# Patient Record
Sex: Male | Born: 1935 | Race: Black or African American | Hispanic: No | Smoking: Former smoker
Health system: Southern US, Community
[De-identification: ages and names within clinical notes are randomized; demographics above are authoritative.]

## PROBLEM LIST (undated history)

## (undated) DIAGNOSIS — M1990 Unspecified osteoarthritis, unspecified site: Secondary | ICD-10-CM

## (undated) DIAGNOSIS — I639 Cerebral infarction, unspecified: Secondary | ICD-10-CM

## (undated) DIAGNOSIS — I10 Essential (primary) hypertension: Secondary | ICD-10-CM

## (undated) DIAGNOSIS — I6529 Occlusion and stenosis of unspecified carotid artery: Secondary | ICD-10-CM

## (undated) DIAGNOSIS — E785 Hyperlipidemia, unspecified: Secondary | ICD-10-CM

## (undated) DIAGNOSIS — F0390 Unspecified dementia without behavioral disturbance: Secondary | ICD-10-CM

## (undated) DIAGNOSIS — I739 Peripheral vascular disease, unspecified: Secondary | ICD-10-CM

## (undated) DIAGNOSIS — N400 Enlarged prostate without lower urinary tract symptoms: Secondary | ICD-10-CM

## (undated) DIAGNOSIS — K429 Umbilical hernia without obstruction or gangrene: Secondary | ICD-10-CM

## (undated) DIAGNOSIS — R1310 Dysphagia, unspecified: Secondary | ICD-10-CM

## (undated) HISTORY — DX: Benign prostatic hyperplasia without lower urinary tract symptoms: N40.0

## (undated) HISTORY — DX: Dysphagia, unspecified: R13.10

## (undated) HISTORY — DX: Hyperlipidemia, unspecified: E78.5

## (undated) HISTORY — DX: Peripheral vascular disease, unspecified: I73.9

## (undated) HISTORY — DX: Occlusion and stenosis of unspecified carotid artery: I65.29

## (undated) HISTORY — DX: Umbilical hernia without obstruction or gangrene: K42.9

## (undated) HISTORY — DX: Unspecified osteoarthritis, unspecified site: M19.90

---

## 2001-06-07 ENCOUNTER — Encounter: Payer: Self-pay | Admitting: Family Medicine

## 2001-06-07 ENCOUNTER — Encounter: Admission: RE | Admit: 2001-06-07 | Discharge: 2001-06-07 | Payer: Self-pay | Admitting: Family Medicine

## 2006-12-29 HISTORY — PX: PEG TUBE PLACEMENT: SUR1034

## 2007-06-05 ENCOUNTER — Inpatient Hospital Stay (HOSPITAL_COMMUNITY): Admission: EM | Admit: 2007-06-05 | Discharge: 2007-06-08 | Payer: Self-pay | Admitting: Emergency Medicine

## 2007-06-07 ENCOUNTER — Encounter (INDEPENDENT_AMBULATORY_CARE_PROVIDER_SITE_OTHER): Payer: Self-pay | Admitting: Neurology

## 2007-06-07 ENCOUNTER — Ambulatory Visit: Payer: Self-pay | Admitting: Physical Medicine & Rehabilitation

## 2007-06-08 ENCOUNTER — Inpatient Hospital Stay (HOSPITAL_COMMUNITY)
Admission: RE | Admit: 2007-06-08 | Discharge: 2007-06-11 | Payer: Self-pay | Admitting: Physical Medicine & Rehabilitation

## 2007-06-15 ENCOUNTER — Encounter (HOSPITAL_COMMUNITY)
Admission: RE | Admit: 2007-06-15 | Discharge: 2007-07-15 | Payer: Self-pay | Admitting: Physical Medicine & Rehabilitation

## 2007-07-20 ENCOUNTER — Encounter (HOSPITAL_COMMUNITY)
Admission: RE | Admit: 2007-07-20 | Discharge: 2007-08-19 | Payer: Self-pay | Admitting: Physical Medicine & Rehabilitation

## 2007-07-21 ENCOUNTER — Ambulatory Visit: Payer: Self-pay | Admitting: Physical Medicine & Rehabilitation

## 2007-07-21 ENCOUNTER — Encounter
Admission: RE | Admit: 2007-07-21 | Discharge: 2007-09-28 | Payer: Self-pay | Admitting: Physical Medicine & Rehabilitation

## 2007-09-15 ENCOUNTER — Emergency Department (HOSPITAL_COMMUNITY): Admission: EM | Admit: 2007-09-15 | Discharge: 2007-09-15 | Payer: Self-pay | Admitting: Emergency Medicine

## 2007-09-23 ENCOUNTER — Ambulatory Visit: Payer: Self-pay | Admitting: *Deleted

## 2007-10-06 ENCOUNTER — Encounter (INDEPENDENT_AMBULATORY_CARE_PROVIDER_SITE_OTHER): Payer: Self-pay | Admitting: *Deleted

## 2007-10-06 ENCOUNTER — Ambulatory Visit: Payer: Self-pay | Admitting: *Deleted

## 2007-10-06 ENCOUNTER — Inpatient Hospital Stay (HOSPITAL_COMMUNITY): Admission: RE | Admit: 2007-10-06 | Discharge: 2007-10-19 | Payer: Self-pay | Admitting: Vascular Surgery

## 2007-10-06 HISTORY — PX: CAROTID ENDARTERECTOMY: SUR193

## 2007-11-16 ENCOUNTER — Ambulatory Visit (HOSPITAL_COMMUNITY): Admission: RE | Admit: 2007-11-16 | Discharge: 2007-11-16 | Payer: Self-pay | Admitting: *Deleted

## 2007-12-02 ENCOUNTER — Ambulatory Visit: Payer: Self-pay | Admitting: *Deleted

## 2008-06-15 ENCOUNTER — Ambulatory Visit: Payer: Self-pay | Admitting: *Deleted

## 2008-12-14 ENCOUNTER — Ambulatory Visit: Payer: Self-pay | Admitting: *Deleted

## 2009-07-20 ENCOUNTER — Ambulatory Visit: Payer: Self-pay | Admitting: Vascular Surgery

## 2009-11-08 ENCOUNTER — Encounter: Payer: Self-pay | Admitting: Cardiology

## 2009-12-13 ENCOUNTER — Ambulatory Visit: Payer: Self-pay | Admitting: Surgery

## 2011-01-22 ENCOUNTER — Ambulatory Visit: Admit: 2011-01-22 | Payer: Self-pay | Admitting: Vascular Surgery

## 2011-01-22 ENCOUNTER — Ambulatory Visit
Admission: RE | Admit: 2011-01-22 | Discharge: 2011-01-22 | Payer: Self-pay | Source: Home / Self Care | Attending: Vascular Surgery | Admitting: Vascular Surgery

## 2011-01-23 NOTE — Assessment & Plan Note (Signed)
OFFICE VISIT  Robert Barnett, Robert Barnett DOB:  1932/02/11                                       01/22/2011 [id number]  I saw this patient in the office today for continued follow-up of his carotid disease and peripheral vascular disease.  This is a patient who Dr. Amedeo Plenty performed a right carotid endarterectomy on in October of 2008.  I last saw him in December of 2010.  Since I saw him last, he has had no history of stroke, TIAs, expressive or receptive aphasia, or amaurosis fugax.  He denies any history of claudication, rest pain, or nonhealing ulcers.  PAST MEDICAL HISTORY:  Significant for hypertension and hypercholesterolemia.  He had a stroke prior to his right carotid endarterectomy.  SOCIAL HISTORY:  He is widowed.  He has 6 children.  He has a remote history of tobacco use.  REVIEW OF SYSTEMS:  CARDIOVASCULAR:  He has had no chest pain, chest pressure, palpitations or arrhythmias.  He had no significant dyspnea on exertion or orthopnea.  He had no history of DVT or phlebitis. PULMONARY:  He had no productive cough bronchitis, asthma or wheezing. MUSCULOSKELETAL:  He does have a history of arthritis.  PHYSICAL EXAMINATION:  This is a pleasant 77 year old gentleman who appears his stated age.  Blood pressure is 104/68, heart rate is 54, saturation 96%.  Lungs:  Clear bilaterally to auscultation without rales, rhonchi or wheezing.  Cardiovascular exam:  I do not detect any carotid bruits.  He has a regular rate and rhythm.  He has palpable femoral pulses and warm well-perfused feet.  He has no significant lower extremity swelling.  Abdomen:  Soft and nontender with normal pitched bowel sounds.  Neurologic exam:  He has no focal weakness or paresthesias.  I did independently interpret his carotid duplex scan which shows that his right carotid endarterectomy site is widely patent without evidence of restenosis.  He has no significant stenosis on the  left.  Both vertebral arteries are patent with only directed flow.  I have also independently interpreted his arterial Doppler study which shows an ABI of 69% on the right which is stable compared to his last study and a normal ABI of 100% on the left.  He has biphasic Doppler signals in the left foot with monophasic Doppler signals in the right foot.  I have ordered a follow-up carotid duplex scan in 1 year and I will plan on seeing him back in 2 years unless there is any change in his follow- up study in a year.  We will also continue to follow his infrainguinal arterial occlusive disease on the right, however, currently he is asymptomatic.  He knows to call sooner if he has problems.    Judeth Cornfield. Scot Dock, M.D. Electronically Signed  CSD/MEDQ  D:  01/22/2011  T:  01/23/2011  Job:  [id number]

## 2011-01-31 NOTE — Procedures (Unsigned)
CAROTID DUPLEX EXAM  INDICATION:  Follow up right carotid endarterectomy.  HISTORY: Diabetes:  No. Cardiac:  No. Hypertension:  Yes. Smoking:  Previous. Previous Surgery:  Right carotid endarterectomy on 10/06/2007 by Dr. Amedeo Plenty. CV History:  History of right hemispheric stroke. Amaurosis Fugax No, Paresthesias No, Hemiparesis No                                      RIGHT             LEFT Brachial systolic pressure:         113               124 Brachial Doppler waveforms:         Normal            Normal Vertebral direction of flow:        Antegrade         Antegrade DUPLEX VELOCITIES (cm/sec) CCA peak systolic                   77                [id number] ECA peak systolic                   55                49 ICA peak systolic                   64                87 ICA end diastolic                   9                 18 PLAQUE MORPHOLOGY:                                    Heterogeneous PLAQUE AMOUNT:                      None              Mild PLAQUE LOCATION:                                      ICA  IMPRESSION: 1. Patent right carotid endarterectomy site with no right internal     carotid artery stenosis. 2. No hemodynamically significant stenosis of the left internal     carotid artery with plaque formations as described above. 3. No significant change noted when compared to the previous exam on     12/13/2009.  ___________________________________________ Judeth Cornfield. Scot Dock, M.D.  CH/MEDQ  D:  01/23/2011  T:  01/23/2011  Job:  [id number]

## 2011-05-13 NOTE — Op Note (Signed)
Robert Barnett, Robert Barnett NO.:  [id number]   MEDICAL RECORD NO.:  [id number]          PATIENT TYPE:  INP   LOCATION:  [id number]                         FACILITY:  [facility]   PHYSICIAN:  Dorothea Glassman, M.D.    DATE OF BIRTH:  1932/08/25   DATE OF PROCEDURE:  10/06/2007  DATE OF DISCHARGE:                               OPERATIVE REPORT   SURGEON:  P Cameron Sprang, MD   ASSISTANT:  Judeth Cornfield. Scot Dock, M.D.   ANESTHETIC:  General endotracheal.   ANESTHESIOLOGIST:  Lorrene Reid, M.D.   PREOPERATIVE DIAGNOSIS:  1. Moderate to severe right internal carotid artery stenosis.  2. Status post nondisabling right hemispheric cerebrovascular      accident.   POSTOPERATIVE DIAGNOSIS:  (same)   CLINICAL NOTE:  Robert Barnett is a 77 year old male who suffered a non  disabling cerebrovascular accident in June of this year.  Initially  presented with aphasia and left hemiparesis.  Recovered extremely well  from this with a functional recovery including speech and ambulation.  He does have an MRI positive for stroke in his right brain.  Workup  revealed a moderate to severe right internal carotid artery stenosis.  He has undergone an arteriogram which verified extensive plaque in his  right carotid bifurcation with a moderate to severe stenosis.  He was  seen in consultation and was recommended he undergo right carotid  endarterectomy for reduction of stroke risk.  He consented for surgery.  Brought to the operating at this time for planned procedure.  Potential  risks of the operative procedure have been reviewed with the patient and  his family with a major morbidity mortality 1-2% to include but not  limited MI, CVA, cranial nerve injury and death.   OPERATIVE PROCEDURE:  The patient brought to the operating room stable  hemodynamic condition.  Placed under general endotracheal anesthesia.  Right neck prepped and draped in sterile fashion.  Foley catheter and  arterial line in  place.   Curvilinear skin incision made along the anterior border of the right  sternomastoid muscle.  Dissection carried down through subcutaneous  tissue with electrocautery.  Deep dissection carried down through the  platysma.  Further dissection carried along the anterior border of the  sternomastoid to expose the common carotid artery.  Common carotid  artery freed and encircled with vessel loop at the omohyoid muscle.  The  vagus nerve reflected posteriorly and preserved.  The common carotid  artery mobilized up to the origin of the superior thyroid and external  carotid which were freed and encircled with vessel loops.  The internal  carotid artery was severely diseased with plaque.  A large plaque was  noted at the origin of right internal carotid artery.  This was freed  and mobilized up to the posterior belly of the digastric muscle.  The  hypoglossal nerve was retracted superiorly and preserved.  The distal  internal carotid artery encircled with vessel loop. The plaque extended  quite high in the right ICA.   The patient administered 7000 units heparin intravenously.  Adequate  circulation time permitted.  The carotid vessels controlled with clamps.  Longitudinal arteriotomy made in the distal common carotid artery.  The  arteriotomy extended across the carotid bulb up to the internal carotid  artery.  There was recent hemorrhage in the plaque.  Extensive plaque at  the origin of right internal carotid artery with a moderate-to-severe  stenosis.  Shunt was then inserted.   An endarterectomy elevator used to remove the plaque.  The  endarterectomy carried down to the common carotid artery and plaque was  cut transversely with Potts scissors.  Plaque then raised up in the bulb  and the superior thyroid and external carotid were endarterectomized  using an eversion technique.  The distal internal carotid artery plaque  feathered out well.  The fragments plaque removed with  plaque forceps.  The site irrigated with heparin saline solution and Dextran.  A patch  angioplasty endarterectomy site carried out with a running 6-0 Prolene  suture using a Finesse Dacron patch.  At completion of the patch  angioplasty shunt was removed.  All vessels well flushed.  Clamps  removed directing initial antegrade flow up the external carotid artery.  Following this the internal carotid was released.   Excellent pulse and Doppler signal in the distal internal carotid  artery.   Adequate hemostasis obtained.  Sponge and instrument counts correct.  The patient administered 50 mg protamine intravenously.   Sternomastoid fascia closed with running 2-0 Vicryl suture.  Platysma  closed with running 3-0 Vicryl suture.  Skin closed with 4-0 Monocryl.  Dermabond applied.   Anesthesia reversed in the operating room.  The patient awakened  readily.  No apparent complications.  Transferred to recovery room in  stable condition.      Dorothea Glassman, M.D.  Electronically Signed     PGH/MEDQ  D:  10/06/2007  T:  10/07/2007  Job:  [id number]   cc:   Pramod P. Leonie Man, MD  Delphina Cahill, M.D.

## 2011-05-13 NOTE — Procedures (Signed)
CAROTID DUPLEX EXAM   INDICATION:  Followup carotid artery disease.   HISTORY:  Diabetes:  No.  Cardiac:  No.  Hypertension:  Yes.  Smoking:  Previous.  Previous Surgery:  Yes.  Right carotid endarterectomy with Dacron patch  on 10/06/2007 by Dr. Amedeo Plenty.  CV History:  Yes, previous CVA.  Amaurosis Fugax No, Paresthesias No, Hemiparesis No                                       RIGHT             LEFT  Brachial systolic pressure:  Brachial Doppler waveforms:  Vertebral direction of flow:        Antegrade         Antegrade  DUPLEX VELOCITIES (cm/sec)  CCA peak systolic                   95                [id number]  ECA peak systolic                   76                56  ICA peak systolic                   89                [id number]  ICA end diastolic                   24                30  PLAQUE MORPHOLOGY:                                    Heterogeneous  PLAQUE AMOUNT:                                        Mild  PLAQUE LOCATION:                                      ICA   IMPRESSION:  1. No right internal carotid artery stenosis status post carotid      endarterectomy.  2. 0%-39% stenosis of the left internal carotid artery.  3. Antegrade flow in bilateral vertebrals.   ___________________________________________  Judeth Cornfield. Scot Dock, M.D.   CB/MEDQ  D:  12/13/2009  T:  12/13/2009  Job:  [id number]

## 2011-05-13 NOTE — Op Note (Signed)
NAMERIGDON, RECCHIA NO.:  [id number]   MEDICAL RECORD NO.:  [id number]          PATIENT TYPE:  INP   LOCATION:  2039                         FACILITY:  [facility]   PHYSICIAN:  Jeryl Columbia, M.D.    DATE OF BIRTH:  28-Nov-1932   DATE OF PROCEDURE:  10/15/2007  DATE OF DISCHARGE:                               OPERATIVE REPORT   PROCEDURE:  EGD with PEG placement.   INDICATIONS:  CVA with decreased swallowing.  Consent was signed after  risks, benefits, methods, options thoroughly discussed yesterday with  multiple family members and the patient.   MEDICINES USED:  Fentanyl 75 mcg, Versed 7.5 mg.   PROCEDURE:  The video endoscope was inserted by direct vision following  the feeding tube into his esophagus.  The feeding tube was removed.  He  did have a small hiatal hernia.  The rest of the esophagus was normal.  The scope was inserted into the stomach.  Some mild gastritis was seen.  The scope was advanced through a normal antrum and normal pylorus into a  normal duodenal bulb and around the C-loop to a normal second portion of  the duodenum.  The scope was withdrawn back to the stomach and  retroflexed.  The cardia, fundus, angularis, lesser and greater curve  were all normal on retroflex visualization.  Straight visualization of  the stomach did not reveal any additional findings.  We went ahead and  slowly withdrew back to 20 cm, again confirming a normal esophagus.  The  scope was then advanced to the distal greater curve.  The light reflex  was seen in the midepigastric area.  One-to-one finger palpation was  confirmed endoscopically.  While the abdomen was sterilely dressed and  draped, the snare was advanced through the scope.  Imogene Burn, our  P.A., injected 1% lidocaine to raise the customary wheal, made a small  less than a centimeter incision.  The introducer needle was advanced on  the first try into the stomach and grabbed with the snare.  The blue  Ponsky pull cord was advanced through the introducer, grabbed with the  snare, and the scope, snare, and cord were withdrawn through the  patient's mouth.  A 24-French [employer] Ponsky-stype pull peg  was attached to the cord and withdrawn through the abdominal incision in  the customary fashion to approximately the 4-cm mark.  While the bumper  was being placed, the scope was reinserted.  There was no trauma to the  esophagus.  The proper tension of the bumper and placement of the bumper  was confirmed endoscopically and by turning the PEG.  Air was suctioned,  the scope removed, and the dressing was applied.   The patient tolerated the procedure well.  There was no obvious  immediate complication.   ENDOSCOPIC DIAGNOSES:  1. Small hiatal hernia.  2. Minimal gastritis.  3. Otherwise normal esophagogastroduodenoscopy therapy.  24-French      [employer] pull peg placed without complications.   PLAN:  Customary post PEG orders.  Once he is at the goal rate and  continuous, would switch him to bolus which will probably be best for  him at home.  Will go ahead and add pump inhibitors.  Happy to see back  in the office p.r.n. when swallowing returns to decide the appropriate  timing of when to pull the PEG out.  Otherwise, happy to see back  periodically to check on the PEG or see sooner p.r.n. signs of problems.           ______________________________  Jeryl Columbia, M.D.     MEM/MEDQ  D:  10/15/2007  T:  10/17/2007  Job:  [id number]   cc:   Dorothea Glassman, M.D.  Delphina Cahill, M.D.  Pramod P. Leonie Man, MD

## 2011-05-13 NOTE — Assessment & Plan Note (Signed)
OFFICE VISIT   Robert Barnett, Robert Barnett  DOB:  08-27-1932                                       06/15/2008  [id number]   The patient returned to the office today for followup of his carotid  disease.  He had a right carotid endarterectomy performed in October of  2008 for nondisabling right hemispheric CVA.  This was complicated by  prolonged dysphagia with aspiration requiring PEG tube placement.  Fortunately, he developed complete recovery and his swallowing returned  to normal.  His PEG tube was removed and he has been doing well since  that time.   His carotid Dopplers reveal widely patent right carotid endarterectomy  site and minimal left carotid disease.  Mild left carotid plaque noted.  Antegrade vertebral flow present bilaterally.   The patient appears generally well.  He is alert and oriented.  BP is  125/73, pulse is 54 per minute.  No carotid bruits.  Cranial nerves  intact.  Strength equal bilaterally.  2+ reflexes.     His daughter has expressed some concern about his lower extremity  symptoms.  He does describe some calf claudication bilaterally.  Has no  night pain or rest pain.   His lower extremity Dopplers do reveal some disease in the right side  with ankle brachial index noted to be 0.71 on the right and 1 on the  left.  He has good biphasic tibial wave forms on the left and monophasic  on the right.   Highly likely the patient does have some iliac disease on the right,  symptoms are mild to moderate at present.  We will plan no further  intervention.  Plan to follow up with him in 6 months with repeat  carotid Doppler and lower extremity Dopplers.   Dorothea Glassman, M.D.  Electronically Signed   PGH/MEDQ  D:  06/15/2008  T:  06/16/2008  Job:  [id number]   cc:   Delphina Cahill, M.D.

## 2011-05-13 NOTE — Consult Note (Signed)
NEW PATIENT CONSULTATION   SIERRA, WENZLICK  DOB:  24-Aug-1932                                       09/23/2007  [id number]   REASON FOR CONSULTATION:  Moderate-to-severe right internal carotid  artery stenosis, status post right hemispheric cerebrovascular accident.   HISTORY:  Patient is a 77 year old African-American gentleman who was  admitted to [facility] on June 05, 2007 following the acute  onset of left hemiparesis and slurred speech.  Workup under code stroke  revealed a large right hemispheric infarct, and he was given two puff  doses of IV t-PA.  Angiography revealed an ulcerative plaque in the  proximal right internal carotid artery with fresh thrombus and a 60-70%  stenosis.  Patient received antiplatelet therapy with aspirin and  Plavix.  Made an excellent recovery and received inpatient rehab for  approximately a week.  He is now ambulatory.  He has minimal deficits.  He is independent in daily activities.   PAST MEDICAL HISTORY:  1. Hypertension.  2. Hyperlipidemia.  3. BPH.   MEDICATIONS:  1. Plavix 75 mg daily.  2. Aspirin 81 mg daily.  3. Zocor 20 mg daily.  4. Flomax 0.4 mg daily.  5. Chlorothiazide 25 mg daily.  6. Lotrel 10/20 mg daily.   ALLERGIES:  None known.   SOCIAL HISTORY:  The patient is retired.  He has six children.  He is a  widower.  He lives independently.  He does not consume alcohol or use  tobacco products.   REVIEW OF SYSTEMS:  Refer to patient encounter form.  No weight change.  He denies anorexia.  No fevers or chills.  No chest pain.  He denies  shortness of breath, cough, or sputum production.  No change in bowel  habits.  No dysuria or frequency.   FAMILY HISTORY:  Mother is living at age 41.  Father died at age 21,  little details of his death.  He has six siblings, one brother and five  sisters who are generally well.  No family history of diabetes, stroke,  or MI.   PHYSICAL  EXAMINATION:  A generally well-appearing African-American  gentleman.  Alert and oriented.  Vital signs:  BP 121/74 left arm,  130/73 right arm, pulse 66 per minute.  HEENT:  Mouth and throat clear.  Normocephalic.  Extraocular movements are intact.  Neck:  Supple.  No  thyromegaly or adenopathy.  Chest:  Clear with equal air entry  bilaterally.  No rales or rhonchi.  Cardiovascular:  Normal heart  sounds.  No murmurs.  No carotid bruits.  Abdomen is soft and nontender.  No masses or organomegaly.  Lower extremities:  DP 1+ bilaterally.  No  ankle edema.  Neurologic:  Patient is alert and oriented.  Speech is  clear.  Cranial nerves intact.  Strength equal bilaterally.  Gait is  slow with mild left foot dragging.   IMPRESSION:  1. Moderate-to-severe right internal carotid artery stenosis, status      post large right hemispheric cerebrovascular accident.  Good      functional recovery.  2. Hypertension.  3. Hyperlipidemia.  4. Benign prostatic hypertrophy.   RECOMMENDATIONS:  Right carotid endarterectomy for reduction of stroke  risk.  I have discussed my recommendations with the patient and the  details of surgery.  Risks and benefits of the  operative procedure  reviewed.  He is at high risk for recurrent stroke.  Potential risks of  the operative procedure, MI, CVA, cranial nerve injury, and death with a  rate of 1-2%.  Patient is hesitant to consent for surgery at this time.  He does wish to think about this and will discuss this further with his  family.  His daughter was with him today.  I have also spoken to his  daughter,  Freda Munro, in [place].  I will await their final decision.  I have given  them my card and a contact number for July Keck, scheduling nurse.   Dorothea Glassman, M.D.  Electronically Signed   PGH/MEDQ  D:  09/23/2007  T:  09/24/2007  Job:  [id number]   cc:   Biagio Borg, MD  Delphina Cahill, M.D.  Pramod P. Leonie Man, MD

## 2011-05-13 NOTE — Assessment & Plan Note (Signed)
OFFICE VISIT   Robert Barnett, Robert Barnett  DOB:  [birth year]                                       12/14/2008  [id number]   The patient underwent a right carotid endarterectomy in October 2008.  This was followed by prolonged dysphagia, requiring placement of a PEG  tube.  He developed complete recovery of his swallowing and now has no  complaints.  The PEG tube has been removed some time ago.   Presents at this time for routine carotid follow-up.  No complaints of  visual disturbance, sensory motor deficit.  No speech problems.   His right carotid endarterectomy site is widely patent by Doppler.  Mild  stenosis on the left side.   Lower extremity Dopplers were also carried out at this time, indicating  ABIs of 0.63 on the right and 0.81 on the left.  He has no leg pain with  ambulation.   PHYSICAL EXAMINATION:  The patient appears well.  Alert and oriented.  No acute distress.  Blood pressure is 140/70, pulse is 68 per minute.  No carotid bruits.  Cranial nerves intact.  Strength equal bilaterally.  Lower Extremities:  Reveal some psoriasis changes of the skin.  No  palpable pedal pulses.  Feet are well-perfused without ischemic changes.   Overall, the patient is doing well.  Will plan follow-up again in 1 year  with lower extremity Dopplers and a carotid Doppler.   Dorothea Glassman, M.D.  Electronically Signed   PGH/MEDQ  D:  12/14/2008  T:  12/15/2008  Job:  [id number]   cc:   Delphina Cahill, M.D.

## 2011-05-13 NOTE — Discharge Summary (Signed)
NAMEALVIE, Robert Barnett NO.:  [id number]   MEDICAL RECORD NO.:  [id number]          PATIENT TYPE:  INP   LOCATION:  2039                         FACILITY:  [facility]   PHYSICIAN:  Dorothea Glassman, M.D.    DATE OF BIRTH:  02-Oct-1932   DATE OF ADMISSION:  10/06/2007  DATE OF DISCHARGE:  10/19/2007                               DISCHARGE SUMMARY   ADMISSION DIAGNOSES:  1. Moderate to severe right internal carotid artery stenosis.  2. Status post nondisabling right hemispheric cerebrovascular      accident.   DISCHARGE DIAGNOSES/SECONDARY DIAGNOSES:  1. Moderate to severe right internal carotid artery stenosis.  2. Status post nondisabling right hemispheric cerebrovascular      accident.  3. Hypercholesterolemia with no history of diabetes.  4. Dysphagia, secondary to nerve rotation during right carotid      endarterectomy.  5. Hypertension.  6. Benign prostatic hypertrophy.  7. Umbilical hernia.  8. Right middle cerebral artery cerebrovascular accident, status post      tissue plasminogen activator.  9. Dyslipidemia.   CONSULTING PHYSICIAN:  Consults were with:  1. Speech therapy.  2. ENT:  Dr. Crissie Reese.  3. GI:  Dr. Clarene Essex.  4. Nutrition.   PROCEDURE:  1. On October 06, 2007, right carotid endarterectomy with Dacron patch      angioplasty by Dr. Gordy Clement.  2. On October 15, 2007, PEG placed by GI, Dr. Watt Climes.  3. On October 9, 13 and 16, 2008, FEES studies done.   HISTORY AND PHYSICAL:  This is [employer].  This is a 77 year old male  who suffered a nondisabling cerebrovascular accident in June of this  year.  Initially he presented with aphasia and left hemiparesis.  Recovered extremely well from this with a functional recovery including  speech and ambulation.  He does not have an MRI positive for stroke in  his right brain.  Workup revealed a moderate to severe right internal  carotid artery stenosis.  He has undergone an arteriogram which  verified  extensive plaque in his right carotid, bifurcation with a moderate to  severe stenosis.  He was seen in consultation and it was recommended he  undergo a right carotid endarterectomy for reduction of stroke.  He  consented for surgery.  Brought to operating room at this time for  planned procedure and he agreed to procedure.   HOSPITAL COURSE:  He was electively admitted on October 06, 2007 for a  right carotid endarterectomy.  Postoperative noticed he was having  dysarthria and dysphagia, felt that this was related to nerve retraction  during surgery as during the operative bonding showed plaque was very  high.  Speech therapy was consulted.  Three FEES studies failed even  though he was showing slight improvement with clearing secretions.  Short-term PEG placement because of dysphagia was placed by GI.  Prior  to PEG tube, ENT consultation and MRI with no evidence of acute stroke.  ENT and GI recommended a PEG because of the persistent dysphagia.  In  the meantime, until PEG placement was done on  October 15, 2007, tube  feeding was indicated via panda tube.  At time of dictation,  transitioning him to bolus tube feedings to goal rate.  Note patient  initially started on Osmolite, but because of hyperglycemia, started on  a NovoLog insulin sliding scale and changed to Jevity in hopes he will  have improved glucose control.  Ordered hemoglobin A1C, results still  pending.  Consult to follow and make further recommendations at  discharge if needed.  Currently the patient remains neurologically  intact except with previously mentioned dysphagia.  Vitals stable,  afebrile.  Most recent labs:  His white blood count is 8.5, his  hemoglobin 11.5, hematocrit 33.7, and his platelets are 240,000.  On his  sodium is 142, his chloride is 104, his BUN is at 12, his potassium is  at 3.4, bicarb at 31, creatinine at 1.12 and his glucose is at 123,  calcium 9.2.  Input is 840 and output is a  positive 300, and weight  109.6 kg.  CBG 156.  If no significant change in status and tolerating  bolus, plan to discharge on October 19, 2007 to home with home health  nursing care in place.  Currently the patient remains in stable  condition.   DISCHARGE MEDICATIONS:  1. Simvastatin 20 mg daily.  2. Hydrochlorothiazide 25 mg daily.  3. Aspirin 81 mg daily.  4. Flomax 0.4 mg.  5. Plavix 75 mg.  6. Amlodipine 10/20.  7. Protonix 20 mL daily by tube.  8. Jevity 1.2 kcal 340 mL five times daily.   DISCHARGE INSTRUCTIONS:  On discharge, the patient was instructed to  clean PEG site with soap and water daily.  Increase activity slowly.  Walk with assistance and walk up steps.  He may sponge bathe.  No  lifting or driving for two weeks.  He is to have a follow-up appointment  in one month with Dr. Gordy Clement.  He is to call Dr. Watt Climes at  telephone number [phone] if abdominal pain, redness or drainage from  PEG site.  Home health nursing with advanced home care is in place for  patient.  He is to continue medications per his home reconciliation  sheet and crush medications and give through the PEG tube.  He is to  have a follow-up FEES study on November 16, 2007 at 11:30 a.m. and he is  to please arrive in short-stay 15 minutes early.  He is to follow-up  with Dr. Gordy Clement in one month on November 11, 2007.  Currently  the patient remains in stable condition.      Darlin Coco, PA      P. Drucie Opitz, M.D.  Electronically Signed    ALW/MEDQ  D:  10/18/2007  T:  10/19/2007  Job:  [id number]

## 2011-05-13 NOTE — Discharge Summary (Signed)
NAMEDEVANTE, Robert Barnett NO.:  [id number]   MEDICAL RECORD NO.:  [id number]          PATIENT TYPE:  IPS   LOCATION:  4036                         FACILITY:  [facility]   PHYSICIAN:  Jarvis Morgan, M.D.   DATE OF BIRTH:  [birth year]   DATE OF ADMISSION:  06/08/2007  DATE OF DISCHARGE:  06/11/2007                               DISCHARGE SUMMARY   DISCHARGE DIAGNOSES:  1. Right middle cerebral artery infarction.  2. Right internal carotid artery stenosis.  3. Hypertension.  4. Hyperlipidemia.  5. Benign prostatic hypertrophy.  6. Subcutaneous Lovenox for deep vein thrombosis prophylaxis.   HISTORY:  A 77 year old left handed black male who was admitted June 7  with left-sided weakness and slurred speech.  Cranial CT scan was  negative.  TPA was initiated.  MRI showed a right middle cerebral artery  multi-infarction.  Cerebral arteriogram showed ulcerated plaque, right  internal carotid artery, associated stenosis of 65% to 70% that was  nonobstructive.  Dr. Estanislado Pandy of Interventional Radiology felt at  present no intervention was needed.  Echocardiogram was pending.  Neurology consulted, placed on aspirin and Plavix therapy.  Plan was for  outpatient evaluation for possible right carotid surgery.  Bouts of  bradycardia and Bystolic was stopped June 08, 2007.   PAST MEDICAL HISTORY:  See discharge diagnoses.   HABITS:  No alcohol or tobacco.   ALLERGIES:  None.   SOCIAL HISTORY:  Retired, widowed, lives alone, multiple family members  to assist as needed.   MEDICATIONS PRIOR TO ADMISSION:  1. Bystolic 5 mg daily.  2. Lotrel 10/20 daily.  3. Etodolac 40 mg daily.  4. Flomax 0.4 mg daily.  5. Aspirin 81 mg daily.  6. Hydrochlorothiazide 25 mg daily.   REHABILITATION HOSPITAL COURSE:  The patient was admitted to inpatient  rehab services with therapies initiated on a 3-hour-daily basis  consisting of physical therapy, occupational therapy, speech therapy and  rehabilitation nursing.  The following issues were addressed during the  patient's rehabilitation stay:  Pertaining Mr. Moctezuma's right cerebral  artery infarction, he remained on aspirin and Plavix therapy.  He was  now independent in his room.  He was advised no dressing and no driving.  The patient did receive tPA during his evaluation for cerebrovascular  accident.  Findings of right internal carotid artery stenosis of 65% to  70%; he would follow up with outpatient, Dr. Leonie Man, for evaluation and  workup for possible carotid surgery.  Blood pressures were monitored on  Lotrel and hydrochlorothiazide; his Bystolic had been discontinued due  to some bradycardia.  He had no chest pain or shortness of breath during  his therapies.  He was placed on Zocor for hyperlipidemia.  He remained  on his Flomax for benign prostatic hypertrophy.  Overall, for his  functional status, he was independent with handheld assistance for his  mobility, independent for activities of daily living, needing some  monitoring for balance.  Overall, his strength and endurance had greatly  improved.  He was encouraged with his overall progress and discharged to  home.   DISCHARGE MEDICATIONS  AT TIME OF DICTATION:  1. Plavix 75 mg daily.  2. Zocor 20 mg daily.  3. Aspirin 81 mg daily.  4. Flomax 0.4 mg at bedtime.  5. Hydrochlorothiazide 25 mg daily.  6. Lotrel 10/20 daily.   DIET:  Regular.   FOLLOWUP:  He would follow up with Dr. Jarvis Morgan at the outpatient  rehab service office as advised, Dr. Meade Maw, medical management,  Dr. Leonie Man for evaluation and completion for possible carotid surgery.      Lauraine Rinne, P.A.    ______________________________  Jarvis Morgan, M.D.    DA/MEDQ  D:  06/11/2007  T:  06/11/2007  Job:  [id number]   cc:   Delphina Cahill, M.D.  Antony Contras, M.D.

## 2011-05-13 NOTE — Assessment & Plan Note (Signed)
OFFICE VISIT   BACHIR, MABLE  DOB:  04-23-1932                                       12/02/2007  [id number]   The patient underwent a right carotid endarterectomy on 10/06/2007 at  [facility].  This was a technically difficult procedure due to  the high nature of the lesion and was complicated by dysphagia and voice  changes.  He had aspiration on swallowing test, requiring placement of a  G tube.   He underwent a swallowing evaluation prior to his visit today.  He is  now eating normally.  His voice has returned to normal.   BP 145/67, pulse 56, respirations 18.  Right neck incision healing  unremarkably.  Cranial nerves intact.  Strength equal bilaterally.   G tube was removed today.  No complications associated with this.   The patient is doing very well following his surgery.  I will plan to  follow up with him again in 6 months with a carotid Doppler evaluation.   Dorothea Glassman, M.D.  Electronically Signed   PGH/MEDQ  D:  12/02/2007  T:  12/03/2007  Job:  [id number]

## 2011-05-13 NOTE — H&P (Signed)
Robert Barnett, Robert Barnett NO.:  [id number]   MEDICAL RECORD NO.:  [id number]          PATIENT TYPE:  INP   LOCATION:  5123                         FACILITY:  [facility]   PHYSICIAN:  Jarvis Morgan, M.D.   DATE OF BIRTH:  03/29/1932   DATE OF ADMISSION:  06/05/2007  DATE OF DISCHARGE:                              HISTORY & PHYSICAL   PRIMARY CARE PHYSICIAN:  Delphina Cahill, M.D.   NEUROLOGIST:  Dr. Leonie Man.   HISTORY OF THE PRESENT ILLNESS:  The patient is a 77 year old left-  handed African American male with a history of hypertension.  He was  admitted 06/05/2007 with acute onset of left-sided weakness and slurred  speech.  Cranial CT was initially negative and t-PA was initiated.  MRI  studies showed multiple infarcts in the right middle cerebral artery  distribution.  Cerebral arteriogram showed ulcerative plaques in the  right internal carotid artery associated with stenosis of 65-70% which  was nonobstructive.  Dr. Simona Huh from Interventional Radiology said no  present intervention was necessary.  Echocardiogram is pending.  Dr.  Leonie Man saw the patient from Neurology and started the patient on Plavix  and aspirin.  The plan is for an outpatient evaluation for possible  right carotid surgery.  He has had bouts of bradycardia and his home  Bistolic medication was discontinued.  The patient was evaluated by the  Rehabilitation physicians and felt to be an appropriate candidate for  inpatient rehabilitation.   REVIEW OF SYSTEMS:  Positive for reflux.   PAST MEDICAL HISTORY:  1. Hypertension.  2. Benign prostatic hypertrophy.  3. Umbilical hernia.   FAMILY HISTORY:  Positive for coronary artery disease.   SOCIAL HISTORY:  The patient is widowed and retired.  He lives alone but  has multiple family members who can assist him at home as necessary.  He  lives in a 1-level home.  He does not use alcohol or tobacco.   FUNCTIONAL HISTORY:  Prior to admission,  independent.   ALLERGIES:  NO KNOWN DRUG ALLERGIES.   MEDICATIONS PRIOR TO ADMISSION:  Bistolic 5 mg, Lotrel [id number] 1 q.day,  etodolac 40 mg q.day, Flomax 0.4 mg q.day, aspirin 81 mg q.day,  hydrochlorothiazide 25 mg q.day.   LABORATORY:  Recent hemoglobin was 14.1 with hematocrit of 42, platelet  count of 146,000 and a white count of 8.9.  Recent hemoglobin A1C was  6.5.  Total cholesterol was 167.  Sodium was 140, potassium 3.9,  chloride 111, CO2 25, BUN 12 and creatinine 1.01.   PHYSICAL EXAM:  GENERAL:  Well-appearing large adult male lying in bed  in no acute discomfort.  VITAL SIGNS:  Blood pressure 139/67 with a pulse of 50, respiratory rate  18 and temperature 98.2.  HEENT:  Normocephalic, not traumatic.  CARDIOVASCULAR:  Regular rate and rhythm.  S1-S2 without murmurs.  ABDOMEN:  Soft, obese, nontender with positive bowel sounds.  LUNGS:  Clear to auscultation bilaterally.  NEUROLOGIC:  Alert and oriented times 3.  Cranial nerves II through XII  intact.  Right upper and right lower extremity strength were 5/5  throughout.  Bulk and tone were normal.  Sensation was intact to light  touch throughout the right upper and right lower extremity.  Left upper  extremity strength was approximately 4-/5 and left lower extremity  strength was 4-/5 with intact sensation and normal bulk and tone.   IMPRESSION:  1. Status post right middle cerebral artery infarct with left-sided      weakness, arm and leg.  2. Right internal carotid artery stenosis with further direct      visualization for carotid endarterectomy as an outpatient.  3. Dyslipidemia.  4. Hypertension.  5. Benign prostatic hypertrophy.   Presently, the patient has deficits in ADL's, transfers, ambulation and  higher level cognition related to his above-noted right middle cerebral  artery multiple infarcts.   PLAN:  Admit to the rehabilitation unit for daily therapies to include  physical therapy for range of motion,  strength, bed mobility, transfers,  pre-gait training, gait training and equipment eval.  Occupational  therapy for range of motion, strengthening, ADL's, cognitive/perceptual  training, splinting and equipment eval.  Rehab nursing for skin care,  wound care and bowel and bladder training as necessary.  Speech therapy  for higher-level communication.  Case management to assess home  environment and assist with discharge planning and arrange for  appropriate follow-up care.  Social worker to assess family and social  support and assist in discharge planning.  Continue regular diet.  Check  CBG's, a.c. and nightly and discontinue if within normal limits.  Continue aspirin 81 mg p.o. q.day along with Plavix 75 mg p.o. q.day.  Sub-cu Lovenox 40 mg q.day for DVT prophylaxis.  Monitor hypertension on  Norvasc 10 mg p.o. q.day, Lotensin 20 mg p.o. q.day, and  hydrochlorothiazide 25 mg p.o. q.day.  Continue Flomax 0.4 mg p.o.  nightly for urinary retention and hesitancy.  Continue Zocor 20 mg p.o.  q.day for dyslipidemia.  DC IV heparin if not already done.  DC IV  fluids if not already done.  Routine turning to prevent skin breakdown.  Dulcolax suppository 1 per rectum q.day p.r.n.  Check admission labs  including CBC and CMET Wednesday, 06/09/2007.   PROGNOSIS:  Good.   ESTIMATED LENGTH OF STAY:  Seven to 10 days.   GOALS:  Modified independent, ADL's, transfers and ambulation.           ______________________________  Jarvis Morgan, M.D.     DC/MEDQ  D:  06/08/2007  T:  06/08/2007  Job:  [id number]

## 2011-05-13 NOTE — Procedures (Signed)
CAROTID DUPLEX EXAM   INDICATION:  Follow-up evaluation on known carotid artery disease.   HISTORY:  Diabetes:  No.  Cardiac:  No.  Hypertension:  Yes.  Smoking:  No.  Previous Surgery:  Right carotid endarterectomy with Dacron patch  angioplasty on October 06, 2007, by Dr. Amedeo Plenty.  CV History:  Patient reports no cerebrovascular symptoms at this time.  No right ICA stenosis status post endarterectomy.  A 20-39% left ICA  stenosis.  Amaurosis Fugax No, Paresthesias No, Hemiparesis No.                                       RIGHT             LEFT  Brachial systolic pressure:         155               151  Brachial Doppler waveforms:         Triphasic         Triphasic  Vertebral direction of flow:        Antegrade         Antegrade  DUPLEX VELOCITIES (cm/sec)  CCA peak systolic                   55                93  ECA peak systolic                   47                49  ICA peak systolic                   28                81  ICA end diastolic                   7                 17  PLAQUE MORPHOLOGY:                  None              Mixed  PLAQUE AMOUNT:                      None              Mild  PLAQUE LOCATION:                    None              Proximal ICA   IMPRESSION:  1. No right ICA stenosis status post endarterectomy.  2. A 20-39% left ICA stenosis.   ___________________________________________  P. Drucie Opitz, M.D.   MC/MEDQ  D:  12/14/2008  T:  12/14/2008  Job:  [id number]

## 2011-05-13 NOTE — Procedures (Signed)
CAROTID DUPLEX EXAM   INDICATION:  Followup of known carotid artery disease.  The patient is  currently asymptomatic.   HISTORY:  Diabetes:  No.  Cardiac:  No.  Hypertension:  Yes.  Smoking:  No.  Previous Surgery:  Right CEA with DPA on 10/06/2007 by Dr. Amedeo Plenty.  CV History:  Right hemispheric CVA with residual left-sided numbness.  Amaurosis Fugax No, Paresthesias No, Hemiparesis No                                       RIGHT             LEFT  Brachial systolic pressure:         122               130  Brachial Doppler waveforms:         Biphasic          Biphasic  Vertebral direction of flow:        Antegrade         Antegrade  DUPLEX VELOCITIES (cm/sec)  CCA peak systolic                   75                56  ECA peak systolic                   193               54  ICA peak systolic                   70 (D)            70  ICA end diastolic                   20                17  PLAQUE MORPHOLOGY:                  N/A               Heterogenous  PLAQUE AMOUNT:                      N/A               Mild  PLAQUE LOCATION:                    ECA               ICA   IMPRESSION:  1. Right ICA without recurrent stenosis status post CEA.  2. Left 1-39% ICA stenosis.  3. Right ECA stenosis.  4. Bilateral antegrade flow in vertebral arteries.   ___________________________________________  P. Drucie Opitz, M.D.   PB/MEDQ  D:  06/15/2008  T:  06/15/2008  Job:  [id number]

## 2011-05-13 NOTE — H&P (Signed)
NAMENATHANUAL, RASK NO.:  [id number]   MEDICAL RECORD NO.:  [id number]          PATIENT TYPE:  EMS   LOCATION:  MAJO                         FACILITY:  [facility]   PHYSICIAN:  Flint Melter. Jacolyn Reedy, M.D.DATE OF BIRTH:  [birth year]   DATE OF ADMISSION:  06/05/2007  DATE OF DISCHARGE:                              HISTORY & PHYSICAL   Code stroke was called at 3:36 p.m.  The patient's symptom onset was at  2:30 p.m., and time of the neuro initial exam was 4 p.m.   CHIEF COMPLAINT:  Left sided weakness and slurred speech, onset about  2:30 p.m.   HISTORY OF PRESENT ILLNESS:  Robert Barnett is a 77 year old, left-handed,  black male with a history of hypertension, who lives alone and is  independent.  This morning, he told his daughter that he felt funny, but  nonfocal, no specific abnormalities.  He went to his grandson's  graduation, and at one point complained of tingling of both hands, but  once again, nothing specific or unilateral.  Later this afternoon, he  was leaving the house to go a barbeque with his daughters when he  suddenly developed a left hemiparesis and slurred speech, and 911 was  called.   REVIEW OF SYSTEMS:  Positive for the tingling of the hands.  There is no  chest pain, no shortness of breath.  He had no GI or GU complaints.  He  had complained that his legs were tired.  No endocrine, reproductive,  hematologic, allergic, immunologic or psychiatric symptoms were  reported.   PAST MEDICAL HISTORY:  Significant for:  1. Hypertension.  2. Obesity.  3. Cirrhoses.  4. Umbilical hernia.  5. Benign prosthetic hypertrophy.   No previous history of stroke.  No history of diabetes or  hyperlipidemia.  He gets his care at [facility]   MEDICATIONS:  1. Bystolic 5 mg 1 a day.  2. Lotrel 10/20 one a day.  3. Etodolac 40 mg, I am not sure how many times a day.  4. Tylenol as needed.  5. Flomax 0.4 mg once a day.  6.  Hydrochlorothiazide 25 mg.  7. Aspirin once a day, I do not know if it was 81 or 325.   ALLERGIES:  No known drug allergies.   SOCIAL HISTORY:  He is a retired Programme researcher, broadcasting/film/video.  Lives by  himself.  Has several children.  He does not smoke or use alcohol, and  no illicit drug use.   FAMILY HISTORY:  Positive for hypertension.  Unknown for stroke.   OBJECTIVE:  VITAL SIGNS:  Systolic blood pressure has been running 115-  120, I do not have any diastolics reported.  Heart rate is in the 70s,  respirations in the 20s.  HEENT:  Head is normocephalic, atraumatic.  NECK:  Supple without bruits.  HEART:  Regular rate and rhythm.  LUNGS:  Clear to auscultation.  ABDOMEN:  Showed an umbilical hernia.  SKIN:  Cool and dry.  NEUROLOGIC:  He was awake and alert, but confused with some language  deficits.  Head  and eye deviation to the right with a left homonymous  hemianopsia and gaze deficit, left facial droop.  Left upper extremity  weakness, about 2/5.  All other extremities 5/5.  Deep tendon reflexes  1+ with downgoing toes.  Finger-to-nose normal on the right.  He could  not do it on the left.  Heel-to-shin normal bilaterally.  Sensation was  decreased on the left.  On the NIH stroke scale, he scored a total of  13.  Level consciousness:  He was alert; scored a zero.  Answered  questions 1 out of 2 correctly, did not get the month correct out of 1.  Obeyed 2 out of 2 commands; gets a zero.  His horizontal gaze:  He had a  forced deviation to the right with a left gaze palsy; he got a 2.  Visual fields:  He had a left homonymous hemianopsia; got a 2 for that.  Facial palsy and minor weakness on the left; got a 1 for that.  Left  arm:  He got a 3, no effort against gravity on the first evaluation.  Right arm:  No drift; got a zero.  Both legs:  No drift; got zero.  Ataxia was absent; got a zero.  Sensory decreased on the left; got a 1.  Best language showed a mild to moderate aphasia;  gets a 1.  Dysarthria:  Mild to moderate dysarthria; gets a 1.  Extinction:  Partial neglect on  the left.  Total score of 13.   CT of the brain was negative for any acute abnormalities.  No  hemorrhage.  NIH stroke scale, therefore, was 13.  Modified ranked scale  was zero; he is completely independent.  CBG is 154.  CT of the head was  negative.  INR was 1.0.  Hemoglobin 14.9, hematocrit 44.7, white cell  count 8.3, platelets 171.   ASSESSMENT:  Left hemiparesis with left homonymous hemianopsia and  neglect, gaze deviation suspicious for a right middle cerebral artery  infarct.  CT was negative.  Due to the time frame after discussion with  family, we will give 2/3 IV TPA and attempt an angiographic retrieval of  clot, if a clot can be found.  Risks and benefits were explained to the  patient's daughters.  He will then have an inpatient workup and physical  therapy and rehab.      Catherine A. Jacolyn Reedy, M.D.  Electronically Signed     CAW/MEDQ  D:  06/05/2007  T:  06/05/2007  Job:  [id number]

## 2011-05-13 NOTE — Assessment & Plan Note (Signed)
DATE OF FOLLOW UP:  July 23, 2007.   Robert Barnett returns to clinic today accompanied by his daughter.  Overall  the patient is doing well.  He is a 77 year old left handed African-  American male who was admitted June 05, 2007 with acute onset of left-  sided weakness and slurred speech.  Cranial CT was negative and tPA was  initiated. MRI study showed right middle super artery infarction.  Cerebral arteriogram showed ulcerated plaque of the right internal  carotid artery associated with 65-70% stenosis that was non obstructed.  Dr. Estanislado Pandy from interventional radiology thought that no intervention  was needed.  The patient was admitted to the rehabilitation unit June 08, 2007 and remained there through discharge.   The patient is about to finish all therapy in [place].  He is  independent with ambulation and has started driving.  He is also working  in his garden and ambulates without any assisted device.  He is  independent with bathing and dressing and continent of bowel and  bladder.  He has been back to see Dr. Nevada Crane his primary care physician  with no changes made.  His family initially had cared for him at their  home but they have now allowed him to return to his own home where he  lives independently.  They are providing meals that he warms up and eats  on a regular basis.  He has apparently changed his diet according to his  daughter.   REVIEW OF SYSTEMS:  Noncontributory.   MEDICATIONS:  1. Plavix 75 mg daily.  2. Zocor 20 mg daily.  3. Aspirin 81 mg daily.  4. Flomax 0.4 mg q.h.s.  5. Hydrochlorothiazide 25 mg daily.  6. Lotrel 10/20 one tablet daily.   PHYSICAL EXAMINATION:  Well-appearing large adult male in no acute  discomfort.  Blood pressure 108/73 with a pulse 51, respiratory rate 18  and O2 saturation 95% on room air.  He has 5 minus/5 strength throughout  bilateral upper and lower extremities.  Bulk and tone were normal and  reflexes were 2+ and  symmetrical.  Sensation was intact to light touch  with bilateral upper and lower extremities.  The patient ambulates  without any assisted device.   IMPRESSION:  1. Status post right middle cerebral artery infarction.  2. Right internal carotid artery stenosis, nonobstructive.  3. Hypertension.  4. Dyslipidemia.  5. History of benign prostatic hypertrophy.   In the office today no adjustment to medicines was made.  Will plan on  seeing the patient in followup on an as needed basis.  He will be  finishing outpatient therapy over the next couple of visits.  He is  independent with bathing and dressing, ambulation, driving, and actually  does some work in his home garden.  Will plan on seeing him in followup  on an as needed basis with followup with his primary care physician as  previously arranged.          ______________________________  Robert Barnett, M.D.    DC/MedQ  D:  07/23/2007 11:12:50  T:  07/23/2007 19:18:11  Job #:  [id number]

## 2011-05-13 NOTE — Assessment & Plan Note (Signed)
OFFICE VISIT   WINDEL, TENOLD  DOB:  1932/12/06                                       12/13/2009  [id number]   I saw the patient in the office today for continued followup of his  carotid disease and peripheral vascular disease.  This is a patient that  Dr. Amedeo Plenty had been following.  Dr. Amedeo Plenty has relocated to [place] so  I was picking up this patient.  This patient underwent a right carotid  endarterectomy in October of 2008 after he had sustained a right  hemispheric stroke associated with left-sided weakness.  Postoperatively  he had developed some swallowing problems and required a feeding tube  for 6 weeks.  Ultimately his swallowing has improved and he has had no  further problems with this.  He is left with only some mild residual  paresthesias and weakness in the left hand.  He has had no new  neurologic symptoms.  Specifically he has had no new onset weakness or  paresthesias, no expressive or receptive aphasia and no amaurosis fugax.   He also has some known peripheral vascular disease, however, really he  currently has no claudication, rest pain or history of nonhealing  ulcers.  His activity is fairly limited, however.   In addition, he has a history of hypertension and hypercholesterolemia,  both of which are stable on his current medications.   PAST MEDICAL HISTORY:  Otherwise unremarkable.  He denies any history of  diabetes, history of previous myocardial infarction, history of  congestive heart failure or history of COPD.   FAMILY HISTORY:  His mother died with heart failure.  He is unaware of  any history of premature cardiovascular disease.   SOCIAL HISTORY:  He is widowed.  He has 5 daughters and 1 son.  He quit  tobacco 20 years ago.  He does not drink alcohol on a regular basis.   MEDICATIONS:  1. Are Zocor 20 mg p.o. daily.  2. Plavix 75 mg p.o. daily.  3. Aspirin 81 mg p.o. daily.  4. Flomax 0.4 mg p.o. daily.  5.  Hydrochlorothiazide 25 mg p.o. daily.  6. Lotrel 10/20 mg p.o. daily.  7. Cod liver oil.   ALLERGIES:  No known drug allergies.   REVIEW OF SYSTEMS:  GENERAL:  He has had no recent weight loss, weight  gain or problems with his appetite.  CARDIOVASCULAR:  He has had no chest pain, chest pressure, palpitations  or arrhythmias.  He denies significant dyspnea on exertion.  He has had  no claudication, rest pain or nonhealing ulcers.  He has had the  previous right hemispheric stroke as described above.  He has had no  history of DVT or phlebitis.  GU:  He does have urinary frequency.  MUSCULOSKELETAL:  He does have some mild low back pain which he  attributes to arthritis.  Pulmonary, GI, neurologic, psychiatric, ENT, hematologic review of  systems is unremarkable.  SKIN:  He does state that he has had psoriasis on his back for years.   PHYSICAL EXAMINATION:  General:  This is a pleasant 77 year old  gentleman who appears his stated age.  Vital signs:  Blood pressure is  123/77, heart rate is 66, temperature 98.  HEENT:  Pupils are equal,  round and reactive to light and accommodation.  Extraocular motions are  intact.  Conjunctivae are normal.  Neck:  Neck is supple.  There is no  jugular venous distention.  Lungs:  Are clear bilaterally to  auscultation without rales, rhonchi or wheezing.  Cardiovascular:  I do  not detect any carotid bruits.  He has a regular rate and rhythm without  murmur or gallop appreciated.  He has no significant peripheral edema.  He has palpable radial and left femoral pulses bilaterally.  There is no  right femoral pulse.  I cannot palpate popliteal or pedal pulses.  Abdomen:  Soft and nontender.  I do not appreciate any masses.  I cannot  palpate an aneurysm.  He has normal pitched bowel sounds.  Musculoskeletal:  There are no major deformities or cyanosis.  Neurologic:  He has minimal weakness to the grip on the left but  otherwise good strength in the  upper extremities and lower extremities  bilaterally with no paresthesias.  Skin:  He does have psoriasis on his  back which is chronic.  Lymphatic:  He has no significant cervical,  axillary or inguinal lymphadenopathy.   Arterial Doppler study in our office today showed monophasic Doppler  signals in the right foot with an ABI of 68%.  He has biphasic signals  in the left foot with an ABI of 95%.   The patient also had a carotid duplex scan which I independently  interpreted and this shows no evidence of recurrent stenosis on the  right and no significant left carotid stenosis.  Both vertebral arteries  are patent with normally directed flow.   With respect to his carotid disease currently he has no evidence of  recurrent stenosis on the right with no significant left carotid  stenosis.  I think a yearly followup duplex is indicated.  We will see  him back at that time.  He does know to continue taking his aspirin and  let us know if he develops any new neurologic symptoms.   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  He has good flow in the left.  As he is asymptomatic  I would not recommend any further workup for his iliac artery occlusive  disease on the right unless he developed symptoms.  Will check followup  ABIs in 1 year.  Again, he knows to call sooner if his symptoms change.   Judeth Cornfield. Scot Dock, M.D.  Electronically Signed   CSD/MEDQ  D:  12/13/2009  T:  12/14/2009  Job:  [id number]
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
## 2011-10-08 ENCOUNTER — Other Ambulatory Visit (HOSPITAL_COMMUNITY): Payer: Self-pay | Admitting: Nephrology

## 2011-10-08 DIAGNOSIS — N289 Disorder of kidney and ureter, unspecified: Secondary | ICD-10-CM

## 2011-10-08 LAB — BASIC METABOLIC PANEL
CO2: 31
Calcium: 8.8
Creatinine, Ser: 1.02
Creatinine, Ser: 1.09
GFR calc non Af Amer: >60
GFR calc non Af Amer: >60
Glucose, Bld: 116 — ABNORMAL HIGH
Glucose, Bld: 141 — ABNORMAL HIGH
Potassium: 3.1 — ABNORMAL LOW
Potassium: 3.3 — ABNORMAL LOW
Potassium: 3.4 — ABNORMAL LOW

## 2011-10-08 LAB — HEMOGLOBIN A1C
Hgb A1c MFr Bld: 6.1
Mean Plasma Glucose: 140

## 2011-10-08 LAB — CBC
HCT: 33.7 — ABNORMAL LOW
MCV: 90.3
MCV: 92.5
Platelets: 240
RBC: 3.73 — ABNORMAL LOW
RBC: 3.99 — ABNORMAL LOW
RDW: 13.4
RDW: 13.6
WBC: 8.5
WBC: 9.3

## 2011-10-08 LAB — TYPE AND SCREEN: ABO/RH(D): A POS

## 2011-10-09 LAB — CBC
Hemoglobin: 14.1
MCHC: 34
MCV: 93
Platelets: 138 — ABNORMAL LOW
Platelets: 174
RDW: 13.8
WBC: 11.2 — ABNORMAL HIGH
WBC: 11.3 — ABNORMAL HIGH

## 2011-10-09 LAB — URINALYSIS, ROUTINE W REFLEX MICROSCOPIC
Glucose, UA: NEGATIVE
Hgb urine dipstick: NEGATIVE
Ketones, ur: NEGATIVE
Nitrite: NEGATIVE
Protein, ur: NEGATIVE
Specific Gravity, Urine: 1.023

## 2011-10-09 LAB — TYPE AND SCREEN: ABO/RH(D): A POS

## 2011-10-09 LAB — BASIC METABOLIC PANEL
Calcium: 8.5
Chloride: 105
GFR calc Af Amer: >60

## 2011-10-09 LAB — PROTIME-INR: INR: 1

## 2011-10-09 LAB — URINE MICROSCOPIC-ADD ON

## 2011-10-09 LAB — COMPREHENSIVE METABOLIC PANEL
AST: 21
Alkaline Phosphatase: 81
BUN: 16
Creatinine, Ser: 1.36
Glucose, Bld: 108 — ABNORMAL HIGH

## 2011-10-09 LAB — ABO/RH: ABO/RH(D): A POS

## 2011-10-16 LAB — CBC
HCT: 42.2
HCT: 44.7
Hemoglobin: 14.2
MCHC: 33.5
MCV: 92
MCV: 92.7
Platelets: 146 — ABNORMAL LOW
Platelets: 171
RBC: 4.68
RBC: 4.98
RDW: 13.6
RDW: 13.8
RDW: 14
RDW: 14.2 — ABNORMAL HIGH
WBC: 8.9
WBC: 9.9

## 2011-10-16 LAB — COMPREHENSIVE METABOLIC PANEL
AST: 33
Albumin: 2.9 — ABNORMAL LOW
Alkaline Phosphatase: 70
Alkaline Phosphatase: 75
BUN: 11
BUN: 12
BUN: 16
CO2: 29
Calcium: 8.7
Chloride: 107
Chloride: 111
Creatinine, Ser: 1.15
GFR calc non Af Amer: >60
GFR calc non Af Amer: >60
Glucose, Bld: 113 — ABNORMAL HIGH
Glucose, Bld: 142 — ABNORMAL HIGH
Potassium: 3.7
Potassium: 3.9
Sodium: 136
Total Bilirubin: 0.6
Total Bilirubin: 1.2
Total Protein: 6.2
Total Protein: 6.4

## 2011-10-16 LAB — DIFFERENTIAL
Basophils Absolute: 0
Basophils Absolute: 0.1
Basophils Relative: 0
Eosinophils Absolute: 0.2
Eosinophils Relative: 2
Lymphocytes Relative: 17
Monocytes Absolute: 0.9 — ABNORMAL HIGH
Monocytes Relative: 9
Neutro Abs: 6
Neutrophils Relative %: 77

## 2011-10-16 LAB — LIPID PANEL
HDL: 23 — ABNORMAL LOW
Total CHOL/HDL Ratio: 7.3
VLDL: 27

## 2011-10-16 LAB — TROPONIN I: Troponin I: 0.09 — ABNORMAL HIGH

## 2011-10-16 LAB — HEMOGLOBIN A1C: Hgb A1c MFr Bld: 6.5 — ABNORMAL HIGH

## 2011-10-16 LAB — PROTIME-INR
INR: 1
Prothrombin Time: 12.9
Prothrombin Time: 13.6
Prothrombin Time: 13.8

## 2011-10-16 LAB — HEPARIN LEVEL (UNFRACTIONATED)
Heparin Unfractionated: <0.1 — ABNORMAL LOW
Heparin Unfractionated: <0.1 — ABNORMAL LOW

## 2011-10-16 LAB — CARDIAC PANEL(CRET KIN+CKTOT+MB+TROPI): Relative Index: 2.2

## 2011-10-20 ENCOUNTER — Other Ambulatory Visit (HOSPITAL_COMMUNITY): Payer: Self-pay

## 2012-08-18 ENCOUNTER — Emergency Department (HOSPITAL_COMMUNITY)
Admission: EM | Admit: 2012-08-18 | Discharge: 2012-08-18 | Disposition: A | Discharge status: Home / Self Care | Payer: Medicare Other | Attending: Emergency Medicine | Admitting: Emergency Medicine

## 2012-08-18 ENCOUNTER — Encounter (HOSPITAL_COMMUNITY): Payer: Self-pay | Admitting: Neurology

## 2012-08-18 ENCOUNTER — Emergency Department (HOSPITAL_COMMUNITY): Payer: Medicare Other

## 2012-08-18 DIAGNOSIS — N289 Disorder of kidney and ureter, unspecified: Secondary | ICD-10-CM | POA: Insufficient documentation

## 2012-08-18 DIAGNOSIS — Y92410 Unspecified street and highway as the place of occurrence of the external cause: Secondary | ICD-10-CM | POA: Insufficient documentation

## 2012-08-18 DIAGNOSIS — S20219A Contusion of unspecified front wall of thorax, initial encounter: Secondary | ICD-10-CM | POA: Insufficient documentation

## 2012-08-18 DIAGNOSIS — Z8673 Personal history of transient ischemic attack (TIA), and cerebral infarction without residual deficits: Secondary | ICD-10-CM | POA: Insufficient documentation

## 2012-08-18 DIAGNOSIS — I10 Essential (primary) hypertension: Secondary | ICD-10-CM | POA: Insufficient documentation

## 2012-08-18 HISTORY — DX: Essential (primary) hypertension: I10

## 2012-08-18 HISTORY — DX: Cerebral infarction, unspecified: I63.9

## 2012-08-18 LAB — POCT I-STAT, CHEM 8
Creatinine, Ser: 1.6 mg/dL — ABNORMAL HIGH (ref 0.50–1.35)
HCT: 44 % (ref 39.0–52.0)
Hemoglobin: 15 g/dL (ref 13.0–17.0)
Sodium: 142 mEq/L (ref 135–145)
TCO2: 26 mmol/L (ref 0–100)

## 2012-08-18 LAB — CBC WITH DIFFERENTIAL/PLATELET
Basophils Relative: 0 % (ref 0–1)
Eosinophils Absolute: 0.1 10*3/uL (ref 0.0–0.7)
MCH: 29.9 pg (ref 26.0–34.0)
MCHC: 33.1 g/dL (ref 30.0–36.0)
Neutro Abs: 7.5 10*3/uL (ref 1.7–7.7)
Neutrophils Relative %: 79 % — ABNORMAL HIGH (ref 43–77)
Platelets: 177 10*3/uL (ref 150–400)
RBC: 4.75 MIL/uL (ref 4.22–5.81)

## 2012-08-18 LAB — URINALYSIS, ROUTINE W REFLEX MICROSCOPIC
Nitrite: NEGATIVE
Protein, ur: 30 mg/dL — AB
Urobilinogen, UA: 1 mg/dL (ref 0.0–1.0)

## 2012-08-18 LAB — URINE MICROSCOPIC-ADD ON

## 2012-08-18 MED ORDER — HYDROCODONE-ACETAMINOPHEN 5-325 MG PO TABS: 1.0000 | ORAL_TABLET | ORAL | Status: AC | PRN

## 2012-08-18 MED ORDER — HYDROCODONE-ACETAMINOPHEN 5-325 MG PO TABS
1.0000 | ORAL_TABLET | Freq: Once | ORAL | Status: AC
  Administered 2012-08-18: 1 via ORAL
  Filled 2012-08-18: qty 1

## 2012-08-18 MED ORDER — SODIUM CHLORIDE 0.9 % IV SOLN: INTRAVENOUS | Status: DC
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
## 2012-08-18 NOTE — ED Notes (Signed)
Patient transported to X-ray 
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
## 2012-08-18 NOTE — ED Notes (Signed)
Pt changed into clean clothes with minimal assistance. Family at bedside to take patient home. Waiting for EDP to come speak with family

## 2012-12-13 ENCOUNTER — Encounter: Payer: Self-pay | Admitting: Vascular Surgery

## 2012-12-31 ENCOUNTER — Other Ambulatory Visit: Payer: Self-pay | Admitting: *Deleted

## 2012-12-31 DIAGNOSIS — Z48812 Encounter for surgical aftercare following surgery on the circulatory system: Secondary | ICD-10-CM

## 2012-12-31 DIAGNOSIS — I6529 Occlusion and stenosis of unspecified carotid artery: Secondary | ICD-10-CM

## 2012-12-31 DIAGNOSIS — I739 Peripheral vascular disease, unspecified: Secondary | ICD-10-CM

## 2013-01-04 ENCOUNTER — Encounter: Payer: Self-pay | Admitting: Neurosurgery

## 2013-01-05 ENCOUNTER — Other Ambulatory Visit: Payer: BC Managed Care – PPO

## 2013-01-05 ENCOUNTER — Ambulatory Visit: Payer: BC Managed Care – PPO | Admitting: Neurosurgery

## 2013-10-10 ENCOUNTER — Other Ambulatory Visit (HOSPITAL_COMMUNITY): Payer: Self-pay | Admitting: Internal Medicine

## 2013-10-10 DIAGNOSIS — N400 Enlarged prostate without lower urinary tract symptoms: Secondary | ICD-10-CM

## 2013-10-10 DIAGNOSIS — N289 Disorder of kidney and ureter, unspecified: Secondary | ICD-10-CM

## 2013-10-12 ENCOUNTER — Ambulatory Visit (HOSPITAL_COMMUNITY)
Admission: RE | Admit: 2013-10-12 | Discharge: 2013-10-12 | Disposition: A | Discharge status: Home / Self Care | Payer: Medicare PPO | Source: Ambulatory Visit | Attending: Internal Medicine | Admitting: Internal Medicine

## 2013-10-12 DIAGNOSIS — Q619 Cystic kidney disease, unspecified: Secondary | ICD-10-CM | POA: Insufficient documentation

## 2013-10-12 DIAGNOSIS — N289 Disorder of kidney and ureter, unspecified: Secondary | ICD-10-CM

## 2013-10-12 DIAGNOSIS — N400 Enlarged prostate without lower urinary tract symptoms: Secondary | ICD-10-CM | POA: Insufficient documentation

## 2014-11-09 ENCOUNTER — Ambulatory Visit (HOSPITAL_COMMUNITY)
Admission: RE | Admit: 2014-11-09 | Discharge: 2014-11-09 | Disposition: A | Discharge status: Home / Self Care | Payer: Medicare PPO | Source: Ambulatory Visit | Attending: Internal Medicine | Admitting: Internal Medicine

## 2014-11-09 ENCOUNTER — Other Ambulatory Visit (HOSPITAL_COMMUNITY): Payer: Self-pay | Admitting: Internal Medicine

## 2014-11-09 DIAGNOSIS — R1084 Generalized abdominal pain: Secondary | ICD-10-CM

## 2014-11-09 DIAGNOSIS — R140 Abdominal distension (gaseous): Secondary | ICD-10-CM

## 2014-11-17 ENCOUNTER — Inpatient Hospital Stay (HOSPITAL_COMMUNITY)
Admission: EM | Admit: 2014-11-17 | Discharge: 2014-11-22 | DRG: 699 | Disposition: A | Discharge status: Home / Self Care | Payer: Medicare PPO | Attending: Internal Medicine | Admitting: Internal Medicine

## 2014-11-17 ENCOUNTER — Inpatient Hospital Stay (HOSPITAL_COMMUNITY): Payer: Medicare PPO

## 2014-11-17 ENCOUNTER — Encounter (HOSPITAL_COMMUNITY): Payer: Self-pay | Admitting: Family Medicine

## 2014-11-17 ENCOUNTER — Emergency Department (HOSPITAL_COMMUNITY): Payer: Medicare PPO

## 2014-11-17 DIAGNOSIS — Z87891 Personal history of nicotine dependence: Secondary | ICD-10-CM

## 2014-11-17 DIAGNOSIS — R109 Unspecified abdominal pain: Secondary | ICD-10-CM | POA: Diagnosis present

## 2014-11-17 DIAGNOSIS — R1310 Dysphagia, unspecified: Secondary | ICD-10-CM

## 2014-11-17 DIAGNOSIS — E785 Hyperlipidemia, unspecified: Secondary | ICD-10-CM | POA: Diagnosis present

## 2014-11-17 DIAGNOSIS — F0390 Unspecified dementia without behavioral disturbance: Secondary | ICD-10-CM | POA: Diagnosis present

## 2014-11-17 DIAGNOSIS — N400 Enlarged prostate without lower urinary tract symptoms: Secondary | ICD-10-CM | POA: Diagnosis present

## 2014-11-17 DIAGNOSIS — N321 Vesicointestinal fistula: Principal | ICD-10-CM | POA: Diagnosis present

## 2014-11-17 DIAGNOSIS — N390 Urinary tract infection, site not specified: Secondary | ICD-10-CM | POA: Diagnosis present

## 2014-11-17 DIAGNOSIS — Z7902 Long term (current) use of antithrombotics/antiplatelets: Secondary | ICD-10-CM

## 2014-11-17 DIAGNOSIS — I959 Hypotension, unspecified: Secondary | ICD-10-CM | POA: Diagnosis present

## 2014-11-17 DIAGNOSIS — N179 Acute kidney failure, unspecified: Secondary | ICD-10-CM | POA: Diagnosis present

## 2014-11-17 DIAGNOSIS — Z7982 Long term (current) use of aspirin: Secondary | ICD-10-CM

## 2014-11-17 DIAGNOSIS — I9589 Other hypotension: Secondary | ICD-10-CM

## 2014-11-17 DIAGNOSIS — K5720 Diverticulitis of large intestine with perforation and abscess without bleeding: Secondary | ICD-10-CM | POA: Diagnosis present

## 2014-11-17 DIAGNOSIS — R197 Diarrhea, unspecified: Secondary | ICD-10-CM | POA: Diagnosis present

## 2014-11-17 DIAGNOSIS — I639 Cerebral infarction, unspecified: Secondary | ICD-10-CM

## 2014-11-17 DIAGNOSIS — Z8673 Personal history of transient ischemic attack (TIA), and cerebral infarction without residual deficits: Secondary | ICD-10-CM | POA: Diagnosis not present

## 2014-11-17 DIAGNOSIS — M1990 Unspecified osteoarthritis, unspecified site: Secondary | ICD-10-CM | POA: Diagnosis present

## 2014-11-17 DIAGNOSIS — E861 Hypovolemia: Secondary | ICD-10-CM | POA: Diagnosis present

## 2014-11-17 DIAGNOSIS — I739 Peripheral vascular disease, unspecified: Secondary | ICD-10-CM | POA: Diagnosis present

## 2014-11-17 DIAGNOSIS — I10 Essential (primary) hypertension: Secondary | ICD-10-CM | POA: Diagnosis present

## 2014-11-17 DIAGNOSIS — Z79899 Other long term (current) drug therapy: Secondary | ICD-10-CM | POA: Diagnosis not present

## 2014-11-17 DIAGNOSIS — T8351XA Infection and inflammatory reaction due to indwelling urinary catheter, initial encounter: Secondary | ICD-10-CM

## 2014-11-17 DIAGNOSIS — A09 Infectious gastroenteritis and colitis, unspecified: Secondary | ICD-10-CM | POA: Diagnosis present

## 2014-11-17 DIAGNOSIS — N19 Unspecified kidney failure: Secondary | ICD-10-CM

## 2014-11-17 DIAGNOSIS — K651 Peritoneal abscess: Secondary | ICD-10-CM

## 2014-11-17 DIAGNOSIS — IMO0002 Reserved for concepts with insufficient information to code with codable children: Secondary | ICD-10-CM | POA: Diagnosis present

## 2014-11-17 DIAGNOSIS — K632 Fistula of intestine: Secondary | ICD-10-CM | POA: Diagnosis present

## 2014-11-17 LAB — URINALYSIS, ROUTINE W REFLEX MICROSCOPIC
Bilirubin Urine: NEGATIVE
Bilirubin Urine: NEGATIVE
Glucose, UA: NEGATIVE mg/dL
Glucose, UA: NEGATIVE mg/dL
Ketones, ur: NEGATIVE mg/dL
Ketones, ur: NEGATIVE mg/dL
Nitrite: NEGATIVE
Nitrite: NEGATIVE
Protein, ur: 30 mg/dL — AB
Protein, ur: 30 mg/dL — AB
SPECIFIC GRAVITY, URINE: 1.015 (ref 1.005–1.030)
Specific Gravity, Urine: 1.016 (ref 1.005–1.030)
UROBILINOGEN UA: 0.2 mg/dL (ref 0.0–1.0)
UROBILINOGEN UA: 0.2 mg/dL (ref 0.0–1.0)
pH: 5 (ref 5.0–8.0)
pH: 5.5 (ref 5.0–8.0)

## 2014-11-17 LAB — COMPREHENSIVE METABOLIC PANEL
ALBUMIN: 2.2 g/dL — AB (ref 3.5–5.2)
ALK PHOS: 64 U/L (ref 39–117)
ALT: 27 U/L (ref 0–53)
AST: 58 U/L — AB (ref 0–37)
Anion gap: 15 (ref 5–15)
BILIRUBIN TOTAL: 0.4 mg/dL (ref 0.3–1.2)
BUN: 67 mg/dL — ABNORMAL HIGH (ref 6–23)
CHLORIDE: 102 meq/L (ref 96–112)
CO2: 18 meq/L — AB (ref 19–32)
CREATININE: 3.62 mg/dL — AB (ref 0.50–1.35)
Calcium: 8.2 mg/dL — ABNORMAL LOW (ref 8.4–10.5)
GFR calc Af Amer: 17 mL/min — ABNORMAL LOW (ref >90)
GFR, EST NON AFRICAN AMERICAN: 15 mL/min — AB (ref >90)
Glucose, Bld: 116 mg/dL — ABNORMAL HIGH (ref 70–99)
POTASSIUM: 4.3 meq/L (ref 3.7–5.3)
Sodium: 135 mEq/L — ABNORMAL LOW (ref 137–147)
Total Protein: 6.8 g/dL (ref 6.0–8.3)

## 2014-11-17 LAB — PROTIME-INR
INR: 1.09 (ref 0.00–1.49)
PROTHROMBIN TIME: 14.3 s (ref 11.6–15.2)

## 2014-11-17 LAB — CBC WITH DIFFERENTIAL/PLATELET
BASOS ABS: 0 10*3/uL (ref 0.0–0.1)
BASOS PCT: 0 % (ref 0–1)
Eosinophils Absolute: 0 10*3/uL (ref 0.0–0.7)
Eosinophils Relative: 0 % (ref 0–5)
HEMATOCRIT: 38.8 % — AB (ref 39.0–52.0)
HEMOGLOBIN: 12.5 g/dL — AB (ref 13.0–17.0)
LYMPHS PCT: 10 % — AB (ref 12–46)
Lymphs Abs: 0.7 10*3/uL (ref 0.7–4.0)
MCH: 29.8 pg (ref 26.0–34.0)
MCHC: 32.2 g/dL (ref 30.0–36.0)
MCV: 92.4 fL (ref 78.0–100.0)
MONO ABS: 1 10*3/uL (ref 0.1–1.0)
MONOS PCT: 14 % — AB (ref 3–12)
NEUTROS ABS: 5.3 10*3/uL (ref 1.7–7.7)
NEUTROS PCT: 76 % (ref 43–77)
Platelets: 136 10*3/uL — ABNORMAL LOW (ref 150–400)
RBC: 4.2 MIL/uL — ABNORMAL LOW (ref 4.22–5.81)
RDW: 13.9 % (ref 11.5–15.5)
WBC: 7 10*3/uL (ref 4.0–10.5)

## 2014-11-17 LAB — APTT: aPTT: 29 seconds (ref 24–37)

## 2014-11-17 LAB — URINE MICROSCOPIC-ADD ON

## 2014-11-17 LAB — LIPASE, BLOOD: Lipase: 119 U/L — ABNORMAL HIGH (ref 11–59)

## 2014-11-17 MED ORDER — SODIUM CHLORIDE 0.9 % IV SOLN
INTRAVENOUS | Status: DC
  Administered 2014-11-17 – 2014-11-20 (×7): via INTRAVENOUS
  Administered 2014-11-22: 50 mL/h via INTRAVENOUS

## 2014-11-17 MED ORDER — ONDANSETRON HCL 4 MG/2ML IJ SOLN: 4.0000 mg | Freq: Three times a day (TID) | INTRAMUSCULAR | Status: DC | PRN

## 2014-11-17 MED ORDER — DONEPEZIL HCL 5 MG PO TABS
5.0000 mg | ORAL_TABLET | Freq: Every day | ORAL | Status: DC
  Administered 2014-11-17 – 2014-11-21 (×5): 5 mg via ORAL
  Filled 2014-11-17 (×6): qty 1

## 2014-11-17 MED ORDER — ACETAMINOPHEN 650 MG RE SUPP: 650.0000 mg | Freq: Four times a day (QID) | RECTAL | Status: DC | PRN

## 2014-11-17 MED ORDER — SIMVASTATIN 40 MG PO TABS
40.0000 mg | ORAL_TABLET | Freq: Every day | ORAL | Status: DC
  Administered 2014-11-18 – 2014-11-21 (×4): 40 mg via ORAL
  Filled 2014-11-17 (×5): qty 1

## 2014-11-17 MED ORDER — HYDROMORPHONE HCL 1 MG/ML IJ SOLN
1.0000 mg | INTRAMUSCULAR | Status: DC | PRN
  Administered 2014-11-17: 1 mg via INTRAVENOUS
  Filled 2014-11-17: qty 1

## 2014-11-17 MED ORDER — PANTOPRAZOLE SODIUM 40 MG PO TBEC
40.0000 mg | DELAYED_RELEASE_TABLET | Freq: Every day | ORAL | Status: DC
  Administered 2014-11-18 – 2014-11-22 (×5): 40 mg via ORAL
  Filled 2014-11-17 (×4): qty 1

## 2014-11-17 MED ORDER — ONDANSETRON HCL 4 MG/2ML IJ SOLN: 4.0000 mg | Freq: Four times a day (QID) | INTRAMUSCULAR | Status: DC | PRN

## 2014-11-17 MED ORDER — PIPERACILLIN-TAZOBACTAM IN DEX 2-0.25 GM/50ML IV SOLN
2.2500 g | Freq: Three times a day (TID) | INTRAVENOUS | Status: DC
  Administered 2014-11-17 – 2014-11-19 (×7): 2.25 g via INTRAVENOUS
  Filled 2014-11-17 (×12): qty 50

## 2014-11-17 MED ORDER — ASPIRIN EC 81 MG PO TBEC
81.0000 mg | DELAYED_RELEASE_TABLET | Freq: Every day | ORAL | Status: DC
  Administered 2014-11-18 – 2014-11-22 (×5): 81 mg via ORAL
  Filled 2014-11-17 (×5): qty 1

## 2014-11-17 MED ORDER — SODIUM CHLORIDE 0.9 % IV SOLN
Freq: Once | INTRAVENOUS | Status: AC
  Administered 2014-11-17: 14:00:00 via INTRAVENOUS

## 2014-11-17 MED ORDER — LATANOPROST 0.005 % OP SOLN
1.0000 [drp] | Freq: Every day | OPHTHALMIC | Status: DC
Start: 2014-11-17 — End: 2014-11-22
  Administered 2014-11-17 – 2014-11-21 (×5): 1 [drp] via OPHTHALMIC
  Filled 2014-11-17: qty 2.5

## 2014-11-17 MED ORDER — ASPIRIN 81 MG PO TABS: 81.0000 mg | ORAL_TABLET | Freq: Every day | ORAL | Status: DC

## 2014-11-17 MED ORDER — HYDROCODONE-ACETAMINOPHEN 5-325 MG PO TABS
1.0000 | ORAL_TABLET | ORAL | Status: DC | PRN
  Administered 2014-11-18 (×2): 1 via ORAL
  Filled 2014-11-17: qty 1
  Filled 2014-11-17: qty 2

## 2014-11-17 MED ORDER — LEVOTHYROXINE SODIUM 50 MCG PO TABS
50.0000 ug | ORAL_TABLET | Freq: Every day | ORAL | Status: DC
  Administered 2014-11-18 – 2014-11-22 (×5): 50 ug via ORAL
  Filled 2014-11-17 (×6): qty 1

## 2014-11-17 MED ORDER — ONDANSETRON HCL 4 MG PO TABS: 4.0000 mg | ORAL_TABLET | Freq: Four times a day (QID) | ORAL | Status: DC | PRN

## 2014-11-17 MED ORDER — SODIUM CHLORIDE 0.9 % IV BOLUS (SEPSIS)
500.0000 mL | Freq: Once | INTRAVENOUS | Status: AC
  Administered 2014-11-17: 500 mL via INTRAVENOUS

## 2014-11-17 MED ORDER — ACETAMINOPHEN 325 MG PO TABS: 650.0000 mg | ORAL_TABLET | Freq: Four times a day (QID) | ORAL | Status: DC | PRN

## 2014-11-17 MED ORDER — ALIGN 4 MG PO CAPS: 1.0000 | ORAL_CAPSULE | Freq: Every day | ORAL | Status: DC

## 2014-11-17 MED ORDER — SACCHAROMYCES BOULARDII 250 MG PO CAPS
250.0000 mg | ORAL_CAPSULE | Freq: Every day | ORAL | Status: DC
  Administered 2014-11-18 – 2014-11-22 (×5): 250 mg via ORAL
  Filled 2014-11-17 (×5): qty 1

## 2014-11-17 NOTE — ED Notes (Signed)
Returned from ct 

## 2014-11-17 NOTE — ED Notes (Signed)
Pt recently seen and treated for UTI. sts still having abdominal pain and diarrhea. sts incontinent of urine.

## 2014-11-17 NOTE — Progress Notes (Signed)
Pt received from ER into room 2w28, family at bedside, pt placed on tele, vitals taken, pt oriented to room and call bell Rickard Rhymes, RN

## 2014-11-17 NOTE — ED Notes (Signed)
Water given to pt pt encouraged to drink

## 2014-11-17 NOTE — H&P (Signed)
History and Physical       Hospital Admission Note Date: 11/17/2014  Patient name: Robert Barnett Medical record number: [id number] Date of birth: Jan 02, 1932 Age: 80 y.o. Gender: male  PCP: Delphina Cahill, MD    Chief Complaint:  Abdominal pain, diarrhea for last 4-5 days  HPI: Patient is a 80 year old male with hypertension, hyperlipidemia, prior stroke, dysphagia, BPH presented with abdominal pain and diarrhea. 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 Patient's daughters provided most of the history. Patient's daughter reported that he had been complaining of abdominal pain, lower, and suprapubic region with urinary incontinence and diarrhea. Patient went to his PCP, abdominal ultrasound showed cholelithiasis with bilateral renal cysts. Patient denied any fevers, chills or nausea or vomiting. Patient's abdominal pain did not improve and he was brought to ED for further evaluation. 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   Gen. surgery was consulted, recommended medical admission, IV fluids, antibiotics and IR consult for percutaneous drainage of the abscess.    Review of Systems:  Constitutional: Denies fever, chills, diaphoresis, poor appetite and fatigue.  HEENT: Denies photophobia, eye pain, redness, hearing loss, ear pain, congestion, sore throat, rhinorrhea, sneezing, mouth sores, trouble swallowing, neck pain, neck stiffness and tinnitus.   Respiratory: Denies SOB, DOE, cough, chest tightness,  and wheezing.   Cardiovascular: Denies chest pain, palpitations and leg swelling.  Gastrointestinal: Please see  history of present illness  Genitourinary: Please see history of present illness Musculoskeletal: Denies myalgias, back pain, joint swelling, arthralgias and gait problem.  Skin: Denies pallor, rash and wound.  Neurological: Denies dizziness, seizures, syncope, weakness, light-headedness, numbness and headaches.  Hematological: Denies adenopathy. Easy bruising, personal or family bleeding history  Psychiatric/Behavioral: Denies suicidal ideation, mood changes, confusion, nervousness, sleep disturbance and agitation  Past Medical History: Past Medical History  Diagnosis Date  . Hypertension   . Hyperlipidemia   . Peripheral vascular disease   . Carotid artery occlusion   . Arthritis   . Dysphagia   . Stroke     Right hemispheric CVA  . BPH (benign prostatic hyperplasia)   . Umbilical hernia    Past Surgical History  Procedure Laterality Date  . Carotid endarterectomy  10/06/2007    Right CEA by Dr. Amedeo Plenty  . Peg tube placement  2008    Medications: Prior to Admission medications   Medication Sig Start Date End Date Taking? Authorizing Provider  amLODipine-benazepril (LOTREL) 10-20 MG per capsule Take 1 capsule by mouth daily.   Yes Historical Provider, MD  aspirin 81 MG tablet Take 81 mg by mouth daily.   Yes Historical Provider, MD  ciprofloxacin (CIPRO) 250 MG tablet Take 250 mg by mouth 2 (two) times daily.   Yes Historical Provider, MD  clopidogrel (PLAVIX) 75 MG tablet Take 75 mg by mouth daily.   Yes Historical Provider, MD  donepezil (ARICEPT) 5 MG tablet Take 5 mg by mouth at bedtime.   Yes Historical Provider, MD  levothyroxine (SYNTHROID, LEVOTHROID) 50 MCG tablet Take 50 mcg by mouth daily before breakfast.   Yes Historical Provider, MD  LUMIGAN 0.01 % SOLN Place 1 drop into both eyes at bedtime. 10/12/14  Yes Historical Provider, MD  pantoprazole (Ogema)  40 MG tablet Take 40 mg by mouth daily.   Yes Historical Provider, MD  Probiotic Product (ALIGN) 4 MG CAPS Take  1 capsule by mouth daily.   Yes Historical Provider, MD  simvastatin (ZOCOR) 40 MG tablet Take 40 mg by mouth daily.   Yes Historical Provider, MD    Allergies:  No Known Allergies  Social History:  reports that he has quit smoking. He does not have any smokeless tobacco history on file. He reports that he does not drink alcohol or use illicit drugs.  Family History: History reviewed. No pertinent family history.  Physical Exam: Blood pressure 90/44, pulse 64, temperature 97.9 F (36.6 C), temperature source Oral, resp. rate 17, weight 105.291 kg (232 lb 2 oz), SpO2 95 %. General: Alert, awake, oriented x3, in no acute distress. HEENT: normocephalic, atraumatic, anicteric sclera, pink conjunctiva, pupils equal and reactive to light and accomodation, oropharynx clear Neck: supple, no masses or lymphadenopathy, no goiter, no bruits  Heart: Regular rate and rhythm, without murmurs, rubs or gallops. Lungs: Clear to auscultation bilaterally, no wheezing, rales or rhonchi. Abdomen: Soft, tenderness in the lower quadrants and suprapubic area, no rebound or guarding or CVAT, NBS  Extremities: No clubbing, cyanosis or edema with positive pedal pulses. Neuro: Grossly intact, no focal neurological deficits, strength 5/5 upper and lower extremities bilaterally Psych: alert and oriented x 3, normal mood and affect Skin: no rashes or lesions, warm and dry, psoriasis eczematous rash on the back   LABS on Admission:  Basic Metabolic Panel:  Recent Labs Lab 11/17/14 1111  NA 135*  K 4.3  CL 102  CO2 18*  GLUCOSE 116*  BUN 67*  CREATININE 3.62*  CALCIUM 8.2*   Liver Function Tests:  Recent Labs Lab 11/17/14 1111  AST 58*  ALT 27  ALKPHOS 64  BILITOT 0.4  PROT 6.8  ALBUMIN 2.2*    Recent Labs Lab 11/17/14 1111  LIPASE 119*   No results for input(s): AMMONIA in the last 168 hours. CBC:  Recent Labs Lab 11/17/14 1111  WBC 7.0  NEUTROABS 5.3  HGB 12.5*  HCT 38.8*  MCV  92.4  PLT 136*   Cardiac Enzymes: No results for input(s): CKTOTAL, CKMB, CKMBINDEX, TROPONINI in the last 168 hours. BNP: Invalid input(s): POCBNP CBG: No results for input(s): GLUCAP in the last 168 hours.   Radiological Exams on Admission: Ct Abdomen Pelvis Wo Contrast  11/17/2014   CLINICAL DATA:  Subsequent encounter for abdominal pain and diarrhea. Pain in the right lower quadrant and difficulty urinating.  EXAM: CT ABDOMEN AND PELVIS WITHOUT CONTRAST  TECHNIQUE: Multidetector CT imaging of the abdomen and pelvis was performed following the standard protocol without IV contrast.  COMPARISON:  05/05/2005  FINDINGS: Lower chest: Compressive atelectasis noted in the dependent lung bases bilaterally.  Hepatobiliary: 9 mm low-density lesion in the dome of the left liver is probably a cyst. Liver with otherwise unremarkable on infused appearance. Multiple calcified gallstones measure up to 7 mm in diameter. No intrahepatic or extrahepatic biliary dilation.  Pancreas: No focal mass lesion. No dilatation of the main duct. No intraparenchymal cyst. No peripancreatic edema.  Spleen: No splenomegaly. No focal mass lesion.  Adrenals/Urinary Tract: No adrenal nodule or mass. 2.5 cm water density lesion in the interpolar right kidney is likely a cyst. 2.4 cm water density lesion in the lower pole the left kidney is also likely a cyst. No evidence for stone disease in either kidney. No evidence for hydroureter or ureteral  stone disease. Bladder is mildly distended and shows circumferential bladder wall thickening. Associated with the anterior bladder dome is marked wall thickening with end immediately contiguous 4.3 x 3.8 x 3.8 cm collection of debris and gas. This collection appears tether tube both in the dome of the bladder in the mid sigmoid colon. Surrounding edema/ inflammation is evident. There is a small amount a gas visible in the lumen of the urinary bladder.  Stomach/Bowel: Stomach is nondistended.  No gastric wall thickening. No evidence of outlet obstruction. Duodenum is normally positioned as is the ligament of Treitz. No small bowel wall thickening. No small bowel dilatation. Terminal ileum and appendix are normal. Advanced diverticulosis is noted in the sigmoid colon. Please see adrenal urinary tract section above.  Vascular/Lymphatic: Atherosclerotic calcification is noted in the wall of the abdominal aorta without aneurysm. Right common iliac artery measures 2.6 cm in maximum diameter. Left common iliac artery measures up to 2.3 cm in diameter.  No gastrohepatic ligament or hepatoduodenal ligament lymphadenopathy. No retroperitoneal lymphadenopathy. No evidence for pelvic sidewall lymphadenopathy.  Reproductive: Prostate gland is enlarged.  Other: No intraperitoneal free fluid. Bilateral inguinal hernias contain only fat.  Musculoskeletal: Mild degenerative changes are seen in the hips bilaterally. No worrisome lytic or sclerotic osseous abnormality.  IMPRESSION: 4.3 cm abscess involving the bladder wall at the dome also appears tethered to the mid transverse colon which shows associated wall thickening. There is some surrounding edema/ inflammation and gas is visible in the urinary bladder. The presence of gas in the bladder is highly suggestive of colovesical fistula, likely secondary to previous diverticulitis.  Probable bilateral renal cysts.  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 Left common iliac artery measures 2.3 cm in diameter.  Insert cholelithiasis.   Electronically Signed   By: Misty Stanley M.D.   On: 11/17/2014 13:04   US Abdomen Complete  11/09/2014   CLINICAL DATA:  Abdominal pain and distension  EXAM: ULTRASOUND ABDOMEN COMPLETE  COMPARISON:  10/12/2013.  FINDINGS: Gallbladder: Cholelithiasis is identified. Mild wall thickening is noted. No pericholecystic fluid or sonographic Percell Miller sign is noted. The wall thickness may be related to partial decompression of the gallbladder.   Common bile duct: Diameter: 3.4 mm.  Liver: No focal lesion identified. Within normal limits in parenchymal echogenicity.  IVC: Not visualized due to overlying bowel gas.  Pancreas: Not visualized due to overlying bowel gas.  Spleen: Size and appearance within normal limits.  Right Kidney: Length: 9.8 cm. 1.6 cm hypoechoic lesion is noted in the upper pole consistent with a cysts. This is stable in appearance from the prior exam. Mild cortical thinning is noted as well as increased echogenicity.  Left Kidney: Length: 11.8 cm. 2 cm complex cyst is noted in the lower pole. This is stable from the prior exam as well.  Abdominal aorta: No aneurysm visualized.  Other findings: None.  IMPRESSION: Bilateral renal cystic change.  Cholelithiasis   Electronically Signed   By: Inez Catalina M.D.   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 surgery has been consulted, recommended IR consult with percutaneous drainage of the abscess, drain placement. IR consulted - Keep NPO status, IV fluids, bowel rest, IV Zosyn, pain control - will check stool studies - Urology also has been consulted (per surgery PA, Dr Georgette Dover discussed with on-call urology)  Active Problems:   UTI (urinary tract infection) -  Obtain urine cultures, continue IV Zosyn    Hypotension - Likely due to #1, hold amlodipine, benazepril, continue IV fluids    Diarrhea - Likely due to #1, obtain C. difficile PCR, GI pathogen panel    Hyperlipidemia - Continue statin    Dysphagia- currently NPO    Stroke - Continue aspirin, hold Plavix until percutaneous drain procedure is completed    BPH (benign prostatic hyperplasia) -Foley catheter to be placed    DVT prophylaxis:  SCDs   CODE STATUS:  full CODE STATUS   Family Communication: Admission, patients condition and plan of care  including tests being ordered have been discussed with the patient and  Daughters who indicates understanding and agree with the plan and Code Status   Further plan will depend as patient's clinical course evolves and further radiologic and laboratory data become available.   Time Spent on Admission: 1 hour  RAI,RIPUDEEP M.D. [facility] 11/17/2014, 3:45 PM Pager: [phone]  If 7PM-7AM, please contact night-coverage [email or web address] Password [email or web address]

## 2014-11-17 NOTE — ED Notes (Signed)
Pt states he voided when in waiting room bladder scan 15 ml noted

## 2014-11-17 NOTE — Progress Notes (Signed)
ANTIBIOTIC CONSULT NOTE - INITIAL  Pharmacy Consult for zosyn Indication: bladder abscess  No Known Allergies  Patient Measurements: Weight: 232 lb 2 oz (105.291 kg)  Vital Signs: Temp: 97.9 F (36.6 C) (11/20 1034) Temp Source: Oral (11/20 1034) BP: 99/85 mmHg (11/20 1345) Pulse Rate: 73 (11/20 1345) Intake/Output from previous day:   Intake/Output from this shift: Total I/O In: -  Out: 300 [Urine:300]  Labs:  Recent Labs  11/17/14 1111  WBC 7.0  HGB 12.5*  PLT 136*  CREATININE 3.62*   CrCl cannot be calculated (Unknown ideal weight.). No results for input(s): VANCOTROUGH, VANCOPEAK, VANCORANDOM, GENTTROUGH, GENTPEAK, GENTRANDOM, TOBRATROUGH, TOBRAPEAK, TOBRARND, AMIKACINPEAK, AMIKACINTROU, AMIKACIN in the last 72 hours.   Microbiology: No results found for this or any previous visit (from the past 720 hour(s)).  Medical History: Past Medical History  Diagnosis Date  . Hypertension   . Hyperlipidemia   . Peripheral vascular disease   . Carotid artery occlusion   . Arthritis   . Dysphagia   . Stroke     Right hemispheric CVA  . BPH (benign prostatic hyperplasia)   . Umbilical hernia    Assessment: 80 year old male, he has a history of known renal cysts which were seen on ultrasound in October 2014, they were confirmed on an abdominal ultrasound which was performed 8 days ago. The patient has also had mild gallstones and gallbladder wall thickening but states that he has no abdominal pain in the upper abdomen. No fevers noted, wbc 7. Noted acute renal failure with scr 3.6 (1.6 in 2013). Zosyn ordered for bladder abscess, will dose adjust given renal failure.   Plan:  Zosyn 2.25g IV q8 hours Follow up renal function for adjustments  Erin Hearing PharmD., BCPS Clinical Pharmacist Pager [phone] 11/17/2014 1:55 PM

## 2014-11-17 NOTE — ED Provider Notes (Signed)
CSN: [id number]     Arrival date & time 11/17/14  1027 History   First MD Initiated Contact with Patient 11/17/14 1040     Chief Complaint  Patient presents with  . Diarrhea  . Abdominal Pain     (Consider location/radiation/quality/duration/timing/severity/associated sxs/prior Treatment) HPI Comments: The patient is an 80 year old male, he has a history of known renal cysts which were seen on ultrasound in October 2014, they were confirmed on an abdominal ultrasound which was performed 8 days ago. The patient has also had mild gallstones and gallbladder wall thickening but states that he has no abdominal pain in the upper abdomen. 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 They state that he has had some urinary incontinence over the last couple of days, he is also having some diarrhea. There has been no fevers chills nausea or vomiting. His appetite has decreased. The patient has no complaints when you ask him if he is feeling poorly.  Patient is on ciprofloxacin for recently diagnosed urinary tract infection.  Patient is a 80 y.o. male presenting with diarrhea and abdominal pain. The history is provided by the patient.  Diarrhea Associated symptoms: abdominal pain   Abdominal Pain Associated symptoms: diarrhea     Past Medical History  Diagnosis Date  . Hypertension   . Hyperlipidemia   . Peripheral vascular disease   . Carotid artery occlusion   . Arthritis   . Dysphagia   . Stroke     Right hemispheric CVA  . BPH (benign prostatic hyperplasia)   . Umbilical hernia    Past Surgical History  Procedure Laterality Date  . Carotid endarterectomy  10/06/2007    Right CEA by Dr. Amedeo Plenty  . Peg tube placement  2008   History reviewed. No pertinent family history. History  Substance Use Topics  . Smoking status: Former Research scientist (life sciences)  . Smokeless tobacco: Not on file  . Alcohol Use: No    Review of Systems  Gastrointestinal: Positive for  abdominal pain and diarrhea.  All other systems reviewed and are negative.     Allergies  Review of patient's allergies indicates no known allergies.  Home Medications   Prior to Admission medications   Medication Sig Start Date End Date Taking? Authorizing Provider  amLODipine-benazepril (LOTREL) 10-20 MG per capsule Take 1 capsule by mouth daily.   Yes Historical Provider, MD  aspirin 81 MG tablet Take 81 mg by mouth daily.   Yes Historical Provider, MD  ciprofloxacin (CIPRO) 250 MG tablet Take 250 mg by mouth 2 (two) times daily.   Yes Historical Provider, MD  clopidogrel (PLAVIX) 75 MG tablet Take 75 mg by mouth daily.   Yes Historical Provider, MD  donepezil (ARICEPT) 5 MG tablet Take 5 mg by mouth at bedtime.   Yes Historical Provider, MD  levothyroxine (SYNTHROID, LEVOTHROID) 50 MCG tablet Take 50 mcg by mouth daily before breakfast.   Yes Historical Provider, MD  LUMIGAN 0.01 % SOLN Place 1 drop into both eyes at bedtime. 10/12/14  Yes Historical Provider, MD  pantoprazole (PROTONIX) 40 MG tablet Take 40 mg by mouth daily.   Yes Historical Provider, MD  Probiotic Product (ALIGN) 4 MG CAPS Take 1 capsule by mouth daily.   Yes Historical Provider, MD  simvastatin (ZOCOR) 40 MG tablet Take 40 mg by mouth daily.   Yes Historical Provider, MD   BP 90/44 mmHg  Pulse 64  Temp(Src) 97.9 F (36.6 C) (Oral)  Resp 17  Wt 232 lb 2 oz (105.291 kg)  SpO2 95% Physical Exam  Constitutional: He appears well-developed and well-nourished. No distress.  HENT:  Head: Normocephalic and atraumatic.  Mouth/Throat: Oropharynx is clear and moist. No oropharyngeal exudate.  Eyes: Conjunctivae and EOM are normal. Pupils are equal, round, and reactive to light. Right eye exhibits no discharge. Left eye exhibits no discharge. No scleral icterus.  Neck: Normal range of motion. Neck supple. No JVD present. No thyromegaly present.  Cardiovascular: Normal rate, regular rhythm, normal heart sounds and  intact distal pulses.  Exam reveals no gallop and no friction rub.   No murmur heard. Pulmonary/Chest: Effort normal and breath sounds normal. No respiratory distress. He has no wheezes. He has no rales.  Abdominal: Soft. Bowel sounds are normal. He exhibits no distension and no mass. There is tenderness ( Mild tenderness to palpation in the suprapubic area, mild fullness, no guarding).  Musculoskeletal: Normal range of motion. He exhibits no edema or tenderness.  Lymphadenopathy:    He has no cervical adenopathy.  Neurological: He is alert. Coordination normal.  Skin: Skin is warm and dry. No rash noted. No erythema.  Psychiatric: He has a normal mood and affect. His behavior is normal.  Nursing note and vitals reviewed.   ED Course  Procedures (including critical care time) Labs Review Labs Reviewed  CBC WITH DIFFERENTIAL - Abnormal; Notable for the following:    RBC 4.20 (*)    Hemoglobin 12.5 (*)    HCT 38.8 (*)    Platelets 136 (*)    Lymphocytes Relative 10 (*)    Monocytes Relative 14 (*)    All other components within normal limits  COMPREHENSIVE METABOLIC PANEL - Abnormal; Notable for the following:    Sodium 135 (*)    CO2 18 (*)    Glucose, Bld 116 (*)    BUN 67 (*)    Creatinine, Ser 3.62 (*)    Calcium 8.2 (*)    Albumin 2.2 (*)    AST 58 (*)    GFR calc non Af Amer 15 (*)    GFR calc Af Amer 17 (*)    All other components within normal limits  LIPASE, BLOOD - Abnormal; Notable for the following:    Lipase 119 (*)    All other components within normal limits  URINALYSIS, ROUTINE W REFLEX MICROSCOPIC - Abnormal; Notable for the following:    APPearance CLOUDY (*)    Hgb urine dipstick MODERATE (*)    Protein, ur 30 (*)    Leukocytes, UA LARGE (*)    All other components within normal limits  URINE MICROSCOPIC-ADD ON - Abnormal; Notable for the following:    Squamous Epithelial / LPF FEW (*)    Bacteria, UA FEW (*)    Casts GRANULAR CAST (*)    All other  components within normal limits    Imaging Review Ct Abdomen Pelvis Wo Contrast  11/17/2014   CLINICAL DATA:  Subsequent encounter for abdominal pain and diarrhea. Pain in the right lower quadrant and difficulty urinating.  EXAM: CT ABDOMEN AND PELVIS WITHOUT CONTRAST  TECHNIQUE: Multidetector CT imaging of the abdomen and pelvis was performed following the standard protocol without IV contrast.  COMPARISON:  05/05/2005  FINDINGS: Lower chest: Compressive atelectasis noted in the dependent lung bases bilaterally.  Hepatobiliary: 9 mm low-density lesion in the dome of the left liver is probably a cyst. Liver with otherwise unremarkable on infused appearance. Multiple calcified gallstones measure up to 7 mm  in diameter. No intrahepatic or extrahepatic biliary dilation.  Pancreas: No focal mass lesion. No dilatation of the main duct. No intraparenchymal cyst. No peripancreatic edema.  Spleen: No splenomegaly. No focal mass lesion.  Adrenals/Urinary Tract: No adrenal nodule or mass. 2.5 cm water density lesion in the interpolar right kidney is likely a cyst. 2.4 cm water density lesion in the lower pole the left kidney is also likely a cyst. No evidence for stone disease in either kidney. No evidence for hydroureter or ureteral stone disease. Bladder is mildly distended and shows circumferential bladder wall thickening. Associated with the anterior bladder dome is marked wall thickening with end immediately contiguous 4.3 x 3.8 x 3.8 cm collection of debris and gas. This collection appears tether tube both in the dome of the bladder in the mid sigmoid colon. Surrounding edema/ inflammation is evident. There is a small amount a gas visible in the lumen of the urinary bladder.  Stomach/Bowel: Stomach is nondistended. No gastric wall thickening. No evidence of outlet obstruction. Duodenum is normally positioned as is the ligament of Treitz. No small bowel wall thickening. No small bowel dilatation. Terminal ileum and  appendix are normal. Advanced diverticulosis is noted in the sigmoid colon. Please see adrenal urinary tract section above.  Vascular/Lymphatic: Atherosclerotic calcification is noted in the wall of the abdominal aorta without aneurysm. Right common iliac artery measures 2.6 cm in maximum diameter. Left common iliac artery measures up to 2.3 cm in diameter.  No gastrohepatic ligament or hepatoduodenal ligament lymphadenopathy. No retroperitoneal lymphadenopathy. No evidence for pelvic sidewall lymphadenopathy.  Reproductive: Prostate gland is enlarged.  Other: No intraperitoneal free fluid. Bilateral inguinal hernias contain only fat.  Musculoskeletal: Mild degenerative changes are seen in the hips bilaterally. No worrisome lytic or sclerotic osseous abnormality.  IMPRESSION: 4.3 cm abscess involving the bladder wall at the dome also appears tethered to the mid transverse colon which shows associated wall thickening. There is some surrounding edema/ inflammation and gas is visible in the urinary bladder. The presence of gas in the bladder is highly suggestive of colovesical fistula, likely secondary to previous diverticulitis.  Probable bilateral renal cysts.  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 Left common iliac artery measures 2.3 cm in diameter.  Insert cholelithiasis.   Electronically Signed   By: Misty Stanley M.D.   On: 11/17/2014 13:04      MDM   Final diagnoses:  Abdominal pain  Abdominal abscess  Renal failure  UTI (lower urinary tract infection)    Other than urinary incontinence  suprapubic fullness the patient's physical exam is rather benign. I am somewhat concerned with his now incontinence, I would consider BPH, consider urinary infection, consider other obstructing mass, he will need a bedside ultrasound bladder scan, urinalysis, repeat labs, consider CT scan of the abdomen. If he does have a urinary obstruction he will need a Foley catheter placed.  The patient has had ongoing  mild tenderness, his blood pressure is soft, no tachycardia, labs show no leukocytosis, acute renal failure urinary tract infection. Concern for intra-abdominal pathology was correct - intraabdominal abscess seen - has no leukocytosis - soft BP - needs fluids and admission medical team to admit - surgery and urology will consult.  Meds given in ED:  Medications  piperacillin-tazobactam (ZOSYN) IVPB 2.25 g (0 g Intravenous Stopped 11/17/14 1448)  sodium chloride 0.9 % bolus 500 mL (0 mLs Intravenous Stopped 11/17/14 1256)  0.9 %  sodium chloride infusion ( Intravenous New Bag/Given 11/17/14 1348)  New Prescriptions   No medications on file      Johnna Acosta, MD 11/17/14 1545

## 2014-11-17 NOTE — ED Notes (Signed)
Admitting MD at bedside.

## 2014-11-17 NOTE — ED Notes (Signed)
Pt to ct 

## 2014-11-17 NOTE — Consult Note (Signed)
Reason for Consult: Intraabdominal abscess Referring Physician:Dr. Taivon Haroon is an 80 y.o. male.  HPI: The patient is an 80 year old male who presented to the [facility] ER today with his daughters with complaint of lower abdominal pain. He has a history of known renal cysts which were seen on ultrasound in October 2014 and they were confirmed on an abdominal ultrasound which was performed 8 days ago. The patient has also had history of gallstones and gallbladder wall thickening but states that he has no abdominal pain in the upper abdomen. 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 They state that he has had some urinary incontinence over the last couple of days along with some diarrhea. There has been no fevers, chills, nausea or vomiting. His appetite has decreased. The daughter's state that his abdominal pain intensified today and this prompted them to bring him for evaluation.  On today's CT A/P results indicate 4.3cm abscess at dome of bladder. Portion of mid-transverse colon also appears tethered to dome of bladder. Gas also present in bladder suggestive of colovesical fistula. Previous abdominal surgery is only that of PEG tube placement following CVA in 05/2007  PCP: [facility] Past Medical History  Diagnosis Date  . Hypertension   . Hyperlipidemia   . Peripheral vascular disease   . Carotid artery occlusion   . Arthritis   . Dysphagia   . Stroke     Right hemispheric CVA  . BPH (benign prostatic hyperplasia)   . Umbilical hernia     Past Surgical History  Procedure Laterality Date  . Carotid endarterectomy  10/06/2007    Right CEA by Dr. Amedeo Plenty  . Peg tube placement  2008    History reviewed. No pertinent family history.  Social History:  reports that he has quit smoking. He does not have any smokeless tobacco history on file. He reports that he does not drink alcohol or use illicit drugs. 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   Allergies: No Known Allergies  Medications: I have reviewed the patient's current medications. Reported to be taking Amlodipine, Plavix, Cod Oil, HCTZ, Zocor and Flomax  Results for orders placed or performed during the hospital encounter of 11/17/14 (from the past 48 hour(s))  CBC with Differential     Status: Abnormal   Collection Time: 11/17/14 11:11 AM  Result Value Ref Range   WBC 7.0 4.0 - 10.5 K/uL   RBC 4.20 (L) 4.22 - 5.81 MIL/uL   Hemoglobin 12.5 (L) 13.0 - 17.0 g/dL   HCT 38.8 (L) 39.0 - 52.0 %   MCV 92.4 78.0 - 100.0 fL   MCH 29.8 26.0 - 34.0 pg   MCHC 32.2 30.0 - 36.0 g/dL   RDW 13.9 11.5 - 15.5 %   Platelets 136 (L) 150 - 400 K/uL   Neutrophils Relative % 76 43 - 77 %   Neutro Abs 5.3 1.7 - 7.7 K/uL   Lymphocytes Relative 10 (L) 12 - 46 %   Lymphs Abs 0.7 0.7 - 4.0 K/uL   Monocytes Relative 14 (H) 3 - 12 %   Monocytes Absolute 1.0 0.1 - 1.0 K/uL   Eosinophils Relative 0 0 - 5 %   Eosinophils Absolute 0.0 0.0 - 0.7 K/uL   Basophils Relative 0 0 - 1 %   Basophils Absolute 0.0 0.0 - 0.1 K/uL  Comprehensive metabolic panel  Status: Abnormal   Collection Time: 11/17/14 11:11 AM  Result Value Ref Range   Sodium 135 (L) 137 - 147 mEq/L   Potassium 4.3 3.7 - 5.3 mEq/L   Chloride 102 96 - 112 mEq/L   CO2 18 (L) 19 - 32 mEq/L   Glucose, Bld 116 (H) 70 - 99 mg/dL   BUN 67 (H) 6 - 23 mg/dL   Creatinine, Ser 3.62 (H) 0.50 - 1.35 mg/dL   Calcium 8.2 (L) 8.4 - 10.5 mg/dL   Total Protein 6.8 6.0 - 8.3 g/dL   Albumin 2.2 (L) 3.5 - 5.2 g/dL   AST 58 (H) 0 - 37 U/L   ALT 27 0 - 53 U/L   Alkaline Phosphatase 64 39 - 117 U/L   Total Bilirubin 0.4 0.3 - 1.2 mg/dL   GFR calc non Af Amer 15 (L) >90 mL/min   GFR calc Af Amer 17 (L) >90 mL/min    Comment: (NOTE) The eGFR has been calculated using the CKD EPI equation. This calculation has not been validated in all clinical situations. eGFR's persistently <90 mL/min  signify possible Chronic Kidney Disease.    Anion gap 15 5 - 15  Lipase, blood     Status: Abnormal   Collection Time: 11/17/14 11:11 AM  Result Value Ref Range   Lipase 119 (H) 11 - 59 U/L  Urinalysis, Routine w reflex microscopic     Status: Abnormal   Collection Time: 11/17/14 12:49 PM  Result Value Ref Range   Color, Urine YELLOW YELLOW   APPearance CLOUDY (A) CLEAR   Specific Gravity, Urine 1.016 1.005 - 1.030   pH 5.0 5.0 - 8.0   Glucose, UA NEGATIVE NEGATIVE mg/dL   Hgb urine dipstick MODERATE (A) NEGATIVE   Bilirubin Urine NEGATIVE NEGATIVE   Ketones, ur NEGATIVE NEGATIVE mg/dL   Protein, ur 30 (A) NEGATIVE mg/dL   Urobilinogen, UA 0.2 0.0 - 1.0 mg/dL   Nitrite NEGATIVE NEGATIVE   Leukocytes, UA LARGE (A) NEGATIVE  Urine microscopic-add on     Status: Abnormal   Collection Time: 11/17/14 12:49 PM  Result Value Ref Range   Squamous Epithelial / LPF FEW (A) RARE   WBC, UA TOO NUMEROUS TO COUNT <3 WBC/hpf   RBC / HPF 3-6 <3 RBC/hpf   Bacteria, UA FEW (A) RARE   Casts GRANULAR CAST (A) NEGATIVE    Ct Abdomen Pelvis Wo Contrast  11/17/2014   CLINICAL DATA:  Subsequent encounter for abdominal pain and diarrhea. Pain in the right lower quadrant and difficulty urinating.  EXAM: CT ABDOMEN AND PELVIS WITHOUT CONTRAST  TECHNIQUE: Multidetector CT imaging of the abdomen and pelvis was performed following the standard protocol without IV contrast.  COMPARISON:  05/05/2005  FINDINGS: Lower chest: Compressive atelectasis noted in the dependent lung bases bilaterally.  Hepatobiliary: 9 mm low-density lesion in the dome of the left liver is probably a cyst. Liver with otherwise unremarkable on infused appearance. Multiple calcified gallstones measure up to 7 mm in diameter. No intrahepatic or extrahepatic biliary dilation.  Pancreas: No focal mass lesion. No dilatation of the main duct. No intraparenchymal cyst. No peripancreatic edema.  Spleen: No splenomegaly. No focal mass lesion.   Adrenals/Urinary Tract: No adrenal nodule or mass. 2.5 cm water density lesion in the interpolar right kidney is likely a cyst. 2.4 cm water density lesion in the lower pole the left kidney is also likely a cyst. No evidence for stone disease in either kidney. No evidence for hydroureter or  ureteral stone disease. Bladder is mildly distended and shows circumferential bladder wall thickening. Associated with the anterior bladder dome is marked wall thickening with end immediately contiguous 4.3 x 3.8 x 3.8 cm collection of debris and gas. This collection appears tether tube both in the dome of the bladder in the mid sigmoid colon. Surrounding edema/ inflammation is evident. There is a small amount a gas visible in the lumen of the urinary bladder.  Stomach/Bowel: Stomach is nondistended. No gastric wall thickening. No evidence of outlet obstruction. Duodenum is normally positioned as is the ligament of Treitz. No small bowel wall thickening. No small bowel dilatation. Terminal ileum and appendix are normal. Advanced diverticulosis is noted in the sigmoid colon. Please see adrenal urinary tract section above.  Vascular/Lymphatic: Atherosclerotic calcification is noted in the wall of the abdominal aorta without aneurysm. Right common iliac artery measures 2.6 cm in maximum diameter. Left common iliac artery measures up to 2.3 cm in diameter.  No gastrohepatic ligament or hepatoduodenal ligament lymphadenopathy. No retroperitoneal lymphadenopathy. No evidence for pelvic sidewall lymphadenopathy.  Reproductive: Prostate gland is enlarged.  Other: No intraperitoneal free fluid. Bilateral inguinal hernias contain only fat.  Musculoskeletal: Mild degenerative changes are seen in the hips bilaterally. No worrisome lytic or sclerotic osseous abnormality.  IMPRESSION: 4.3 cm abscess involving the bladder wall at the dome also appears tethered to the mid transverse colon which shows associated wall thickening. There is some  surrounding edema/ inflammation and gas is visible in the urinary bladder. The presence of gas in the bladder is highly suggestive of colovesical fistula, likely secondary to previous diverticulitis.  Probable bilateral renal cysts.  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 Left common iliac artery measures 2.3 cm in diameter.  Insert cholelithiasis.   Electronically Signed   By: Kennith Center M.D.   On: 11/17/2014 13:04    Review of Systems  Constitutional:       +decreased appetite  HENT: Negative.   Eyes: Negative.   Respiratory: Negative.   Cardiovascular: Negative.   Gastrointestinal: Positive for abdominal pain and diarrhea. Negative for nausea, vomiting, constipation, blood in stool and melena.  Genitourinary: Negative for dysuria, urgency, frequency, hematuria and flank pain.       Reports from daughters of both urinary incontinence and difficulty passing urine over past few days  Musculoskeletal: Negative.   Skin: Negative.   Neurological: Negative for dizziness, sensory change, speech change, focal weakness and seizures.       +remote hx of CVA with associated residual memory deficits  Psychiatric/Behavioral: Negative.    Blood pressure 90/44, pulse 60, temperature 97.9 F (36.6 C), temperature source Oral, resp. rate 18, weight 105.291 kg (232 lb 2 oz), SpO2 94 %. Physical Exam  Nursing note and vitals reviewed. Constitutional: He is oriented to person, place, and time. He appears well-developed and well-nourished. No distress.  +talkative and cheerful Is moderately HOH  HENT:  Head: Normocephalic and atraumatic.  Mouth/Throat: Oropharynx is clear and moist.  +edentulous  Eyes: Conjunctivae are normal. No scleral icterus.  Neck: Normal range of motion. Neck supple.  Cardiovascular: Normal rate, regular rhythm and normal heart sounds.   Respiratory: Effort normal and breath sounds normal. No respiratory distress. He has no wheezes.  GI: Soft. Normal appearance and bowel  sounds are normal. There is tenderness in the right lower quadrant and suprapubic area. There is no rigidity, no rebound, no guarding and no CVA tenderness.  Musculoskeletal: Normal range of motion.  Neurological: He is alert  and oriented to person, place, and time.  Skin: Skin is warm and dry.  Psychiatric: He has a normal mood and affect. His behavior is normal.    Assessment: 1. 80 y/o male with intraabdominal abscess involving dome of bladder. ER has begun treatment with Zosyn 2.25 grams IV Q8. Currently afebrile with normal WBC count. No clinical indication of peritonitis or sepsis.  2. Hx of HTN 3.Hx of Hyperlipidemia 4. Hx of PVD 5. Hx of CVA 6. Hx of BPH 7.Hx of umbilical hernia 8. Hx of carotid stenosis, s/p remote right CEA 9. Hx of PEG 10. Renal insufficiency. Creatinine 3.62.  Plan: 1. Admission to medical service with general surgery consultation. 2. Will consider IR percutaneous drainage of abscess with drain placement as treatment option. NPO/bowel rest and IVF for now. Will order foley cath placement. Continue Zosyn.  3. Pain management per medicine  4. Management of chronic medical issues per IM service. 5. Strict I&Os given bladder issue, hx of BPH and current creatinine level.   Will follow daily. Thank you for consultation.    Lahoma Rocker, [facility] Pager [phone] 11/17/2014, 2:42 PM

## 2014-11-17 NOTE — Consult Note (Signed)
Chief Complaint: Chief Complaint  Patient presents with  . Diarrhea  . Abdominal Pain  bladder wall abscess  Referring Physician(s): TRH   History of Present Illness: Robert Barnett is a 80 y.o. male  Pt suffering worsening abd pain x 3-4 days Urinary incontinence since yesterday CT reveals bladder wall abscess- possible diverticulitis Request for abscess drain placement Dr Kathlene Cote has reviewed imaging and chart I have seen and examined pt Now scheduled for abscess drain placement 11/21   Past Medical History  Diagnosis Date  . Hypertension   . Hyperlipidemia   . Peripheral vascular disease   . Carotid artery occlusion   . Arthritis   . Dysphagia   . Stroke     Right hemispheric CVA  . BPH (benign prostatic hyperplasia)   . Umbilical hernia     Past Surgical History  Procedure Laterality Date  . Carotid endarterectomy  10/06/2007    Right CEA by Dr. Amedeo Plenty  . Peg tube placement  2008    Allergies: Review of patient's allergies indicates no known allergies.  Medications: Prior to Admission medications   Medication Sig Start Date End Date Taking? Authorizing Provider  amLODipine-benazepril (LOTREL) 10-20 MG per capsule Take 1 capsule by mouth daily.   Yes Historical Provider, MD  aspirin 81 MG tablet Take 81 mg by mouth daily.   Yes Historical Provider, MD  ciprofloxacin (CIPRO) 250 MG tablet Take 250 mg by mouth 2 (two) times daily.   Yes Historical Provider, MD  clopidogrel (PLAVIX) 75 MG tablet Take 75 mg by mouth daily.   Yes Historical Provider, MD  donepezil (ARICEPT) 5 MG tablet Take 5 mg by mouth at bedtime.   Yes Historical Provider, MD  levothyroxine (SYNTHROID, LEVOTHROID) 50 MCG tablet Take 50 mcg by mouth daily before breakfast.   Yes Historical Provider, MD  LUMIGAN 0.01 % SOLN Place 1 drop into both eyes at bedtime. 10/12/14  Yes Historical Provider, MD  pantoprazole (PROTONIX) 40 MG tablet Take 40 mg by mouth daily.   Yes Historical Provider,  MD  Probiotic Product (ALIGN) 4 MG CAPS Take 1 capsule by mouth daily.   Yes Historical Provider, MD  simvastatin (ZOCOR) 40 MG tablet Take 40 mg by mouth daily.   Yes Historical Provider, MD    History reviewed. No pertinent family history.  History   Social History  . Marital Status: Widowed    Spouse Name: N/A    Number of Children: N/A  . Years of Education: N/A   Social History Main Topics  . Smoking status: Former Research scientist (life sciences)  . Smokeless tobacco: None  . Alcohol Use: No  . Drug Use: No  . Sexual Activity: None   Other Topics Concern  . None   Social History Narrative     Review of Systems: A 12 point ROS discussed and pertinent positives are indicated in the HPI above.  All other systems are negative.  Review of Systems  Constitutional: Positive for activity change and fatigue.  Respiratory: Negative for cough and shortness of breath.   Cardiovascular: Negative for chest pain.  Gastrointestinal: Positive for nausea and abdominal pain.  Genitourinary: Positive for difficulty urinating.  Neurological: Positive for weakness.  Psychiatric/Behavioral: Positive for confusion.    Vital Signs: BP 90/44 mmHg  Pulse 64  Temp(Src) 97.9 F (36.6 C) (Oral)  Resp 17  Wt 105.291 kg (232 lb 2 oz)  SpO2 95%  Physical Exam  Cardiovascular: Normal rate and regular rhythm.   Pulmonary/Chest: Effort  normal and breath sounds normal. He has no wheezes.  Abdominal: Soft. Bowel sounds are normal.  Musculoskeletal: Normal range of motion.  Neurological: He is alert.  Skin: Skin is warm and dry.  Psychiatric:  Mild confusion Consented dtr at bedside    Imaging: Ct Abdomen Pelvis Wo Contrast  11/17/2014   CLINICAL DATA:  Subsequent encounter for abdominal pain and diarrhea. Pain in the right lower quadrant and difficulty urinating.  EXAM: CT ABDOMEN AND PELVIS WITHOUT CONTRAST  TECHNIQUE: Multidetector CT imaging of the abdomen and pelvis was performed following the standard  protocol without IV contrast.  COMPARISON:  05/05/2005  FINDINGS: Lower chest: Compressive atelectasis noted in the dependent lung bases bilaterally.  Hepatobiliary: 9 mm low-density lesion in the dome of the left liver is probably a cyst. Liver with otherwise unremarkable on infused appearance. Multiple calcified gallstones measure up to 7 mm in diameter. No intrahepatic or extrahepatic biliary dilation.  Pancreas: No focal mass lesion. No dilatation of the main duct. No intraparenchymal cyst. No peripancreatic edema.  Spleen: No splenomegaly. No focal mass lesion.  Adrenals/Urinary Tract: No adrenal nodule or mass. 2.5 cm water density lesion in the interpolar right kidney is likely a cyst. 2.4 cm water density lesion in the lower pole the left kidney is also likely a cyst. No evidence for stone disease in either kidney. No evidence for hydroureter or ureteral stone disease. Bladder is mildly distended and shows circumferential bladder wall thickening. Associated with the anterior bladder dome is marked wall thickening with end immediately contiguous 4.3 x 3.8 x 3.8 cm collection of debris and gas. This collection appears tether tube both in the dome of the bladder in the mid sigmoid colon. Surrounding edema/ inflammation is evident. There is a small amount a gas visible in the lumen of the urinary bladder.  Stomach/Bowel: Stomach is nondistended. No gastric wall thickening. No evidence of outlet obstruction. Duodenum is normally positioned as is the ligament of Treitz. No small bowel wall thickening. No small bowel dilatation. Terminal ileum and appendix are normal. Advanced diverticulosis is noted in the sigmoid colon. Please see adrenal urinary tract section above.  Vascular/Lymphatic: Atherosclerotic calcification is noted in the wall of the abdominal aorta without aneurysm. Right common iliac artery measures 2.6 cm in maximum diameter. Left common iliac artery measures up to 2.3 cm in diameter.  No  gastrohepatic ligament or hepatoduodenal ligament lymphadenopathy. No retroperitoneal lymphadenopathy. No evidence for pelvic sidewall lymphadenopathy.  Reproductive: Prostate gland is enlarged.  Other: No intraperitoneal free fluid. Bilateral inguinal hernias contain only fat.  Musculoskeletal: Mild degenerative changes are seen in the hips bilaterally. No worrisome lytic or sclerotic osseous abnormality.  IMPRESSION: 4.3 cm abscess involving the bladder wall at the dome also appears tethered to the mid transverse colon which shows associated wall thickening. There is some surrounding edema/ inflammation and gas is visible in the urinary bladder. The presence of gas in the bladder is highly suggestive of colovesical fistula, likely secondary to previous diverticulitis.  Probable bilateral renal cysts.  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 Left common iliac artery measures 2.3 cm in diameter.  Insert cholelithiasis.   Electronically Signed   By: Misty Stanley M.D.   On: 11/17/2014 13:04   US Abdomen Complete  11/09/2014   CLINICAL DATA:  Abdominal pain and distension  EXAM: ULTRASOUND ABDOMEN COMPLETE  COMPARISON:  10/12/2013.  FINDINGS: Gallbladder: Cholelithiasis is identified. Mild wall thickening is noted. No pericholecystic fluid or sonographic Percell Miller sign is noted. The  wall thickness may be related to partial decompression of the gallbladder.  Common bile duct: Diameter: 3.4 mm.  Liver: No focal lesion identified. Within normal limits in parenchymal echogenicity.  IVC: Not visualized due to overlying bowel gas.  Pancreas: Not visualized due to overlying bowel gas.  Spleen: Size and appearance within normal limits.  Right Kidney: Length: 9.8 cm. 1.6 cm hypoechoic lesion is noted in the upper pole consistent with a cysts. This is stable in appearance from the prior exam. Mild cortical thinning is noted as well as increased echogenicity.  Left Kidney: Length: 11.8 cm. 2 cm complex cyst is noted in the  lower pole. This is stable from the prior exam as well.  Abdominal aorta: No aneurysm visualized.  Other findings: None.  IMPRESSION: Bilateral renal cystic change.  Cholelithiasis   Electronically Signed   By: Inez Catalina M.D.   On: 11/09/2014 12:45    Labs:  CBC:  Recent Labs  11/17/14 1111  WBC 7.0  HGB 12.5*  HCT 38.8*  PLT 136*    COAGS: No results for input(s): INR, APTT in the last 8760 hours.  BMP:  Recent Labs  11/17/14 1111  NA 135*  K 4.3  CL 102  CO2 18*  GLUCOSE 116*  BUN 67*  CALCIUM 8.2*  CREATININE 3.62*  GFRNONAA 15*  GFRAA 17*    LIVER FUNCTION TESTS:  Recent Labs  11/17/14 1111  BILITOT 0.4  AST 58*  ALT 27  ALKPHOS 64  PROT 6.8  ALBUMIN 2.2*    TUMOR MARKERS: No results for input(s): AFPTM, CEA, CA199, CHROMGRNA in the last 8760 hours.  Assessment and Plan:  Pt with abd pain CT shows abscess at bladder wall Now scheduled for drain placement Dtrs aware of procedure benefits and risks and agreeable to proceed Consent signed andin CT  Thank you for this interesting consult.  I greatly enjoyed meeting PIYUSH SALAIZ and look forward to participating in their care.    I spent a total of 40 minutes face to face in clinical consultation, greater than 50% of which was counseling/coordinating care for abscess drain  Signed: Korver Graybeal A 11/17/2014, 4:00 PM

## 2014-11-17 NOTE — Consult Note (Signed)
Reason for Consult:Periviscal Abscess  Referring Physician: Tyler Pita MD  Robert Barnett is an 80 y.o. male.   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 No h/o gross hematuria. Or GU malignancy. Denies fecaluria or pnumaturia but family does describe worsened irritative voiding symptoms x several weeks.  2 - Acute Renal Failure - Cr 3.6 on admission. CT w/o hydro. Cr 2008 <1.5.  Family does admit to very poor PO intake, albumin also very low.   PMH sig for TIA, CEA. Lives alone but has family help.   Today Robert Barnett is seen in consultation for above. Labs and imaging studies independantly reveiwed.   Past Medical History  Diagnosis Date  . Hypertension   . Hyperlipidemia   . Peripheral vascular disease   . Carotid artery occlusion   . Arthritis   . Dysphagia   . Stroke     Right hemispheric CVA  . BPH (benign prostatic hyperplasia)   . Umbilical hernia     Past Surgical History  Procedure Laterality Date  . Carotid endarterectomy  10/06/2007    Right CEA by Dr. Amedeo Plenty  . Peg tube placement  2008    History reviewed. No pertinent family history.  Social History:  reports that he has quit smoking. He does not have any smokeless tobacco history on file. He reports that he does not drink alcohol or use illicit drugs.  Allergies: No Known Allergies  Medications: I have reviewed the patient's current medications.  Results for orders placed or performed during the hospital encounter of 11/17/14 (from the past 48 hour(s))  CBC with Differential     Status: Abnormal   Collection Time: 11/17/14 11:11 AM  Result Value Ref Range   WBC 7.0 4.0 - 10.5 K/uL   RBC 4.20 (L) 4.22 - 5.81 MIL/uL   Hemoglobin 12.5 (L) 13.0 - 17.0 g/dL   HCT 38.8 (L) 39.0 - 52.0 %   MCV  92.4 78.0 - 100.0 fL   MCH 29.8 26.0 - 34.0 pg   MCHC 32.2 30.0 - 36.0 g/dL   RDW 13.9 11.5 - 15.5 %   Platelets 136 (L) 150 - 400 K/uL   Neutrophils Relative % 76 43 - 77 %   Neutro Abs 5.3 1.7 - 7.7 K/uL   Lymphocytes Relative 10 (L) 12 - 46 %   Lymphs Abs 0.7 0.7 - 4.0 K/uL   Monocytes Relative 14 (H) 3 - 12 %   Monocytes Absolute 1.0 0.1 - 1.0 K/uL   Eosinophils Relative 0 0 - 5 %   Eosinophils Absolute 0.0 0.0 - 0.7 K/uL   Basophils Relative 0 0 - 1 %   Basophils Absolute 0.0 0.0 - 0.1 K/uL  Comprehensive metabolic panel     Status: Abnormal   Collection Time: 11/17/14 11:11 AM  Result Value Ref Range   Sodium 135 (L) 137 - 147 mEq/L   Potassium 4.3 3.7 - 5.3 mEq/L   Chloride 102 96 - 112 mEq/L   CO2 18 (L) 19 - 32 mEq/L   Glucose, Bld 116 (H) 70 - 99 mg/dL   BUN 67 (H) 6 - 23 mg/dL   Creatinine, Ser 3.62 (H) 0.50 - 1.35 mg/dL   Calcium 8.2 (L) 8.4 - 10.5 mg/dL   Total Protein  6.8 6.0 - 8.3 g/dL   Albumin 2.2 (L) 3.5 - 5.2 g/dL   AST 58 (H) 0 - 37 U/L   ALT 27 0 - 53 U/L   Alkaline Phosphatase 64 39 - 117 U/L   Total Bilirubin 0.4 0.3 - 1.2 mg/dL   GFR calc non Af Amer 15 (L) >90 mL/min   GFR calc Af Amer 17 (L) >90 mL/min    Comment: (NOTE) The eGFR has been calculated using the CKD EPI equation. This calculation has not been validated in all clinical situations. eGFR's persistently <90 mL/min signify possible Chronic Kidney Disease.    Anion gap 15 5 - 15  Lipase, blood     Status: Abnormal   Collection Time: 11/17/14 11:11 AM  Result Value Ref Range   Lipase 119 (H) 11 - 59 U/L  Urinalysis, Routine w reflex microscopic     Status: Abnormal   Collection Time: 11/17/14 12:49 PM  Result Value Ref Range   Color, Urine YELLOW YELLOW   APPearance CLOUDY (A) CLEAR   Specific Gravity, Urine 1.016 1.005 - 1.030   pH 5.0 5.0 - 8.0   Glucose, UA NEGATIVE NEGATIVE mg/dL   Hgb urine dipstick MODERATE (A) NEGATIVE   Bilirubin Urine NEGATIVE NEGATIVE   Ketones, ur  NEGATIVE NEGATIVE mg/dL   Protein, ur 30 (A) NEGATIVE mg/dL   Urobilinogen, UA 0.2 0.0 - 1.0 mg/dL   Nitrite NEGATIVE NEGATIVE   Leukocytes, UA LARGE (A) NEGATIVE  Urine microscopic-add on     Status: Abnormal   Collection Time: 11/17/14 12:49 PM  Result Value Ref Range   Squamous Epithelial / LPF FEW (A) RARE   WBC, UA TOO NUMEROUS TO COUNT <3 WBC/hpf   RBC / HPF 3-6 <3 RBC/hpf   Bacteria, UA FEW (A) RARE   Casts GRANULAR CAST (A) NEGATIVE  Urinalysis, Routine w reflex microscopic     Status: Abnormal   Collection Time: 11/17/14  3:59 PM  Result Value Ref Range   Color, Urine YELLOW YELLOW   APPearance TURBID (A) CLEAR   Specific Gravity, Urine 1.015 1.005 - 1.030   pH 5.5 5.0 - 8.0   Glucose, UA NEGATIVE NEGATIVE mg/dL   Hgb urine dipstick LARGE (A) NEGATIVE   Bilirubin Urine NEGATIVE NEGATIVE   Ketones, ur NEGATIVE NEGATIVE mg/dL   Protein, ur 30 (A) NEGATIVE mg/dL   Urobilinogen, UA 0.2 0.0 - 1.0 mg/dL   Nitrite NEGATIVE NEGATIVE   Leukocytes, UA LARGE (A) NEGATIVE  Urine microscopic-add on     Status: Abnormal   Collection Time: 11/17/14  3:59 PM  Result Value Ref Range   Squamous Epithelial / LPF RARE RARE   WBC, UA TOO NUMEROUS TO COUNT <3 WBC/hpf   RBC / HPF 0-2 <3 RBC/hpf   Bacteria, UA MANY (A) RARE  APTT     Status: None   Collection Time: 11/17/14  4:21 PM  Result Value Ref Range   aPTT 29 24 - 37 seconds  Protime-INR     Status: None   Collection Time: 11/17/14  4:21 PM  Result Value Ref Range   Prothrombin Time 14.3 11.6 - 15.2 seconds   INR 1.09 0.00 - 1.49    Ct Abdomen Pelvis Wo Contrast  11/17/2014   CLINICAL DATA:  Subsequent encounter for abdominal pain and diarrhea. Pain in the right lower quadrant and difficulty urinating.  EXAM: CT ABDOMEN AND PELVIS WITHOUT CONTRAST  TECHNIQUE: Multidetector CT imaging of the abdomen and pelvis was performed following  the standard protocol without IV contrast.  COMPARISON:  05/05/2005  FINDINGS: Lower chest:  Compressive atelectasis noted in the dependent lung bases bilaterally.  Hepatobiliary: 9 mm low-density lesion in the dome of the left liver is probably a cyst. Liver with otherwise unremarkable on infused appearance. Multiple calcified gallstones measure up to 7 mm in diameter. No intrahepatic or extrahepatic biliary dilation.  Pancreas: No focal mass lesion. No dilatation of the main duct. No intraparenchymal cyst. No peripancreatic edema.  Spleen: No splenomegaly. No focal mass lesion.  Adrenals/Urinary Tract: No adrenal nodule or mass. 2.5 cm water density lesion in the interpolar right kidney is likely a cyst. 2.4 cm water density lesion in the lower pole the left kidney is also likely a cyst. No evidence for stone disease in either kidney. No evidence for hydroureter or ureteral stone disease. Bladder is mildly distended and shows circumferential bladder wall thickening. Associated with the anterior bladder dome is marked wall thickening with end immediately contiguous 4.3 x 3.8 x 3.8 cm collection of debris and gas. This collection appears tether tube both in the dome of the bladder in the mid sigmoid colon. Surrounding edema/ inflammation is evident. There is a small amount a gas visible in the lumen of the urinary bladder.  Stomach/Bowel: Stomach is nondistended. No gastric wall thickening. No evidence of outlet obstruction. Duodenum is normally positioned as is the ligament of Treitz. No small bowel wall thickening. No small bowel dilatation. Terminal ileum and appendix are normal. Advanced diverticulosis is noted in the sigmoid colon. Please see adrenal urinary tract section above.  Vascular/Lymphatic: Atherosclerotic calcification is noted in the wall of the abdominal aorta without aneurysm. Right common iliac artery measures 2.6 cm in maximum diameter. Left common iliac artery measures up to 2.3 cm in diameter.  No gastrohepatic ligament or hepatoduodenal ligament lymphadenopathy. No retroperitoneal  lymphadenopathy. No evidence for pelvic sidewall lymphadenopathy.  Reproductive: Prostate gland is enlarged.  Other: No intraperitoneal free fluid. Bilateral inguinal hernias contain only fat.  Musculoskeletal: Mild degenerative changes are seen in the hips bilaterally. No worrisome lytic or sclerotic osseous abnormality.  IMPRESSION: 4.3 cm abscess involving the bladder wall at the dome also appears tethered to the mid transverse colon which shows associated wall thickening. There is some surrounding edema/ inflammation and gas is visible in the urinary bladder. The presence of gas in the bladder is highly suggestive of colovesical fistula, likely secondary to previous diverticulitis.  Probable bilateral renal cysts.  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 Left common iliac artery measures 2.3 cm in diameter.  Insert cholelithiasis.   Electronically Signed   By: Misty Stanley M.D.   On: 11/17/2014 13:04    Review of Systems  Constitutional: Negative.  Negative for fever and chills.  HENT: Negative.   Eyes: Negative.   Respiratory: Negative.   Cardiovascular: Negative.   Gastrointestinal: Positive for abdominal pain.  Genitourinary: Positive for dysuria. Negative for hematuria and flank pain.  Musculoskeletal: Negative.   Skin: Negative.   Neurological: Negative.   Endo/Heme/Allergies: Negative.   Psychiatric/Behavioral: Negative.    Blood pressure 113/66, pulse 67, temperature 98 F (36.7 C), temperature source Oral, resp. rate 18, weight 105.291 kg (232 lb 2 oz), SpO2 96 %. Physical Exam  Constitutional: He appears well-developed.  Family at bedside  HENT:  Head: Normocephalic.  Eyes: Pupils are equal, round, and reactive to light.  Neck: Normal range of motion. Neck supple.  Cardiovascular: Normal rate.   Respiratory: Effort normal.  GI: Soft.  Mild TTP periumbilical / infraumbilical  Genitourinary: Penis normal.  Foley c/d/i with turbid urine. No gross stool.   Musculoskeletal:  Normal range of motion.  Neurological: He is alert.  AOx2  Skin: Skin is warm.  Psychiatric: He has a normal mood and affect. His behavior is normal.    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 Discussed with [facility]se[facility] of general surgery and we both agree initial treatment of choice bladder drainage (foley) + abscess drain (IR guided) and ABX. I feel foley needs to be in place at least 2 weeks to allow bladder healing with cystogram prior to removal to confirm no gross bladder leaks.  Should fail conservative measures we are happy to participate in combined surgical approach if needed for cystotomy closure, though abdominal exploration would certainly have significant morbidity including high likelihood of permanent proximal colostomy.  Plan and approach explained to family as well as implicaitons of possible DC with tubes / drains with need for additional family support or even NH / SNF stay before ready for home again.  2 - Acute Renal Failure - likely pre-renal / intrinsic by history and imaging. Hopefully this will improve with hydration.   3 - Will Follow prn while in house, call anytime with questions.      Albena Comes 11/17/2014, 5:57 PM

## 2014-11-18 ENCOUNTER — Inpatient Hospital Stay (HOSPITAL_COMMUNITY): Payer: Medicare PPO

## 2014-11-18 LAB — BASIC METABOLIC PANEL
Anion gap: 18 — ABNORMAL HIGH (ref 5–15)
BUN: 56 mg/dL — ABNORMAL HIGH (ref 6–23)
CALCIUM: 8.2 mg/dL — AB (ref 8.4–10.5)
CO2: 17 mEq/L — ABNORMAL LOW (ref 19–32)
CREATININE: 2.98 mg/dL — AB (ref 0.50–1.35)
Chloride: 108 mEq/L (ref 96–112)
GFR calc Af Amer: 21 mL/min — ABNORMAL LOW (ref >90)
GFR, EST NON AFRICAN AMERICAN: 18 mL/min — AB (ref >90)
GLUCOSE: 86 mg/dL (ref 70–99)
Potassium: 4.7 mEq/L (ref 3.7–5.3)
SODIUM: 143 meq/L (ref 137–147)

## 2014-11-18 LAB — CBC
HEMATOCRIT: 39.1 % (ref 39.0–52.0)
Hemoglobin: 12.7 g/dL — ABNORMAL LOW (ref 13.0–17.0)
MCH: 30.5 pg (ref 26.0–34.0)
MCHC: 32.5 g/dL (ref 30.0–36.0)
MCV: 93.8 fL (ref 78.0–100.0)
Platelets: 135 10*3/uL — ABNORMAL LOW (ref 150–400)
RBC: 4.17 MIL/uL — ABNORMAL LOW (ref 4.22–5.81)
RDW: 13.6 % (ref 11.5–15.5)
WBC: 7.8 10*3/uL (ref 4.0–10.5)

## 2014-11-18 LAB — URINE CULTURE
Colony Count: NO GROWTH
Culture: NO GROWTH

## 2014-11-18 MED ORDER — FENTANYL CITRATE 0.05 MG/ML IJ SOLN
INTRAMUSCULAR | Status: AC | PRN
  Administered 2014-11-18: 50 ug via INTRAVENOUS
  Administered 2014-11-18: 25 ug via INTRAVENOUS

## 2014-11-18 MED ORDER — MIDAZOLAM HCL 2 MG/2ML IJ SOLN
INTRAMUSCULAR | Status: AC
  Filled 2014-11-18: qty 4

## 2014-11-18 MED ORDER — LIDOCAINE HCL 1 % IJ SOLN
INTRAMUSCULAR | Status: AC
  Filled 2014-11-18: qty 20

## 2014-11-18 MED ORDER — FENTANYL CITRATE 0.05 MG/ML IJ SOLN
INTRAMUSCULAR | Status: AC
  Filled 2014-11-18: qty 4

## 2014-11-18 MED ORDER — MIDAZOLAM HCL 2 MG/2ML IJ SOLN
INTRAMUSCULAR | Status: AC | PRN
  Administered 2014-11-18 (×2): 1 mg via INTRAVENOUS

## 2014-11-18 NOTE — Procedures (Signed)
Procedure:  CT guided percutaneous catheter drainage of pelvic abscess Findings:  Purulent and feculent fluid aspirated from abscess just superior to bladder dome. 10 Fr perc drain placed in collection. Connected to suction bulb.  Will follow.

## 2014-11-18 NOTE — Progress Notes (Signed)
Subjective: Pt received drain this AM.  Doing OK.  No n/v.  Family reports that he has been pulling at his IVs.    Objective: Vital signs in last 24 hours: Temp:  [97.7 F (36.5 C)-98.4 F (36.9 C)] 98.4 F (36.9 C) (11/21 0436) Pulse Rate:  [59-75] 59 (11/21 1130) Resp:  [11-23] 16 (11/21 1041) BP: (90-129)/(41-85) 109/56 mmHg (11/21 1130) SpO2:  [91 %-97 %] 94 % (11/21 1041) Last BM Date: 11/16/14  Intake/Output from previous day: 11/20 0701 - 11/21 0700 In: -  Out: 1425 [Urine:1425] Intake/Output this shift: Total I/O In: 0  Out: 250 [Urine:250]  General appearance: alert, cooperative and no distress Resp: breathing comfortably GI: soft, non distended. tender LLQ.  Suprapubic drain murky bloody appearing.   Extremities: extremities normal, atraumatic, no cyanosis or edema  Lab Results:   Recent Labs  11/17/14 1111 11/18/14 0537  WBC 7.0 7.8  HGB 12.5* 12.7*  HCT 38.8* 39.1  PLT 136* 135*   BMET  Recent Labs  11/17/14 1111 11/18/14 0537  NA 135* 143  K 4.3 4.7  CL 102 108  CO2 18* 17*  GLUCOSE 116* 86  BUN 67* 56*  CREATININE 3.62* 2.98*  CALCIUM 8.2* 8.2*   PT/INR  Recent Labs  11/17/14 1621  LABPROT 14.3  INR 1.09   ABG No results for input(s): PHART, HCO3 in the last 72 hours.  Invalid input(s): PCO2, PO2  Studies/Results: Ct Abdomen Pelvis Wo Contrast  11/17/2014   CLINICAL DATA:  Subsequent encounter for abdominal pain and diarrhea. Pain in the right lower quadrant and difficulty urinating.  EXAM: CT ABDOMEN AND PELVIS WITHOUT CONTRAST  TECHNIQUE: Multidetector CT imaging of the abdomen and pelvis was performed following the standard protocol without IV contrast.  COMPARISON:  05/05/2005  FINDINGS: Lower chest: Compressive atelectasis noted in the dependent lung bases bilaterally.  Hepatobiliary: 9 mm low-density lesion in the dome of the left liver is probably a cyst. Liver with otherwise unremarkable on infused appearance. Multiple  calcified gallstones measure up to 7 mm in diameter. No intrahepatic or extrahepatic biliary dilation.  Pancreas: No focal mass lesion. No dilatation of the main duct. No intraparenchymal cyst. No peripancreatic edema.  Spleen: No splenomegaly. No focal mass lesion.  Adrenals/Urinary Tract: No adrenal nodule or mass. 2.5 cm water density lesion in the interpolar right kidney is likely a cyst. 2.4 cm water density lesion in the lower pole the left kidney is also likely a cyst. No evidence for stone disease in either kidney. No evidence for hydroureter or ureteral stone disease. Bladder is mildly distended and shows circumferential bladder wall thickening. Associated with the anterior bladder dome is marked wall thickening with end immediately contiguous 4.3 x 3.8 x 3.8 cm collection of debris and gas. This collection appears tether tube both in the dome of the bladder in the mid sigmoid colon. Surrounding edema/ inflammation is evident. There is a small amount a gas visible in the lumen of the urinary bladder.  Stomach/Bowel: Stomach is nondistended. No gastric wall thickening. No evidence of outlet obstruction. Duodenum is normally positioned as is the ligament of Treitz. No small bowel wall thickening. No small bowel dilatation. Terminal ileum and appendix are normal. Advanced diverticulosis is noted in the sigmoid colon. Please see adrenal urinary tract section above.  Vascular/Lymphatic: Atherosclerotic calcification is noted in the wall of the abdominal aorta without aneurysm. Right common iliac artery measures 2.6 cm in maximum diameter. Left common iliac artery measures up to 2.3  cm in diameter.  No gastrohepatic ligament or hepatoduodenal ligament lymphadenopathy. No retroperitoneal lymphadenopathy. No evidence for pelvic sidewall lymphadenopathy.  Reproductive: Prostate gland is enlarged.  Other: No intraperitoneal free fluid. Bilateral inguinal hernias contain only fat.  Musculoskeletal: Mild degenerative  changes are seen in the hips bilaterally. No worrisome lytic or sclerotic osseous abnormality.  IMPRESSION: 4.3 cm abscess involving the bladder wall at the dome also appears tethered to the mid transverse colon which shows associated wall thickening. There is some surrounding edema/ inflammation and gas is visible in the urinary bladder. The presence of gas in the bladder is highly suggestive of colovesical fistula, likely secondary to previous diverticulitis.  Probable bilateral renal cysts.  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 Left common iliac artery measures 2.3 cm in diameter.  Insert cholelithiasis.   Electronically Signed   By: Misty Stanley M.D.   On: 11/17/2014 13:04   Ct Image Guided Drainage By Percutaneous Catheter  11/18/2014   CLINICAL DATA:  Focal abscess adjacent to sigmoid colon and likely involving bladder wall with clinical evidence of colovesical fistula. The patient presents for percutaneous drainage of the perivesical/diverticular abscess.  EXAM: CT GUIDED DRAINAGE OF PERITONEAL ABSCESS  ANESTHESIA/SEDATION: 2.0 Mg IV Versed 100 mcg IV Fentanyl  Total Moderate Sedation Time:  15 minutes  PROCEDURE: The procedure, risks, benefits, and alternatives were explained to the patient. Questions regarding the procedure were encouraged and answered. The patient understands and consents to the procedure.  The left lower anterior abdominal wall was prepped with Betadine in a sterile fashion, and a sterile drape was applied covering the operative field. A sterile gown and sterile gloves were used for the procedure. Local anesthesia was provided with 1% Lidocaine. A time-out procedure was performed.  CT was performed in a supine position. Under CT fluoroscopic guidance, an 18 gauge trocar needle was advanced to the level of the perivesical abscess. After aspirating fluid, a guidewire was advanced into the collection. The tract was dilated and a 10 French percutaneous drain placed. Drainage catheter  position was confirmed by CT.  The catheter was further aspirated and flushed with saline. A fluid sample was sent for culture analysis. The catheter was connected to a suction bulb. It was secured at the skin with a Prolene retention suture adhesive StatLock device.  COMPLICATIONS: None  FINDINGS: Abscess cavity located just anterior to the bladder dome and abutting the posterior margin of the mid sigmoid colon now contains more air compared to the prior CT and an air-fluid level measuring approximately 3 cm transversely. The bladder has been decompressed by a Foley catheter and demonstrates wall thickening. The bladder also contains some air with findings again suggestive of a colovesical fistula.  Aspirated fluid at the level of abscess was grossly purulent and feculent. After placement of a drainage catheter, there is good return of fluid with suction bulb drainage.  IMPRESSION: CT-guided percutaneous catheter drainage of pelvic diverticular/perivesical abscess. Grossly purulent and feculent appearing fluid was aspirated and sent for culture. A 10 French drain was placed and attached to suction bulb drainage. Output will be followed.   Electronically Signed   By: Aletta Edouard M.D.   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  Drain. Surgery planning per Dr. Tresa Moore in conjunction with Dr.Ramirez.  It sounds like he may need to be d/cd with drain and work on nutrition.   If true fistula, he will need resection and repair. Surgery and urology following.  Will see how he does with drain.    LOS: 1 day    [facility] 11/18/2014

## 2014-11-18 NOTE — Progress Notes (Addendum)
[facility] PROGRESS NOTE  Robert Barnett [id number] DOB: Feb 25, 1932 DOA: 11/17/2014 PCP: Delphina Cahill, MD  Assessment/Plan: Principal Problem:   Abdominal abscess Active Problems:   UTI (urinary tract infection)   Hypotension   Diarrhea   Hyperlipidemia   Dysphagia   Stroke   BPH (benign prostatic hyperplasia)      Abdominal abscess involving dome of the bladder, colovesical fistula possibly from complication of untreated previous diverticulitis - Gen. surgery has been consulted, recommended IR consult with percutaneous drainage of the abscess, drain placement. IR consulted Continue IV Zosyn, pain control CT guided percutaneous catheter drainage of pelvic abscess on 11/21 Surgery planning per Dr. Tresa Moore in conjunction with Dr.Ramirez. If true fistula, he will need resection and repair. Surgery and urology following. Will see how he does with drain. - Urology also has been consulted (per surgery PA, Dr Georgette Dover discussed with on-call urology)     UTI (urinary tract infection) - Obtain urine cultures, continue IV Zosyn   Hypotension - Likely due to #1, hold amlodipine, benazepril, continue IV fluids   Diarrhea - Likely due to #1, obtain C. difficile PCR, GI pathogen panel   Hyperlipidemia - Continue statin   Dysphagia- currently on clear liquid    Stroke - Continue aspirin, hold Plavix until percutaneous drain procedure is completed   BPH (benign prostatic hyperplasia) -Foley catheter to be placed    Code Status: full Family Communication: family updated about patient's clinical progress Disposition Plan:  As above    Brief narrative: 80 year old male with hypertension, hyperlipidemia, prior stroke, dysphagia, BPH presented with abdominal pain and diarrhea. 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 Patient's daughters provided most of the history. Patient's daughter reported that he had been complaining of abdominal  pain, lower, and suprapubic region with urinary incontinence and diarrhea. Patient went to his PCP, abdominal ultrasound showed cholelithiasis with bilateral renal cysts. Patient denied any fevers, chills or nausea or vomiting. Patient's abdominal pain did not improve and he was brought to ED for further evaluation. 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   Gen. surgery was consulted, recommended medical admission, IV fluids, antibiotics and IR consult for percutaneous drainage of the abscess  Consultants:  General surgery  Interventional radiology  Procedures:  Percutaneous drain placement space 11/21  Antibiotics:  Zosyn 11/20  HPI/Subjective: Pt received drain this AM. Doing OK. No n/v. Family reports that he has been pulling at his IVs  Objective: Filed Vitals:   11/18/14 1036 11/18/14 1041 11/18/14 1126 11/18/14 1130  BP: 129/54 127/60 116/55 109/56  Pulse: 75 69 61 59  Temp:      TempSrc:      Resp: 16 16    Weight:      SpO2: 92% 94%      Intake/Output Summary (Last 24 hours) at 11/18/14 1151 Last data filed at 11/18/14 0939  Gross per 24 hour  Intake      0 ml  Output   1675 ml  Net  -1675 ml    Exam:  General appearance: alert, cooperative and no distress Resp: breathing comfortably GI: soft, non distended. tender LLQ. Suprapubic drain murky bloody appearing.  Extremities: extremities normal, atraumatic, no cyanosis or edema  Data Reviewed: Basic Metabolic Panel:  Recent Labs Lab 11/17/14 1111 11/18/14 0537  NA 135* 143  K 4.3 4.7  CL 102 108  CO2 18* 17*  GLUCOSE 116* 86  BUN 67* 56*  CREATININE 3.62* 2.98*  CALCIUM 8.2* 8.2*     Liver Function Tests:  Recent Labs Lab 11/17/14 1111  AST 58*  ALT 27  ALKPHOS 64  BILITOT 0.4  PROT 6.8  ALBUMIN 2.2*    Recent Labs Lab 11/17/14 1111  LIPASE 119*   No results for input(s): AMMONIA in the last 168 hours.  CBC:  Recent Labs Lab 11/17/14 1111 11/18/14 0537  WBC 7.0 7.8  NEUTROABS 5.3  --   HGB 12.5* 12.7*  HCT 38.8* 39.1  MCV 92.4 93.8  PLT 136* 135*    Cardiac Enzymes: No results for input(s): CKTOTAL, CKMB, CKMBINDEX, TROPONINI in the last 168 hours. BNP (last 3 results) No results for input(s): PROBNP in the last 8760 hours.   CBG: No results for input(s): GLUCAP in the last 168 hours.  No results found for this or any previous visit (from the past 240 hour(s)).   Studies: Ct Abdomen Pelvis Wo Contrast  11/17/2014   CLINICAL DATA:  Subsequent encounter for abdominal pain and diarrhea. Pain in the right lower quadrant and difficulty urinating.  EXAM: CT ABDOMEN AND PELVIS WITHOUT CONTRAST  TECHNIQUE: Multidetector CT imaging of the abdomen and pelvis was performed following the standard protocol without IV contrast.  COMPARISON:  05/05/2005  FINDINGS: Lower chest: Compressive atelectasis noted in the dependent lung bases bilaterally.  Hepatobiliary: 9 mm low-density lesion in the dome of the left liver is probably a cyst. Liver with otherwise unremarkable on infused appearance. Multiple calcified gallstones measure up to 7 mm in diameter. No intrahepatic or extrahepatic biliary dilation.  Pancreas: No focal mass lesion. No dilatation of the main duct. No intraparenchymal cyst. No peripancreatic edema.  Spleen: No splenomegaly. No focal mass lesion.  Adrenals/Urinary Tract: No adrenal nodule or mass. 2.5 cm water density lesion in the interpolar right kidney is likely a cyst. 2.4 cm water density lesion in the lower pole the left kidney is also likely a cyst. No evidence for stone disease in either kidney. No evidence for hydroureter or ureteral  stone disease. Bladder is mildly distended and shows circumferential bladder wall thickening. Associated with the anterior bladder dome is marked wall thickening with end immediately contiguous 4.3 x 3.8 x 3.8 cm collection of debris and gas. This collection appears tether tube both in the dome of the bladder in the mid sigmoid colon. Surrounding edema/ inflammation is evident. There is a small amount a gas visible in the lumen of the urinary bladder.  Stomach/Bowel: Stomach is nondistended. No gastric wall thickening. No evidence of outlet obstruction. Duodenum is normally positioned as is the ligament of Treitz. No small bowel wall thickening. No small bowel dilatation. Terminal ileum and appendix are normal. Advanced diverticulosis is noted in the sigmoid colon. Please see adrenal urinary tract section above.  Vascular/Lymphatic: Atherosclerotic calcification is noted in the wall of the abdominal aorta without aneurysm. Right common iliac artery measures 2.6 cm in maximum diameter. Left common iliac artery measures up to 2.3 cm in diameter.  No gastrohepatic ligament or hepatoduodenal ligament lymphadenopathy. No retroperitoneal lymphadenopathy. No evidence for pelvic sidewall lymphadenopathy.  Reproductive: Prostate gland is enlarged.  Other: No intraperitoneal free fluid. Bilateral inguinal hernias contain only fat.  Musculoskeletal: Mild degenerative changes are seen in the hips bilaterally.  No worrisome lytic or sclerotic osseous abnormality.  IMPRESSION: 4.3 cm abscess involving the bladder wall at the dome also appears tethered to the mid transverse colon which shows associated wall thickening. There is some surrounding edema/ inflammation and gas is visible in the urinary bladder. The presence of gas in the bladder is highly suggestive of colovesical fistula, likely secondary to previous diverticulitis.  Probable bilateral renal cysts.  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 Left common iliac artery  measures 2.3 cm in diameter.  Insert cholelithiasis.   Electronically Signed   By: Misty Stanley M.D.   On: 11/17/2014 13:04   US Abdomen Complete  11/09/2014   CLINICAL DATA:  Abdominal pain and distension  EXAM: ULTRASOUND ABDOMEN COMPLETE  COMPARISON:  10/12/2013.  FINDINGS: Gallbladder: Cholelithiasis is identified. Mild wall thickening is noted. No pericholecystic fluid or sonographic Percell Miller sign is noted. The wall thickness may be related to partial decompression of the gallbladder.  Common bile duct: Diameter: 3.4 mm.  Liver: No focal lesion identified. Within normal limits in parenchymal echogenicity.  IVC: Not visualized due to overlying bowel gas.  Pancreas: Not visualized due to overlying bowel gas.  Spleen: Size and appearance within normal limits.  Right Kidney: Length: 9.8 cm. 1.6 cm hypoechoic lesion is noted in the upper pole consistent with a cysts. This is stable in appearance from the prior exam. Mild cortical thinning is noted as well as increased echogenicity.  Left Kidney: Length: 11.8 cm. 2 cm complex cyst is noted in the lower pole. This is stable from the prior exam as well.  Abdominal aorta: No aneurysm visualized.  Other findings: None.  IMPRESSION: Bilateral renal cystic change.  Cholelithiasis   Electronically Signed   By: Inez Catalina M.D.   On: 11/09/2014 12:45   Ct Image Guided Drainage By Percutaneous Catheter  11/18/2014   CLINICAL DATA:  Focal abscess adjacent to sigmoid colon and likely involving bladder wall with clinical evidence of colovesical fistula. The patient presents for percutaneous drainage of the perivesical/diverticular abscess.  EXAM: CT GUIDED DRAINAGE OF PERITONEAL ABSCESS  ANESTHESIA/SEDATION: 2.0 Mg IV Versed 100 mcg IV Fentanyl  Total Moderate Sedation Time:  15 minutes  PROCEDURE: The procedure, risks, benefits, and alternatives were explained to the patient. Questions regarding the procedure were encouraged and answered. The patient understands and  consents to the procedure.  The left lower anterior abdominal wall was prepped with Betadine in a sterile fashion, and a sterile drape was applied covering the operative field. A sterile gown and sterile gloves were used for the procedure. Local anesthesia was provided with 1% Lidocaine. A time-out procedure was performed.  CT was performed in a supine position. Under CT fluoroscopic guidance, an 18 gauge trocar needle was advanced to the level of the perivesical abscess. After aspirating fluid, a guidewire was advanced into the collection. The tract was dilated and a 10 French percutaneous drain placed. Drainage catheter position was confirmed by CT.  The catheter was further aspirated and flushed with saline. A fluid sample was sent for culture analysis. The catheter was connected to a suction bulb. It was secured at the skin with a Prolene retention suture adhesive StatLock device.  COMPLICATIONS: None  FINDINGS: Abscess cavity located just anterior to the bladder dome and abutting the posterior margin of the mid sigmoid colon now contains more air compared to the prior CT and an air-fluid level measuring approximately 3 cm transversely. The bladder has been decompressed by a Foley catheter and demonstrates wall  thickening. The bladder also contains some air with findings again suggestive of a colovesical fistula.  Aspirated fluid at the level of abscess was grossly purulent and feculent. After placement of a drainage catheter, there is good return of fluid with suction bulb drainage.  IMPRESSION: CT-guided percutaneous catheter drainage of pelvic diverticular/perivesical abscess. Grossly purulent and feculent appearing fluid was aspirated and sent for culture. A 10 French drain was placed and attached to suction bulb drainage. Output will be followed.   Electronically Signed   By: Aletta Edouard M.D.   On: 11/18/2014 11:29    Scheduled Meds: . aspirin EC  81 mg Oral Daily  . donepezil  5 mg Oral QHS  .  fentaNYL      . latanoprost  1 drop Both Eyes QHS  . levothyroxine  50 mcg Oral QAC breakfast  . lidocaine      . midazolam      . pantoprazole  40 mg Oral Daily  . piperacillin-tazobactam (ZOSYN)  IV  2.25 g Intravenous 3 times per day  . saccharomyces boulardii  250 mg Oral Daily  . simvastatin  40 mg Oral q1800   Continuous Infusions: . sodium chloride 100 mL/hr at 11/18/14 0132    Principal Problem:   Abdominal abscess Active Problems:   UTI (urinary tract infection)   Hypotension   Diarrhea   Hyperlipidemia   Dysphagia   Stroke   BPH (benign prostatic hyperplasia)    Time spent: 40 minutes   [facility] Pager [phone]. If 7PM-7AM, please contact night-coverage at [email or web address], password [facility] 11/18/2014, 11:51 AM  LOS: 1 day

## 2014-11-18 NOTE — Sedation Documentation (Signed)
Successful abscess drain placed.

## 2014-11-19 LAB — COMPREHENSIVE METABOLIC PANEL
ALBUMIN: 2.1 g/dL — AB (ref 3.5–5.2)
ALT: 20 U/L (ref 0–53)
ANION GAP: 17 — AB (ref 5–15)
AST: 36 U/L (ref 0–37)
Alkaline Phosphatase: 60 U/L (ref 39–117)
BUN: 40 mg/dL — AB (ref 6–23)
CALCIUM: 7.9 mg/dL — AB (ref 8.4–10.5)
CO2: 18 mEq/L — ABNORMAL LOW (ref 19–32)
CREATININE: 2.61 mg/dL — AB (ref 0.50–1.35)
Chloride: 110 mEq/L (ref 96–112)
GFR calc non Af Amer: 22 mL/min — ABNORMAL LOW (ref >90)
GFR, EST AFRICAN AMERICAN: 25 mL/min — AB (ref >90)
GLUCOSE: 95 mg/dL (ref 70–99)
Potassium: 4.4 mEq/L (ref 3.7–5.3)
Sodium: 145 mEq/L (ref 137–147)
TOTAL PROTEIN: 6.3 g/dL (ref 6.0–8.3)
Total Bilirubin: 0.3 mg/dL (ref 0.3–1.2)

## 2014-11-19 MED ORDER — PIPERACILLIN-TAZOBACTAM 3.375 G IVPB
3.3750 g | Freq: Three times a day (TID) | INTRAVENOUS | Status: DC
  Administered 2014-11-19 – 2014-11-21 (×5): 3.375 g via INTRAVENOUS
  Filled 2014-11-19 (×7): qty 50

## 2014-11-19 NOTE — Progress Notes (Signed)
[facility] PROGRESS NOTE  LILY DUTKO [id number] DOB: [birth year] DOA: 11/17/2014 PCP: Delphina Cahill, MD  Assessment/Plan: 80 y/o male with PMH of HTN, BPH, prior h/o CVA, dementia presented with diarrhea, abd pain found to Sigmoid diverticulitis with abscess, possible colovesical fistula   1. Abdominal abscess involving dome of the bladder, colovesical fistula possibly from complication of untreated previous diverticulitis -11/21: s/p perc drain by IR; continue IV Zosyn, pain control; pend cultures ; appreciate surgery, urology, IR input  2. UTI (urinary tract infection), pend cultures, continue IV Zosyn 3. Hypotension Likely due to #1, hold amlodipine, benazepril, continue IV fluids 4. Diarrhea Likely due to #1, obtain C. difficile PCR, GI pathogen panel-pend  5. H/o Stroke, on ASA/plavix - Continue aspirin, plavix was on hold for procedure; will resume if no surgical intervention planned  6. BPH (benign prostatic hyperplasia); on foley catheter 7. AKI likely prerenal; volume loss diarrhea;  -Cr improving; cont gentle IVF; monitor urine output; Cr    Code Status: full Family Communication: d/w patient, his daughter  (indicate person spoken with, relationship, and if by phone, the number) Disposition Plan: pend clinical improvement   Consultants:  General surgery  Interventional radiology  Procedures:  Percutaneous drain placement space 11/21  Antibiotics:  Zosyn 11/20 HPI/Subjective: alert  Objective: Filed Vitals:   11/19/14 0420  BP: 129/70  Pulse: 82  Temp: 98.5 F (36.9 C)  Resp: 18    Intake/Output Summary (Last 24 hours) at 11/19/14 1300 Last data filed at 11/19/14 [id number]  Gross per 24 hour  Intake   2220 ml  Output   1710 ml  Net    510 ml   Filed Weights    Exam:   General:  alert  Cardiovascular: s1,s2 rrr  Respiratory: CTA BL  Abdomen: soft, nt,nd   Musculoskeletal: no LE edema   Data Reviewed: Basic Metabolic  Panel:  Recent Labs Lab 11/17/14 1111 11/18/14 0537 11/19/14 0410  NA 135* 143 145  K 4.3 4.7 4.4  CL 102 108 110  CO2 18* 17* 18*  GLUCOSE 116* 86 95  BUN 67* 56* 40*  CREATININE 3.62* 2.98* 2.61*  CALCIUM 8.2* 8.2* 7.9*   Liver Function Tests:  Recent Labs Lab 11/17/14 1111 11/19/14 0410  AST 58* 36  ALT 27 20  ALKPHOS 64 60  BILITOT 0.4 0.3  PROT 6.8 6.3  ALBUMIN 2.2* 2.1*    Recent Labs Lab 11/17/14 1111  LIPASE 119*   No results for input(s): AMMONIA in the last 168 hours. CBC:  Recent Labs Lab 11/17/14 1111 11/18/14 0537  WBC 7.0 7.8  NEUTROABS 5.3  --   HGB 12.5* 12.7*  HCT 38.8* 39.1  MCV 92.4 93.8  PLT 136* 135*   Cardiac Enzymes: No results for input(s): CKTOTAL, CKMB, CKMBINDEX, TROPONINI in the last 168 hours. BNP (last 3 results) No results for input(s): PROBNP in the last 8760 hours. CBG: No results for input(s): GLUCAP in the last 168 hours.  Recent Results (from the past 240 hour(s))  Urine culture     Status: None   Collection Time: 11/17/14  5:00 PM  Result Value Ref Range Status   Specimen Description URINE, CATHETERIZED  Final   Special Requests NONE  Final   Culture  Setup Time   Final    11/17/2014 23:04 Performed at [place] Performed at [facility]   Final   Culture NO GROWTH Performed at [facility]  Final   Report Status 11/18/2014 FINAL  Final  Culture, routine-abscess     Status: None (Preliminary result)   Collection Time: 11/18/14 10:52 AM  Result Value Ref Range Status   Specimen Description ABSCESS  Final   Special Requests PELVIC DIVERTICULAR ABSCESS  Final   Gram Stain PENDING  Incomplete   Culture   Final    NO GROWTH 1 DAY Performed at [facility]    Report Status PENDING  Incomplete  Anaerobic culture     Status: None (Preliminary result)   Collection Time: 11/18/14 10:52 AM  Result Value Ref Range Status   Specimen Description  ABSCESS  Final   Special Requests PELVIC DIVERTICULAR ABSCESS  Final   Gram Stain PENDING  Incomplete   Culture   Final    NO ANAEROBES ISOLATED; CULTURE IN PROGRESS FOR 5 DAYS Performed at [facility]    Report Status PENDING  Incomplete     Studies: Ct Image Guided Drainage By Percutaneous Catheter  11/18/2014   CLINICAL DATA:  Focal abscess adjacent to sigmoid colon and likely involving bladder wall with clinical evidence of colovesical fistula. The patient presents for percutaneous drainage of the perivesical/diverticular abscess.  EXAM: CT GUIDED DRAINAGE OF PERITONEAL ABSCESS  ANESTHESIA/SEDATION: 2.0 Mg IV Versed 100 mcg IV Fentanyl  Total Moderate Sedation Time:  15 minutes  PROCEDURE: The procedure, risks, benefits, and alternatives were explained to the patient. Questions regarding the procedure were encouraged and answered. The patient understands and consents to the procedure.  The left lower anterior abdominal wall was prepped with Betadine in a sterile fashion, and a sterile drape was applied covering the operative field. A sterile gown and sterile gloves were used for the procedure. Local anesthesia was provided with 1% Lidocaine. A time-out procedure was performed.  CT was performed in a supine position. Under CT fluoroscopic guidance, an 18 gauge trocar needle was advanced to the level of the perivesical abscess. After aspirating fluid, a guidewire was advanced into the collection. The tract was dilated and a 10 French percutaneous drain placed. Drainage catheter position was confirmed by CT.  The catheter was further aspirated and flushed with saline. A fluid sample was sent for culture analysis. The catheter was connected to a suction bulb. It was secured at the skin with a Prolene retention suture adhesive StatLock device.  COMPLICATIONS: None  FINDINGS: Abscess cavity located just anterior to the bladder dome and abutting the posterior margin of the mid sigmoid colon now  contains more air compared to the prior CT and an air-fluid level measuring approximately 3 cm transversely. The bladder has been decompressed by a Foley catheter and demonstrates wall thickening. The bladder also contains some air with findings again suggestive of a colovesical fistula.  Aspirated fluid at the level of abscess was grossly purulent and feculent. After placement of a drainage catheter, there is good return of fluid with suction bulb drainage.  IMPRESSION: CT-guided percutaneous catheter drainage of pelvic diverticular/perivesical abscess. Grossly purulent and feculent appearing fluid was aspirated and sent for culture. A 10 French drain was placed and attached to suction bulb drainage. Output will be followed.   Electronically Signed   By: Aletta Edouard M.D.   On: 11/18/2014 11:29    Scheduled Meds: . aspirin EC  81 mg Oral Daily  . donepezil  5 mg Oral QHS  . latanoprost  1 drop Both Eyes QHS  . levothyroxine  50 mcg Oral QAC breakfast  . pantoprazole  40 mg Oral Daily  . piperacillin-tazobactam (ZOSYN)  IV  2.25 g Intravenous 3 times per day  . saccharomyces boulardii  250 mg Oral Daily  . simvastatin  40 mg Oral q1800   Continuous Infusions: . sodium chloride 100 mL/hr at 11/19/14 1147    Principal Problem:   Abdominal abscess Active Problems:   UTI (urinary tract infection)   Hypotension   Diarrhea   Hyperlipidemia   Dysphagia   Stroke   BPH (benign prostatic hyperplasia)    Time spent: >35 minutes     Kinnie Feil  [facility] Pager [phone]. If 7PM-7AM, please contact night-coverage at [email or web address], password [facility] 11/19/2014, 1:00 PM  LOS: 2 days

## 2014-11-19 NOTE — Progress Notes (Signed)
ANTIBIOTIC CONSULT NOTE - FOLLOW UP  Pharmacy Consult for Zosyn Indication: intra abd abscess  No Known Allergies  Patient Measurements:    Vital Signs: Temp: 97.7 F (36.5 C) (11/22 1400) Temp Source: Oral (11/22 1400) BP: 127/47 mmHg (11/22 1400) Pulse Rate: 71 (11/22 1400) Intake/Output from previous day: 11/21 0701 - 11/22 0700 In: 2100 [P.O.:900; I.V.:1200] Out: 1950 [Urine:1900; Drains:50] Intake/Output from this shift: Total I/O In: 480 [P.O.:480] Out: 735 [Urine:725; Drains:10]  Labs:  Recent Labs  11/17/14 1111 11/18/14 0537 11/19/14 0410  WBC 7.0 7.8  --   HGB 12.5* 12.7*  --   PLT 136* 135*  --   CREATININE 3.62* 2.98* 2.61*   CrCl cannot be calculated (Unknown ideal weight.).    Microbiology: 11/20 zosyn>>  11/20 urine > neg 11/21 pelvic drain cx >   Assessment: 80 yo M continues on Zosyn d#3 for intra-abdominal abscess.  Noted plans for non-operative mgmt at this time assuming pt can tolerate oral diet.  Renal function continues to improve, CrCl now >20 ml/min.  Will adjust Zosyn dose accordingly.  Goal of Therapy:  Renal dose adjustment of antibiotics  Plan:  Change Zosyn 3.375 gm IV q8h (4 hour infusion). Follow up clinical progression and abx length of therapy.   [employer], Pharm.D., BCPS Clinical Pharmacist Pager [phone] 11/19/2014 3:48 PM

## 2014-11-19 NOTE — Progress Notes (Signed)
Subjective: No n/v/flatus/bm. Min abd pain.   Objective: Vital signs in last 24 hours: Temp:  [98 F (36.7 C)-98.9 F (37.2 C)] 98.5 F (36.9 C) (11/22 0420) Pulse Rate:  [58-82] 82 (11/22 0420) Resp:  [11-18] 18 (11/22 0420) BP: (100-145)/(44-96) 129/70 mmHg (11/22 0420) SpO2:  [91 %-96 %] 93 % (11/22 0420) Last BM Date: 11/18/14  Intake/Output from previous day: 11/21 0701 - 11/22 0700 In: 2100 [P.O.:900; I.V.:1200] Out: 1950 [Urine:1900; Drains:50] Intake/Output this shift: Total I/O In: -  Out: 10 [Drains:10]  Alert, nad, nontoxic No resp distress Soft, full, very mild TTP around drain sites; foley +SCD no edema   Lab Results:   Recent Labs  11/17/14 1111 11/18/14 0537  WBC 7.0 7.8  HGB 12.5* 12.7*  HCT 38.8* 39.1  PLT 136* 135*   BMET  Recent Labs  11/18/14 0537 11/19/14 0410  NA 143 145  K 4.7 4.4  CL 108 110  CO2 17* 18*  GLUCOSE 86 95  BUN 56* 40*  CREATININE 2.98* 2.61*  CALCIUM 8.2* 7.9*   PT/INR  Recent Labs  11/17/14 1621  LABPROT 14.3  INR 1.09   ABG No results for input(s): PHART, HCO3 in the last 72 hours.  Invalid input(s): PCO2, PO2  Studies/Results: Ct Abdomen Pelvis Wo Contrast  11/17/2014   CLINICAL DATA:  Subsequent encounter for abdominal pain and diarrhea. Pain in the right lower quadrant and difficulty urinating.  EXAM: CT ABDOMEN AND PELVIS WITHOUT CONTRAST  TECHNIQUE: Multidetector CT imaging of the abdomen and pelvis was performed following the standard protocol without IV contrast.  COMPARISON:  05/05/2005  FINDINGS: Lower chest: Compressive atelectasis noted in the dependent lung bases bilaterally.  Hepatobiliary: 9 mm low-density lesion in the dome of the left liver is probably a cyst. Liver with otherwise unremarkable on infused appearance. Multiple calcified gallstones measure up to 7 mm in diameter. No intrahepatic or extrahepatic biliary dilation.  Pancreas: No focal mass lesion. No dilatation of the main  duct. No intraparenchymal cyst. No peripancreatic edema.  Spleen: No splenomegaly. No focal mass lesion.  Adrenals/Urinary Tract: No adrenal nodule or mass. 2.5 cm water density lesion in the interpolar right kidney is likely a cyst. 2.4 cm water density lesion in the lower pole the left kidney is also likely a cyst. No evidence for stone disease in either kidney. No evidence for hydroureter or ureteral stone disease. Bladder is mildly distended and shows circumferential bladder wall thickening. Associated with the anterior bladder dome is marked wall thickening with end immediately contiguous 4.3 x 3.8 x 3.8 cm collection of debris and gas. This collection appears tether tube both in the dome of the bladder in the mid sigmoid colon. Surrounding edema/ inflammation is evident. There is a small amount a gas visible in the lumen of the urinary bladder.  Stomach/Bowel: Stomach is nondistended. No gastric wall thickening. No evidence of outlet obstruction. Duodenum is normally positioned as is the ligament of Treitz. No small bowel wall thickening. No small bowel dilatation. Terminal ileum and appendix are normal. Advanced diverticulosis is noted in the sigmoid colon. Please see adrenal urinary tract section above.  Vascular/Lymphatic: Atherosclerotic calcification is noted in the wall of the abdominal aorta without aneurysm. Right common iliac artery measures 2.6 cm in maximum diameter. Left common iliac artery measures up to 2.3 cm in diameter.  No gastrohepatic ligament or hepatoduodenal ligament lymphadenopathy. No retroperitoneal lymphadenopathy. No evidence for pelvic sidewall lymphadenopathy.  Reproductive: Prostate gland is enlarged.  Other: No  intraperitoneal free fluid. Bilateral inguinal hernias contain only fat.  Musculoskeletal: Mild degenerative changes are seen in the hips bilaterally. No worrisome lytic or sclerotic osseous abnormality.  IMPRESSION: 4.3 cm abscess involving the bladder wall at the dome  also appears tethered to the mid transverse colon which shows associated wall thickening. There is some surrounding edema/ inflammation and gas is visible in the urinary bladder. The presence of gas in the bladder is highly suggestive of colovesical fistula, likely secondary to previous diverticulitis.  Probable bilateral renal cysts.  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 Left common iliac artery measures 2.3 cm in diameter.  Insert cholelithiasis.   Electronically Signed   By: Misty Stanley M.D.   On: 11/17/2014 13:04   Ct Image Guided Drainage By Percutaneous Catheter  11/18/2014   CLINICAL DATA:  Focal abscess adjacent to sigmoid colon and likely involving bladder wall with clinical evidence of colovesical fistula. The patient presents for percutaneous drainage of the perivesical/diverticular abscess.  EXAM: CT GUIDED DRAINAGE OF PERITONEAL ABSCESS  ANESTHESIA/SEDATION: 2.0 Mg IV Versed 100 mcg IV Fentanyl  Total Moderate Sedation Time:  15 minutes  PROCEDURE: The procedure, risks, benefits, and alternatives were explained to the patient. Questions regarding the procedure were encouraged and answered. The patient understands and consents to the procedure.  The left lower anterior abdominal wall was prepped with Betadine in a sterile fashion, and a sterile drape was applied covering the operative field. A sterile gown and sterile gloves were used for the procedure. Local anesthesia was provided with 1% Lidocaine. A time-out procedure was performed.  CT was performed in a supine position. Under CT fluoroscopic guidance, an 18 gauge trocar needle was advanced to the level of the perivesical abscess. After aspirating fluid, a guidewire was advanced into the collection. The tract was dilated and a 10 French percutaneous drain placed. Drainage catheter position was confirmed by CT.  The catheter was further aspirated and flushed with saline. A fluid sample was sent for culture analysis. The catheter was  connected to a suction bulb. It was secured at the skin with a Prolene retention suture adhesive StatLock device.  COMPLICATIONS: None  FINDINGS: Abscess cavity located just anterior to the bladder dome and abutting the posterior margin of the mid sigmoid colon now contains more air compared to the prior CT and an air-fluid level measuring approximately 3 cm transversely. The bladder has been decompressed by a Foley catheter and demonstrates wall thickening. The bladder also contains some air with findings again suggestive of a colovesical fistula.  Aspirated fluid at the level of abscess was grossly purulent and feculent. After placement of a drainage catheter, there is good return of fluid with suction bulb drainage.  IMPRESSION: CT-guided percutaneous catheter drainage of pelvic diverticular/perivesical abscess. Grossly purulent and feculent appearing fluid was aspirated and sent for culture. A 10 French drain was placed and attached to suction bulb drainage. Output will be followed.   Electronically Signed   By: Aletta Edouard M.D.   On: 11/18/2014 11:29    Anti-infectives: Anti-infectives    Start     Dose/Rate Route Frequency Ordered Stop   11/17/14 1430  piperacillin-tazobactam (ZOSYN) IVPB 2.25 g     2.25 g100 mL/hr over 30 Minutes Intravenous 3 times per day 11/17/14 1351        Assessment/Plan: Sigmoid diverticulitis with abscess, possible colovesical fistula Acute kidney injury  Cont iv abx Urine cx neg F/u drain cx OOB, ambulate Cont foley for bladder drainage Stay on  liquids today Can start chemical VTE prophylaxis unless there is contraindication i'm not aware of  Discussed case with Dr Tresa Moore. We are going to aim for non-operative mgmt during this admission with further outpt workup (CT cystogram). However, there is a chance that he may fail non-op mgmt during this admission. Discussed with pt and daughter. Will gradually adv diet over the next several days and monitor for  worsening pain, fever, increasing wbc. If tolerates that, he will go home with perc drain and foley catheter.   Leighton Ruff. Redmond Pulling, MD, FACS General, Bariatric, & Minimally Invasive Surgery [facility], [place]    LOS: 2 days    Gayland Curry 11/19/2014

## 2014-11-19 NOTE — Progress Notes (Signed)
Utilization Review Completed.Robert Barnett T11/22/2015  

## 2014-11-19 NOTE — Progress Notes (Signed)
Referring Physician(s): CCS  Subjective: Patient admits to tenderness at drain site, denies any significant abdominal pain.   Allergies: Review of patient's allergies indicates no known allergies.  Medications: Prior to Admission medications   Medication Sig Start Date End Date Taking? Authorizing Provider  amLODipine-benazepril (LOTREL) 10-20 MG per capsule Take 1 capsule by mouth daily.   Yes Historical Provider, MD  aspirin 81 MG tablet Take 81 mg by mouth daily.   Yes Historical Provider, MD  ciprofloxacin (CIPRO) 250 MG tablet Take 250 mg by mouth 2 (two) times daily.   Yes Historical Provider, MD  clopidogrel (PLAVIX) 75 MG tablet Take 75 mg by mouth daily.   Yes Historical Provider, MD  donepezil (ARICEPT) 5 MG tablet Take 5 mg by mouth at bedtime.   Yes Historical Provider, MD  levothyroxine (SYNTHROID, LEVOTHROID) 50 MCG tablet Take 50 mcg by mouth daily before breakfast.   Yes Historical Provider, MD  LUMIGAN 0.01 % SOLN Place 1 drop into both eyes at bedtime. 10/12/14  Yes Historical Provider, MD  pantoprazole (PROTONIX) 40 MG tablet Take 40 mg by mouth daily.   Yes Historical Provider, MD  Probiotic Product (ALIGN) 4 MG CAPS Take 1 capsule by mouth daily.   Yes Historical Provider, MD  simvastatin (ZOCOR) 40 MG tablet Take 40 mg by mouth daily.   Yes Historical Provider, MD    Review of Systems  Vital Signs: BP 129/70 mmHg  Pulse 82  Temp(Src) 98.5 F (36.9 C) (Oral)  Resp 18  SpO2 93%  Physical Exam General: Alert, NAD, sitting up in chair Abd: Soft, ND, (+) BS, suprapubic perc drain intact output purulent bloody, 24 hr 50 cc, Cx pending.  Imaging: Ct Abdomen Pelvis Wo Contrast  11/17/2014   CLINICAL DATA:  Subsequent encounter for abdominal pain and diarrhea. Pain in the right lower quadrant and difficulty urinating.  EXAM: CT ABDOMEN AND PELVIS WITHOUT CONTRAST  TECHNIQUE: Multidetector CT imaging of the abdomen and pelvis was performed following the  standard protocol without IV contrast.  COMPARISON:  05/05/2005  FINDINGS: Lower chest: Compressive atelectasis noted in the dependent lung bases bilaterally.  Hepatobiliary: 9 mm low-density lesion in the dome of the left liver is probably a cyst. Liver with otherwise unremarkable on infused appearance. Multiple calcified gallstones measure up to 7 mm in diameter. No intrahepatic or extrahepatic biliary dilation.  Pancreas: No focal mass lesion. No dilatation of the main duct. No intraparenchymal cyst. No peripancreatic edema.  Spleen: No splenomegaly. No focal mass lesion.  Adrenals/Urinary Tract: No adrenal nodule or mass. 2.5 cm water density lesion in the interpolar right kidney is likely a cyst. 2.4 cm water density lesion in the lower pole the left kidney is also likely a cyst. No evidence for stone disease in either kidney. No evidence for hydroureter or ureteral stone disease. Bladder is mildly distended and shows circumferential bladder wall thickening. Associated with the anterior bladder dome is marked wall thickening with end immediately contiguous 4.3 x 3.8 x 3.8 cm collection of debris and gas. This collection appears tether tube both in the dome of the bladder in the mid sigmoid colon. Surrounding edema/ inflammation is evident. There is a small amount a gas visible in the lumen of the urinary bladder.  Stomach/Bowel: Stomach is nondistended. No gastric wall thickening. No evidence of outlet obstruction. Duodenum is normally positioned as is the ligament of Treitz. No small bowel wall thickening. No small bowel dilatation. Terminal ileum and appendix are normal. Advanced diverticulosis  is noted in the sigmoid colon. Please see adrenal urinary tract section above.  Vascular/Lymphatic: Atherosclerotic calcification is noted in the wall of the abdominal aorta without aneurysm. Right common iliac artery measures 2.6 cm in maximum diameter. Left common iliac artery measures up to 2.3 cm in diameter.  No  gastrohepatic ligament or hepatoduodenal ligament lymphadenopathy. No retroperitoneal lymphadenopathy. No evidence for pelvic sidewall lymphadenopathy.  Reproductive: Prostate gland is enlarged.  Other: No intraperitoneal free fluid. Bilateral inguinal hernias contain only fat.  Musculoskeletal: Mild degenerative changes are seen in the hips bilaterally. No worrisome lytic or sclerotic osseous abnormality.  IMPRESSION: 4.3 cm abscess involving the bladder wall at the dome also appears tethered to the mid transverse colon which shows associated wall thickening. There is some surrounding edema/ inflammation and gas is visible in the urinary bladder. The presence of gas in the bladder is highly suggestive of colovesical fistula, likely secondary to previous diverticulitis.  Probable bilateral renal cysts.  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 Left common iliac artery measures 2.3 cm in diameter.  Insert cholelithiasis.   Electronically Signed   By: Misty Stanley M.D.   On: 11/17/2014 13:04   Ct Image Guided Drainage By Percutaneous Catheter  11/18/2014   CLINICAL DATA:  Focal abscess adjacent to sigmoid colon and likely involving bladder wall with clinical evidence of colovesical fistula. The patient presents for percutaneous drainage of the perivesical/diverticular abscess.  EXAM: CT GUIDED DRAINAGE OF PERITONEAL ABSCESS  ANESTHESIA/SEDATION: 2.0 Mg IV Versed 100 mcg IV Fentanyl  Total Moderate Sedation Time:  15 minutes  PROCEDURE: The procedure, risks, benefits, and alternatives were explained to the patient. Questions regarding the procedure were encouraged and answered. The patient understands and consents to the procedure.  The left lower anterior abdominal wall was prepped with Betadine in a sterile fashion, and a sterile drape was applied covering the operative field. A sterile gown and sterile gloves were used for the procedure. Local anesthesia was provided with 1% Lidocaine. A time-out procedure was  performed.  CT was performed in a supine position. Under CT fluoroscopic guidance, an 18 gauge trocar needle was advanced to the level of the perivesical abscess. After aspirating fluid, a guidewire was advanced into the collection. The tract was dilated and a 10 French percutaneous drain placed. Drainage catheter position was confirmed by CT.  The catheter was further aspirated and flushed with saline. A fluid sample was sent for culture analysis. The catheter was connected to a suction bulb. It was secured at the skin with a Prolene retention suture adhesive StatLock device.  COMPLICATIONS: None  FINDINGS: Abscess cavity located just anterior to the bladder dome and abutting the posterior margin of the mid sigmoid colon now contains more air compared to the prior CT and an air-fluid level measuring approximately 3 cm transversely. The bladder has been decompressed by a Foley catheter and demonstrates wall thickening. The bladder also contains some air with findings again suggestive of a colovesical fistula.  Aspirated fluid at the level of abscess was grossly purulent and feculent. After placement of a drainage catheter, there is good return of fluid with suction bulb drainage.  IMPRESSION: CT-guided percutaneous catheter drainage of pelvic diverticular/perivesical abscess. Grossly purulent and feculent appearing fluid was aspirated and sent for culture. A 10 French drain was placed and attached to suction bulb drainage. Output will be followed.   Electronically Signed   By: Aletta Edouard M.D.   On: 11/18/2014 11:29    Labs:  CBC:  Recent Labs  11/17/14 1111 11/18/14 0537  WBC 7.0 7.8  HGB 12.5* 12.7*  HCT 38.8* 39.1  PLT 136* 135*    COAGS:  Recent Labs  11/17/14 1621  INR 1.09  APTT 29    BMP:  Recent Labs  11/17/14 1111 11/18/14 0537 11/19/14 0410  NA 135* 143 145  K 4.3 4.7 4.4  CL 102 108 110  CO2 18* 17* 18*  GLUCOSE 116* 86 95  BUN 67* 56* 40*  CALCIUM 8.2* 8.2* 7.9*   CREATININE 3.62* 2.98* 2.61*  GFRNONAA 15* 18* 22*  GFRAA 17* 21* 25*    LIVER FUNCTION TESTS:  Recent Labs  11/17/14 1111 11/19/14 0410  BILITOT 0.4 0.3  AST 58* 36  ALT 27 20  ALKPHOS 64 60  PROT 6.8 6.3  ALBUMIN 2.2* 2.1*   Assessment and Plan: Diverticular/perivesical abscess S/p perc drain 11/21, likely involving bladder wall with clinical evidence of colovesical fistula. Continue to monitor daily output, Cx pending. Plans per CCS    I spent a total of 15 minutes face to face in clinical consultation/evaluation, greater than 50% of which was counseling/coordinating care   Signed: Hedy Jacob 11/19/2014, 9:16 AM

## 2014-11-20 LAB — CBC
HCT: 37.4 % — ABNORMAL LOW (ref 39.0–52.0)
Hemoglobin: 12.2 g/dL — ABNORMAL LOW (ref 13.0–17.0)
MCH: 29.8 pg (ref 26.0–34.0)
MCHC: 32.6 g/dL (ref 30.0–36.0)
MCV: 91.2 fL (ref 78.0–100.0)
Platelets: 140 10*3/uL — ABNORMAL LOW (ref 150–400)
RBC: 4.1 MIL/uL — AB (ref 4.22–5.81)
RDW: 13.5 % (ref 11.5–15.5)
WBC: 8.3 10*3/uL (ref 4.0–10.5)

## 2014-11-20 LAB — BASIC METABOLIC PANEL
Anion gap: 12 (ref 5–15)
BUN: 26 mg/dL — ABNORMAL HIGH (ref 6–23)
CHLORIDE: 111 meq/L (ref 96–112)
CO2: 19 mEq/L (ref 19–32)
Calcium: 7.9 mg/dL — ABNORMAL LOW (ref 8.4–10.5)
Creatinine, Ser: 2.13 mg/dL — ABNORMAL HIGH (ref 0.50–1.35)
GFR calc Af Amer: 32 mL/min — ABNORMAL LOW (ref >90)
GFR calc non Af Amer: 28 mL/min — ABNORMAL LOW (ref >90)
GLUCOSE: 90 mg/dL (ref 70–99)
POTASSIUM: 4.2 meq/L (ref 3.7–5.3)
SODIUM: 142 meq/L (ref 137–147)

## 2014-11-20 NOTE — Care Management Note (Signed)
    Page 1 of 2   11/22/2014     1:04:34 PM CARE MANAGEMENT NOTE 11/22/2014  Patient:  Robert Barnett, Robert Barnett   Account Number:  [id number]  Date Initiated:  11/20/2014  Documentation initiated by:  Marvetta Gibbons  Subjective/Objective Assessment:   Pt admitted with abdominal abcess     Action/Plan:   PTA pt lived at home alone- PT/OT evals   Anticipated DC Date:  11/22/2014   Anticipated DC Plan:  [place]  In-house referral  Clinical Social Worker      DC Forensic scientist  CM consult      [facility] Choice  HOME HEALTH   Choice offered to / List presented to:  C-4 Adult Children        [place] arranged  HH-1 RN  [place]      [place].   Status of service:  Completed, signed off Medicare Important Message given?  YES (If response is "NO", the following Medicare IM given date fields will be blank) Date Medicare IM given:  11/20/2014 Medicare IM given by:  Marvetta Gibbons Date Additional Medicare IM given:   Additional Medicare IM given by:    Discharge Disposition:  [place]  Per UR Regulation:  Reviewed for med. necessity/level of care/duration of stay  If discussed at [place] of Stay Meetings, dates discussed:    Comments:  11/22/14- 4- Marvetta Gibbons RN, BSN [phone] Pt for d/c home today- have spoken with Isaias Sakai- orders placed for HH-RN/PT/OT/aide/ST/CSW- referral has been called to Amy with [facility]- requested [facility] to f/u with family on possible leg bag for Foley per daughter's request- bedside RN to do family education on cath care and drain flushes. Address confirmed- correct phone # is [phone] for pts home  11/21/14- [address] Therapist, sports, BSN [phone] Spoke with pt and daughter Isaias Sakai- plan now is to return home with [facility] services- per daughter- family is going to arrange time off over next 2 weeks in order to have someone with pt 24/7-  per daughter they have chosen [facility] for [facility] services- and CM will ask MD for orders- no DME needed- will await orders for referral.  11/20/14- 1130- Marvetta Gibbons RN, BSN [phone] spoke with family at bedside regarding d/c options SNF vs HH vs home with [facility], family all work during day and would not be able to provide 24/7 supervision. would like to look into STSNF for rehab at discharge- CSW consulted and to speak with family. Provide family with private duty list. They have already looked into [facility] as a potential option when pt returns home - would just need to have assessment done by [facility] to continue process if they want to contiue to look into that option. At this time- d/c plan is for STSNF
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
## 2014-11-20 NOTE — Progress Notes (Signed)
Patient ID: Robert Barnett, male   DOB: April 13, 1932, 80 y.o.   MRN: [id number]  [facility] PROGRESS NOTE  Kaleb Boedeker [facility] [id number] DOB: 10/14/1932 DOA: 11/17/2014 PCP: Delphina Cahill, MD  Brief narrative: 80 y/o male with PMH of HTN, BPH, prior h/o CVA, dementia presented with diarrhea, abd pain found to Sigmoid diverticulitis with abscess, possible colovesical fistula   Assessment and Plan:   1. 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 Tresa Moore 2. UTI (urinary tract infection), pend cultures, continue IV Zosyn 3. Hypotension Likely due to #1, BP more stable this AM, resume amlodipine in AM, would avoid benazepril due to renal insufficiency  4. Diarrhea Likely due to #1, resolved and unable to obtain C. Diff, d/c contact precautions  5. H/o Stroke, on ASA and statin, plavix was on hold for procedure; will resume if no surgical intervention planned  6. BPH (benign prostatic hyperplasia); on foley catheter 7. AKI likely prerenal; volume loss diarrhea; Cr trending down with IVF, continue to hold Benazepril, repeat BMP in AM  Code Status: full Family Communication: d/w patient Disposition Plan: pend clinical improvement, ? SNF per family request   Consultants:  General surgery  Interventional radiology  Procedures:  Percutaneous drain placement space 11/21  Antibiotics:  Zosyn 11/20  Faye Ramsay, MD  [facility] Pager [phone]  If 7PM-7AM, please contact night-coverage [email or web address] Password [facility] 11/20/2014, 1:36 PM   LOS: 3 days   HPI/Subjective: No events overnight.    Objective: Filed Vitals:   11/19/14 0420 11/19/14 1400 11/19/14 2222 11/20/14 0426  BP: 129/70 127/47 138/77 138/64  Pulse: 82 71 71 62  Temp: 98.5 F (36.9 C) 97.7 F (36.5 C) 99 F (37.2 C) 98.7 F (37.1 C)  TempSrc: Oral Oral Oral Oral  Resp: 18 16 16 16   SpO2: 93% 100% 92% 93%    Intake/Output Summary (Last 24 hours) at 11/20/14 1336 Last data filed at 11/20/14 0607  Gross per 24 hour  Intake   1010 ml  Output   1370 ml  Net   -360 ml    Exam:   General:  Pt is alert, follows commands appropriately, not in acute distress  Cardiovascular: Regular rate and rhythm, no rubs, no gallops  Respiratory: Clear to auscultation bilaterally, no wheezing, no crackles, no rhonchi  Abdomen: Soft, mild tenderness in LLQ, drain with minimal cloudy bloody output, +BS, ND  Extremities: pulses DP and PT palpable bilaterally  Neuro: Grossly nonfocal  Data Reviewed: Basic Metabolic Panel:  Recent Labs Lab 11/17/14 1111 11/18/14 0537 11/19/14 0410 11/20/14 0422  NA 135* 143 145 142  K 4.3 4.7 4.4 4.2  CL 102 108 110 111  CO2 18* 17* 18* 19  GLUCOSE 116* 86 95 90  BUN 67* 56* 40* 26*  CREATININE 3.62* 2.98* 2.61* 2.13*  CALCIUM 8.2* 8.2* 7.9* 7.9*   Liver Function Tests:  Recent Labs Lab 11/17/14 1111 11/19/14 0410  AST 58* 36  ALT 27 20  ALKPHOS 64 60  BILITOT 0.4 0.3  PROT 6.8 6.3  ALBUMIN 2.2* 2.1*    Recent Labs Lab 11/17/14 1111  LIPASE 119*   CBC:  Recent Labs Lab 11/17/14 1111 11/18/14 0537 11/20/14 0422  WBC 7.0 7.8 8.3  NEUTROABS 5.3  --   --   HGB 12.5* 12.7* 12.2*  HCT 38.8* 39.1 37.4*  MCV 92.4 93.8 91.2  PLT 136* 135* 140*    Recent Results (from the past 240 hour(s))  Urine culture     Status: None   Collection Time: 11/17/14  5:00 PM  Result Value Ref Range Status   Specimen Description URINE, CATHETERIZED  Final   Special Requests NONE  Final   Culture  Setup Time   Final    11/17/2014 23:04 Performed at [place] Performed at [facility]   Final   Culture NO GROWTH Performed at [facility]   Final   Report Status 11/18/2014 FINAL  Final  Culture, routine-abscess     Status: None (Preliminary result)   Collection Time: 11/18/14 10:52 AM  Result Value Ref Range Status   Specimen Description ABSCESS  Final   Special Requests PELVIC DIVERTICULAR ABSCESS  Final   Gram Stain PENDING  Incomplete   Culture   Final    RARE GRAM NEGATIVE RODS Performed at [facility]    Report Status PENDING  Incomplete  Anaerobic culture     Status: None (Preliminary result)   Collection Time: 11/18/14 10:52 AM  Result Value Ref Range Status   Specimen Description ABSCESS  Final   Special Requests PELVIC DIVERTICULAR ABSCESS  Final   Gram Stain PENDING  Incomplete   Culture   Final    NO ANAEROBES ISOLATED; CULTURE IN PROGRESS FOR 5 DAYS Performed at [facility]    Report Status PENDING  Incomplete     Scheduled Meds: . aspirin EC  81 mg Oral Daily  . donepezil  5 mg Oral QHS  . latanoprost  1 drop Both Eyes QHS  . levothyroxine  50 mcg Oral QAC breakfast  . pantoprazole  40 mg Oral Daily  . piperacillin-tazobactam (ZOSYN)  IV  3.375 g Intravenous 3 times per day  . saccharomyces boulardii  250 mg Oral Daily  . simvastatin  40 mg Oral q1800   Continuous Infusions: . sodium chloride 100 mL/hr at 11/20/14 1302

## 2014-11-20 NOTE — Clinical Social Work Placement (Signed)
Clinical Social Work Department CLINICAL SOCIAL WORK PLACEMENT NOTE 11/20/2014  Patient:  Robert Barnett, Robert Barnett  Account Number:  [id number] Admit date:  11/17/2014  Clinical Social Worker:  Daemien Fronczak, LCSWA  Date/time:  11/20/2014 05:49 PM  Clinical Social Work is seeking post-discharge placement for this patient at the following level of care:   SKILLED NURSING   (*CSW will update this form in Epic as items are completed)   11/20/2014  Patient/family provided with [place] Department of Clinical Social Work's list of facilities offering this level of care within the geographic area requested by the patient (or if unable, by the patient's family).  11/20/2014  Patient/family informed of their freedom to choose among providers that offer the needed level of care, that participate in Medicare, Medicaid or managed care program needed by the patient, have an available bed and are willing to accept the patient.  11/20/2014  Patient/family informed of [facility]' ownership interest in [facility], as well as of the fact that they are under no obligation to receive care at this facility.  PASARR submitted to EDS on 11/20/2014 PASARR number received on 11/20/2014  FL2 transmitted to all facilities in geographic area requested by pt/family on  11/20/2014 FL2 transmitted to all facilities within larger geographic area on   Patient informed that his/her managed care company has contracts with or will negotiate with  certain facilities, including the following:     Patient/family informed of bed offers received:   Patient chooses bed at  Physician recommends and patient chooses bed at    Patient to be transferred to  on   Patient to be transferred to facility by  Patient and family notified of transfer on  Name of family member notified:    The following physician request were entered in Epic:   Additional Comments:   Mishawn Hemann R. [place], MSW,  [place] 11/20/2014 5:50 PM

## 2014-11-20 NOTE — Progress Notes (Signed)
Medicare Important Message given? YES  (If response is "NO", the following Medicare IM given date fields will be blank)  Date Medicare IM given:  Medicare IM given by:  Alura Olveda Medicare Important Message given? YES  (If response is "NO", the following Medicare IM given date fields will be blank)  Date Medicare IM given: 11/20/14 Medicare IM given by:  Dahlia Client [employer]

## 2014-11-20 NOTE — Progress Notes (Signed)
  Subjective:  1 - Perivesical Abscess / Likely Colo-Vesical - Abscess Fistula - about 1 mo vague abdominal pain and malaise progressive in nature prompting [place] eval and CT which demonstrates bladder dome - colon likely abscess collection with some small gas in bladder. No h/o gross hematuria. Or GU malignancy. Denies fecaluria or pnumaturia but family does describe worsened irritative voiding symptoms x several weeks.  Presently being managed non-operatively with foley + perc drain. UCX negative. Abscess CX pending.   2 - Acute Renal Failure - Cr 3.6 on admission. CT w/o hydro. Cr 2008 <1.5. Family does admit to very poor PO intake, albumin also very low. Improving with hydration.   Today Robert Barnett is seen in f/u above. Had perc drain over weekend. Abd pain much improved, tollerating PO intake and having resuemd bowel function.   Objective: Vital signs in last 24 hours: Temp:  [97.9 F (36.6 C)-99 F (37.2 C)] 97.9 F (36.6 C) (11/23 1400) Pulse Rate:  [62-71] 64 (11/23 1400) Resp:  [16-20] 20 (11/23 1400) BP: (109-138)/(64-81) 109/81 mmHg (11/23 1400) SpO2:  [92 %-94 %] 94 % (11/23 1400) Last BM Date: 11/18/14  Intake/Output from previous day: 11/22 0701 - 11/23 0700 In: 1500 [P.O.:480; I.V.:1000] Out: 2105 [Urine:2075; Drains:30] Intake/Output this shift: Total I/O In: 290 [P.O.:290] Out: 550 [Urine:550]  General appearance: alert, cooperative, appears stated age and AO x 2.  Head: Normocephalic, without obvious abnormality, atraumatic Back: symmetric, no curvature. ROM normal. No CVA tenderness. Resp: non-labored on room air Cardio: Nl rate GI: soft, non-tender; bowel sounds normal; no masses,  no organomegaly and perc abd drain in place wtih scant brownish fluid.  Male genitalia: normal, foley c/d/i with cloudy urine, non-feculant.  Extremities: extremities normal, atraumatic, no cyanosis or edema Pulses: 2+ and symmetric Skin: Skin color, texture, turgor normal. No  rashes or lesions Lymph nodes: Cervical, supraclavicular, and axillary nodes normal.  Lab Results:   Recent Labs  11/18/14 0537 11/20/14 0422  WBC 7.8 8.3  HGB 12.7* 12.2*  HCT 39.1 37.4*  PLT 135* 140*   BMET  Recent Labs  11/19/14 0410 11/20/14 0422  NA 145 142  K 4.4 4.2  CL 110 111  CO2 18* 19  GLUCOSE 95 90  BUN 40* 26*  CREATININE 2.61* 2.13*  CALCIUM 7.9* 7.9*   PT/INR No results for input(s): LABPROT, INR in the last 72 hours. ABG No results for input(s): PHART, HCO3 in the last 72 hours.  Invalid input(s): PCO2, PO2  Studies/Results: No results found.  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 I will try to arrange for office cystogram in about 2 weeks with foley removal if no gross leaks.   2 - Acute Renal Failure - likely pre-renal on intrinsic renal, improving with hydration.  3 - Call with questions.  [facility], Qamar Aughenbaugh 11/20/2014

## 2014-11-20 NOTE — Clinical Social Work Psychosocial (Signed)
Clinical Social Work Department BRIEF PSYCHOSOCIAL ASSESSMENT 11/20/2014  Patient:  Robert Barnett, Robert Barnett     Account Number:  [id number]     Admit date:  11/17/2014  Clinical Social Worker:  Dian Queen  Date/Time:  11/20/2014 05:37 PM  Referred by:  Physician  Date Referred:  11/20/2014 Referred for  SNF Placement   Other Referral:   Interview type:  Family Other interview type:    PSYCHOSOCIAL DATA Living Status:  ALONE Admitted from facility:   Level of care:   Primary support name:  family Primary support relationship to patient:   Degree of support available:    CURRENT CONCERNS Current Concerns  Post-Acute Placement   Other Concerns:    SOCIAL WORK ASSESSMENT / PLAN Patient is a 80 year old male who has beginning dementia. Family are very supportive of patient's care.  Patient's family are at bedside, and main source of information. Patient's family all work during the day time, and they need somewhere for him to go for rehab to get his strength back.  Patient did not have any input, due to his dementia. Family would like to have patient go to SNF for rehab in [facility], and then possibly look into moving to a memory care unit.  Patient's family supportive of plan to discharge once he is medically ready and discharge orders have been received.   Assessment/plan status:   Other assessment/ plan:   Information/referral to community resources:    PATIENT'S/FAMILY'S RESPONSE TO PLAN OF CARE: Patient's family in agreement to plan of care.   Jones Broom. [place], MSW, [place] 11/20/2014 5:48 PM

## 2014-11-20 NOTE — Plan of Care (Signed)
Problem: Phase I Progression Outcomes Goal: Pain controlled with appropriate interventions Outcome: Completed/Met Date Met:  11/20/14     

## 2014-11-20 NOTE — Progress Notes (Signed)
Patient ID: Robert Barnett, male   DOB: 26-Jun-1932, 80 y.o.   MRN: [id number]    Subjective: Pt feels ok.  Unrealistic expectations for how well he can take care of himself at home.  Pain improving.  Tolerating clear liquids.  + flatus.  Objective: Vital signs in last 24 hours: Temp:  [97.7 F (36.5 C)-99 F (37.2 C)] 98.7 F (37.1 C) (11/23 0426) Pulse Rate:  [62-71] 62 (11/23 0426) Resp:  [16] 16 (11/23 0426) BP: (127-138)/(47-77) 138/64 mmHg (11/23 0426) SpO2:  [92 %-100 %] 93 % (11/23 0426) Last BM Date: 11/18/14  Intake/Output from previous day: 11/22 0701 - 11/23 0700 In: 1500 [P.O.:480; I.V.:1000] Out: 2105 [Urine:2075; Drains:30] Intake/Output this shift:    PE: Abd: soft, mild tenderness in LLQ, drain with minimal cloudy bloody output, +BS, ND  Lab Results:   Recent Labs  11/18/14 0537 11/20/14 0422  WBC 7.8 8.3  HGB 12.7* 12.2*  HCT 39.1 37.4*  PLT 135* 140*   BMET  Recent Labs  11/19/14 0410 11/20/14 0422  NA 145 142  K 4.4 4.2  CL 110 111  CO2 18* 19  GLUCOSE 95 90  BUN 40* 26*  CREATININE 2.61* 2.13*  CALCIUM 7.9* 7.9*   PT/INR  Recent Labs  11/17/14 1621  LABPROT 14.3  INR 1.09   CMP     Component Value Date/Time   NA 142 11/20/2014 0422   K 4.2 11/20/2014 0422   CL 111 11/20/2014 0422   CO2 19 11/20/2014 0422   GLUCOSE 90 11/20/2014 0422   BUN 26* 11/20/2014 0422   CREATININE 2.13* 11/20/2014 0422   CALCIUM 7.9* 11/20/2014 0422   PROT 6.3 11/19/2014 0410   ALBUMIN 2.1* 11/19/2014 0410   AST 36 11/19/2014 0410   ALT 20 11/19/2014 0410   ALKPHOS 60 11/19/2014 0410   BILITOT 0.3 11/19/2014 0410   GFRNONAA 28* 11/20/2014 0422   GFRAA 32* 11/20/2014 0422   Lipase     Component Value Date/Time   LIPASE 119* 11/17/2014 1111       Studies/Results: Ct Image Guided Drainage By Percutaneous Catheter  11/18/2014   CLINICAL DATA:  Focal abscess adjacent to sigmoid colon and likely involving bladder wall with clinical evidence  of colovesical fistula. The patient presents for percutaneous drainage of the perivesical/diverticular abscess.  EXAM: CT GUIDED DRAINAGE OF PERITONEAL ABSCESS  ANESTHESIA/SEDATION: 2.0 Mg IV Versed 100 mcg IV Fentanyl  Total Moderate Sedation Time:  15 minutes  PROCEDURE: The procedure, risks, benefits, and alternatives were explained to the patient. Questions regarding the procedure were encouraged and answered. The patient understands and consents to the procedure.  The left lower anterior abdominal wall was prepped with Betadine in a sterile fashion, and a sterile drape was applied covering the operative field. A sterile gown and sterile gloves were used for the procedure. Local anesthesia was provided with 1% Lidocaine. A time-out procedure was performed.  CT was performed in a supine position. Under CT fluoroscopic guidance, an 18 gauge trocar needle was advanced to the level of the perivesical abscess. After aspirating fluid, a guidewire was advanced into the collection. The tract was dilated and a 10 French percutaneous drain placed. Drainage catheter position was confirmed by CT.  The catheter was further aspirated and flushed with saline. A fluid sample was sent for culture analysis. The catheter was connected to a suction bulb. It was secured at the skin with a Prolene retention suture adhesive StatLock device.  COMPLICATIONS: None  FINDINGS:  Abscess cavity located just anterior to the bladder dome and abutting the posterior margin of the mid sigmoid colon now contains more air compared to the prior CT and an air-fluid level measuring approximately 3 cm transversely. The bladder has been decompressed by a Foley catheter and demonstrates wall thickening. The bladder also contains some air with findings again suggestive of a colovesical fistula.  Aspirated fluid at the level of abscess was grossly purulent and feculent. After placement of a drainage catheter, there is good return of fluid with suction bulb  drainage.  IMPRESSION: CT-guided percutaneous catheter drainage of pelvic diverticular/perivesical abscess. Grossly purulent and feculent appearing fluid was aspirated and sent for culture. A 10 French drain was placed and attached to suction bulb drainage. Output will be followed.   Electronically Signed   By: Aletta Edouard M.D.   On: 11/18/2014 11:29    Anti-infectives: Anti-infectives    Start     Dose/Rate Route Frequency Ordered Stop   11/19/14 2200  piperacillin-tazobactam (ZOSYN) IVPB 3.375 g     3.375 g12.5 mL/hr over 240 Minutes Intravenous 3 times per day 11/19/14 1549     11/17/14 1430  piperacillin-tazobactam (ZOSYN) IVPB 2.25 g  Status:  Discontinued     2.25 g100 mL/hr over 30 Minutes Intravenous 3 times per day 11/17/14 1351 11/19/14 1549       Assessment/Plan  1. Diverticulitis with abscess, s/p perc drain 2. ? Colovesical fistula 3. Acute kidney injury  Plan: 1. Cont IV abx therapy, can likely start to switch to oral tomorrow 2. Rare gram negative rods on abscess culture 3. Cont foley per urology and plan for outpatient CT with cysto by Dr. Tresa Moore 4. Advance to full liquids today  LOS: 3 days    Gurshaan Matsuoka E 11/20/2014, 10:27 AM Pager: [phone]

## 2014-11-20 NOTE — Evaluation (Signed)
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 He demonstrates the below listed deficits and will benefit from continued OT to maximize safety and independence with BADLs.  Pt presents to OT with mild balance deficits, generalized weakness, and impaired cognition.  Currently, he requires min A for BADLs.  He will need 24 hour supervision at discharge.  Recommend SNF.       Follow Up Recommendations  SNF    Equipment Recommendations  None recommended by OT    Recommendations for Other Services       Precautions / Restrictions Precautions Precautions: Fall Precaution Comments: bulb drain Restrictions Weight Bearing Restrictions: No      Mobility Bed Mobility Overal bed mobility: Needs Assistance Bed Mobility: Supine to Sit;Sit to Supine     Supine to sit: Supervision Sit to supine: Supervision      Transfers Overall transfer level: Needs assistance Equipment used: None Transfers: Sit to/from [employer] Transfers Sit to Stand: Min guard Stand pivot transfers: Min guard       General transfer comment: min guard for safety     Balance Overall balance assessment: Needs assistance Sitting-balance support: Feet supported Sitting balance-Leahy Scale: Good     Standing balance support: During functional activity Standing balance-Leahy Scale: Good                              ADL Overall ADL's : Needs assistance/impaired Eating/Feeding: Set up;Sitting   Grooming: Wash/dry hands;Wash/dry face;Oral care;Min guard;Standing   Upper Body Bathing: Supervision/ safety;Sitting   Lower Body Bathing: Sit to/from stand;Minimal  assistance   Upper Body Dressing : Supervision/safety;Sitting   Lower Body Dressing: Minimal assistance;Sit to/from stand   Toilet Transfer: Min guard;Ambulation;Comfort height toilet   Toileting- Clothing Manipulation and Hygiene: Min guard;Sit to/from stand       Functional mobility during ADLs: Min guard General ADL Comments: Pt very pleasant, but confused,  Needs redirection and assist for thoroughness with ADLs     Vision                     Perception     Praxis      Pertinent Vitals/Pain Pain Assessment: No/denies pain     Hand Dominance     Extremity/Trunk Assessment Upper Extremity Assessment Upper Extremity Assessment: Overall WFL for tasks assessed   Lower Extremity Assessment Lower Extremity Assessment: Defer to PT evaluation   Cervical / Trunk Assessment Cervical / Trunk Assessment: Normal   Communication Communication Communication: HOH   Cognition Arousal/Alertness: Awake/alert Behavior During Therapy: WFL for tasks assessed/performed Overall Cognitive Status: Impaired/Different from baseline Area of Impairment: Orientation;Attention;Safety/judgement;Problem solving Orientation Level: Disoriented to;Time;Situation Current Attention Level: Sustained Memory: Decreased short-term memory   Safety/Judgement: Decreased awareness of safety;Decreased awareness of deficits   Problem Solving: Difficulty sequencing;Requires verbal cues     General Comments       Exercises       Shoulder Instructions      Home Living Family/patient expects to be discharged to:: Skilled nursing facility Living Arrangements: Alone Available Help at Discharge: Family;Available PRN/intermittently Type of Home: House       Home Layout: One level  Home Equipment: None          Prior Functioning/Environment Level of Independence: Independent             OT Diagnosis: Generalized weakness;Cognitive deficits   OT Problem  List: Decreased strength;Decreased activity tolerance;Impaired balance (sitting and/or standing);Decreased cognition;Decreased safety awareness   OT Treatment/Interventions: Self-care/ADL training;DME and/or AE instruction;Therapeutic activities;Cognitive remediation/compensation;Patient/family education;Balance training    OT Goals(Current goals can be found in the care plan section) Acute Rehab OT Goals Patient Stated Goal: to go home  OT Goal Formulation: With patient Time For Goal Achievement: 12/04/14 Potential to Achieve Goals: Good ADL Goals Pt Will Perform Grooming: with supervision;standing Pt Will Perform Lower Body Bathing: with supervision;sit to/from stand Pt Will Perform Lower Body Dressing: with supervision;sit to/from stand Pt Will Transfer to Toilet: with supervision;ambulating;regular height toilet;bedside commode;grab bars Pt Will Perform Toileting - Clothing Manipulation and hygiene: with supervision;sit to/from stand  OT Frequency: Min 2X/week   Barriers to D/C: Decreased caregiver support          Co-evaluation              End of Session    Activity Tolerance: Patient tolerated treatment well Patient left: in bed;with call bell/phone within reach;with family/visitor present   Time: [id number] OT Time Calculation (min): 28 min Charges:  OT General Charges $OT Visit: 1 Procedure OT Evaluation $Initial OT Evaluation Tier I: 1 Procedure OT Treatments $Self Care/Home Management : 8-22 mins G-Codes:    [place] Devine M 11-25-14, 5:30 PM

## 2014-11-21 DIAGNOSIS — I959 Hypotension, unspecified: Secondary | ICD-10-CM

## 2014-11-21 DIAGNOSIS — K651 Peritoneal abscess: Secondary | ICD-10-CM | POA: Insufficient documentation

## 2014-11-21 DIAGNOSIS — N390 Urinary tract infection, site not specified: Secondary | ICD-10-CM | POA: Insufficient documentation

## 2014-11-21 LAB — BASIC METABOLIC PANEL
Anion gap: 15 (ref 5–15)
BUN: 19 mg/dL (ref 6–23)
CHLORIDE: 109 meq/L (ref 96–112)
CO2: 18 mEq/L — ABNORMAL LOW (ref 19–32)
Calcium: 7.9 mg/dL — ABNORMAL LOW (ref 8.4–10.5)
Creatinine, Ser: 2 mg/dL — ABNORMAL HIGH (ref 0.50–1.35)
GFR calc Af Amer: 35 mL/min — ABNORMAL LOW (ref >90)
GFR calc non Af Amer: 30 mL/min — ABNORMAL LOW (ref >90)
Glucose, Bld: 83 mg/dL (ref 70–99)
POTASSIUM: 4.3 meq/L (ref 3.7–5.3)
Sodium: 142 mEq/L (ref 137–147)

## 2014-11-21 LAB — CBC
HEMATOCRIT: 37.2 % — AB (ref 39.0–52.0)
HEMOGLOBIN: 12.3 g/dL — AB (ref 13.0–17.0)
MCH: 30.8 pg (ref 26.0–34.0)
MCHC: 33.1 g/dL (ref 30.0–36.0)
MCV: 93.2 fL (ref 78.0–100.0)
Platelets: 145 10*3/uL — ABNORMAL LOW (ref 150–400)
RBC: 3.99 MIL/uL — ABNORMAL LOW (ref 4.22–5.81)
RDW: 13.6 % (ref 11.5–15.5)
WBC: 8.8 10*3/uL (ref 4.0–10.5)

## 2014-11-21 LAB — CULTURE, ROUTINE-ABSCESS

## 2014-11-21 MED ORDER — CIPROFLOXACIN HCL 500 MG PO TABS
500.0000 mg | ORAL_TABLET | Freq: Two times a day (BID) | ORAL | Status: DC
  Administered 2014-11-21 – 2014-11-22 (×2): 500 mg via ORAL
  Filled 2014-11-21 (×4): qty 1

## 2014-11-21 NOTE — Progress Notes (Signed)
Paged dr. Venetia Constable for medications for hemorrhoids, awaiting call back. Monitoring will continue.

## 2014-11-21 NOTE — Clinical Social Work Note (Signed)
CSW received referral for SNF.  Case discussed with case manager and family.  Plan is to discharge home with home health.  CSW to sign off please re-consult if social work needs arise.  Jones Broom. [place], MSW, [place]

## 2014-11-21 NOTE — Progress Notes (Signed)
Referring Physician(s): TRH  Subjective:  Bladder wall/diverticular abscess drain placed 11/21 Much better Plan for procedure with Dr Tresa Moore - Urology 2 weeks  Allergies: Review of patient's allergies indicates no known allergies.  Medications: Prior to Admission medications   Medication Sig Start Date End Date Taking? Authorizing Provider  amLODipine-benazepril (LOTREL) 10-20 MG per capsule Take 1 capsule by mouth daily.   Yes Historical Provider, MD  aspirin 81 MG tablet Take 81 mg by mouth daily.   Yes Historical Provider, MD  ciprofloxacin (CIPRO) 250 MG tablet Take 250 mg by mouth 2 (two) times daily.   Yes Historical Provider, MD  clopidogrel (PLAVIX) 75 MG tablet Take 75 mg by mouth daily.   Yes Historical Provider, MD  donepezil (ARICEPT) 5 MG tablet Take 5 mg by mouth at bedtime.   Yes Historical Provider, MD  levothyroxine (SYNTHROID, LEVOTHROID) 50 MCG tablet Take 50 mcg by mouth daily before breakfast.   Yes Historical Provider, MD  LUMIGAN 0.01 % SOLN Place 1 drop into both eyes at bedtime. 10/12/14  Yes Historical Provider, MD  pantoprazole (PROTONIX) 40 MG tablet Take 40 mg by mouth daily.   Yes Historical Provider, MD  Probiotic Product (ALIGN) 4 MG CAPS Take 1 capsule by mouth daily.   Yes Historical Provider, MD  simvastatin (ZOCOR) 40 MG tablet Take 40 mg by mouth daily.   Yes Historical Provider, MD    Review of Systems  Vital Signs: BP 130/53 mmHg  Pulse 62  Temp(Src) 98.4 F (36.9 C) (Oral)  Resp 18  Ht 5\' 10"  (1.778 m)  Wt 106.505 kg (234 lb 12.8 oz)  BMI 33.69 kg/m2  SpO2 94%  Physical Exam  Abdominal:  afeb abd soft Site clean and dry NT Output feculent 20 cc yesterday 5 cc in JP Cx: Ecoli     Imaging: Ct Abdomen Pelvis Wo Contrast  11/17/2014   CLINICAL DATA:  Subsequent encounter for abdominal pain and diarrhea. Pain in the right lower quadrant and difficulty urinating.  EXAM: CT ABDOMEN AND PELVIS WITHOUT CONTRAST  TECHNIQUE:  Multidetector CT imaging of the abdomen and pelvis was performed following the standard protocol without IV contrast.  COMPARISON:  05/05/2005  FINDINGS: Lower chest: Compressive atelectasis noted in the dependent lung bases bilaterally.  Hepatobiliary: 9 mm low-density lesion in the dome of the left liver is probably a cyst. Liver with otherwise unremarkable on infused appearance. Multiple calcified gallstones measure up to 7 mm in diameter. No intrahepatic or extrahepatic biliary dilation.  Pancreas: No focal mass lesion. No dilatation of the main duct. No intraparenchymal cyst. No peripancreatic edema.  Spleen: No splenomegaly. No focal mass lesion.  Adrenals/Urinary Tract: No adrenal nodule or mass. 2.5 cm water density lesion in the interpolar right kidney is likely a cyst. 2.4 cm water density lesion in the lower pole the left kidney is also likely a cyst. No evidence for stone disease in either kidney. No evidence for hydroureter or ureteral stone disease. Bladder is mildly distended and shows circumferential bladder wall thickening. Associated with the anterior bladder dome is marked wall thickening with end immediately contiguous 4.3 x 3.8 x 3.8 cm collection of debris and gas. This collection appears tether tube both in the dome of the bladder in the mid sigmoid colon. Surrounding edema/ inflammation is evident. There is a small amount a gas visible in the lumen of the urinary bladder.  Stomach/Bowel: Stomach is nondistended. No gastric wall thickening. No evidence of outlet obstruction. Duodenum is normally  positioned as is the ligament of Treitz. No small bowel wall thickening. No small bowel dilatation. Terminal ileum and appendix are normal. Advanced diverticulosis is noted in the sigmoid colon. Please see adrenal urinary tract section above.  Vascular/Lymphatic: Atherosclerotic calcification is noted in the wall of the abdominal aorta without aneurysm. Right common iliac artery measures 2.6 cm in  maximum diameter. Left common iliac artery measures up to 2.3 cm in diameter.  No gastrohepatic ligament or hepatoduodenal ligament lymphadenopathy. No retroperitoneal lymphadenopathy. No evidence for pelvic sidewall lymphadenopathy.  Reproductive: Prostate gland is enlarged.  Other: No intraperitoneal free fluid. Bilateral inguinal hernias contain only fat.  Musculoskeletal: Mild degenerative changes are seen in the hips bilaterally. No worrisome lytic or sclerotic osseous abnormality.  IMPRESSION: 4.3 cm abscess involving the bladder wall at the dome also appears tethered to the mid transverse colon which shows associated wall thickening. There is some surrounding edema/ inflammation and gas is visible in the urinary bladder. The presence of gas in the bladder is highly suggestive of colovesical fistula, likely secondary to previous diverticulitis.  Probable bilateral renal cysts.  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 Left common iliac artery measures 2.3 cm in diameter.  Insert cholelithiasis.   Electronically Signed   By: Misty Stanley M.D.   On: 11/17/2014 13:04   Ct Image Guided Drainage By Percutaneous Catheter  11/18/2014   CLINICAL DATA:  Focal abscess adjacent to sigmoid colon and likely involving bladder wall with clinical evidence of colovesical fistula. The patient presents for percutaneous drainage of the perivesical/diverticular abscess.  EXAM: CT GUIDED DRAINAGE OF PERITONEAL ABSCESS  ANESTHESIA/SEDATION: 2.0 Mg IV Versed 100 mcg IV Fentanyl  Total Moderate Sedation Time:  15 minutes  PROCEDURE: The procedure, risks, benefits, and alternatives were explained to the patient. Questions regarding the procedure were encouraged and answered. The patient understands and consents to the procedure.  The left lower anterior abdominal wall was prepped with Betadine in a sterile fashion, and a sterile drape was applied covering the operative field. A sterile gown and sterile gloves were used for the  procedure. Local anesthesia was provided with 1% Lidocaine. A time-out procedure was performed.  CT was performed in a supine position. Under CT fluoroscopic guidance, an 18 gauge trocar needle was advanced to the level of the perivesical abscess. After aspirating fluid, a guidewire was advanced into the collection. The tract was dilated and a 10 French percutaneous drain placed. Drainage catheter position was confirmed by CT.  The catheter was further aspirated and flushed with saline. A fluid sample was sent for culture analysis. The catheter was connected to a suction bulb. It was secured at the skin with a Prolene retention suture adhesive StatLock device.  COMPLICATIONS: None  FINDINGS: Abscess cavity located just anterior to the bladder dome and abutting the posterior margin of the mid sigmoid colon now contains more air compared to the prior CT and an air-fluid level measuring approximately 3 cm transversely. The bladder has been decompressed by a Foley catheter and demonstrates wall thickening. The bladder also contains some air with findings again suggestive of a colovesical fistula.  Aspirated fluid at the level of abscess was grossly purulent and feculent. After placement of a drainage catheter, there is good return of fluid with suction bulb drainage.  IMPRESSION: CT-guided percutaneous catheter drainage of pelvic diverticular/perivesical abscess. Grossly purulent and feculent appearing fluid was aspirated and sent for culture. A 10 French drain was placed and attached to suction bulb drainage. Output will be  followed.   Electronically Signed   By: Aletta Edouard M.D.   On: 11/18/2014 11:29    Labs:  CBC:  Recent Labs  11/17/14 1111 11/18/14 0537 11/20/14 0422 11/21/14 0338  WBC 7.0 7.8 8.3 8.8  HGB 12.5* 12.7* 12.2* 12.3*  HCT 38.8* 39.1 37.4* 37.2*  PLT 136* 135* 140* 145*    COAGS:  Recent Labs  11/17/14 1621  INR 1.09  APTT 29    BMP:  Recent Labs  11/18/14 0537  11/19/14 0410 11/20/14 0422 11/21/14 0338  NA 143 145 142 142  K 4.7 4.4 4.2 4.3  CL 108 110 111 109  CO2 17* 18* 19 18*  GLUCOSE 86 95 90 83  BUN 56* 40* 26* 19  CALCIUM 8.2* 7.9* 7.9* 7.9*  CREATININE 2.98* 2.61* 2.13* 2.00*  GFRNONAA 18* 22* 28* 30*  GFRAA 21* 25* 32* 35*    LIVER FUNCTION TESTS:  Recent Labs  11/17/14 1111 11/19/14 0410  BILITOT 0.4 0.3  AST 58* 36  ALT 27 20  ALKPHOS 64 60  PROT 6.8 6.3  ALBUMIN 2.2* 2.1*    Assessment and Plan:  Bladder wall abscess drain intact Will follow Plan for procedure with Uro 2 weeks Home with drain    I spent a total of 20 minutes face to face in clinical consultation/evaluation, greater than 50% of which was counseling/coordinating care for abscess drain  Signed: Lanaya Bennis A 11/21/2014, 11:40 AM

## 2014-11-21 NOTE — Progress Notes (Signed)
Paged Dr. Venetia Constable, pt has hemorrhoid, in needs for medication. Awaiting page. Monitoring will continue.

## 2014-11-21 NOTE — Progress Notes (Signed)
  Subjective: Pt with better abd pain.  Normal BMs  Objective: Vital signs in last 24 hours: Temp:  [97.9 F (36.6 C)-98.4 F (36.9 C)] 98.4 F (36.9 C) (11/24 0556) Pulse Rate:  [61-64] 62 (11/24 0556) Resp:  [18-20] 18 (11/24 0556) BP: (109-130)/(52-81) 130/53 mmHg (11/24 0556) SpO2:  [94 %] 94 % (11/24 0556) Weight:  [234 lb 12.8 oz (106.505 kg)] 234 lb 12.8 oz (106.505 kg) (11/24 0556) Last BM Date: 11/20/14  Intake/Output from previous day: 11/23 0701 - 11/24 0700 In: 320 [P.O.:290] Out: 1820 [Urine:1800; Drains:20] Intake/Output this shift:    General appearance: alert and cooperative Cardio: regular rate and rhythm, S1, S2 normal, no murmur, click, rub or gallop GI: s/nd/min ttp LLQ  Lab Results:   Recent Labs  11/20/14 0422 11/21/14 0338  WBC 8.3 8.8  HGB 12.2* 12.3*  HCT 37.4* 37.2*  PLT 140* 145*   BMET  Recent Labs  11/20/14 0422 11/21/14 0338  NA 142 142  K 4.2 4.3  CL 111 109  CO2 19 18*  GLUCOSE 90 83  BUN 26* 19  CREATININE 2.13* 2.00*  CALCIUM 7.9* 7.9*   PT/INR No results for input(s): LABPROT, INR in the last 72 hours. ABG No results for input(s): PHART, HCO3 in the last 72 hours.  Invalid input(s): PCO2, PO2  Studies/Results: No results found.  Anti-infectives: Anti-infectives    Start     Dose/Rate Route Frequency Ordered Stop   11/19/14 2200  piperacillin-tazobactam (ZOSYN) IVPB 3.375 g     3.375 g12.5 mL/hr over 240 Minutes Intravenous 3 times per day 11/19/14 1549     11/17/14 1430  piperacillin-tazobactam (ZOSYN) IVPB 2.25 g  Status:  Discontinued     2.25 g100 mL/hr over 30 Minutes Intravenous 3 times per day 11/17/14 1351 11/19/14 1549      Assessment/Plan:  DIverticulitis and abscess with IR drain Advance diet to soft Con't abx Mobilize Hopefully home in next 1-2 d with outpt workup and f/u with GS and URO  LOS: 4 days    Rosario Jacks., [facility] 11/21/2014

## 2014-11-21 NOTE — Clinical Social Work Note (Signed)
Received phone call from patient's daughter Darol Destine who stated that two of her sisters are willing to stay with patient for a couple of weeks.  Patient's family have all agreed that they would rather have home health set up for patient.  Family would like to talk to case manager to discuss home health.  Informed patient's daughter that CSW will inform CM to talk to family.  Jones Broom. [place], MSW, [place] 11/21/2014 8:51 AM

## 2014-11-21 NOTE — Progress Notes (Signed)
[facility] PROGRESS NOTE Interim History: 80 y/o male with PMH of HTN, BPH, prior h/o CVA, dementia presented with diarrhea, abd pain found to Sigmoid diverticulitis with abscess, possible colovesical fistula    Assessment/Plan:  Abscess of abdominal cavity involving the dome of the bladder possible complication from a untreated diverticular abscess: - Cont foley cat per urology. - Surgery was consulted and recommended IR to place a drain which was on 11/18/2014 she was started on IV Zosyn empirically. - Cultures from abscess grew Escherichia coli sensitive to Bactrim and Cipro. - Follow-up with Dr. Bess Harvest as an outpatient  UTI (lower urinary tract infection): - On Zosyn during culture negative till date.  Hypotension: - Likely due to sepsis, BP is stable now. Amlodipine was resumed. - Continue to hold a stitch renal failure.  Diarrhea: - Likely due to infectious etiology CV negative negative.  Acute kidney injury: - Most likely prerenal due to volume loss due to diarrhea. - ACE-I was held start her on IV fluids creatinine continues to improve   Code Status: full Family Communication: d/w patient Disposition Plan: pend clinical improvement, ? SNF per family request    Consultants:  Urology   surgery  Procedures:  Percutaneous drain placement space 11/21  Antibiotics:  Zosyn 11.20>>11.24  cipro  HPI/Subjective: No complains feels good  Objective: Filed Vitals:   11/20/14 1400 11/20/14 2204 11/21/14 0556 11/21/14 1100  BP: 109/81 130/52 130/53   Pulse: 64 61 62   Temp: 97.9 F (36.6 C) 98 F (36.7 C) 98.4 F (36.9 C)   TempSrc: Oral Oral Oral   Resp: 20 18 18    Height:    5\' 10"  (1.778 m)  Weight:   106.505 kg (234 lb 12.8 oz)   SpO2: 94% 94% 94%     Intake/Output Summary (Last 24 hours) at 11/21/14 1236 Last data filed at 11/21/14 0556  Gross per 24 hour  Intake    190 ml  Output   1820 ml  Net  -1630 ml   Filed Weights   11/21/14  0556  Weight: 106.505 kg (234 lb 12.8 oz)    Exam:  General: Alert, awake, oriented x3, in no acute distress.  HEENT: No bruits, no goiter.  Heart: Regular rate and rhythm. Lungs: Good air movement, clear  Abdomen: Soft, nontender, nondistended, positive bowel sounds.     Data Reviewed: Basic Metabolic Panel:  Recent Labs Lab 11/17/14 1111 11/18/14 0537 11/19/14 0410 11/20/14 0422 11/21/14 0338  NA 135* 143 145 142 142  K 4.3 4.7 4.4 4.2 4.3  CL 102 108 110 111 109  CO2 18* 17* 18* 19 18*  GLUCOSE 116* 86 95 90 83  BUN 67* 56* 40* 26* 19  CREATININE 3.62* 2.98* 2.61* 2.13* 2.00*  CALCIUM 8.2* 8.2* 7.9* 7.9* 7.9*   Liver Function Tests:  Recent Labs Lab 11/17/14 1111 11/19/14 0410  AST 58* 36  ALT 27 20  ALKPHOS 64 60  BILITOT 0.4 0.3  PROT 6.8 6.3  ALBUMIN 2.2* 2.1*    Recent Labs Lab 11/17/14 1111  LIPASE 119*   No results for input(s): AMMONIA in the last 168 hours. CBC:  Recent Labs Lab 11/17/14 1111 11/18/14 0537 11/20/14 0422 11/21/14 0338  WBC 7.0 7.8 8.3 8.8  NEUTROABS 5.3  --   --   --   HGB 12.5* 12.7* 12.2* 12.3*  HCT 38.8* 39.1 37.4* 37.2*  MCV 92.4 93.8 91.2 93.2  PLT 136* 135* 140* 145*   Cardiac Enzymes:  No results for input(s): CKTOTAL, CKMB, CKMBINDEX, TROPONINI in the last 168 hours. BNP (last 3 results) No results for input(s): PROBNP in the last 8760 hours. CBG: No results for input(s): GLUCAP in the last 168 hours.  Recent Results (from the past 240 hour(s))  Urine culture     Status: None   Collection Time: 11/17/14  5:00 PM  Result Value Ref Range Status   Specimen Description URINE, CATHETERIZED  Final   Special Requests NONE  Final   Culture  Setup Time   Final    11/17/2014 23:04 Performed at [place] Performed at [facility]   Final   Culture NO GROWTH Performed at [facility]   Final   Report Status 11/18/2014 FINAL  Final  Culture,  routine-abscess     Status: None   Collection Time: 11/18/14 10:52 AM  Result Value Ref Range Status   Specimen Description ABSCESS  Final   Special Requests PELVIC DIVERTICULAR ABSCESS  Final   Gram Stain   Final    ABUNDANT WBC PRESENT,BOTH PMN AND MONONUCLEAR NO SQUAMOUS EPITHELIAL CELLS SEEN MODERATE GRAM NEGATIVE RODS RARE GRAM POSITIVE RODS Performed at [facility]    Culture   Final    RARE ESCHERICHIA COLI Performed at [facility]    Report Status 11/21/2014 FINAL  Final   Organism ID, Bacteria ESCHERICHIA COLI  Final      Susceptibility   Escherichia coli - MIC*    AMPICILLIN >=32 RESISTANT Resistant     AMPICILLIN/SULBACTAM 16 INTERMEDIATE Intermediate     CEFAZOLIN <=4 SENSITIVE Sensitive     CEFEPIME <=1 SENSITIVE Sensitive     CEFTAZIDIME <=1 SENSITIVE Sensitive     CEFTRIAXONE <=1 SENSITIVE Sensitive     CIPROFLOXACIN <=0.25 SENSITIVE Sensitive     GENTAMICIN <=1 SENSITIVE Sensitive     IMIPENEM <=0.25 SENSITIVE Sensitive     PIP/TAZO <=4 SENSITIVE Sensitive     TOBRAMYCIN <=1 SENSITIVE Sensitive     TRIMETH/SULFA <=20 SENSITIVE Sensitive     * RARE ESCHERICHIA COLI  Anaerobic culture     Status: None (Preliminary result)   Collection Time: 11/18/14 10:52 AM  Result Value Ref Range Status   Specimen Description ABSCESS  Final   Special Requests PELVIC DIVERTICULAR ABSCESS  Final   Gram Stain PENDING  Incomplete   Culture   Final    NO ANAEROBES ISOLATED; CULTURE IN PROGRESS FOR 5 DAYS Performed at [facility]    Report Status PENDING  Incomplete     Studies: No results found.  Scheduled Meds: . aspirin EC  81 mg Oral Daily  . donepezil  5 mg Oral QHS  . latanoprost  1 drop Both Eyes QHS  . levothyroxine  50 mcg Oral QAC breakfast  . pantoprazole  40 mg Oral Daily  . piperacillin-tazobactam (ZOSYN)  IV  3.375 g Intravenous 3 times per day  . saccharomyces boulardii  250 mg Oral Daily  . simvastatin  40 mg Oral q1800    Continuous Infusions: . sodium chloride 50 mL/hr at 11/20/14 Sobieski, ABRAHAM  [facility] Pager [phone]. If 8PM-8AM, please contact night-coverage at [email or web address], password [facility] 11/21/2014, 12:36 PM  LOS: 4 days

## 2014-11-21 NOTE — Progress Notes (Signed)
Emptied 20cc from JP drain. Drainage has a fecal odor. JP drain was flushed twice today with 10cc ns. Monitoring will continue.

## 2014-11-22 DIAGNOSIS — K632 Fistula of intestine: Secondary | ICD-10-CM

## 2014-11-22 LAB — BASIC METABOLIC PANEL
Anion gap: 12 (ref 5–15)
BUN: 14 mg/dL (ref 6–23)
CHLORIDE: 111 meq/L (ref 96–112)
CO2: 19 mEq/L (ref 19–32)
Calcium: 7.9 mg/dL — ABNORMAL LOW (ref 8.4–10.5)
Creatinine, Ser: 1.76 mg/dL — ABNORMAL HIGH (ref 0.50–1.35)
GFR calc Af Amer: 40 mL/min — ABNORMAL LOW (ref >90)
GFR calc non Af Amer: 35 mL/min — ABNORMAL LOW (ref >90)
GLUCOSE: 83 mg/dL (ref 70–99)
POTASSIUM: 4 meq/L (ref 3.7–5.3)
Sodium: 142 mEq/L (ref 137–147)

## 2014-11-22 LAB — ANA, BODY FLUID: Anti-Nuclear Ab, IgG: NOT DETECTED

## 2014-11-22 MED ORDER — METRONIDAZOLE 500 MG PO TABS: 500.0000 mg | ORAL_TABLET | Freq: Three times a day (TID) | ORAL | Status: DC

## 2014-11-22 MED ORDER — CIPROFLOXACIN HCL 500 MG PO TABS: 500.0000 mg | ORAL_TABLET | Freq: Two times a day (BID) | ORAL | Status: DC

## 2014-11-22 MED ORDER — METRONIDAZOLE 500 MG PO TABS
500.0000 mg | ORAL_TABLET | Freq: Three times a day (TID) | ORAL | Status: DC
  Filled 2014-11-22 (×3): qty 1

## 2014-11-22 NOTE — Plan of Care (Signed)
Problem: Phase I Progression Outcomes Goal: OOB as tolerated unless otherwise ordered Outcome: Completed/Met Date Met:  11/22/14     

## 2014-11-22 NOTE — Discharge Summary (Signed)
Physician Discharge Summary  Robert Barnett [id number] DOB: 04/26/1932 DOA: 11/17/2014  PCP: Delphina Cahill, MD  Admit date: 11/17/2014 Discharge date: 11/22/2014  Time spent: 40  minutes  Recommendations for Outpatient Follow-up:  1. Follow up with urology in 2 week for cystoscopy and foley removal 2. Follow up with surgery for  3. PT/OT to follow up at home. 4. SW for home 5. Home health aid for home  Discharge Diagnoses:  Principal Problem:   Abdominal colovesical fistula Active Problems:   UTI (urinary tract infection)   Abdominal abscess   Hypotension   Diarrhea   Hyperlipidemia   Dysphagia   Stroke   BPH (benign prostatic hyperplasia)   Abscess of abdominal cavity   UTI (lower urinary tract infection)   Discharge Condition: guarded  Diet recommendation: heart healthy  Filed Weights   11/21/14 0556  Weight: 106.505 kg (234 lb 12.8 oz)    History of present illness:  80 year old male with hypertension, hyperlipidemia, prior stroke, dysphagia, BPH presented with abdominal pain and diarrhea. 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 Patient's daughters provided most of the history. Patient's daughter reported that he had been complaining of abdominal pain, lower, and suprapubic region with urinary incontinence and diarrhea. Patient went to his PCP, abdominal ultrasound showed cholelithiasis with bilateral renal cysts. Patient denied any fevers, chills or nausea or vomiting. Patient's abdominal pain did not improve and he was brought to ED for further evaluation. 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  Hospital Course:  Abscess of abdominal  cavity involving the dome of the bladder possible complication from a untreated diverticular abscess: - Cont foley cat per urology. - Surgery was consulted and recommended IR to place a drain which was on 11/18/2014 she was started on IV Zosyn empirically. - Cultures from abscess grew Escherichia coli sensitive to Bactrim and Cipro. - Follow-up with Dr. Bess Harvest as an outpatient  UTI (lower urinary tract infection): - On Zosyn during culture negative till date.  Hypotension: - Likely due to sepsis, BP is stable now. Amlodipine was resumed. - Continue to hold a stitch renal failure.  Diarrhea: - Likely due to infectious etiology CV negative negative.  Acute kidney injury: - Most likely prerenal due to volume loss due to diarrhea. - ACE-I was held start her on IV fluids creatinine continues to improve   Procedures:  Percutaneous drain 11.21.2015  Consultations:  Urology  General surgery  Discharge Exam: Filed Vitals:   11/22/14 0440  BP: 142/65  Pulse: 60  Temp: 97.6 F (36.4 C)  Resp: 20    General: A&O x3 Cardiovascular: RRR Respiratory: good air movement CTA B/L  Discharge Instructions You were cared for by a hospitalist during your hospital stay. If you have any questions about your discharge medications or the care you received while you were in the hospital after you are discharged, you can call the unit and asked to speak with the hospitalist on call if the hospitalist that took care of you is not available. Once you are discharged, your primary care physician will handle any further medical issues. Please note that NO REFILLS for any discharge medications will be authorized once you are discharged, as it is imperative  that you return to your primary care physician (or establish a relationship with a primary care physician if you do not have one) for your aftercare needs so that they can reassess your need for medications and monitor your lab values.  Discharge  Instructions    Diet - low sodium heart healthy    Complete by:  As directed      Increase activity slowly    Complete by:  As directed           Current Discharge Medication List    START taking these medications   Details  metroNIDAZOLE (FLAGYL) 500 MG tablet Take 1 tablet (500 mg total) by mouth every 8 (eight) hours. Qty: 42 tablet, Refills: 0      CONTINUE these medications which have CHANGED   Details  ciprofloxacin (CIPRO) 500 MG tablet Take 1 tablet (500 mg total) by mouth 2 (two) times daily. Qty: 14 tablet, Refills: 0      CONTINUE these medications which have NOT CHANGED   Details  amLODipine-benazepril (LOTREL) 10-20 MG per capsule Take 1 capsule by mouth daily.    aspirin 81 MG tablet Take 81 mg by mouth daily.    clopidogrel (PLAVIX) 75 MG tablet Take 75 mg by mouth daily.    donepezil (ARICEPT) 5 MG tablet Take 5 mg by mouth at bedtime.    levothyroxine (SYNTHROID, LEVOTHROID) 50 MCG tablet Take 50 mcg by mouth daily before breakfast.    LUMIGAN 0.01 % SOLN Place 1 drop into both eyes at bedtime. Refills: 0    pantoprazole (PROTONIX) 40 MG tablet Take 40 mg by mouth daily.    Probiotic Product (ALIGN) 4 MG CAPS Take 1 capsule by mouth daily.    simvastatin (ZOCOR) 40 MG tablet Take 40 mg by mouth daily.       No Known Allergies Follow-up Information    Follow up with Delphina Cahill, MD.   Specialty:  Internal Medicine   Contact information:    [place] [address] [phone]       Follow up with Alexis Frock, MD In 1 week.   Specialty:  Urology   Why:  foley cath   Contact information:   [place] [address] [phone]       Follow up with Reyes Ivan, MD In 2 weeks.   Specialty:  General Surgery   Why:  hospital follow up   Contact information:   [address] [address] [phone]        The results of significant diagnostics from this hospitalization (including  imaging, microbiology, ancillary and laboratory) are listed below for reference.    Significant Diagnostic Studies: Ct Abdomen Pelvis Wo Contrast  11/17/2014   CLINICAL DATA:  Subsequent encounter for abdominal pain and diarrhea. Pain in the right lower quadrant and difficulty urinating.  EXAM: CT ABDOMEN AND PELVIS WITHOUT CONTRAST  TECHNIQUE: Multidetector CT imaging of the abdomen and pelvis was performed following the standard protocol without IV contrast.  COMPARISON:  05/05/2005  FINDINGS: Lower chest: Compressive atelectasis noted in the dependent lung bases bilaterally.  Hepatobiliary: 9 mm low-density lesion in the dome of the left liver is probably a cyst. Liver with otherwise unremarkable on infused appearance. Multiple calcified gallstones measure up to 7 mm in diameter. No intrahepatic or extrahepatic biliary dilation.  Pancreas: No focal mass lesion. No dilatation of the main duct. No intraparenchymal cyst. No peripancreatic edema.  Spleen: No  splenomegaly. No focal mass lesion.  Adrenals/Urinary Tract: No adrenal nodule or mass. 2.5 cm water density lesion in the interpolar right kidney is likely a cyst. 2.4 cm water density lesion in the lower pole the left kidney is also likely a cyst. No evidence for stone disease in either kidney. No evidence for hydroureter or ureteral stone disease. Bladder is mildly distended and shows circumferential bladder wall thickening. Associated with the anterior bladder dome is marked wall thickening with end immediately contiguous 4.3 x 3.8 x 3.8 cm collection of debris and gas. This collection appears tether tube both in the dome of the bladder in the mid sigmoid colon. Surrounding edema/ inflammation is evident. There is a small amount a gas visible in the lumen of the urinary bladder.  Stomach/Bowel: Stomach is nondistended. No gastric wall thickening. No evidence of outlet obstruction. Duodenum is normally positioned as is the ligament of Treitz. No small  bowel wall thickening. No small bowel dilatation. Terminal ileum and appendix are normal. Advanced diverticulosis is noted in the sigmoid colon. Please see adrenal urinary tract section above.  Vascular/Lymphatic: Atherosclerotic calcification is noted in the wall of the abdominal aorta without aneurysm. Right common iliac artery measures 2.6 cm in maximum diameter. Left common iliac artery measures up to 2.3 cm in diameter.  No gastrohepatic ligament or hepatoduodenal ligament lymphadenopathy. No retroperitoneal lymphadenopathy. No evidence for pelvic sidewall lymphadenopathy.  Reproductive: Prostate gland is enlarged.  Other: No intraperitoneal free fluid. Bilateral inguinal hernias contain only fat.  Musculoskeletal: Mild degenerative changes are seen in the hips bilaterally. No worrisome lytic or sclerotic osseous abnormality.  IMPRESSION: 4.3 cm abscess involving the bladder wall at the dome also appears tethered to the mid transverse colon which shows associated wall thickening. There is some surrounding edema/ inflammation and gas is visible in the urinary bladder. The presence of gas in the bladder is highly suggestive of colovesical fistula, likely secondary to previous diverticulitis.  Probable bilateral renal cysts.  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 Left common iliac artery measures 2.3 cm in diameter.  Insert cholelithiasis.   Electronically Signed   By: Misty Stanley M.D.   On: 11/17/2014 13:04   US Abdomen Complete  11/09/2014   CLINICAL DATA:  Abdominal pain and distension  EXAM: ULTRASOUND ABDOMEN COMPLETE  COMPARISON:  10/12/2013.  FINDINGS: Gallbladder: Cholelithiasis is identified. Mild wall thickening is noted. No pericholecystic fluid or sonographic Percell Miller sign is noted. The wall thickness may be related to partial decompression of the gallbladder.  Common bile duct: Diameter: 3.4 mm.  Liver: No focal lesion identified. Within normal limits in parenchymal echogenicity.  IVC: Not  visualized due to overlying bowel gas.  Pancreas: Not visualized due to overlying bowel gas.  Spleen: Size and appearance within normal limits.  Right Kidney: Length: 9.8 cm. 1.6 cm hypoechoic lesion is noted in the upper pole consistent with a cysts. This is stable in appearance from the prior exam. Mild cortical thinning is noted as well as increased echogenicity.  Left Kidney: Length: 11.8 cm. 2 cm complex cyst is noted in the lower pole. This is stable from the prior exam as well.  Abdominal aorta: No aneurysm visualized.  Other findings: None.  IMPRESSION: Bilateral renal cystic change.  Cholelithiasis   Electronically Signed   By: Inez Catalina M.D.   On: 11/09/2014 12:45   Ct Image Guided Drainage By Percutaneous Catheter  11/18/2014   CLINICAL DATA:  Focal abscess adjacent to sigmoid colon and likely involving bladder  wall with clinical evidence of colovesical fistula. The patient presents for percutaneous drainage of the perivesical/diverticular abscess.  EXAM: CT GUIDED DRAINAGE OF PERITONEAL ABSCESS  ANESTHESIA/SEDATION: 2.0 Mg IV Versed 100 mcg IV Fentanyl  Total Moderate Sedation Time:  15 minutes  PROCEDURE: The procedure, risks, benefits, and alternatives were explained to the patient. Questions regarding the procedure were encouraged and answered. The patient understands and consents to the procedure.  The left lower anterior abdominal wall was prepped with Betadine in a sterile fashion, and a sterile drape was applied covering the operative field. A sterile gown and sterile gloves were used for the procedure. Local anesthesia was provided with 1% Lidocaine. A time-out procedure was performed.  CT was performed in a supine position. Under CT fluoroscopic guidance, an 18 gauge trocar needle was advanced to the level of the perivesical abscess. After aspirating fluid, a guidewire was advanced into the collection. The tract was dilated and a 10 French percutaneous drain placed. Drainage catheter  position was confirmed by CT.  The catheter was further aspirated and flushed with saline. A fluid sample was sent for culture analysis. The catheter was connected to a suction bulb. It was secured at the skin with a Prolene retention suture adhesive StatLock device.  COMPLICATIONS: None  FINDINGS: Abscess cavity located just anterior to the bladder dome and abutting the posterior margin of the mid sigmoid colon now contains more air compared to the prior CT and an air-fluid level measuring approximately 3 cm transversely. The bladder has been decompressed by a Foley catheter and demonstrates wall thickening. The bladder also contains some air with findings again suggestive of a colovesical fistula.  Aspirated fluid at the level of abscess was grossly purulent and feculent. After placement of a drainage catheter, there is good return of fluid with suction bulb drainage.  IMPRESSION: CT-guided percutaneous catheter drainage of pelvic diverticular/perivesical abscess. Grossly purulent and feculent appearing fluid was aspirated and sent for culture. A 10 French drain was placed and attached to suction bulb drainage. Output will be followed.   Electronically Signed   By: Aletta Edouard M.D.   On: 11/18/2014 11:29    Microbiology: Recent Results (from the past 240 hour(s))  Urine culture     Status: None   Collection Time: 11/17/14  5:00 PM  Result Value Ref Range Status   Specimen Description URINE, CATHETERIZED  Final   Special Requests NONE  Final   Culture  Setup Time   Final    11/17/2014 23:04 Performed at [place] Performed at [facility]   Final   Culture NO GROWTH Performed at [facility]   Final   Report Status 11/18/2014 FINAL  Final  Culture, routine-abscess     Status: None   Collection Time: 11/18/14 10:52 AM  Result Value Ref Range Status   Specimen Description ABSCESS  Final   Special Requests PELVIC DIVERTICULAR ABSCESS   Final   Gram Stain   Final    ABUNDANT WBC PRESENT,BOTH PMN AND MONONUCLEAR NO SQUAMOUS EPITHELIAL CELLS SEEN MODERATE GRAM NEGATIVE RODS RARE GRAM POSITIVE RODS Performed at [facility]    Culture   Final    RARE ESCHERICHIA COLI Performed at [facility]    Report Status 11/21/2014 FINAL  Final   Organism ID, Bacteria ESCHERICHIA COLI  Final      Susceptibility   Escherichia coli - MIC*    AMPICILLIN >=32 RESISTANT Resistant  AMPICILLIN/SULBACTAM 16 INTERMEDIATE Intermediate     CEFAZOLIN <=4 SENSITIVE Sensitive     CEFEPIME <=1 SENSITIVE Sensitive     CEFTAZIDIME <=1 SENSITIVE Sensitive     CEFTRIAXONE <=1 SENSITIVE Sensitive     CIPROFLOXACIN <=0.25 SENSITIVE Sensitive     GENTAMICIN <=1 SENSITIVE Sensitive     IMIPENEM <=0.25 SENSITIVE Sensitive     PIP/TAZO <=4 SENSITIVE Sensitive     TOBRAMYCIN <=1 SENSITIVE Sensitive     TRIMETH/SULFA <=20 SENSITIVE Sensitive     * RARE ESCHERICHIA COLI  Anaerobic culture     Status: None (Preliminary result)   Collection Time: 11/18/14 10:52 AM  Result Value Ref Range Status   Specimen Description ABSCESS  Final   Special Requests PELVIC DIVERTICULAR ABSCESS  Final   Gram Stain PENDING  Incomplete   Culture   Final    NO ANAEROBES ISOLATED; CULTURE IN PROGRESS FOR 5 DAYS Performed at [facility]    Report Status PENDING  Incomplete     Labs: Basic Metabolic Panel:  Recent Labs Lab 11/18/14 0537 11/19/14 0410 11/20/14 0422 11/21/14 0338 11/22/14 0435  NA 143 145 142 142 142  K 4.7 4.4 4.2 4.3 4.0  CL 108 110 111 109 111  CO2 17* 18* 19 18* 19  GLUCOSE 86 95 90 83 83  BUN 56* 40* 26* 19 14  CREATININE 2.98* 2.61* 2.13* 2.00* 1.76*  CALCIUM 8.2* 7.9* 7.9* 7.9* 7.9*   Liver Function Tests:  Recent Labs Lab 11/17/14 1111 11/19/14 0410  AST 58* 36  ALT 27 20  ALKPHOS 64 60  BILITOT 0.4 0.3  PROT 6.8 6.3  ALBUMIN 2.2* 2.1*    Recent Labs Lab 11/17/14 1111  LIPASE 119*   No  results for input(s): AMMONIA in the last 168 hours. CBC:  Recent Labs Lab 11/17/14 1111 11/18/14 0537 11/20/14 0422 11/21/14 0338  WBC 7.0 7.8 8.3 8.8  NEUTROABS 5.3  --   --   --   HGB 12.5* 12.7* 12.2* 12.3*  HCT 38.8* 39.1 37.4* 37.2*  MCV 92.4 93.8 91.2 93.2  PLT 136* 135* 140* 145*   Cardiac Enzymes: No results for input(s): CKTOTAL, CKMB, CKMBINDEX, TROPONINI in the last 168 hours. BNP: BNP (last 3 results) No results for input(s): PROBNP in the last 8760 hours. CBG: No results for input(s): GLUCAP in the last 168 hours.     Signed:  Charlynne Cousins  [facility] 11/22/2014, 11:00 AM

## 2014-11-22 NOTE — Progress Notes (Signed)
  Subjective: Pain better.  tol PO.  Normal BMs  Objective: Vital signs in last 24 hours: Temp:  [97.6 F (36.4 C)-97.9 F (36.6 C)] 97.6 F (36.4 C) (11/25 0440) Pulse Rate:  [60-61] 60 (11/25 0440) Resp:  [17-20] 20 (11/25 0440) BP: (101-142)/(48-65) 142/65 mmHg (11/25 0440) SpO2:  [95 %-96 %] 96 % (11/25 0440) Last BM Date: 11/21/14  Intake/Output from previous day: 11/24 0701 - 11/25 0700 In: 30  Out: 1500 [Urine:1500] Intake/Output this shift:    General appearance: alert and cooperative Resp: clear to auscultation bilaterally Cardio: regular rate and rhythm, S1, S2 normal, no murmur, click, rub or gallop GI: soft min ttp LLQ  Lab Results:   Recent Labs  11/20/14 0422 11/21/14 0338  WBC 8.3 8.8  HGB 12.2* 12.3*  HCT 37.4* 37.2*  PLT 140* 145*   BMET  Recent Labs  11/21/14 0338 11/22/14 0435  NA 142 142  K 4.3 4.0  CL 109 111  CO2 18* 19  GLUCOSE 83 83  BUN 19 14  CREATININE 2.00* 1.76*  CALCIUM 7.9* 7.9*    Studies/Results: No results found.  Anti-infectives: Anti-infectives    Start     Dose/Rate Route Frequency Ordered Stop   11/21/14 1400  ciprofloxacin (CIPRO) tablet 500 mg     500 mg Oral 2 times daily 11/21/14 1251     11/19/14 2200  piperacillin-tazobactam (ZOSYN) IVPB 3.375 g  Status:  Discontinued     3.375 g12.5 mL/hr over 240 Minutes Intravenous 3 times per day 11/19/14 1549 11/21/14 1251   11/17/14 1430  piperacillin-tazobactam (ZOSYN) IVPB 2.25 g  Status:  Discontinued     2.25 g100 mL/hr over 30 Minutes Intravenous 3 times per day 11/17/14 1351 11/19/14 1549      Assessment/Plan: Diverticulitis Colovesical fistula  OK to adv diet as tol con't abx.  Will need to be Dc'd with a total of 2 weeks cipro/flagyl Uro w/u outpt as per Dr. Tresa Moore   LOS: 5 days    Rosario Jacks., [facility] 11/22/2014

## 2014-11-22 NOTE — Plan of Care (Signed)
Problem: Phase III Progression Outcomes Goal: Pain controlled on oral analgesia Outcome: Progressing     

## 2014-11-23 LAB — ANAEROBIC CULTURE

## 2014-11-24 ENCOUNTER — Emergency Department (HOSPITAL_COMMUNITY)
Admission: EM | Admit: 2014-11-24 | Discharge: 2014-11-24 | Disposition: A | Discharge status: Home / Self Care | Payer: Medicare PPO | Attending: Emergency Medicine | Admitting: Emergency Medicine

## 2014-11-24 ENCOUNTER — Emergency Department (HOSPITAL_COMMUNITY): Payer: Medicare PPO

## 2014-11-24 ENCOUNTER — Encounter (HOSPITAL_COMMUNITY): Payer: Self-pay | Admitting: *Deleted

## 2014-11-24 DIAGNOSIS — Z792 Long term (current) use of antibiotics: Secondary | ICD-10-CM | POA: Diagnosis not present

## 2014-11-24 DIAGNOSIS — Z87891 Personal history of nicotine dependence: Secondary | ICD-10-CM | POA: Insufficient documentation

## 2014-11-24 DIAGNOSIS — I10 Essential (primary) hypertension: Secondary | ICD-10-CM | POA: Insufficient documentation

## 2014-11-24 DIAGNOSIS — R1030 Lower abdominal pain, unspecified: Secondary | ICD-10-CM | POA: Insufficient documentation

## 2014-11-24 DIAGNOSIS — Z79899 Other long term (current) drug therapy: Secondary | ICD-10-CM | POA: Insufficient documentation

## 2014-11-24 DIAGNOSIS — N390 Urinary tract infection, site not specified: Secondary | ICD-10-CM | POA: Insufficient documentation

## 2014-11-24 DIAGNOSIS — Z7902 Long term (current) use of antithrombotics/antiplatelets: Secondary | ICD-10-CM | POA: Insufficient documentation

## 2014-11-24 DIAGNOSIS — E785 Hyperlipidemia, unspecified: Secondary | ICD-10-CM | POA: Insufficient documentation

## 2014-11-24 DIAGNOSIS — Z7982 Long term (current) use of aspirin: Secondary | ICD-10-CM | POA: Diagnosis not present

## 2014-11-24 DIAGNOSIS — R197 Diarrhea, unspecified: Secondary | ICD-10-CM | POA: Insufficient documentation

## 2014-11-24 DIAGNOSIS — Z8673 Personal history of transient ischemic attack (TIA), and cerebral infarction without residual deficits: Secondary | ICD-10-CM | POA: Diagnosis not present

## 2014-11-24 DIAGNOSIS — M1990 Unspecified osteoarthritis, unspecified site: Secondary | ICD-10-CM | POA: Insufficient documentation

## 2014-11-24 LAB — CBC WITH DIFFERENTIAL/PLATELET
Basophils Absolute: 0 10*3/uL (ref 0.0–0.1)
Basophils Relative: 0 % (ref 0–1)
EOS ABS: 0.1 10*3/uL (ref 0.0–0.7)
EOS PCT: 0 % (ref 0–5)
HCT: 40.5 % (ref 39.0–52.0)
HEMOGLOBIN: 13.3 g/dL (ref 13.0–17.0)
LYMPHS ABS: 0.9 10*3/uL (ref 0.7–4.0)
Lymphocytes Relative: 5 % — ABNORMAL LOW (ref 12–46)
MCH: 29.8 pg (ref 26.0–34.0)
MCHC: 32.8 g/dL (ref 30.0–36.0)
MCV: 90.8 fL (ref 78.0–100.0)
Monocytes Absolute: 1.4 10*3/uL — ABNORMAL HIGH (ref 0.1–1.0)
Monocytes Relative: 7 % (ref 3–12)
Neutro Abs: 16.2 10*3/uL — ABNORMAL HIGH (ref 1.7–7.7)
Neutrophils Relative %: 88 % — ABNORMAL HIGH (ref 43–77)
Platelets: 192 10*3/uL (ref 150–400)
RBC: 4.46 MIL/uL (ref 4.22–5.81)
RDW: 13.5 % (ref 11.5–15.5)
WBC: 18.5 10*3/uL — ABNORMAL HIGH (ref 4.0–10.5)

## 2014-11-24 LAB — URINALYSIS, ROUTINE W REFLEX MICROSCOPIC
Glucose, UA: NEGATIVE mg/dL
KETONES UR: 15 mg/dL — AB
NITRITE: POSITIVE — AB
PROTEIN: 100 mg/dL — AB
SPECIFIC GRAVITY, URINE: 1.018 (ref 1.005–1.030)
UROBILINOGEN UA: 1 mg/dL (ref 0.0–1.0)
pH: 5 (ref 5.0–8.0)

## 2014-11-24 LAB — COMPREHENSIVE METABOLIC PANEL
ALK PHOS: 58 U/L (ref 39–117)
ALT: 43 U/L (ref 0–53)
ANION GAP: 17 — AB (ref 5–15)
AST: 70 U/L — ABNORMAL HIGH (ref 0–37)
Albumin: 2.5 g/dL — ABNORMAL LOW (ref 3.5–5.2)
BUN: 15 mg/dL (ref 6–23)
CO2: 18 meq/L — AB (ref 19–32)
Calcium: 8.6 mg/dL (ref 8.4–10.5)
Chloride: 109 mEq/L (ref 96–112)
Creatinine, Ser: 1.85 mg/dL — ABNORMAL HIGH (ref 0.50–1.35)
GFR calc Af Amer: 38 mL/min — ABNORMAL LOW (ref >90)
GFR, EST NON AFRICAN AMERICAN: 33 mL/min — AB (ref >90)
Glucose, Bld: 148 mg/dL — ABNORMAL HIGH (ref 70–99)
Potassium: 4.4 mEq/L (ref 3.7–5.3)
Sodium: 144 mEq/L (ref 137–147)
TOTAL PROTEIN: 7.4 g/dL (ref 6.0–8.3)
Total Bilirubin: 0.4 mg/dL (ref 0.3–1.2)

## 2014-11-24 LAB — URINE MICROSCOPIC-ADD ON

## 2014-11-24 LAB — LIPASE, BLOOD: Lipase: 88 U/L — ABNORMAL HIGH (ref 11–59)

## 2014-11-24 MED ORDER — DEXTROSE 5 % IV SOLN
1.0000 g | Freq: Once | INTRAVENOUS | Status: AC
  Administered 2014-11-24: 1 g via INTRAVENOUS
  Filled 2014-11-24: qty 10

## 2014-11-24 MED ORDER — OXYCODONE-ACETAMINOPHEN 5-325 MG PO TABS: 1.0000 | ORAL_TABLET | Freq: Four times a day (QID) | ORAL | Status: DC | PRN

## 2014-11-24 MED ORDER — CEPHALEXIN 500 MG PO CAPS: 500.0000 mg | ORAL_CAPSULE | Freq: Two times a day (BID) | ORAL | Status: DC

## 2014-11-24 MED ORDER — IOHEXOL 300 MG/ML  SOLN: 25.0000 mL | Freq: Once | INTRAMUSCULAR | Status: DC | PRN

## 2014-11-24 MED ORDER — SODIUM CHLORIDE 0.9 % IV BOLUS (SEPSIS)
500.0000 mL | INTRAVENOUS | Status: AC
  Administered 2014-11-24: 500 mL via INTRAVENOUS

## 2014-11-24 MED ORDER — IOHEXOL 300 MG/ML  SOLN
80.0000 mL | Freq: Once | INTRAMUSCULAR | Status: AC | PRN
  Administered 2014-11-24: 80 mL via INTRAVENOUS

## 2014-11-24 MED ORDER — HYDROMORPHONE HCL 1 MG/ML IJ SOLN
0.5000 mg | Freq: Once | INTRAMUSCULAR | Status: AC
  Administered 2014-11-24: 0.5 mg via INTRAVENOUS
  Filled 2014-11-24: qty 1

## 2014-11-24 NOTE — ED Notes (Signed)
Patient transported to CT 

## 2014-11-24 NOTE — ED Notes (Signed)
Dr Harrison at bedside

## 2014-11-24 NOTE — ED Notes (Signed)
Foley catheter replaced per Dr. Aline Brochure order due to misplacement of the catheter noticed on ABD CT. Bloody urine back after foley replaced, pt tolerated well and states he has less pressure on his Abd.

## 2014-11-24 NOTE — ED Notes (Signed)
thge pt had a surgical procedure the first of November he just came home 2-3 days ago.  He has a catheter and it is not draining asd well as it was.  Conflicting  Concerns from 2 family members in the room

## 2014-11-24 NOTE — ED Notes (Signed)
Informed Dr.Harrison antibiotic finished

## 2014-11-24 NOTE — ED Notes (Signed)
Discharge instructions given to daughters, voiced understanding

## 2014-11-24 NOTE — ED Provider Notes (Signed)
CSN: [id number]     Arrival date & time 11/24/14  0117 History  This chart was scribed for Pamella Pert, MD by Rayfield Citizen, ED Scribe. This patient was seen in room D34C/D34C and the patient's care was started at 1:36 AM.    Chief Complaint  Patient presents with  . Abdominal Pain   Patient is a 80 y.o. male presenting with abdominal pain. The history is provided by the patient. No language interpreter was used.  Abdominal Pain Pain location:  Generalized Pain radiates to:  Does not radiate Pain severity:  Moderate Onset quality:  Gradual Timing:  Constant Progression:  Worsening Context: previous surgery   Relieved by:  None tried Worsened by:  Urination Ineffective treatments:  None tried Associated symptoms: diarrhea and dysuria   Associated symptoms: no chest pain, no chills, no cough, no fever, no hematuria, no nausea, no sore throat and no vomiting   Diarrhea:    Quality:  Watery   Severity:  Moderate   Timing:  Intermittent   Progression:  Unable to specify Risk factors: being elderly and recent hospitalization      HPI Comments: Robert Barnett is a 80 y.o. male who presents to the Emergency Department complaining of gradually worsening abdominal pain and dysuria; patient has a foley catheter placed at present but notes a burning pain with the sensation of urinating. Family reports that urine was clear but has gradually darkened. Family reports with every meal, patient has brown and watery diarrhea. They deny blood in the stool but do report blood on the tissue after wiping; they explain that his rectal area is "raw." He denies vomiting. Patient denies taking OTC medications at home to improve symptoms.   Patient had a surgical procedure on 11/1; he came home 2-3 days PTA.   Patient has a history of stroke; no other medical problems mentioned.   Past Medical History  Diagnosis Date  . Hypertension   . Hyperlipidemia   . Peripheral vascular disease   . Carotid  artery occlusion   . Arthritis   . Dysphagia   . Stroke     Right hemispheric CVA  . BPH (benign prostatic hyperplasia)   . Umbilical hernia    Past Surgical History  Procedure Laterality Date  . Carotid endarterectomy  10/06/2007    Right CEA by Dr. Amedeo Plenty  . Peg tube placement  2008   No family history on file. History  Substance Use Topics  . Smoking status: Former Research scientist (life sciences)  . Smokeless tobacco: Not on file  . Alcohol Use: No    Review of Systems  Constitutional: Negative for fever and chills.  HENT: Negative for congestion and sore throat.   Respiratory: Negative for cough.   Cardiovascular: Negative for chest pain.  Gastrointestinal: Positive for abdominal pain and diarrhea. Negative for nausea and vomiting.  Genitourinary: Positive for dysuria. Negative for frequency and hematuria.  Musculoskeletal: Negative for back pain.  Skin: Negative for rash.  Neurological: Negative for headaches.  Hematological: Does not bruise/bleed easily.  Psychiatric/Behavioral: Negative for confusion.    Allergies  Review of patient's allergies indicates no known allergies.  Home Medications   Prior to Admission medications   Medication Sig Start Date End Date Taking? Authorizing Provider  amLODipine-benazepril (LOTREL) 10-20 MG per capsule Take 1 capsule by mouth daily.    Historical Provider, MD  aspirin 81 MG tablet Take 81 mg by mouth daily.    Historical Provider, MD  ciprofloxacin (CIPRO) 500 MG tablet Take  1 tablet (500 mg total) by mouth 2 (two) times daily. 11/22/14   Charlynne Cousins, MD  clopidogrel (PLAVIX) 75 MG tablet Take 75 mg by mouth daily.    Historical Provider, MD  donepezil (ARICEPT) 5 MG tablet Take 5 mg by mouth at bedtime.    Historical Provider, MD  levothyroxine (SYNTHROID, LEVOTHROID) 50 MCG tablet Take 50 mcg by mouth daily before breakfast.    Historical Provider, MD  LUMIGAN 0.01 % SOLN Place 1 drop into both eyes at bedtime. 10/12/14   Historical  Provider, MD  metroNIDAZOLE (FLAGYL) 500 MG tablet Take 1 tablet (500 mg total) by mouth every 8 (eight) hours. 11/22/14   Charlynne Cousins, MD  pantoprazole (PROTONIX) 40 MG tablet Take 40 mg by mouth daily.    Historical Provider, MD  Probiotic Product (ALIGN) 4 MG CAPS Take 1 capsule by mouth daily.    Historical Provider, MD  simvastatin (ZOCOR) 40 MG tablet Take 40 mg by mouth daily.    Historical Provider, MD   BP 101/86 mmHg  Pulse 107  Temp(Src) 98.4 F (36.9 C) (Oral)  Resp 16  SpO2 96% Physical Exam  Constitutional: He is oriented to person, place, and time. He appears well-developed and well-nourished.  HENT:  Head: Normocephalic and atraumatic.  Mouth/Throat: Oropharynx is clear and moist. No oropharyngeal exudate.  Eyes: EOM are normal. Pupils are equal, round, and reactive to light.  Cardiovascular: Normal rate, regular rhythm and normal heart sounds.  Exam reveals no gallop and no friction rub.   No murmur heard. Pulmonary/Chest: Effort normal and breath sounds normal. No respiratory distress. He has no wheezes. He has no rales.  Approx 4x2cm breast mass noted in the right breast w/ mild ttp. No cutaneous findings.   Abdominal: Soft. There is tenderness. There is no rebound and no guarding.  Diffuse mild tenderness of the abdomen which is worse in the suprapubic and infraumbilical area Percutaneous drain in the infraumbilical area with mild surrounding erythema   Genitourinary: Penis normal.  Musculoskeletal: Normal range of motion. He exhibits no edema.  Neurological: He is alert and oriented to person, place, and time.  Skin: Skin is warm and dry. No rash noted.  Psychiatric: He has a normal mood and affect. His behavior is normal.  Nursing note and vitals reviewed.   ED Course  Procedures   DIAGNOSTIC STUDIES: Oxygen Saturation is 96% on RA, adequate by my interpretation.    COORDINATION OF CARE: 1:43 AM Discussed treatment plan with pt at bedside and pt  agreed to plan.   Labs Review Labs Reviewed  CBC WITH DIFFERENTIAL - Abnormal; Notable for the following:    WBC 18.5 (*)    Neutrophils Relative % 88 (*)    Neutro Abs 16.2 (*)    Lymphocytes Relative 5 (*)    Monocytes Absolute 1.4 (*)    All other components within normal limits  COMPREHENSIVE METABOLIC PANEL - Abnormal; Notable for the following:    CO2 18 (*)    Glucose, Bld 148 (*)    Creatinine, Ser 1.85 (*)    Albumin 2.5 (*)    AST 70 (*)    GFR calc non Af Amer 33 (*)    GFR calc Af Amer 38 (*)    Anion gap 17 (*)    All other components within normal limits  URINALYSIS, ROUTINE W REFLEX MICROSCOPIC - Abnormal; Notable for the following:    Color, Urine RED (*)    APPearance TURBID (*)  Hgb urine dipstick LARGE (*)    Bilirubin Urine LARGE (*)    Ketones, ur 15 (*)    Protein, ur 100 (*)    Nitrite POSITIVE (*)    Leukocytes, UA MODERATE (*)    All other components within normal limits  LIPASE, BLOOD - Abnormal; Notable for the following:    Lipase 88 (*)    All other components within normal limits  URINE MICROSCOPIC-ADD ON - Abnormal; Notable for the following:    Squamous Epithelial / LPF FEW (*)    Bacteria, UA FEW (*)    All other components within normal limits  URINE CULTURE    Imaging Review Ct Abdomen Pelvis W Contrast  11/24/2014   CLINICAL DATA:  Abdominal pain, lower  EXAM: CT ABDOMEN AND PELVIS WITH CONTRAST  TECHNIQUE: Multidetector CT imaging of the abdomen and pelvis was performed using the standard protocol following bolus administration of intravenous contrast.  CONTRAST:  36mL OMNIPAQUE IOHEXOL 300 MG/ML  SOLN  COMPARISON:  11/18/2014  FINDINGS: BODY WALL: Smoothly circumscribed nodule in the right breast measuring at 3 cm. This is discrete from the subareolar breast tissue appear Bilateral fatty inguinal hernias. Left lower quadrant findings noted below.  LOWER CHEST: Trace right pleural effusion.  No acute findings.  ABDOMEN/PELVIS:   Liver: No pathologic findings  Biliary: Cholelithiasis without acute cholecystitis.  Pancreas: Unremarkable.  Spleen: Unremarkable.  Adrenals: Unremarkable.  Kidneys and ureters: Fullness of the bilateral urinary collecting system likely related to bladder distention. 22 mm cysts noted in the right kidney.  Bladder: Perivesicular stranding, moderate distension, and pneumaturia. There is a Foley catheter which does not reach the bladder, but is inflated within the prostatic urethra. These results were called by telephone at the time of interpretation on 11/24/2014 at 3:33 am to Dr. Pamella Pert , who verbally acknowledged these results.  Reproductive: Prostate enlargement, deforming the bladder base.  Bowel: Diverticulitis inflammatory changes have decreased. An abscess with indwelling percutaneous drain is collapsed. There is gas within the left abdominal wall which is nonspecific and presumably related to the catheter. No bowel obstruction. Negative appendix.  Vascular: Aneurysmal enlargement of the bilateral common iliac arteries, 2.7 cm on the right and 2.1 cm on the left. The right common iliac artery appears occluded. There is also notable stenosis of the distal right external iliac artery.  OSSEOUS: No acute abnormalities.  IMPRESSION: 1. The diverticular abscess is completely decompressed by a percutaneous drain. 2. Pneumaturia and possible cystitis. Give the diverticular disease is in close proximity to the bladder, recommend urinalysis with consideration to followup cystogram (to evaluate for fistula). 3. Malpositioned Foley catheter, with balloon inflated in the prostatic urethra. 4. 3 cm mass in the right chest, a possible neoplasm. Recommend outpatient diagnostic mammography. 5. Bilateral common iliac artery aneurysms, up to 2.7 cm on the right. 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 6. Cholelithiasis.   Electronically Signed   By: Jorje Guild M.D.   On: 11/24/2014 03:39     EKG  Interpretation None      MDM   Final diagnoses:  Abdominal pain, lower  UTI (lower urinary tract infection)   1:58 AM 80 y.o. 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 He notes acute worsening today. He notes the pain is worse when he bears down. He currently has a percutaneous drain in his lower abdomen and a  Foley in place as well. The family also notes a change in the urine color from cloudy yellow to brownish. He denies any fevers or vomiting. He has been tolerating solid and liquid intake. He continues to have watery brown diarrhea. He is afebrile and mildly tachycardic on initial vital signs. We'll get screening labs and CT imaging to assess abscess. Of note, it does not appear he is on any pain medicine at all at home.  6:54 AM: Malposition of foley noted on CT, otherwise no real new findings. Urology already suspicious for fistula and per the notes is arranging an outpt cystogram. I notified the pt of the breast mass which I could easily feel on exam. Recommended f/u w/ pcp for eval of this. We removed his foley and placed a new one given the malposition. He did have some worsening hematuria after this which cleared during his stay w/ flushing. Given the cystitis noted on CT, UA which is nitrite pos, wbc of 18.5, I felt like I should treat for a UTI even though he is already on cipro/flagyl. If there is a fistula to the bladder it could obviously cause the UA to look like this, but given his presentation I felt like I should tx w/ a dose of rocephin and home on keflex w/ recommended close f/u w/ Urology who can decide if the abx should be discontinued given his diarrhea. The pt's pain is controlled and he appears well on exam, he would like to go home. I have discussed the diagnosis/risks/treatment options with the patient and family and believe the pt to be  eligible for discharge home to follow-up with Urology next week. We also discussed returning to the ED immediately if new or worsening sx occur. We discussed the sx which are most concerning (e.g., worsening pain, fever) that necessitate immediate return. Medications administered to the patient during their visit and any new prescriptions provided to the patient are listed below.  Medications given during this visit Medications  iohexol (OMNIPAQUE) 300 MG/ML solution 25 mL (not administered)  sodium chloride 0.9 % bolus 500 mL (0 mLs Intravenous Stopped 11/24/14 0257)  HYDROmorphone (DILAUDID) injection 0.5 mg (0.5 mg Intravenous Given 11/24/14 0231)  iohexol (OMNIPAQUE) 300 MG/ML solution 80 mL (80 mLs Intravenous Contrast Given 11/24/14 0304)  cefTRIAXone (ROCEPHIN) 1 g in dextrose 5 % 50 mL IVPB (0 g Intravenous Stopped 11/24/14 0636)    New Prescriptions   CEPHALEXIN (KEFLEX) 500 MG CAPSULE    Take 1 capsule (500 mg total) by mouth 2 (two) times daily.   OXYCODONE-ACETAMINOPHEN (PERCOCET) 5-325 MG PER TABLET    Take 1 tablet by mouth every 6 (six) hours as needed for moderate pain.      I personally performed the services described in this documentation, which was scribed in my presence. The recorded information has been reviewed and is accurate.      Pamella Pert, MD 11/24/14 [phone]

## 2014-11-25 LAB — URINE CULTURE
CULTURE: NO GROWTH
Colony Count: NO GROWTH

## 2014-12-08 ENCOUNTER — Other Ambulatory Visit (INDEPENDENT_AMBULATORY_CARE_PROVIDER_SITE_OTHER): Payer: Self-pay

## 2014-12-08 DIAGNOSIS — Z4803 Encounter for change or removal of drains: Secondary | ICD-10-CM

## 2014-12-11 ENCOUNTER — Other Ambulatory Visit (HOSPITAL_COMMUNITY): Payer: Self-pay | Admitting: Interventional Radiology

## 2014-12-11 DIAGNOSIS — K750 Abscess of liver: Secondary | ICD-10-CM

## 2014-12-12 ENCOUNTER — Other Ambulatory Visit (INDEPENDENT_AMBULATORY_CARE_PROVIDER_SITE_OTHER): Payer: Self-pay | Admitting: General Surgery

## 2014-12-12 ENCOUNTER — Ambulatory Visit (HOSPITAL_COMMUNITY)
Admission: RE | Admit: 2014-12-12 | Discharge: 2014-12-12 | Disposition: A | Discharge status: Home / Self Care | Payer: Medicare PPO | Source: Ambulatory Visit | Attending: General Surgery | Admitting: General Surgery

## 2014-12-12 DIAGNOSIS — Z4803 Encounter for change or removal of drains: Secondary | ICD-10-CM

## 2014-12-12 DIAGNOSIS — K5780 Diverticulitis of intestine, part unspecified, with perforation and abscess without bleeding: Secondary | ICD-10-CM | POA: Insufficient documentation

## 2014-12-12 MED ORDER — IOHEXOL 300 MG/ML  SOLN
50.0000 mL | Freq: Once | INTRAMUSCULAR | Status: AC | PRN
  Administered 2014-12-12: 10 mL via INTRAVENOUS

## 2014-12-20 ENCOUNTER — Ambulatory Visit (HOSPITAL_COMMUNITY)
Admission: RE | Admit: 2014-12-20 | Discharge: 2014-12-20 | Disposition: A | Discharge status: Home / Self Care | Payer: Medicare PPO | Source: Ambulatory Visit | Attending: General Surgery | Admitting: General Surgery

## 2014-12-20 DIAGNOSIS — K630 Abscess of intestine: Secondary | ICD-10-CM | POA: Diagnosis present

## 2014-12-20 DIAGNOSIS — Z4803 Encounter for change or removal of drains: Secondary | ICD-10-CM

## 2014-12-20 MED ORDER — IOHEXOL 300 MG/ML  SOLN
50.0000 mL | Freq: Once | INTRAMUSCULAR | Status: AC | PRN
  Administered 2014-12-20: 10 mL

## 2015-01-03 ENCOUNTER — Other Ambulatory Visit (INDEPENDENT_AMBULATORY_CARE_PROVIDER_SITE_OTHER): Payer: Self-pay | Admitting: Surgery

## 2015-05-08 ENCOUNTER — Emergency Department (HOSPITAL_COMMUNITY)
Admission: EM | Admit: 2015-05-08 | Discharge: 2015-05-09 | Disposition: A | Discharge status: Home / Self Care | Payer: Medicare PPO | Attending: Emergency Medicine | Admitting: Emergency Medicine

## 2015-05-08 ENCOUNTER — Encounter (HOSPITAL_COMMUNITY): Payer: Self-pay | Admitting: Emergency Medicine

## 2015-05-08 DIAGNOSIS — I10 Essential (primary) hypertension: Secondary | ICD-10-CM | POA: Insufficient documentation

## 2015-05-08 DIAGNOSIS — Z8673 Personal history of transient ischemic attack (TIA), and cerebral infarction without residual deficits: Secondary | ICD-10-CM | POA: Diagnosis not present

## 2015-05-08 DIAGNOSIS — Z87438 Personal history of other diseases of male genital organs: Secondary | ICD-10-CM | POA: Diagnosis not present

## 2015-05-08 DIAGNOSIS — M1990 Unspecified osteoarthritis, unspecified site: Secondary | ICD-10-CM | POA: Insufficient documentation

## 2015-05-08 DIAGNOSIS — Z7982 Long term (current) use of aspirin: Secondary | ICD-10-CM | POA: Insufficient documentation

## 2015-05-08 DIAGNOSIS — Z7902 Long term (current) use of antithrombotics/antiplatelets: Secondary | ICD-10-CM | POA: Insufficient documentation

## 2015-05-08 DIAGNOSIS — L02211 Cutaneous abscess of abdominal wall: Secondary | ICD-10-CM | POA: Diagnosis not present

## 2015-05-08 DIAGNOSIS — Z87891 Personal history of nicotine dependence: Secondary | ICD-10-CM | POA: Insufficient documentation

## 2015-05-08 DIAGNOSIS — L0291 Cutaneous abscess, unspecified: Secondary | ICD-10-CM

## 2015-05-08 DIAGNOSIS — K5900 Constipation, unspecified: Secondary | ICD-10-CM | POA: Diagnosis present

## 2015-05-08 DIAGNOSIS — Z79899 Other long term (current) drug therapy: Secondary | ICD-10-CM | POA: Insufficient documentation

## 2015-05-08 LAB — CBC WITH DIFFERENTIAL/PLATELET
BASOS ABS: 0.1 10*3/uL (ref 0.0–0.1)
Basophils Relative: 1 % (ref 0–1)
EOS PCT: 2 % (ref 0–5)
Eosinophils Absolute: 0.2 10*3/uL (ref 0.0–0.7)
HCT: 42.5 % (ref 39.0–52.0)
Hemoglobin: 14.2 g/dL (ref 13.0–17.0)
LYMPHS PCT: 18 % (ref 12–46)
Lymphs Abs: 1.6 10*3/uL (ref 0.7–4.0)
MCH: 31.6 pg (ref 26.0–34.0)
MCHC: 33.4 g/dL (ref 30.0–36.0)
MCV: 94.7 fL (ref 78.0–100.0)
Monocytes Absolute: 0.6 10*3/uL (ref 0.1–1.0)
Monocytes Relative: 6 % (ref 3–12)
NEUTROS PCT: 73 % (ref 43–77)
Neutro Abs: 6.5 10*3/uL (ref 1.7–7.7)
PLATELETS: 178 10*3/uL (ref 150–400)
RBC: 4.49 MIL/uL (ref 4.22–5.81)
RDW: 13.5 % (ref 11.5–15.5)
WBC: 8.8 10*3/uL (ref 4.0–10.5)

## 2015-05-08 LAB — COMPREHENSIVE METABOLIC PANEL
ALT: 16 U/L — ABNORMAL LOW (ref 17–63)
AST: 21 U/L (ref 15–41)
Albumin: 3.5 g/dL (ref 3.5–5.0)
Alkaline Phosphatase: 98 U/L (ref 38–126)
Anion gap: 10 (ref 5–15)
BUN: 18 mg/dL (ref 6–20)
CALCIUM: 8.6 mg/dL — AB (ref 8.9–10.3)
CO2: 22 mmol/L (ref 22–32)
CREATININE: 2 mg/dL — AB (ref 0.61–1.24)
Chloride: 108 mmol/L (ref 101–111)
GFR, EST AFRICAN AMERICAN: 35 mL/min — AB (ref >60)
GFR, EST NON AFRICAN AMERICAN: 30 mL/min — AB (ref >60)
GLUCOSE: 105 mg/dL — AB (ref 70–99)
Potassium: 4.8 mmol/L (ref 3.5–5.1)
Sodium: 140 mmol/L (ref 135–145)
Total Bilirubin: 0.6 mg/dL (ref 0.3–1.2)
Total Protein: 7.3 g/dL (ref 6.5–8.1)

## 2015-05-08 MED ORDER — IOHEXOL 300 MG/ML  SOLN
25.0000 mL | INTRAMUSCULAR | Status: AC
  Administered 2015-05-08 (×2): 25 mL via ORAL

## 2015-05-08 NOTE — ED Notes (Addendum)
Pt presents with a  Drainage bag that looks to be into abdominal cavity that has not been draining what appears to be feces for 2-3 days. Family and pt is poor historian and cannot tell me why he has bag to [facility]. Fever or chills denied. Complains of belly pain on left side where drain is coming from. No drainage noted around site.

## 2015-05-09 ENCOUNTER — Emergency Department (HOSPITAL_COMMUNITY): Payer: Medicare PPO

## 2015-05-09 ENCOUNTER — Encounter (HOSPITAL_COMMUNITY): Payer: Self-pay

## 2015-05-09 LAB — URINALYSIS, ROUTINE W REFLEX MICROSCOPIC
Bilirubin Urine: NEGATIVE
GLUCOSE, UA: NEGATIVE mg/dL
HGB URINE DIPSTICK: NEGATIVE
KETONES UR: NEGATIVE mg/dL
LEUKOCYTES UA: NEGATIVE
NITRITE: NEGATIVE
PROTEIN: NEGATIVE mg/dL
Specific Gravity, Urine: 1.014 (ref 1.005–1.030)
Urobilinogen, UA: 1 mg/dL (ref 0.0–1.0)
pH: 6.5 (ref 5.0–8.0)

## 2015-05-09 NOTE — Discharge Instructions (Signed)
Call Dr. Rosendo Gros for a follow-up appointment to discuss possibility of catheter removal.

## 2015-05-09 NOTE — ED Notes (Signed)
CT called and informed pt has finished his contrast.

## 2015-05-09 NOTE — ED Notes (Signed)
Report to becky, rn.  Pt care transferred

## 2015-05-09 NOTE — ED Provider Notes (Signed)
CSN: [id number]     Arrival date & time 05/08/15  [id number] History   First MD Initiated Contact with Patient 05/08/15 2211     Chief Complaint  Patient presents with  . Constipation      HPI  Patient presents for evaluation of decreased drainage from a abscess catheter.  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 Declined surgical intervention. Drain placed via interventional radiology via CT guidance. Patient's been home and actually doing well. Family states that there is been minimal output from the drain into the gravity bag over the last 3-4 days. No fevers shakes chills. No vomiting or diarrhea. He is urinating a normal amount.  Past Medical History  Diagnosis Date  . Hypertension   . Hyperlipidemia   . Peripheral vascular disease   . Carotid artery occlusion   . Arthritis   . Dysphagia   . Stroke     Right hemispheric CVA  . BPH (benign prostatic hyperplasia)   . Umbilical hernia    Past Surgical History  Procedure Laterality Date  . Carotid endarterectomy  10/06/2007    Right CEA by Dr. Amedeo Plenty  . Peg tube placement  2008   History reviewed. No pertinent family history. History  Substance Use Topics  . Smoking status: Former Research scientist (life sciences)  . Smokeless tobacco: Not on file  . Alcohol Use: No    Review of Systems  Constitutional: Negative for fever, chills, diaphoresis, appetite change and fatigue.  HENT: Negative for mouth sores, sore throat and trouble swallowing.   Eyes: Negative for visual disturbance.  Respiratory: Negative for cough, chest tightness, shortness of breath and wheezing.   Cardiovascular: Negative for chest pain.  Gastrointestinal: Negative for nausea, vomiting, abdominal pain, diarrhea and abdominal distention.  Endocrine: Negative for polydipsia, polyphagia and polyuria.  Genitourinary: Negative for dysuria, frequency and hematuria.  Musculoskeletal: Negative for gait problem.  Skin: Negative for  color change, pallor and rash.  Neurological: Negative for dizziness, syncope, light-headedness and headaches.  Hematological: Does not bruise/bleed easily.  Psychiatric/Behavioral: Negative for behavioral problems and confusion.      Allergies  Review of patient's allergies indicates no known allergies.  Home Medications   Prior to Admission medications   Medication Sig Start Date End Date Taking? Authorizing Provider  amLODipine-benazepril (LOTREL) 10-20 MG per capsule Take 1 capsule by mouth daily.   Yes Historical Provider, MD  aspirin 81 MG tablet Take 81 mg by mouth daily.   Yes Historical Provider, MD  clopidogrel (PLAVIX) 75 MG tablet Take 75 mg by mouth daily.   Yes Historical Provider, MD  donepezil (ARICEPT) 5 MG tablet Take 5 mg by mouth at bedtime.   Yes Historical Provider, MD  levothyroxine (SYNTHROID, LEVOTHROID) 50 MCG tablet Take 50 mcg by mouth daily before breakfast.   Yes Historical Provider, MD  pantoprazole (PROTONIX) 40 MG tablet Take 40 mg by mouth daily.   Yes Historical Provider, MD  Probiotic Product (ALIGN) 4 MG CAPS Take 1 capsule by mouth daily.   Yes Historical Provider, MD  simvastatin (ZOCOR) 20 MG tablet Take 20 mg by mouth daily.   Yes Historical Provider, MD  cephALEXin (KEFLEX) 500 MG capsule Take 1 capsule (500 mg total) by mouth 2 (two) times daily. Patient not taking: Reported on 05/08/2015 11/24/14   Pamella Pert, MD  ciprofloxacin (CIPRO) 500 MG tablet Take 1 tablet (500 mg total) by mouth 2 (two) times daily. Patient not taking: Reported on 05/08/2015  11/22/14   Charlynne Cousins, MD  metroNIDAZOLE (FLAGYL) 500 MG tablet Take 1 tablet (500 mg total) by mouth every 8 (eight) hours. Patient not taking: Reported on 05/08/2015 11/22/14   Charlynne Cousins, MD  oxyCODONE-acetaminophen (PERCOCET) 5-325 MG per tablet Take 1 tablet by mouth every 6 (six) hours as needed for moderate pain. Patient not taking: Reported on 05/08/2015 11/24/14    Pamella Pert, MD   BP 106/52 mmHg  Pulse 56  Temp(Src) 98.5 F (36.9 C) (Oral)  Resp 18  SpO2 97% Physical Exam  Abdominal:      ED Course  Procedures (including critical care time) Labs Review Labs Reviewed  COMPREHENSIVE METABOLIC PANEL - Abnormal; Notable for the following:    Glucose, Bld 105 (*)    Creatinine, Ser 2.00 (*)    Calcium 8.6 (*)    ALT 16 (*)    GFR calc non Af Amer 30 (*)    GFR calc Af Amer 35 (*)    All other components within normal limits  CBC WITH DIFFERENTIAL/PLATELET  URINALYSIS, ROUTINE W REFLEX MICROSCOPIC    Imaging Review No results found.   EKG Interpretation None      MDM   Final diagnoses:  Abscess    Patient is essentially asymptomatic. His abdomen is not tender to examine. Family was just concerned about the decreased output from his abscess drained which as been in place since November. He is not febrile does not have elevated white blood cell count.  C scan is requested to estimate as to whether there is still an abscess present or not. Things that he could very likely be followed up with his surgeon for further discussion regarding any surgical interventions should his abscess persist. No abscess is noted would recommend he follow up with general surgery regarding the possibility of catheter removal.    Tanna Furry, MD 05/09/15 0040

## 2015-06-14 ENCOUNTER — Other Ambulatory Visit: Payer: Self-pay

## 2015-06-14 DIAGNOSIS — L988 Other specified disorders of the skin and subcutaneous tissue: Secondary | ICD-10-CM

## 2015-06-26 ENCOUNTER — Telehealth (HOSPITAL_COMMUNITY): Payer: Self-pay | Admitting: General Surgery

## 2015-06-26 NOTE — Telephone Encounter (Signed)
Called pt, left VM for him to call to schedule his drain check appt JM

## 2015-07-05 ENCOUNTER — Ambulatory Visit (HOSPITAL_COMMUNITY)
Admission: RE | Admit: 2015-07-05 | Discharge: 2015-07-05 | Disposition: A | Discharge status: Home / Self Care | Payer: Medicare PPO | Source: Ambulatory Visit | Attending: General Surgery | Admitting: General Surgery

## 2015-07-05 DIAGNOSIS — N321 Vesicointestinal fistula: Secondary | ICD-10-CM | POA: Diagnosis not present

## 2015-07-05 DIAGNOSIS — K5792 Diverticulitis of intestine, part unspecified, without perforation or abscess without bleeding: Secondary | ICD-10-CM | POA: Insufficient documentation

## 2015-07-05 MED ORDER — IOHEXOL 300 MG/ML  SOLN
50.0000 mL | Freq: Once | INTRAMUSCULAR | Status: AC | PRN
  Administered 2015-07-05: 8 mL via INTRAVENOUS

## 2015-09-17 ENCOUNTER — Other Ambulatory Visit (HOSPITAL_COMMUNITY): Payer: Self-pay | Admitting: Diagnostic Radiology

## 2015-09-17 ENCOUNTER — Other Ambulatory Visit: Payer: Self-pay | Admitting: General Surgery

## 2015-09-17 DIAGNOSIS — K750 Abscess of liver: Secondary | ICD-10-CM

## 2015-09-18 ENCOUNTER — Inpatient Hospital Stay: Admission: RE | Admit: 2015-09-18 | Payer: Medicare PPO | Source: Ambulatory Visit

## 2015-09-18 ENCOUNTER — Other Ambulatory Visit: Payer: Medicare PPO

## 2015-09-18 ENCOUNTER — Other Ambulatory Visit: Payer: Self-pay | Admitting: Radiology

## 2015-09-18 DIAGNOSIS — L988 Other specified disorders of the skin and subcutaneous tissue: Secondary | ICD-10-CM

## 2015-09-19 ENCOUNTER — Other Ambulatory Visit (HOSPITAL_COMMUNITY)
Admission: RE | Admit: 2015-09-19 | Discharge: 2015-09-19 | Disposition: A | Discharge status: Home / Self Care | Payer: Medicare PPO | Source: Ambulatory Visit | Attending: Interventional Radiology | Admitting: Interventional Radiology

## 2015-09-19 LAB — BUN: BUN: 17 mg/dL (ref 7–25)

## 2015-09-19 LAB — CREATININE WITH EST GFR
Creat: 1.88 mg/dL — ABNORMAL HIGH (ref 0.70–1.11)
GFR, EST AFRICAN AMERICAN: 38 mL/min — AB (ref >=60)
GFR, Est Non African American: 33 mL/min — ABNORMAL LOW (ref >=60)

## 2015-09-27 ENCOUNTER — Other Ambulatory Visit: Payer: Medicare PPO

## 2015-09-27 ENCOUNTER — Other Ambulatory Visit: Payer: Self-pay | Admitting: General Surgery

## 2015-09-27 ENCOUNTER — Other Ambulatory Visit (HOSPITAL_COMMUNITY): Payer: Self-pay | Admitting: Interventional Radiology

## 2015-09-27 ENCOUNTER — Ambulatory Visit
Admission: RE | Admit: 2015-09-27 | Discharge: 2015-09-27 | Disposition: A | Discharge status: Home / Self Care | Payer: Medicare PPO | Source: Ambulatory Visit | Attending: Diagnostic Radiology | Admitting: Diagnostic Radiology

## 2015-09-27 ENCOUNTER — Ambulatory Visit
Admission: RE | Admit: 2015-09-27 | Discharge: 2015-09-27 | Disposition: A | Discharge status: Home / Self Care | Payer: Medicare PPO | Source: Ambulatory Visit | Attending: General Surgery | Admitting: General Surgery

## 2015-09-27 DIAGNOSIS — K750 Abscess of liver: Secondary | ICD-10-CM

## 2015-09-27 DIAGNOSIS — N3080 Other cystitis without hematuria: Secondary | ICD-10-CM

## 2015-09-27 NOTE — Progress Notes (Signed)
Patient ID: Robert Barnett, male   DOB: [birth year], 81 y.o.   MRN: [id number]   Referring Physician(s): Ramirez,A  Chief Complaint: The patient is seen in follow up today s/p percutaneous drainage of a pelvic/perivesical abscess  10/2014  History of present illness: Robert Barnett is an 81 year old black male, patient of Dr. Rosendo Gros, who underwent drainage of a pelvic/perivesical abscess in November 2015. 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 He presents today for follow-up pelvic abscess drain injection. He denies any recent fevers but has had small amount of bloody drainage in bag as well as around catheter insertion site at times. He denies any air or stool in his urine. He is  not on antibiotic therapy. Drain output has been minimal. The catheter is being flushed once daily. He has not had follow-up with Dr. Rosendo Gros or urology recently.   Past Medical History  Diagnosis Date  . Hypertension   . Hyperlipidemia   . Peripheral vascular disease   . Carotid artery occlusion   . Arthritis   . Dysphagia   . Stroke     Right hemispheric CVA  . BPH (benign prostatic hyperplasia)   . Umbilical hernia     Past Surgical History  Procedure Laterality Date  . Carotid endarterectomy  10/06/2007    Right CEA by Dr. Amedeo Plenty  . Peg tube placement  2008    Allergies: Review of patient's allergies indicates no known allergies.  Medications: Prior to Admission medications   Medication Sig Start Date End Date Taking? Authorizing Kathrynne Kulinski  amLODipine-benazepril (LOTREL) 10-20 MG per capsule Take 1 capsule by mouth daily.    Historical Heiress Williamson, MD  aspirin 81 MG tablet Take 81 mg by mouth daily.    Historical Thayer Embleton, MD  cephALEXin (KEFLEX) 500 MG capsule Take 1 capsule (500 mg total) by mouth 2 (two) times daily. Patient not taking: Reported on 05/08/2015 11/24/14   Pamella Pert, MD  ciprofloxacin (CIPRO) 500 MG tablet Take 1  tablet (500 mg total) by mouth 2 (two) times daily. Patient not taking: Reported on 05/08/2015 11/22/14   Charlynne Cousins, MD  clopidogrel (PLAVIX) 75 MG tablet Take 75 mg by mouth daily.    Historical Cherae Marton, MD  donepezil (ARICEPT) 5 MG tablet Take 5 mg by mouth at bedtime.    Historical Darell Saputo, MD  levothyroxine (SYNTHROID, LEVOTHROID) 50 MCG tablet Take 50 mcg by mouth daily before breakfast.    Historical Lola Lofaro, MD  metroNIDAZOLE (FLAGYL) 500 MG tablet Take 1 tablet (500 mg total) by mouth every 8 (eight) hours. Patient not taking: Reported on 05/08/2015 11/22/14   Charlynne Cousins, MD  oxyCODONE-acetaminophen (PERCOCET) 5-325 MG per tablet Take 1 tablet by mouth every 6 (six) hours as needed for moderate pain. Patient not taking: Reported on 05/08/2015 11/24/14   Pamella Pert, MD  pantoprazole (PROTONIX) 40 MG tablet Take 40 mg by mouth daily.    Historical Shellby Schlink, MD  Probiotic Product (ALIGN) 4 MG CAPS Take 1 capsule by mouth daily.    Historical Sederick Jacobsen, MD  simvastatin (ZOCOR) 20 MG tablet Take 20 mg by mouth daily.    Historical Reco Shonk, MD     No family history on file.  Social History   Social History  . Marital Status: Widowed    Spouse Name: N/A  . Number of Children: N/A  . Years of Education: N/A   Social History Main Topics  . Smoking status: Former Research scientist (life sciences)  .  Smokeless tobacco: Not on file  . Alcohol Use: No  . Drug Use: No  . Sexual Activity: Not on file   Other Topics Concern  . Not on file   Social History Narrative     Vital Signs: BP 138/61 mmHg  Pulse 60  Temp(Src) 96.6 F (35.9 C) (Oral)  SpO2 98%  Physical Exam patient is awake, alert. Anterior pelvic drain is intact, insertion site mildly tender. Abdomen soft, drain bag with small amount of turbid beige colored fluid.  Imaging: No results found.  Labs:  CBC:  Recent Labs  11/20/14 0422 11/21/14 0338 11/24/14 0147 05/08/15 1935  WBC 8.3 8.8 18.5* 8.8  HGB 12.2*  12.3* 13.3 14.2  HCT 37.4* 37.2* 40.5 42.5  PLT 140* 145* 192 178    COAGS:  Recent Labs  11/17/14 1621  INR 1.09  APTT 29    BMP:  Recent Labs  11/21/14 0338 11/22/14 0435 11/24/14 0147 05/08/15 1935 09/18/15 0919  NA 142 142 144 140  --   K 4.3 4.0 4.4 4.8  --   CL 109 111 109 108  --   CO2 18* 19 18* 22  --   GLUCOSE 83 83 148* 105*  --   BUN 19 14 15 18 17   CALCIUM 7.9* 7.9* 8.6 8.6*  --   CREATININE 2.00* 1.76* 1.85* 2.00* 1.88*  GFRNONAA 30* 35* 33* 30* 33*  GFRAA 35* 40* 38* 35* 38*    LIVER FUNCTION TESTS:  Recent Labs  11/17/14 1111 11/19/14 0410 11/24/14 0147 05/08/15 1935  BILITOT 0.4 0.3 0.4 0.6  AST 58* 36 70* 21  ALT 27 20 43 16*  ALKPHOS 64 60 58 98  PROT 6.8 6.3 7.4 7.3  ALBUMIN 2.2* 2.1* 2.5* 3.5    Assessment:  Patient status post drainage of a pelvic/perivesical abscess in November 2015. Patient has known colovesical fistula confirmed on prior drain injections. Drain output has been minimal and the patient has been afebrile. Drain injection performed today revealed persistent fistula, but to colon and not bladder. Plan at this time is to continue current drainage catheter and flushing regimen.   Signed: D. Rowe Robert 09/27/2015, 10:56 AM   Please refer to Dr. Earleen Newport attestation of this note for management and plan.

## 2015-11-14 ENCOUNTER — Other Ambulatory Visit (HOSPITAL_COMMUNITY): Payer: Self-pay | Admitting: Internal Medicine

## 2015-11-14 ENCOUNTER — Ambulatory Visit (HOSPITAL_COMMUNITY)
Admission: RE | Admit: 2015-11-14 | Discharge: 2015-11-14 | Disposition: A | Discharge status: Home / Self Care | Payer: Medicare PPO | Source: Ambulatory Visit | Attending: Internal Medicine | Admitting: Internal Medicine

## 2015-11-14 DIAGNOSIS — K8020 Calculus of gallbladder without cholecystitis without obstruction: Secondary | ICD-10-CM | POA: Insufficient documentation

## 2015-11-14 DIAGNOSIS — R918 Other nonspecific abnormal finding of lung field: Secondary | ICD-10-CM | POA: Diagnosis not present

## 2015-11-14 DIAGNOSIS — N6489 Other specified disorders of breast: Secondary | ICD-10-CM | POA: Insufficient documentation

## 2015-11-14 DIAGNOSIS — N281 Cyst of kidney, acquired: Secondary | ICD-10-CM | POA: Insufficient documentation

## 2015-11-14 DIAGNOSIS — I2510 Atherosclerotic heart disease of native coronary artery without angina pectoris: Secondary | ICD-10-CM | POA: Insufficient documentation

## 2015-11-26 ENCOUNTER — Other Ambulatory Visit (HOSPITAL_COMMUNITY): Payer: Self-pay | Admitting: Internal Medicine

## 2015-11-26 DIAGNOSIS — IMO0002 Reserved for concepts with insufficient information to code with codable children: Secondary | ICD-10-CM

## 2015-11-26 DIAGNOSIS — R229 Localized swelling, mass and lump, unspecified: Principal | ICD-10-CM

## 2015-12-11 ENCOUNTER — Ambulatory Visit (HOSPITAL_COMMUNITY)
Admission: RE | Admit: 2015-12-11 | Discharge: 2015-12-11 | Disposition: A | Discharge status: Home / Self Care | Payer: Medicare PPO | Source: Ambulatory Visit | Attending: Internal Medicine | Admitting: Internal Medicine

## 2015-12-11 ENCOUNTER — Other Ambulatory Visit (HOSPITAL_COMMUNITY): Payer: Self-pay | Admitting: Internal Medicine

## 2015-12-11 DIAGNOSIS — N63 Unspecified lump in breast: Secondary | ICD-10-CM | POA: Insufficient documentation

## 2015-12-11 DIAGNOSIS — N6310 Unspecified lump in the right breast, unspecified quadrant: Secondary | ICD-10-CM

## 2015-12-11 DIAGNOSIS — R229 Localized swelling, mass and lump, unspecified: Secondary | ICD-10-CM

## 2015-12-11 DIAGNOSIS — IMO0002 Reserved for concepts with insufficient information to code with codable children: Secondary | ICD-10-CM

## 2015-12-18 ENCOUNTER — Other Ambulatory Visit (HOSPITAL_COMMUNITY): Payer: Self-pay | Admitting: Internal Medicine

## 2015-12-18 ENCOUNTER — Ambulatory Visit (HOSPITAL_COMMUNITY)
Admission: RE | Admit: 2015-12-18 | Discharge: 2015-12-18 | Disposition: A | Discharge status: Home / Self Care | Payer: Medicare PPO | Source: Ambulatory Visit | Attending: Internal Medicine | Admitting: Internal Medicine

## 2015-12-18 ENCOUNTER — Encounter (HOSPITAL_COMMUNITY): Payer: Self-pay

## 2015-12-18 DIAGNOSIS — N6310 Unspecified lump in the right breast, unspecified quadrant: Secondary | ICD-10-CM

## 2015-12-18 DIAGNOSIS — N63 Unspecified lump in breast: Secondary | ICD-10-CM | POA: Insufficient documentation

## 2015-12-18 LAB — CBC
HCT: 43.9 % (ref 39.0–52.0)
Hemoglobin: 14.6 g/dL (ref 13.0–17.0)
MCH: 32.2 pg (ref 26.0–34.0)
MCHC: 33.3 g/dL (ref 30.0–36.0)
MCV: 96.9 fL (ref 78.0–100.0)
PLATELETS: 190 10*3/uL (ref 150–400)
RBC: 4.53 MIL/uL (ref 4.22–5.81)
RDW: 13.5 % (ref 11.5–15.5)
WBC: 7.7 10*3/uL (ref 4.0–10.5)

## 2015-12-18 LAB — PROTIME-INR
INR: 1.02 (ref 0.00–1.49)
Prothrombin Time: 13.6 seconds (ref 11.6–15.2)

## 2015-12-18 MED ORDER — LIDOCAINE-EPINEPHRINE (PF) 1 %-1:200000 IJ SOLN
INTRAMUSCULAR | Status: AC
  Filled 2015-12-18: qty 10

## 2015-12-18 MED ORDER — LIDOCAINE HCL (PF) 1 % IJ SOLN
INTRAMUSCULAR | Status: AC
  Filled 2015-12-18: qty 10

## 2015-12-18 NOTE — Discharge Instructions (Signed)
Breast Biopsy, Care After These instructions give you information on caring for yourself after your procedure. Your doctor may also give you more specific instructions. Call your doctor if you have any problems or questions after your procedure. HOME CARE  Only take medicine as told by your doctor.  Do not take aspirin.  Keep your sutures (stitches) dry when bathing.  Protect the biopsy area. Do not let the area get bumped.  Avoid activities that could pull the biopsy site open until your doctor approves. This includes:  Stretching.  Reaching.  Exercise.  Sports.  Lifting more than 3lb.  Continue your normal diet.  Wear a good support bra for as long as told by your doctor.  Change any bandages (dressings) as told by your doctor.  Do not drink alcohol while taking pain medicine.  Keep all doctor visits as told. Ask when your test results will be ready. Make sure you get your test results. GET HELP RIGHT AWAY IF:   You have a fever.  You have more bleeding (more than a small spot) from the biopsy site.  You have trouble breathing.  You have yellowish-white fluid (pus) coming from the biopsy site.  You have redness, puffiness (swelling), or more pain in the biopsy site.  You have a bad smell coming from the biopsy site.  Your biopsy site opens after sutures, staples, or sticky strips have been removed.  You have a rash.  You need stronger medicine. MAKE SURE YOU:  Understand these instructions.  Will watch your condition.  Will get help right away if you are not doing well or get worse.   This information is not intended to replace advice given to you by your health care provider. Make sure you discuss any questions you have with your health care provider.   Document Released: 10/11/2009 Document Revised: 01/05/2015 Document Reviewed: 01/25/2012 Elsevier Interactive Patient Education 2016 [employer].  Breast Biopsy A breast biopsy is a test during  which a sample of tissue is taken from your breast. The breast tissue is looked at under a microscope for cancer cells.  BEFORE THE PROCEDURE  Make plans to have someone drive you home after the test.  Do not smoke for 2 weeks before the test. Stop smoking, if you smoke.  Do not drink alcohol for 24 hours before the test.  Wear a good support bra to the test.  Your doctor may do a procedure to place a wire or a seed that gives off radiation in the breast lump. The wire or seed will help your doctor see the lump when doing the biopsy. PROCEDURE  You may be given one of the following:  A medicine to numb the breast area (local anesthetic).  A medicine to make you fall asleep (general anesthetic). There are different types of breast biopsies. They include:  Fine-needle aspiration.  A needle is put into the breast lump.  The needle takes out fluid and cells from the lump.  Ultrasound imaging may be used to help find the lump and to put the needle in the right spot.  Core-needle biopsy.  A needle is put into the breast lump.  The needle is put in your breast 3-6 times.  The needle removes breast tissue.  An ultrasound image or X-ray is often used to find the right spot to put in the needle.  Stereotactic biopsy.  X-rays and a computer are used to study X-ray pictures of the breast lump.  The computer finds  where the needle needs to be put into the breast.  Tissue samples are taken out.  Vacuum-assisted biopsy.  A small cut (incision) is made in your breast.  A biopsy device is put through the cut and into the breast tissue.  The biopsy device draws abnormal breast tissue into the biopsy device.  A large tissue sample is often removed.  No stitches are needed.  Ultrasound-guided core-needle biopsy.  Ultrasound imaging helps guide the needle into the area of the breast that is not normal.  A cut is made in the breast. The needle is put into the breast  lump.  Tissue samples are taken out.  Open biopsy.  A large cut is made in the breast.  Your doctor will try to remove the whole breast lump or as much as possible. All tissue, fluid, or cell samples are looked at under a microscope.  AFTER THE PROCEDURE  You will be taken to an area to recover. You will be able to go home once you are doing well and are without problems.  You may have bruising on your breast. This is normal.  A pressure bandage (dressing) may be put on your breast for 24-48 hours. This type of bandage is wrapped tightly around your chest. It helps stop fluid from building up underneath tissues.   This information is not intended to replace advice given to you by your health care provider. Make sure you discuss any questions you have with your health care provider.   Document Released: 03/08/2012 Document Revised: 09/05/2015 Document Reviewed: 03/08/2012 Elsevier Interactive Patient Education [employer].

## 2016-02-07 IMAGING — CT CT ABD-PELV W/O CM
2 of 4 series · 15 of 46 positions shown, 17 images · non-contrast
Comparison: 05/05/2005

CLINICAL DATA: Subsequent encounter for abdominal pain and
diarrhea. Pain in the right lower quadrant and difficulty urinating.

EXAM:
CT ABDOMEN AND PELVIS WITHOUT CONTRAST
TECHNIQUE: Multidetector CT imaging of the abdomen and pelvis was performed
following the standard protocol without IV contrast.

[Series 2: abd/ pelvis 5.0 i30f 1 · axial · 0.94mm/px · z∈[-564,-109]mm · 12 of 101 slices shown, 14 images]
[im 5/101  soft-tissue]
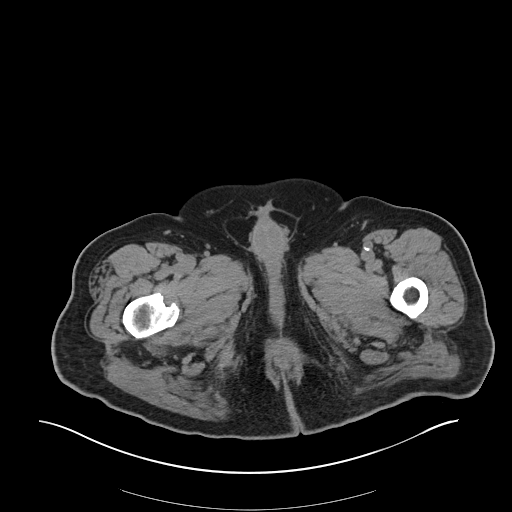
[im 5/101  bone]
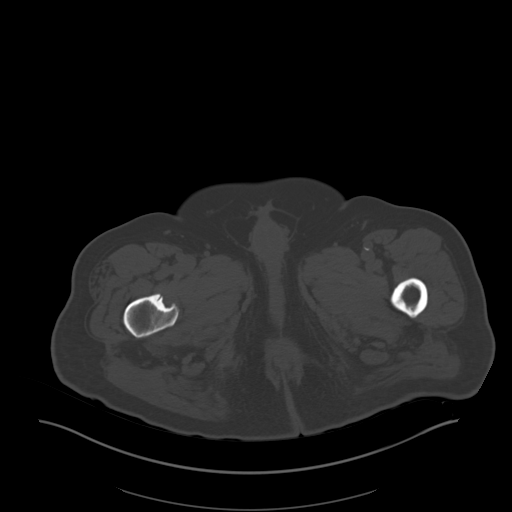
[im 14/101  soft-tissue]
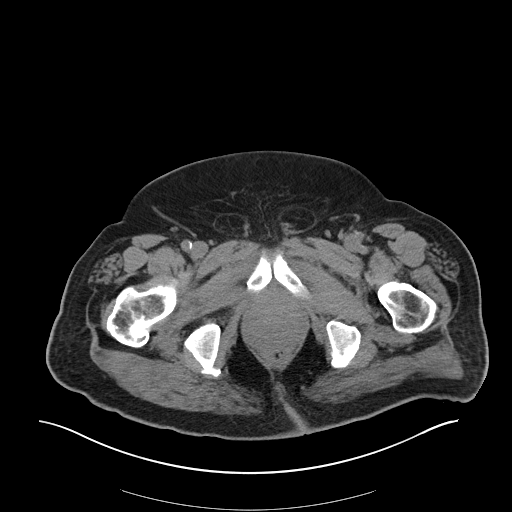
[im 22/101  soft-tissue]
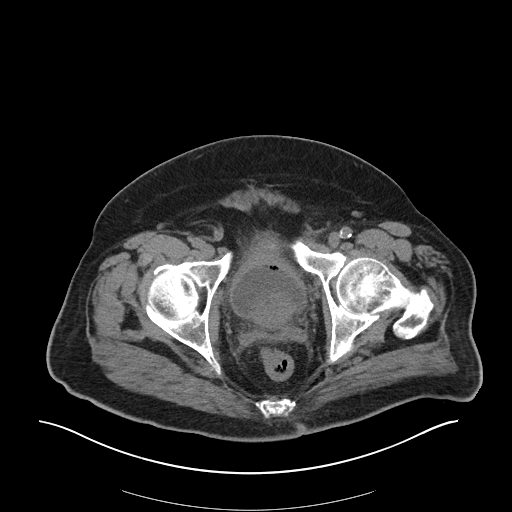
[im 31/101  soft-tissue]
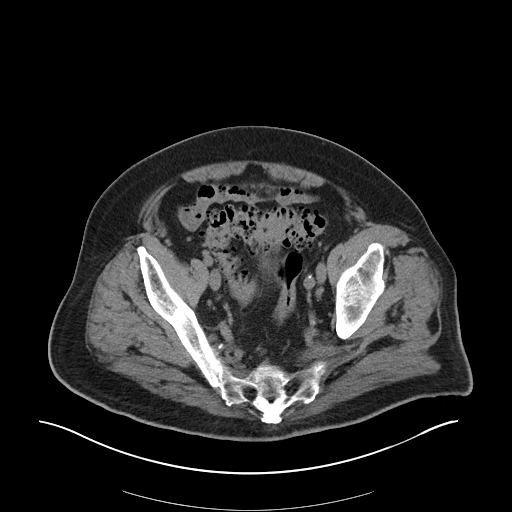
[im 40/101  soft-tissue]
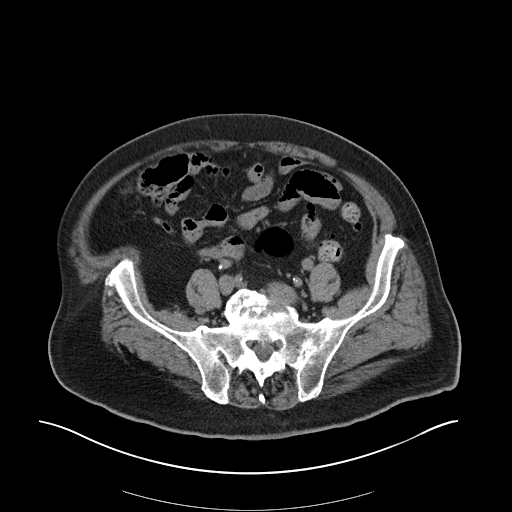
[im 48/101  soft-tissue]
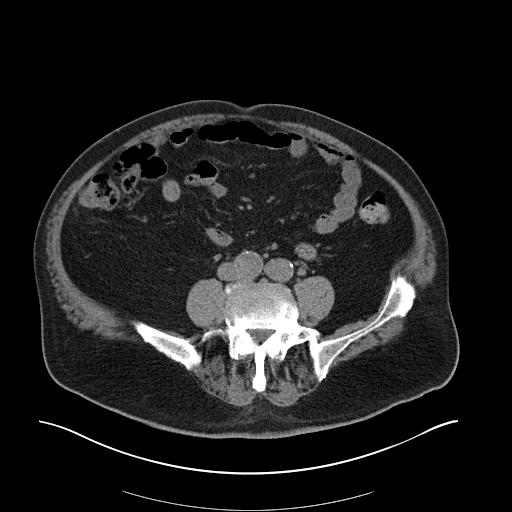
[im 53/101  soft-tissue]
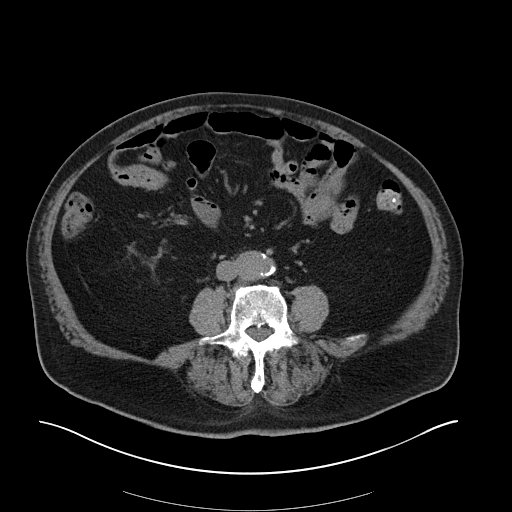
[im 61/101  soft-tissue]
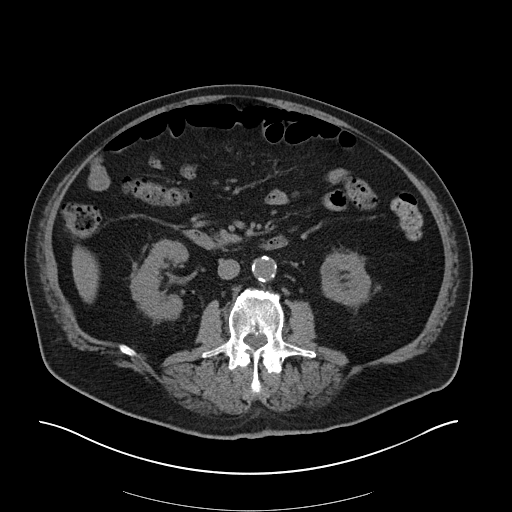
[im 70/101  soft-tissue]
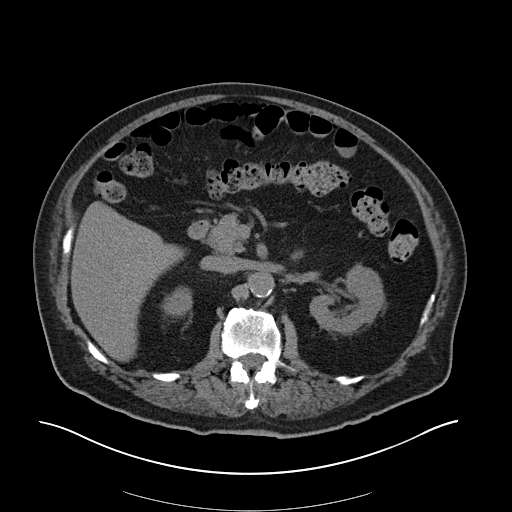
[im 70/101  bone]
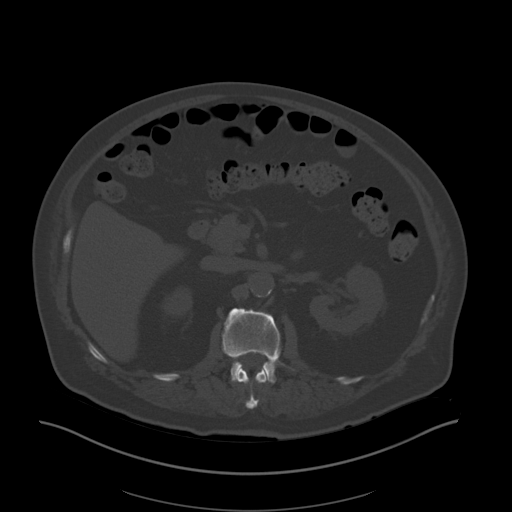
[im 79/101  soft-tissue]
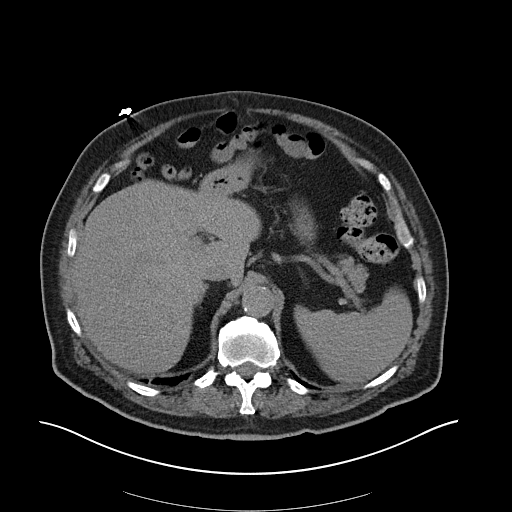
[im 87/101  soft-tissue]
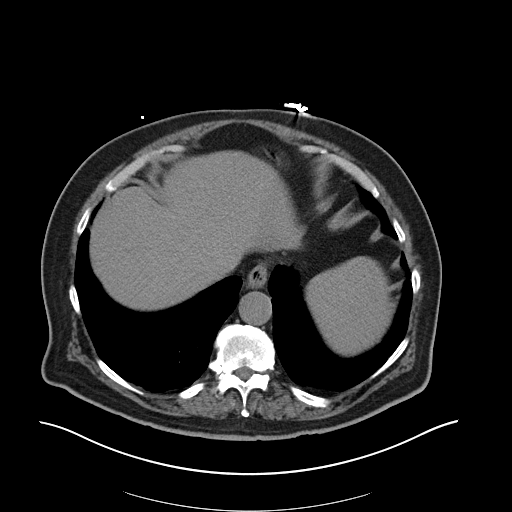
[im 96/101  soft-tissue]
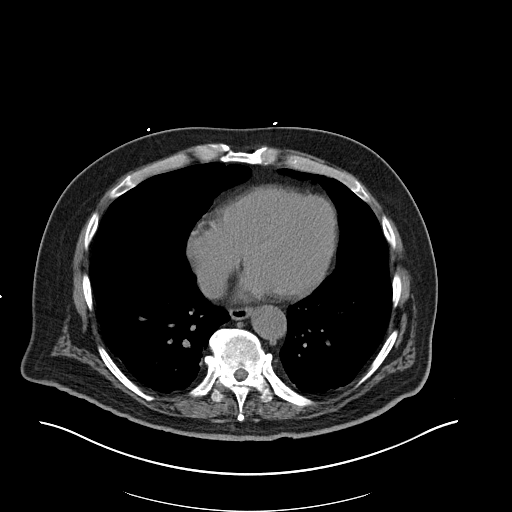

[Series 5: cor st · coronal · 0.90mm/px · 3 of 107 slices shown]
[im 36/107  soft-tissue]
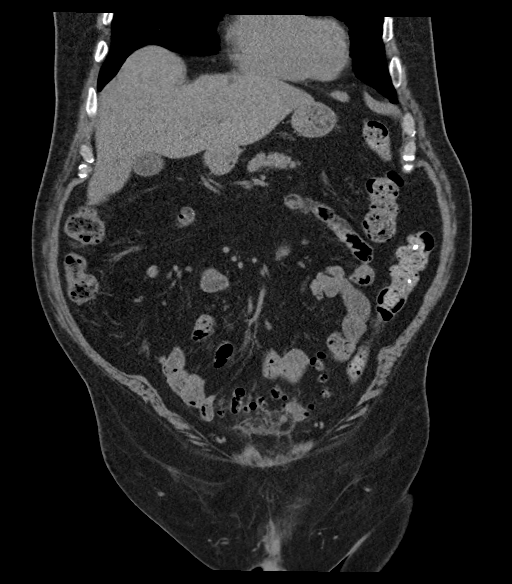
[im 48/107  soft-tissue]
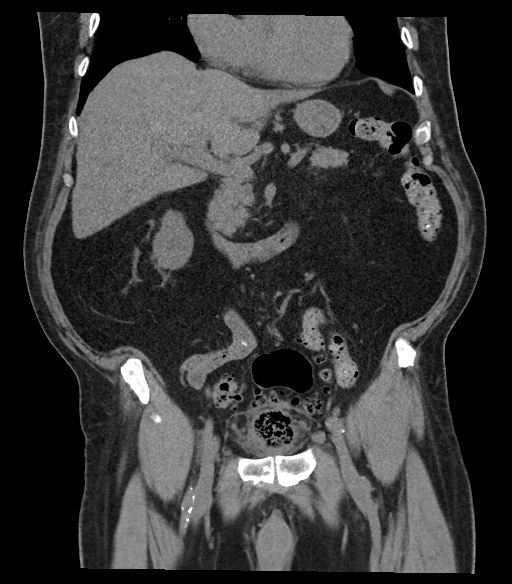
[im 59/107  soft-tissue]
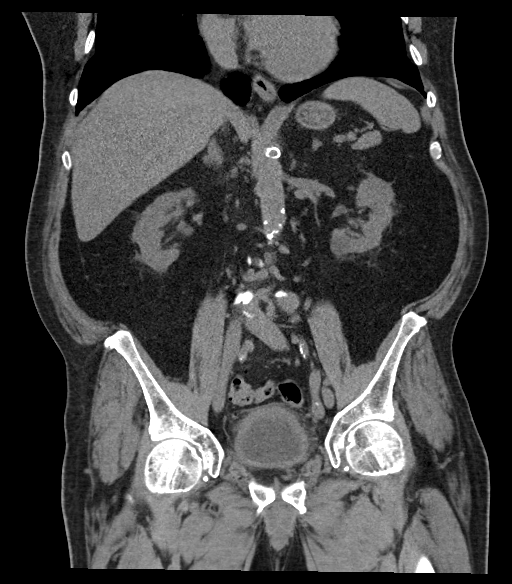

[15 of 46 positions shown; findings below may reference images not displayed]

FINDINGS: Lower chest: Compressive atelectasis noted in the dependent lung
bases bilaterally.

Hepatobiliary: 9 mm low-density lesion in the dome of the left liver
is probably a cyst. Liver with otherwise unremarkable on infused
appearance. Multiple calcified gallstones measure up to 7 mm in
diameter. No intrahepatic or extrahepatic biliary dilation.

Pancreas: No focal mass lesion. No dilatation of the main duct. No
intraparenchymal cyst. No peripancreatic edema.

Spleen: No splenomegaly. No focal mass lesion.

Adrenals/Urinary Tract: No adrenal nodule or mass. 2.5 cm water
density lesion in the interpolar right kidney is likely a cyst.
cm water density lesion in the lower pole the left kidney is also
likely a cyst. No evidence for stone disease in either kidney. No
evidence for hydroureter or ureteral stone disease. Bladder is
mildly distended and shows circumferential bladder wall thickening.
Associated with the anterior bladder dome is marked wall thickening
with end immediately contiguous 4.3 x 3.8 x 3.8 cm collection of
debris and gas. This collection appears tether tube both in the dome
of the bladder in the mid sigmoid colon. Surrounding edema/
inflammation is evident. There is a small amount a gas visible in
the lumen of the urinary bladder.

Stomach/Bowel: Stomach is nondistended. No gastric wall thickening.
No evidence of outlet obstruction. Duodenum is normally positioned
as is the ligament of Treitz. No small bowel wall thickening. No
small bowel dilatation. Terminal ileum and appendix are normal.
Advanced diverticulosis is noted in the sigmoid colon. Please see
adrenal urinary tract section above.

Vascular/Lymphatic: Atherosclerotic calcification is noted in the
wall of the abdominal aorta without aneurysm. Right common iliac
artery measures 2.6 cm in maximum diameter. Left common iliac artery
measures up to 2.3 cm in diameter.

No gastrohepatic ligament or hepatoduodenal ligament
lymphadenopathy. No retroperitoneal lymphadenopathy. No evidence for
pelvic sidewall lymphadenopathy.

Reproductive: Prostate gland is enlarged.

Other: No intraperitoneal free fluid. Bilateral inguinal hernias
contain only fat.

Musculoskeletal: Mild degenerative changes are seen in the hips
bilaterally. No worrisome lytic or sclerotic osseous abnormality.
IMPRESSION: 4.3 cm abscess involving the bladder wall at the dome also appears
tethered to the mid transverse colon which shows associated wall
thickening. There is some surrounding edema/ inflammation and gas is
visible in the urinary bladder. The presence of gas in the bladder
is highly suggestive of colovesical fistula, likely secondary to
previous diverticulitis.

Probable bilateral renal cysts.

2.6 right common iliac artery aneurysm. Left common iliac artery
measures 2.3 cm in diameter.

Insert cholelithiasis.

## 2016-07-02 ENCOUNTER — Other Ambulatory Visit: Payer: Self-pay | Admitting: General Surgery

## 2016-07-02 DIAGNOSIS — N3080 Other cystitis without hematuria: Secondary | ICD-10-CM

## 2016-07-02 DIAGNOSIS — K651 Peritoneal abscess: Secondary | ICD-10-CM

## 2016-07-08 ENCOUNTER — Telehealth: Payer: Self-pay | Admitting: General Practice

## 2016-07-08 NOTE — Telephone Encounter (Signed)
Daughter would like to know if Dr. Ronnald Ramp would take patient on?  States patient would like a male MD.

## 2016-07-09 ENCOUNTER — Ambulatory Visit
Admission: RE | Admit: 2016-07-09 | Discharge: 2016-07-09 | Disposition: A | Discharge status: Home / Self Care | Payer: Medicare Other | Source: Ambulatory Visit | Attending: General Surgery | Admitting: General Surgery

## 2016-07-09 ENCOUNTER — Other Ambulatory Visit: Payer: Self-pay | Admitting: General Surgery

## 2016-07-09 DIAGNOSIS — K651 Peritoneal abscess: Secondary | ICD-10-CM

## 2016-07-09 DIAGNOSIS — N3080 Other cystitis without hematuria: Secondary | ICD-10-CM

## 2016-07-09 HISTORY — PX: IR GENERIC HISTORICAL: IMG1180011

## 2016-07-09 NOTE — Progress Notes (Signed)
Patient ID: Robert Barnett, male   DOB: Jan 18, 1932, 82 y.o.   MRN: [id number]   Referring Physician(s): Ramirez,Armando  Chief Complaint: The patient is seen in follow up today s/p percutaneous abdominal drain placement on 11-18-14, by Dr. Kathlene Cote  History of present illness:  This is an 82 yo male who was found to have diverticulitis with an abscess in November of 2015.  He did not want surgery and a drain was placed in his abscess.  The patient is followed by Dr. Rosendo Gros.  He last saw [place] in drain clinic in September of 2016 where he had a drain injection that shows a persistent fistulous connection from the drain to the sigmoid colon.  He has since been lost in follow up to [place].  Dr. Rosendo Gros saw him and has recommended a repeat CT scan and a drain injection.  The patient presents today for this evaluation.  Past Medical History  Diagnosis Date  . Hypertension   . Hyperlipidemia   . Peripheral vascular disease ([place])   . Carotid artery occlusion   . Arthritis   . Dysphagia   . Stroke [facility])     Right hemispheric CVA  . BPH (benign prostatic hyperplasia)   . Umbilical hernia     Past Surgical History  Procedure Laterality Date  . Carotid endarterectomy  10/06/2007    Right CEA by Dr. Amedeo Plenty  . Peg tube placement  2008    Allergies: Review of patient's allergies indicates no known allergies.  Medications: Prior to Admission medications   Medication Sig Start Date End Date Taking? Authorizing Provider  amLODipine-benazepril (LOTREL) 10-20 MG per capsule Take 1 capsule by mouth daily.    Historical Provider, MD  aspirin 81 MG tablet Take 81 mg by mouth daily.    Historical Provider, MD  cephALEXin (KEFLEX) 500 MG capsule Take 1 capsule (500 mg total) by mouth 2 (two) times daily. Patient not taking: Reported on 05/08/2015 11/24/14   Pamella Pert, MD  ciprofloxacin (CIPRO) 500 MG tablet Take 1 tablet (500 mg total) by mouth 2 (two) times daily. Patient not taking: Reported on  05/08/2015 11/22/14   Charlynne Cousins, MD  clopidogrel (PLAVIX) 75 MG tablet Take 75 mg by mouth daily.    Historical Provider, MD  donepezil (ARICEPT) 5 MG tablet Take 5 mg by mouth at bedtime.    Historical Provider, MD  levothyroxine (SYNTHROID, LEVOTHROID) 50 MCG tablet Take 50 mcg by mouth daily before breakfast.    Historical Provider, MD  metroNIDAZOLE (FLAGYL) 500 MG tablet Take 1 tablet (500 mg total) by mouth every 8 (eight) hours. Patient not taking: Reported on 05/08/2015 11/22/14   Charlynne Cousins, MD  oxyCODONE-acetaminophen (PERCOCET) 5-325 MG per tablet Take 1 tablet by mouth every 6 (six) hours as needed for moderate pain. Patient not taking: Reported on 05/08/2015 11/24/14   Pamella Pert, MD  pantoprazole (PROTONIX) 40 MG tablet Take 40 mg by mouth daily.    Historical Provider, MD  Probiotic Product (ALIGN) 4 MG CAPS Take 1 capsule by mouth daily.    Historical Provider, MD  simvastatin (ZOCOR) 20 MG tablet Take 20 mg by mouth daily.    Historical Provider, MD     No family history on file.  Social History   Social History  . Marital Status: Widowed    Spouse Name: N/A  . Number of Children: N/A  . Years of Education: N/A   Social History Main Topics  . Smoking status: Former Research scientist (life sciences)  .  Smokeless tobacco: Not on file  . Alcohol Use: No  . Drug Use: No  . Sexual Activity: Not on file   Other Topics Concern  . Not on file   Social History Narrative     Vital Signs: BP 149/69 mmHg  Pulse 48  Temp(Src) 97.6 F (36.4 C)  SpO2 100%  Physical Exam  Gen: older, WD, WN black male who is in NAD Abd: soft, NT, ND, Drain mostly pulled out.  Drain injection reveals no fistulas, therefore, his drain was removed.  No complications.  A dry dressing was placed.  Imaging: No results found.  Labs:  CBC:  Recent Labs  12/18/15 1105  WBC 7.7  HGB 14.6  HCT 43.9  PLT 190    COAGS:  Recent Labs  12/18/15 1105  INR 1.02    BMP:  Recent  Labs  09/18/15 0919  BUN 17  CREATININE 1.88*  GFRNONAA 33*  GFRAA 38*    LIVER FUNCTION TESTS: No results for input(s): BILITOT, AST, ALT, ALKPHOS, PROT, ALBUMIN in the last 8760 hours.  Assessment:  1. Diverticulitis with abscess and fistulous connection to sigmoid colon  The patient is doing well.  He has no abdominal pain or fevers.  He has not had recently flare ups of his diverticulitis.  His CT scan reveals an area of extraluminal air, but no discrete abscess.  His drain has pulled out into his subcutaneous tissues.  We performed a drain injection.  The contrast flushed out of the drain onto his skin.  There was no evidence of a persistent fistulous connection to his drain.  Therefore, his drain was removed.  He will follow up with Dr. Rosendo Gros for further plan of care.  Signed: Henreitta Cea 07/09/2016, 12:35 PM   Please refer to Dr. Adron Bene attestation of this note for management and plan.

## 2016-07-09 NOTE — Telephone Encounter (Signed)
Ok with me 

## 2016-07-10 NOTE — Telephone Encounter (Signed)
Got scheduled  °

## 2016-07-23 ENCOUNTER — Ambulatory Visit (INDEPENDENT_AMBULATORY_CARE_PROVIDER_SITE_OTHER): Payer: Medicare Other | Admitting: Internal Medicine

## 2016-07-23 ENCOUNTER — Encounter: Payer: Self-pay | Admitting: Internal Medicine

## 2016-07-23 ENCOUNTER — Other Ambulatory Visit (INDEPENDENT_AMBULATORY_CARE_PROVIDER_SITE_OTHER): Payer: Medicare Other

## 2016-07-23 VITALS — BP 130/58 | HR 64 | Temp 98.3°F | Resp 16 | Ht 70.0 in | Wt 230.3 lb

## 2016-07-23 DIAGNOSIS — R739 Hyperglycemia, unspecified: Secondary | ICD-10-CM

## 2016-07-23 DIAGNOSIS — F0390 Unspecified dementia without behavioral disturbance: Secondary | ICD-10-CM

## 2016-07-23 DIAGNOSIS — E039 Hypothyroidism, unspecified: Secondary | ICD-10-CM | POA: Diagnosis not present

## 2016-07-23 DIAGNOSIS — E785 Hyperlipidemia, unspecified: Secondary | ICD-10-CM

## 2016-07-23 DIAGNOSIS — F03B0 Unspecified dementia, moderate, without behavioral disturbance, psychotic disturbance, mood disturbance, and anxiety: Secondary | ICD-10-CM | POA: Insufficient documentation

## 2016-07-23 DIAGNOSIS — K632 Fistula of intestine: Secondary | ICD-10-CM

## 2016-07-23 LAB — COMPREHENSIVE METABOLIC PANEL
ALBUMIN: 3.9 g/dL (ref 3.5–5.2)
ALK PHOS: 85 U/L (ref 39–117)
ALT: 8 U/L (ref 0–53)
AST: 13 U/L (ref 0–37)
BUN: 20 mg/dL (ref 6–23)
CO2: 27 meq/L (ref 19–32)
Calcium: 9.3 mg/dL (ref 8.4–10.5)
Chloride: 109 mEq/L (ref 96–112)
Creatinine, Ser: 2.28 mg/dL — ABNORMAL HIGH (ref 0.40–1.50)
GFR: 35.55 mL/min — ABNORMAL LOW (ref >60.00)
GLUCOSE: 99 mg/dL (ref 70–99)
POTASSIUM: 4.9 meq/L (ref 3.5–5.1)
SODIUM: 141 meq/L (ref 135–145)
TOTAL PROTEIN: 8 g/dL (ref 6.0–8.3)
Total Bilirubin: 0.4 mg/dL (ref 0.2–1.2)

## 2016-07-23 LAB — CBC WITH DIFFERENTIAL/PLATELET
BASOS ABS: 0 10*3/uL (ref 0.0–0.1)
Basophils Relative: 0.4 % (ref 0.0–3.0)
Eosinophils Absolute: 0.1 10*3/uL (ref 0.0–0.7)
Eosinophils Relative: 1.3 % (ref 0.0–5.0)
HEMATOCRIT: 43.1 % (ref 39.0–52.0)
HEMOGLOBIN: 14.3 g/dL (ref 13.0–17.0)
LYMPHS PCT: 15.1 % (ref 12.0–46.0)
Lymphs Abs: 1.5 10*3/uL (ref 0.7–4.0)
MCHC: 33.2 g/dL (ref 30.0–36.0)
MCV: 95.5 fl (ref 78.0–100.0)
MONOS PCT: 9.9 % (ref 3.0–12.0)
Monocytes Absolute: 1 10*3/uL (ref 0.1–1.0)
NEUTROS ABS: 7.1 10*3/uL (ref 1.4–7.7)
Neutrophils Relative %: 73.3 % (ref 43.0–77.0)
PLATELETS: 177 10*3/uL (ref 150.0–400.0)
RBC: 4.51 Mil/uL (ref 4.22–5.81)
RDW: 14.1 % (ref 11.5–15.5)
WBC: 9.8 10*3/uL (ref 4.0–10.5)

## 2016-07-23 LAB — LIPID PANEL
CHOL/HDL RATIO: 4
CHOLESTEROL: 145 mg/dL (ref 0–200)
HDL: 40.3 mg/dL (ref >39.00)
LDL Cholesterol: 81 mg/dL (ref 0–99)
NonHDL: 104.4
TRIGLYCERIDES: 119 mg/dL (ref 0.0–149.0)
VLDL: 23.8 mg/dL (ref 0.0–40.0)

## 2016-07-23 LAB — HEMOGLOBIN A1C: HEMOGLOBIN A1C: 5.6 % (ref 4.6–6.5)

## 2016-07-23 LAB — TSH: TSH: 3.17 u[IU]/mL (ref 0.35–4.50)

## 2016-07-23 NOTE — Patient Instructions (Signed)

## 2016-07-23 NOTE — Progress Notes (Signed)
Pre visit review using our clinic review tool, if applicable. No additional management support is needed unless otherwise documented below in the visit note. 

## 2016-07-23 NOTE — Progress Notes (Signed)
Subjective:  Patient ID: Robert Barnett, male    DOB: 07/26/1932  Age: 82 y.o. MRN: [id number]  CC: Hypothyroidism   NEW TO ME  HPI Robert Barnett presents for est with a new PCP. He is a very poor historian due to dementia, he is with his daughter today and she doesn't appear to be of much help either. She complains that he has diarrhea but he tells me he only has bowel movements about 2 or 3 times a day and he is not concerned about it. She has noticed that over the last year he has developed stool incontinence. He has had a complicated history recently with what appears to be a abscess in the abdomen near the sigmoid colon that was recently treated with a drain. He has a dressing over the left lower abdomen that she says she last changed a week ago. I asked her if he has any surgical follow-up regarding this and she said she doesn't know. 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 He has taken Aricept for about 6 months and there has been no improvement in his cognitive skills. She also complains that he urinates frequently. She said she has not noticed any urinary incontinence. He does have a prior history of a CVA.   Past Medical History:  Diagnosis Date  . Arthritis   . BPH (benign prostatic hyperplasia)   . Carotid artery occlusion   . Dysphagia   . Hyperlipidemia   . Hypertension   . Peripheral vascular disease ([place])   . Stroke [facility])    Right hemispheric CVA  . Umbilical hernia    Past Surgical History:  Procedure Laterality Date  . CAROTID ENDARTERECTOMY  10/06/2007   Right CEA by Dr. Amedeo Plenty  . PEG TUBE PLACEMENT  2008    reports that he has quit smoking. He does not have any smokeless tobacco history on file. He reports that he does not drink alcohol or use drugs. family history is not on file. No Known Allergies  Outpatient Medications Prior to Visit  Medication Sig Dispense Refill  . aspirin 81 MG tablet Take 81 mg by mouth daily.    . clopidogrel (PLAVIX) 75 MG  tablet Take 75 mg by mouth daily.    Marland Kitchen donepezil (ARICEPT) 5 MG tablet Take 5 mg by mouth at bedtime.    Marland Kitchen levothyroxine (SYNTHROID, LEVOTHROID) 50 MCG tablet Take 50 mcg by mouth daily before breakfast.    . simvastatin (ZOCOR) 20 MG tablet Take 20 mg by mouth daily.    Marland Kitchen amLODipine-benazepril (LOTREL) 10-20 MG per capsule Take 1 capsule by mouth daily.    . cephALEXin (KEFLEX) 500 MG capsule Take 1 capsule (500 mg total) by mouth 2 (two) times daily. 14 capsule 0  . ciprofloxacin (CIPRO) 500 MG tablet Take 1 tablet (500 mg total) by mouth 2 (two) times daily. 14 tablet 0  . metroNIDAZOLE (FLAGYL) 500 MG tablet Take 1 tablet (500 mg total) by mouth every 8 (eight) hours. 42 tablet 0  . oxyCODONE-acetaminophen (PERCOCET) 5-325 MG per tablet Take 1 tablet by mouth every 6 (six) hours as needed for moderate pain. 25 tablet 0  . pantoprazole (PROTONIX) 40 MG tablet Take 40 mg by mouth daily.    . Probiotic Product (ALIGN) 4 MG CAPS Take 1 capsule by mouth daily.     No facility-administered medications prior to visit.     ROS Review of Systems  Constitutional: Negative.  Negative for activity change,  appetite change, chills, fatigue and fever.  HENT: Negative.   Eyes: Negative.   Respiratory: Negative for cough, chest tightness, shortness of breath and stridor.   Cardiovascular: Negative.  Negative for chest pain, palpitations and leg swelling.  Gastrointestinal: Positive for diarrhea. Negative for constipation, nausea and vomiting.  Genitourinary: Positive for frequency. Negative for difficulty urinating.  Musculoskeletal: Negative.  Negative for back pain and myalgias.  Skin: Negative.  Negative for color change and rash.  Allergic/Immunologic: Negative.   Neurological: Negative.  Negative for dizziness, weakness, light-headedness and headaches.  Psychiatric/Behavioral: Positive for confusion and decreased concentration. Negative for agitation, behavioral problems, sleep disturbance and  suicidal ideas. The patient is not nervous/anxious.     Objective:  BP (!) 130/58 (BP Location: Left Arm, Patient Position: Sitting, Cuff Size: Normal)   Pulse 64   Temp 98.3 F (36.8 C) (Oral)   Resp 16   Ht 5\' 10"  (1.778 m)   Wt 230 lb 5 oz (104.5 kg)   SpO2 97%   BMI 33.05 kg/m   BP Readings from Last 3 Encounters:  07/23/16 (!) 130/58  07/09/16 (!) 149/69  12/18/15 (!) 145/63    Wt Readings from Last 3 Encounters:  07/23/16 230 lb 5 oz (104.5 kg)  11/21/14 234 lb 12.8 oz (106.5 kg)    Physical Exam  Constitutional: He is oriented to person, place, and time. No distress.  HENT:  Mouth/Throat: Oropharynx is clear and moist. No oropharyngeal exudate.  Eyes: Conjunctivae are normal. Right eye exhibits no discharge. Left eye exhibits no discharge. No scleral icterus.  Neck: Normal range of motion. Neck supple. No JVD present. No tracheal deviation present. No thyromegaly present.  Cardiovascular: Normal rate, regular rhythm, normal heart sounds and intact distal pulses.  Exam reveals no gallop and no friction rub.   No murmur heard. Pulmonary/Chest: Effort normal and breath sounds normal. No stridor. No respiratory distress. He has no wheezes. He has no rales. He exhibits no tenderness.  Abdominal: Soft. Normal appearance and bowel sounds are normal. He exhibits no distension, no ascites and no mass. There is no tenderness. There is no rigidity, no rebound and no guarding.    Musculoskeletal: Normal range of motion. He exhibits no edema, tenderness or deformity.  Lymphadenopathy:    He has no cervical adenopathy.  Neurological: He is oriented to person, place, and time.  Skin: Skin is warm and dry. No rash noted. He is not diaphoretic. No erythema. No pallor.  Psychiatric: Judgment and thought content normal. His mood appears not anxious. His speech is delayed and tangential. His speech is not slurred. He is slowed and withdrawn. He is not agitated, not aggressive, not  hyperactive, not actively hallucinating and not combative. Cognition and memory are impaired. He does not exhibit a depressed mood. He is noncommunicative. He exhibits abnormal recent memory and abnormal remote memory. He is inattentive.  Vitals reviewed.   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 Previous drain injection 09/27/2015 showed persistent fistula to proximal sigmoid colon. EXAM: CT ABDOMEN AND PELVIS WITHOUT CONTRAST TECHNIQUE: Multidetector CT imaging of the abdomen and pelvis was performed following the standard protocol without IV contrast. Elevated creatinine, low GFR. COMPARISON:  11/14/2015 and earlier studies FINDINGS: Lower chest: Scattered coronary calcifications. Dependent atelectasis posteriorly in the visualized lung bases. 4.4 cm right breast mass, stable since 11/14/2015. No pleural or pericardial effusion. Hepatobiliary: Multiple partially calcified stones measure up to 1 cm in the dependent aspect of the nondilated gallbladder lumen. No intrahepatic biliary ductal dilatation. Sub cm low-attenuation segment 4A lesion stable. No new liver lesion evident. Pancreas: No mass or inflammatory process identified on this un-enhanced exam. Spleen: Within normal limits in size. Adrenals/Urinary Tract: No evidence of urolithiasis or hydronephrosis. No  definite mass visualized on this un-enhanced exam. 3 cm probable cyst from the interpolar region right kidney stable. Mild wall thickening of the urinary bladder, incompletely distended. Stomach/Bowel: Stomach, small bowel, and colon are decompressed. Normal appendix. Multiple distal descending and sigmoid diverticula without significant adjacent inflammatory/ edematous change. Vascular/Lymphatic: No adenopathy localized. Scattered aortoiliac arterial calcifications. Fusiform dilatation of the distal infrarenal aorta up to a diameter 4.1 cm just proximal to its bifurcation, with fusiform dilatation of the distal left common iliac artery up to 2.3 cm, in the proximal right common iliac artery up to a 2.9 cm diameter. Reproductive: Moderate prostatic enlargement. Other: No ascites.  No free air. The left pelvic drain catheter has partially retracted, now lying just superficial to the abdominal wall fascia in the subcutaneous tissues. There is a soft tissue attenuation tract through the rectus musculature, with a 13 mm multiloculated gas collection at the site of the original catheter placement interposed between the mid sigmoid colon in dome of the urinary bladder. No residual undrained fluid collection. No gas in the lumen of the urinary bladder. Musculoskeletal: degenerative spurring throughout the visualized distal thoracic and lumbar spine. No fracture. Normal alignment. IMPRESSION: 1. Abscess drain catheter has retracted into the subcutaneous tissues. There is a 13 mm multiloculated gas collection at the site of the previous abscess, with no residual or recurrent undrained fluid collection. Patient is scheduled for catheter injection today for further evaluation. 2. 4.1 cm distal abdominal aortic aneurysm with extension into bilateral common iliac arteries as above. Recommend followup by ultrasound in 1 year. This recommendation follows ACR consensus guidelines: White Paper of the ACR Incidental Findings  Committee II on Vascular Findings. J Am Coll Radiol 2013; 10:789-794. 3. Atherosclerosis, including Aortic Atherosclerosis (ICD10-170.0) and coronary artery disease. Please note that although the presence of coronary artery calcium documents the presence of coronary artery disease, the severity of this disease and any potential stenosis cannot be assessed on this non-gated CT examination. Assessment for potential risk factor modification, dietary therapy or pharmacologic therapy may be warranted, if clinically indicated. 4. Cholelithiasis Electronically Signed   By: Lucrezia Europe M.D.   On: 07/09/2016 13:33   Dg Sinus/fist Tube Chk-non Gi  Result Date: 07/09/2016 CLINICAL DATA:  Abscess of perivesicular tissue of urinary bladder/pelvic abscess in male, post percutaneous drainage 11/18/2014. CT from earlier today showed that the drain catheter has retracted into the subcutaneous tissues. EXAM: PELVIC ABSCESS DRAIN CATHETER INJECTION UNDER FLUOROSCOPY TECHNIQUE: Survey fluoroscopic inspection reveals pigtail drain catheter  projecting the soft tissues just superior to the left superior ischiopubic ramus. Injection demonstrates no communication to the lumen of the sigmoid colon nor to the small extraluminal gas collection seen inferior to the sigmoid colon adjacent to the dome of the urinary bladder on recent CT. The injected contrast tracks back along the catheter tubing and exits at the skin entry site. IMPRESSION: 1. Malpositioned abscess drain catheter with no communication to abscess site or sigmoid colon. The catheter will be removed. Electronically Signed   By: Lucrezia Europe M.D.   On: 07/09/2016 13:35    Assessment & Plan:   Ferry was seen today for hypothyroidism.  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 -     Lipid panel; Future -     CBC with Differential/Platelet; Future -     TSH; Future  Dementia, senile, without behavioral disturbance- I have offered a referral to neurology to consider additional treatment options for his dementia but his daughter indicates that she is not interested in that. -     Ambulatory referral to Neurology   I have discontinued Mr. Cowper's amLODipine-benazepril, pantoprazole, ALIGN, ciprofloxacin, metroNIDAZOLE, cephALEXin, and oxyCODONE-acetaminophen. I am also having him maintain his clopidogrel, aspirin, levothyroxine, donepezil, simvastatin, amLODipine, and benazepril.  Meds ordered this encounter  Medications  . amLODipine (NORVASC) 10 MG tablet  . benazepril (LOTENSIN) 40 MG tablet     Follow-up: Return in about 3 months (around 10/23/2016).  Scarlette Calico, MD

## 2016-08-18 ENCOUNTER — Ambulatory Visit (INDEPENDENT_AMBULATORY_CARE_PROVIDER_SITE_OTHER)
Admission: RE | Admit: 2016-08-18 | Discharge: 2016-08-18 | Disposition: A | Discharge status: Home / Self Care | Payer: Medicare Other | Source: Ambulatory Visit | Attending: Internal Medicine | Admitting: Internal Medicine

## 2016-08-18 ENCOUNTER — Encounter: Payer: Self-pay | Admitting: Internal Medicine

## 2016-08-18 ENCOUNTER — Ambulatory Visit (INDEPENDENT_AMBULATORY_CARE_PROVIDER_SITE_OTHER): Payer: Medicare Other | Admitting: Internal Medicine

## 2016-08-18 VITALS — BP 160/60 | HR 49 | Temp 98.1°F | Resp 16 | Ht 70.0 in | Wt 233.0 lb

## 2016-08-18 DIAGNOSIS — I700 Atherosclerosis of aorta: Secondary | ICD-10-CM | POA: Diagnosis not present

## 2016-08-18 DIAGNOSIS — Z23 Encounter for immunization: Secondary | ICD-10-CM | POA: Diagnosis not present

## 2016-08-18 DIAGNOSIS — M545 Low back pain, unspecified: Secondary | ICD-10-CM

## 2016-08-18 MED ORDER — HYDROCODONE-ACETAMINOPHEN 5-325 MG PO TABS: 1.0000 | ORAL_TABLET | Freq: Four times a day (QID) | ORAL | 0 refills | Status: DC | PRN

## 2016-08-18 NOTE — Progress Notes (Signed)
Subjective:  Patient ID: Robert Barnett, male    DOB: 12-24-1932  Age: 82 y.o. MRN: [id number]  CC: Back Pain   HPI Jaevian Serino Fleishman presents for a 3 week hx of non-traumatic LBP. He describes the pain as an intermittent achy sensation that does not radiate into his lower extremities. He has gotten some symptom relief with ibuprofen. He denies paresthesias, abdominal pain, fever, chills, loss of appetite, nausea, or vomiting. He does have a rash over his back but it's a large plaque of psoriasis that is been there for a very long time.  Outpatient Medications Prior to Visit  Medication Sig Dispense Refill  . amLODipine (NORVASC) 10 MG tablet     . aspirin 81 MG tablet Take 81 mg by mouth daily.    . benazepril (LOTENSIN) 40 MG tablet     . clopidogrel (PLAVIX) 75 MG tablet Take 75 mg by mouth daily.    Marland Kitchen donepezil (ARICEPT) 5 MG tablet Take 5 mg by mouth at bedtime.    Marland Kitchen levothyroxine (SYNTHROID, LEVOTHROID) 50 MCG tablet Take 50 mcg by mouth daily before breakfast.    . simvastatin (ZOCOR) 20 MG tablet Take 20 mg by mouth daily.     No facility-administered medications prior to visit.     ROS Review of Systems  Constitutional: Negative.  Negative for activity change, appetite change, chills, fatigue, fever and unexpected weight change.  HENT: Negative.   Eyes: Negative.   Respiratory: Negative.  Negative for cough, choking, chest tightness, shortness of breath and stridor.   Cardiovascular: Negative.  Negative for chest pain, palpitations and leg swelling.  Gastrointestinal: Negative for abdominal pain, constipation, diarrhea and nausea.  Endocrine: Negative.   Genitourinary: Negative.   Musculoskeletal: Positive for back pain. Negative for arthralgias, joint swelling, myalgias and neck pain.  Skin: Positive for rash. Negative for color change.  Allergic/Immunologic: Negative.   Neurological: Negative.   Hematological: Negative.  Negative for adenopathy. Does not bruise/bleed  easily.  Psychiatric/Behavioral: Negative.     Objective:  BP (!) 160/60 (BP Location: Left Arm, Patient Position: Sitting, Cuff Size: Normal)   Pulse (!) 49   Temp 98.1 F (36.7 C) (Oral)   Resp 16   Ht 5\' 10"  (1.778 m)   Wt 233 lb (105.7 kg)   SpO2 96%   BMI 33.43 kg/m   BP Readings from Last 3 Encounters:  08/18/16 (!) 160/60  07/23/16 (!) 130/58  07/09/16 (!) 149/69    Wt Readings from Last 3 Encounters:  08/18/16 233 lb (105.7 kg)  07/23/16 230 lb 5 oz (104.5 kg)  11/21/14 234 lb 12.8 oz (106.5 kg)    Physical Exam  Constitutional: He is oriented to person, place, and time. No distress.  HENT:  Mouth/Throat: Oropharynx is clear and moist. No oropharyngeal exudate.  Eyes: Conjunctivae are normal. Right eye exhibits no discharge. Left eye exhibits no discharge. No scleral icterus.  Neck: Normal range of motion. Neck supple. No JVD present. No tracheal deviation present. No thyromegaly present.  Cardiovascular: Normal rate, regular rhythm, normal heart sounds and intact distal pulses.  Exam reveals no gallop and no friction rub.   No murmur heard. Pulmonary/Chest: Effort normal and breath sounds normal. No stridor. No respiratory distress. He has no wheezes. He has no rales. He exhibits no tenderness.  Abdominal: Soft. Bowel sounds are normal. He exhibits no distension and no mass. There is no tenderness. There is no rebound and no guarding.  Musculoskeletal: He exhibits no  edema, tenderness or deformity.       Lumbar back: Normal. He exhibits normal range of motion, no tenderness, no bony tenderness, no swelling, no edema, no deformity, no laceration, no pain, no spasm and normal pulse.       Back:  Lymphadenopathy:    He has no cervical adenopathy.  Neurological: He is alert and oriented to person, place, and time. He has normal strength. He displays no atrophy, no tremor and normal reflexes. No cranial nerve deficit or sensory deficit. He exhibits normal muscle tone.  He displays a negative Romberg sign. He displays no seizure activity. Coordination and gait normal.  Reflex Scores:      Tricep reflexes are 1+ on the right side and 1+ on the left side.      Bicep reflexes are 1+ on the right side and 1+ on the left side.      Brachioradialis reflexes are 1+ on the right side and 1+ on the left side.      Patellar reflexes are 1+ on the right side and 1+ on the left side.      Achilles reflexes are 1+ on the right side and 1+ on the left side. NEG SLR in BLE  Skin: Skin is warm and dry. No rash noted. He is not diaphoretic. No erythema. No pallor.  Vitals reviewed.   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 Previous drain injection 09/27/2015 showed persistent fistula to proximal sigmoid colon. EXAM: CT ABDOMEN AND PELVIS WITHOUT CONTRAST TECHNIQUE: Multidetector CT imaging of the abdomen and pelvis was performed following the standard protocol without IV contrast. Elevated creatinine, low GFR. COMPARISON:  11/14/2015 and earlier studies FINDINGS: Lower chest: Scattered coronary calcifications. Dependent atelectasis posteriorly in the visualized lung bases. 4.4 cm right breast mass, stable since 11/14/2015. No pleural or pericardial effusion. Hepatobiliary: Multiple partially calcified stones measure up to 1 cm in the dependent  aspect of the nondilated gallbladder lumen. No intrahepatic biliary ductal dilatation. Sub cm low-attenuation segment 4A lesion stable. No new liver lesion evident. Pancreas: No mass or inflammatory process identified on this un-enhanced exam. Spleen: Within normal limits in size. Adrenals/Urinary Tract: No evidence of urolithiasis or hydronephrosis. No definite mass visualized on this un-enhanced exam. 3 cm probable cyst from the interpolar region right kidney stable. Mild wall thickening of the urinary bladder, incompletely distended. Stomach/Bowel: Stomach, small bowel, and colon are decompressed. Normal appendix. Multiple distal descending and sigmoid diverticula without significant adjacent inflammatory/ edematous change. Vascular/Lymphatic: No adenopathy localized. Scattered aortoiliac arterial calcifications. Fusiform dilatation of the distal infrarenal aorta up to a diameter 4.1 cm just proximal to its bifurcation, with fusiform dilatation of the distal left common iliac artery up to 2.3 cm, in the proximal right common  iliac artery up to a 2.9 cm diameter. Reproductive: Moderate prostatic enlargement. Other: No ascites.  No free air. The left pelvic drain catheter has partially retracted, now lying just superficial to the abdominal wall fascia in the subcutaneous tissues. There is a soft tissue attenuation tract through the rectus musculature, with a 13 mm multiloculated gas collection at the site of the original catheter placement interposed between the mid sigmoid colon in dome of the urinary bladder. No residual undrained fluid collection. No gas in the lumen of the urinary bladder. Musculoskeletal: degenerative spurring throughout the visualized distal thoracic and lumbar spine. No fracture. Normal alignment. IMPRESSION: 1. Abscess drain catheter has retracted into the subcutaneous tissues. There is a 13 mm multiloculated gas collection at the site of the previous abscess, with no residual or recurrent  undrained fluid collection. Patient is scheduled for catheter injection today for further evaluation. 2. 4.1 cm distal abdominal aortic aneurysm with extension into bilateral common iliac arteries as above. Recommend followup by ultrasound in 1 year. This recommendation follows ACR consensus guidelines: White Paper of the ACR Incidental Findings Committee II on Vascular Findings. J Am Coll Radiol 2013; 10:789-794. 3. Atherosclerosis, including Aortic Atherosclerosis (ICD10-170.0) and coronary artery disease. Please note that although the presence of coronary artery calcium documents the presence of coronary artery disease, the severity of this disease and any potential stenosis cannot be assessed on this non-gated CT examination. Assessment for potential risk factor modification, dietary therapy or pharmacologic therapy may be warranted, if clinically indicated. 4. Cholelithiasis Electronically Signed   By: Lucrezia Europe M.D.   On: 07/09/2016 13:33   Dg Sinus/fist Tube Chk-non Gi  Result Date: 07/09/2016 CLINICAL DATA:  Abscess of perivesicular tissue of urinary bladder/pelvic abscess in male, post percutaneous drainage 11/18/2014. CT from earlier today showed that the drain catheter has retracted into the subcutaneous tissues. EXAM: PELVIC ABSCESS DRAIN CATHETER INJECTION UNDER FLUOROSCOPY TECHNIQUE: Survey fluoroscopic inspection reveals pigtail drain catheter projecting the soft tissues just superior to the left superior ischiopubic ramus. Injection demonstrates no communication to the lumen of the sigmoid colon nor to the small extraluminal gas collection seen inferior to the sigmoid colon adjacent to the dome of the urinary bladder on recent CT. The injected contrast tracks back along the catheter tubing and exits at the skin entry site. IMPRESSION: 1. Malpositioned abscess drain catheter with no communication to abscess site or sigmoid colon. The catheter will be removed. Electronically Signed   By: Lucrezia Europe M.D.   On: 07/09/2016 13:35    Assessment & Plan:   Riaz was seen today for back pain.  Diagnoses and all orders for this visit:  Bilateral low back pain without sciatica- his symptoms and exam do not raise any alarm features, his plain films only show some spurring consistent with degenerative disc disease, will continue ibuprofen as needed and will also add on Norco for additional symptom relief. IF this does not resolve in the next 6-8 weeks then I will consider additional diagnostic tests such as an MRI. -     HYDROcodone-acetaminophen (NORCO/VICODIN) 5-325 MG tablet; Take 1 tablet by mouth every 6 (six) hours as needed for moderate pain. -     DG Lumbar Spine Complete; Future  Need for prophylactic vaccination and inoculation against influenza -     Flu Vaccine QUAD 36+ mos IM  Abdominal aortic atherosclerosis ([place])- this was seen on today's x-ray, will continue aspirin therapy, the ACE inhibitor, and statin therapy to reduce complications  I am having Mr. Janusz start on HYDROcodone-acetaminophen. I am also having him maintain his clopidogrel, aspirin, levothyroxine, donepezil, simvastatin, amLODipine, benazepril, pantoprazole, and tamsulosin.  Meds ordered this encounter  Medications  . pantoprazole (PROTONIX) 40 MG tablet  . tamsulosin (FLOMAX) 0.4 MG CAPS capsule  . HYDROcodone-acetaminophen (NORCO/VICODIN) 5-325 MG tablet    Sig: Take 1 tablet by mouth every 6 (six) hours as needed for moderate pain.    Dispense:  65 tablet    Refill:  0     Follow-up: Return in about 6 weeks (around 09/29/2016).  Scarlette Calico, MD

## 2016-08-18 NOTE — Patient Instructions (Signed)

## 2016-08-18 NOTE — Progress Notes (Signed)
Pre visit review using our clinic review tool, if applicable. No additional management support is needed unless otherwise documented below in the visit note. 

## 2016-08-19 ENCOUNTER — Encounter: Payer: Self-pay | Admitting: Internal Medicine

## 2016-08-19 DIAGNOSIS — I700 Atherosclerosis of aorta: Secondary | ICD-10-CM | POA: Insufficient documentation

## 2016-08-26 ENCOUNTER — Telehealth: Payer: Self-pay

## 2016-08-26 NOTE — Telephone Encounter (Signed)
-----   Message from Allegra Lai sent at 08/26/2016  4:27 PM EDT ----- Please call patient's daughter, Carlyon Shadow) about referral to [facility]       Dx  Abdominal Fistula

## 2016-08-26 NOTE — Telephone Encounter (Signed)
LVM for pt to call back as soon as possible.    RE: abdominal fistula - per OV on 07/11/2016 this was discussed. Surgical abdominal opening was referred to surgery to close the opening.

## 2016-08-27 NOTE — Telephone Encounter (Signed)
Darlene called request to speak to the assistant (only). Please call her back at [phone]

## 2016-08-27 NOTE — Telephone Encounter (Signed)
Spoke to [employer]. She wants and appt with Dr Rosendo Gros at [place].   Can this be set up?

## 2016-09-26 ENCOUNTER — Encounter: Payer: Self-pay | Admitting: Interventional Radiology

## 2016-09-28 ENCOUNTER — Other Ambulatory Visit: Payer: Self-pay | Admitting: Internal Medicine

## 2016-10-09 ENCOUNTER — Encounter: Payer: Self-pay | Admitting: Neurology

## 2016-10-09 ENCOUNTER — Ambulatory Visit (INDEPENDENT_AMBULATORY_CARE_PROVIDER_SITE_OTHER): Payer: Medicare Other | Admitting: Neurology

## 2016-10-09 VITALS — BP 130/72 | HR 53 | Temp 98.4°F | Ht 70.0 in | Wt 232.1 lb

## 2016-10-09 DIAGNOSIS — F0390 Unspecified dementia without behavioral disturbance: Secondary | ICD-10-CM | POA: Diagnosis not present

## 2016-10-09 DIAGNOSIS — F03B0 Unspecified dementia, moderate, without behavioral disturbance, psychotic disturbance, mood disturbance, and anxiety: Secondary | ICD-10-CM

## 2016-10-09 MED ORDER — DONEPEZIL HCL 10 MG PO TABS: 10.0000 mg | ORAL_TABLET | Freq: Every day | ORAL | 3 refills | Status: DC
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
## 2016-10-09 NOTE — Patient Instructions (Signed)
1. Increase Donepezil, take 10mg  tablet daily 2. Continue home supervision and home safety precautions 3. Follow-up in 6 months

## 2016-11-02 ENCOUNTER — Other Ambulatory Visit: Payer: Self-pay | Admitting: Internal Medicine

## 2016-11-05 ENCOUNTER — Other Ambulatory Visit: Payer: Self-pay | Admitting: Internal Medicine

## 2016-11-05 DIAGNOSIS — E039 Hypothyroidism, unspecified: Secondary | ICD-10-CM

## 2016-11-05 MED ORDER — LEVOTHYROXINE SODIUM 50 MCG PO TABS: 50.0000 ug | ORAL_TABLET | Freq: Every day | ORAL | 1 refills | Status: DC

## 2017-03-06 ENCOUNTER — Other Ambulatory Visit: Payer: Self-pay | Admitting: Internal Medicine

## 2017-03-16 ENCOUNTER — Telehealth: Payer: Self-pay

## 2017-03-16 ENCOUNTER — Other Ambulatory Visit: Payer: Self-pay | Admitting: Internal Medicine

## 2017-03-16 DIAGNOSIS — I6300 Cerebral infarction due to thrombosis of unspecified precerebral artery: Secondary | ICD-10-CM

## 2017-03-16 MED ORDER — CLOPIDOGREL BISULFATE 75 MG PO TABS: 75.0000 mg | ORAL_TABLET | Freq: Every day | ORAL | 1 refills | Status: DC

## 2017-03-16 NOTE — Telephone Encounter (Signed)
RX sent

## 2017-03-16 NOTE — Telephone Encounter (Signed)
[facility] requesting refill for clopidogrel 75 mg.

## 2017-03-17 NOTE — Telephone Encounter (Signed)
Left detailed message for pt.  RE: rx has been sent to pharmacy. Cal if he has any questions.

## 2017-03-29 ENCOUNTER — Other Ambulatory Visit: Payer: Self-pay | Admitting: Internal Medicine

## 2017-04-29 ENCOUNTER — Ambulatory Visit (INDEPENDENT_AMBULATORY_CARE_PROVIDER_SITE_OTHER): Payer: Medicare Other | Admitting: Neurology

## 2017-04-29 ENCOUNTER — Encounter: Payer: Self-pay | Admitting: Neurology

## 2017-04-29 VITALS — BP 122/58 | HR 72 | Temp 98.4°F | Wt 239.0 lb

## 2017-04-29 DIAGNOSIS — F0390 Unspecified dementia without behavioral disturbance: Secondary | ICD-10-CM | POA: Diagnosis not present

## 2017-04-29 DIAGNOSIS — F03B0 Unspecified dementia, moderate, without behavioral disturbance, psychotic disturbance, mood disturbance, and anxiety: Secondary | ICD-10-CM

## 2017-04-29 MED ORDER — DONEPEZIL HCL 10 MG PO TABS
10.0000 mg | ORAL_TABLET | Freq: Every day | ORAL | 3 refills | Status: DC
Start: 2017-04-29 — End: 2018-05-05
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
## 2017-04-29 NOTE — Patient Instructions (Signed)
1. Continue Aricept 10mg  daily 2. Continue to monitor home safety and hygiene to help prevent infections/falls. At a certain point he will need 24/7 supervision 3. Follow-up in 1 year, call for any changes

## 2017-05-29 ENCOUNTER — Other Ambulatory Visit: Payer: Self-pay | Admitting: Internal Medicine

## 2017-05-29 DIAGNOSIS — E039 Hypothyroidism, unspecified: Secondary | ICD-10-CM

## 2017-07-30 ENCOUNTER — Other Ambulatory Visit (INDEPENDENT_AMBULATORY_CARE_PROVIDER_SITE_OTHER): Payer: Medicare Other

## 2017-07-30 ENCOUNTER — Encounter: Payer: Self-pay | Admitting: Internal Medicine

## 2017-07-30 ENCOUNTER — Ambulatory Visit (INDEPENDENT_AMBULATORY_CARE_PROVIDER_SITE_OTHER): Payer: Medicare Other | Admitting: Internal Medicine

## 2017-07-30 VITALS — BP 98/54 | HR 64 | Temp 98.6°F | Resp 16 | Ht 70.0 in | Wt 235.5 lb

## 2017-07-30 DIAGNOSIS — I10 Essential (primary) hypertension: Secondary | ICD-10-CM

## 2017-07-30 DIAGNOSIS — M10071 Idiopathic gout, right ankle and foot: Secondary | ICD-10-CM | POA: Insufficient documentation

## 2017-07-30 DIAGNOSIS — I700 Atherosclerosis of aorta: Secondary | ICD-10-CM

## 2017-07-30 DIAGNOSIS — E032 Hypothyroidism due to medicaments and other exogenous substances: Secondary | ICD-10-CM

## 2017-07-30 LAB — CBC WITH DIFFERENTIAL/PLATELET
BASOS ABS: 0.1 10*3/uL (ref 0.0–0.1)
Basophils Relative: 1 % (ref 0.0–3.0)
EOS PCT: 0.9 % (ref 0.0–5.0)
Eosinophils Absolute: 0.1 10*3/uL (ref 0.0–0.7)
HEMATOCRIT: 38.2 % — AB (ref 39.0–52.0)
Hemoglobin: 12.2 g/dL — ABNORMAL LOW (ref 13.0–17.0)
LYMPHS PCT: 18.3 % (ref 12.0–46.0)
Lymphs Abs: 1.4 10*3/uL (ref 0.7–4.0)
MCHC: 32.1 g/dL (ref 30.0–36.0)
MCV: 97.4 fl (ref 78.0–100.0)
MONOS PCT: 9.7 % (ref 3.0–12.0)
Monocytes Absolute: 0.7 10*3/uL (ref 0.1–1.0)
Neutro Abs: 5.3 10*3/uL (ref 1.4–7.7)
Neutrophils Relative %: 70.1 % (ref 43.0–77.0)
Platelets: 249 10*3/uL (ref 150.0–400.0)
RBC: 3.92 Mil/uL — AB (ref 4.22–5.81)
RDW: 13.9 % (ref 11.5–15.5)
WBC: 7.5 10*3/uL (ref 4.0–10.5)

## 2017-07-30 LAB — SEDIMENTATION RATE: SED RATE: 99 mm/h — AB (ref 0–20)

## 2017-07-30 LAB — LIPID PANEL
CHOL/HDL RATIO: 4
Cholesterol: 113 mg/dL (ref 0–200)
HDL: 30.6 mg/dL — AB (ref >39.00)
LDL Cholesterol: 63 mg/dL (ref 0–99)
NONHDL: 81.93
TRIGLYCERIDES: 95 mg/dL (ref 0.0–149.0)
VLDL: 19 mg/dL (ref 0.0–40.0)

## 2017-07-30 LAB — URIC ACID: Uric Acid, Serum: 9.8 mg/dL — ABNORMAL HIGH (ref 4.0–7.8)

## 2017-07-30 LAB — COMPREHENSIVE METABOLIC PANEL
ALK PHOS: 65 U/L (ref 39–117)
ALT: 9 U/L (ref 0–53)
AST: 15 U/L (ref 0–37)
Albumin: 3.4 g/dL — ABNORMAL LOW (ref 3.5–5.2)
BILIRUBIN TOTAL: 0.5 mg/dL (ref 0.2–1.2)
BUN: 43 mg/dL — ABNORMAL HIGH (ref 6–23)
CALCIUM: 8.7 mg/dL (ref 8.4–10.5)
CO2: 24 meq/L (ref 19–32)
Chloride: 109 mEq/L (ref 96–112)
Creatinine, Ser: 2.98 mg/dL — ABNORMAL HIGH (ref 0.40–1.50)
GFR: 26.04 mL/min — AB (ref >60.00)
GLUCOSE: 98 mg/dL (ref 70–99)
POTASSIUM: 4.9 meq/L (ref 3.5–5.1)
Sodium: 141 mEq/L (ref 135–145)
TOTAL PROTEIN: 7.8 g/dL (ref 6.0–8.3)

## 2017-07-30 MED ORDER — METHYLPREDNISOLONE ACETATE 80 MG/ML IJ SUSP
120.0000 mg | Freq: Once | INTRAMUSCULAR | Status: AC
  Administered 2017-07-30: 120 mg via INTRAMUSCULAR

## 2017-07-30 MED ORDER — COLCHICINE 0.6 MG PO CAPS: 1.0000 | ORAL_CAPSULE | Freq: Two times a day (BID) | ORAL | 3 refills | Status: DC

## 2017-07-30 NOTE — Progress Notes (Signed)
Subjective:  Patient ID: Robert Barnett, male    DOB: September 06, 1932  Age: 83 y.o. MRN: [id number]  CC: Hypertension and Hypothyroidism   HPI Robert Barnett [name] presents for f/up - He complains of a 4 day history of non-traumatic redness, pain, swelling in his right foot. Over the last few weeks he's also had worsening edema in his lower extremities.  Outpatient Medications Prior to Visit  Medication Sig Dispense Refill  . aspirin 81 MG tablet Take 81 mg by mouth daily.    . benazepril (LOTENSIN) 40 MG tablet TAKE 1 TABLET BY MOUTH EVERY DAY 90 tablet 1  . clopidogrel (PLAVIX) 75 MG tablet Take 1 tablet (75 mg total) by mouth daily. 90 tablet 1  . donepezil (ARICEPT) 10 MG tablet Take 1 tablet (10 mg total) by mouth at bedtime. 90 tablet 3  . levothyroxine (SYNTHROID, LEVOTHROID) 50 MCG tablet TAKE 1 TABLET (50 MCG TOTAL) BY MOUTH DAILY. 90 tablet 1  . amLODipine (NORVASC) 10 MG tablet TAKE 1 TABLET BY MOUTH DAILY 90 tablet 3  . pantoprazole (PROTONIX) 40 MG tablet     . simvastatin (ZOCOR) 20 MG tablet Take 20 mg by mouth daily.    . tamsulosin (FLOMAX) 0.4 MG CAPS capsule     . HYDROcodone-acetaminophen (NORCO/VICODIN) 5-325 MG tablet Take 1 tablet by mouth every 6 (six) hours as needed for moderate pain. (Patient not taking: Reported on 04/29/2017) 65 tablet 0   No facility-administered medications prior to visit.     ROS Review of Systems  Constitutional: Negative.  Negative for chills, diaphoresis, fatigue and fever.  HENT: Negative.   Eyes: Negative.   Respiratory: Negative.  Negative for cough, chest tightness, shortness of breath and wheezing.   Cardiovascular: Positive for leg swelling. Negative for chest pain and palpitations.  Gastrointestinal: Negative for abdominal pain, constipation, diarrhea, nausea and vomiting.  Endocrine: Negative.   Genitourinary: Negative.  Negative for difficulty urinating, dysuria and urgency.  Musculoskeletal: Positive for arthralgias. Negative for  back pain, myalgias and neck pain.  Skin: Negative.   Allergic/Immunologic: Negative.   Neurological: Negative.  Negative for dizziness, weakness and headaches.  Hematological: Negative for adenopathy. Does not bruise/bleed easily.  Psychiatric/Behavioral: Negative.     Objective:  BP (!) 98/54 (BP Location: Left Arm, Patient Position: Sitting, Cuff Size: Normal)   Pulse 64   Temp 98.6 F (37 C) (Oral)   Resp 16   Ht 5\' 10"  (1.778 m)   Wt 235 lb 8 oz (106.8 kg)   SpO2 98%   BMI 33.79 kg/m   BP Readings from Last 3 Encounters:  07/30/17 (!) 98/54  04/29/17 (!) 122/58  10/09/16 130/72    Wt Readings from Last 3 Encounters:  07/30/17 235 lb 8 oz (106.8 kg)  04/29/17 239 lb (108.4 kg)  10/09/16 232 lb 1 oz (105.3 kg)    Physical Exam  Constitutional: He is oriented to person, place, and time. No distress.  HENT:  Mouth/Throat: Oropharynx is clear and moist. No oropharyngeal exudate.  Eyes: Conjunctivae are normal. Right eye exhibits no discharge. Left eye exhibits no discharge. No scleral icterus.  Neck: Normal range of motion. Neck supple. No JVD present. No thyromegaly present.  Cardiovascular: Normal rate, regular rhythm and intact distal pulses.  Exam reveals no gallop and no friction rub.   No murmur heard. Pulmonary/Chest: Effort normal and breath sounds normal. No respiratory distress. He has no wheezes. He has no rales. He exhibits no tenderness.  Abdominal: Soft.  Bowel sounds are normal. He exhibits no distension and no mass. There is no tenderness. There is no rebound and no guarding.  Musculoskeletal: Normal range of motion. He exhibits edema. He exhibits no tenderness or deformity.       Feet:  2+ pitting edema in the right lower extremity and trace pitting edema in the left lower extremity  Neurological: He is alert and oriented to person, place, and time.  Skin: Skin is warm and dry. No rash noted. He is not diaphoretic. No erythema. No pallor.  Vitals  reviewed.   Lab Results  Component Value Date   WBC 7.5 07/30/2017   HGB 12.2 (L) 07/30/2017   HCT 38.2 (L) 07/30/2017   PLT 249.0 07/30/2017   GLUCOSE 98 07/30/2017   CHOL 113 07/30/2017   TRIG 95.0 07/30/2017   HDL 30.60 (L) 07/30/2017   LDLCALC 63 07/30/2017   ALT 9 07/30/2017   AST 15 07/30/2017   NA 141 07/30/2017   K 4.9 07/30/2017   CL 109 07/30/2017   CREATININE 2.98 (H) 07/30/2017   BUN 43 (H) 07/30/2017   CO2 24 07/30/2017   TSH 3.07 07/30/2017   INR 1.02 12/18/2015   HGBA1C 5.6 07/23/2016    Dg Lumbar Spine Complete  Result Date: 08/19/2016 CLINICAL DATA:  Low back pain for 3 weeks, no injury EXAM: LUMBAR SPINE - COMPLETE 4+ VIEW COMPARISON:  CT abdomen and pelvis of 07/09/2016 FINDINGS: The lumbar vertebrae are in normal alignment. Intervertebral disc spaces appear normal. There are anterior osteophytes diffusely. No compression deformity is seen. Abdominal aortic atherosclerosis is noted. The SI joints appear corticated. IMPRESSION: Normal alignment with normal disc spaces. Anterior osteophytes are present diffusely. Abdominal aortic atherosclerosis. Electronically Signed   By: Ivar Drape M.D.   On: 08/19/2016 08:11    Assessment & Plan:   Robert Barnett was seen today for hypertension and hypothyroidism.  Diagnoses and all orders for this visit:  Hypothyroidism due to medication- his TSH is in the normal range, he will stay on the current dose of T4. -     TSH; Future  Essential hypertension- his blood pressure is over controlled and he has peripheral edema so I have asked him to stop taking amlodipine. His electrolytes are normal and his renal function is stable. -     Comprehensive metabolic panel; Future -     CBC with Differential/Platelet; Future  Abdominal aortic atherosclerosis ([place])- Will continue risk factor modification. -     Lipid panel; Future  Acute idiopathic gout of right foot- will treat this with an injection of Depo-Medrol and starting  colchicine. I've asked him to avoid NSAIDs due to his history of renal insufficiency. His uric acid level is elevated but at this time it's not prudent to start an agent to lower his uric acid level as it may increase complications from gouty arthropathy. -     Uric acid; Future -     Sedimentation rate; Future -     Colchicine (MITIGARE) 0.6 MG CAPS; Take 1 tablet by mouth 2 (two) times daily.   I have discontinued Mr. Heberle HYDROcodone-acetaminophen and amLODipine. I am also having him start on Colchicine. Additionally, I am having him maintain his aspirin, simvastatin, pantoprazole, tamsulosin, clopidogrel, benazepril, donepezil, and levothyroxine. We administered methylPREDNISolone acetate.  Meds ordered this encounter  Medications  . Colchicine (MITIGARE) 0.6 MG CAPS    Sig: Take 1 tablet by mouth 2 (two) times daily.    Dispense:  60 capsule  Refill:  3  . methylPREDNISolone acetate (DEPO-MEDROL) injection 120 mg     Follow-up: Return in about 3 weeks (around 08/20/2017).  Scarlette Calico, MD

## 2017-07-30 NOTE — Patient Instructions (Signed)

## 2017-07-31 LAB — TSH: TSH: 3.07 u[IU]/mL (ref 0.35–4.50)

## 2017-08-01 ENCOUNTER — Encounter: Payer: Self-pay | Admitting: Internal Medicine

## 2017-09-02 ENCOUNTER — Encounter: Payer: Self-pay | Admitting: Internal Medicine

## 2017-09-02 ENCOUNTER — Encounter (HOSPITAL_COMMUNITY): Payer: Self-pay | Admitting: *Deleted

## 2017-09-02 ENCOUNTER — Emergency Department (HOSPITAL_COMMUNITY): Payer: Medicare Other

## 2017-09-02 ENCOUNTER — Inpatient Hospital Stay (HOSPITAL_COMMUNITY)
Admission: EM | Admit: 2017-09-02 | Discharge: 2017-09-07 | DRG: 394 | Disposition: A | Discharge status: Home Health | Payer: Medicare Other | Attending: Internal Medicine | Admitting: Internal Medicine

## 2017-09-02 ENCOUNTER — Ambulatory Visit (INDEPENDENT_AMBULATORY_CARE_PROVIDER_SITE_OTHER): Payer: Medicare Other | Admitting: Internal Medicine

## 2017-09-02 VITALS — BP 120/70 | HR 72 | Temp 98.4°F | Resp 16 | Ht 70.0 in | Wt 220.0 lb

## 2017-09-02 DIAGNOSIS — T8149XA Infection following a procedure, other surgical site, initial encounter: Secondary | ICD-10-CM

## 2017-09-02 DIAGNOSIS — N400 Enlarged prostate without lower urinary tract symptoms: Secondary | ICD-10-CM | POA: Diagnosis present

## 2017-09-02 DIAGNOSIS — E785 Hyperlipidemia, unspecified: Secondary | ICD-10-CM | POA: Diagnosis present

## 2017-09-02 DIAGNOSIS — N322 Vesical fistula, not elsewhere classified: Secondary | ICD-10-CM

## 2017-09-02 DIAGNOSIS — R1310 Dysphagia, unspecified: Secondary | ICD-10-CM | POA: Diagnosis present

## 2017-09-02 DIAGNOSIS — L02211 Cutaneous abscess of abdominal wall: Secondary | ICD-10-CM | POA: Insufficient documentation

## 2017-09-02 DIAGNOSIS — N184 Chronic kidney disease, stage 4 (severe): Secondary | ICD-10-CM | POA: Diagnosis present

## 2017-09-02 DIAGNOSIS — Z8673 Personal history of transient ischemic attack (TIA), and cerebral infarction without residual deficits: Secondary | ICD-10-CM

## 2017-09-02 DIAGNOSIS — N321 Vesicointestinal fistula: Secondary | ICD-10-CM | POA: Diagnosis present

## 2017-09-02 DIAGNOSIS — Z7982 Long term (current) use of aspirin: Secondary | ICD-10-CM

## 2017-09-02 DIAGNOSIS — T814XXA Infection following a procedure, initial encounter: Secondary | ICD-10-CM | POA: Diagnosis not present

## 2017-09-02 DIAGNOSIS — I129 Hypertensive chronic kidney disease with stage 1 through stage 4 chronic kidney disease, or unspecified chronic kidney disease: Secondary | ICD-10-CM | POA: Diagnosis present

## 2017-09-02 DIAGNOSIS — I739 Peripheral vascular disease, unspecified: Secondary | ICD-10-CM | POA: Diagnosis present

## 2017-09-02 DIAGNOSIS — F03B0 Unspecified dementia, moderate, without behavioral disturbance, psychotic disturbance, mood disturbance, and anxiety: Secondary | ICD-10-CM | POA: Diagnosis present

## 2017-09-02 DIAGNOSIS — K632 Fistula of intestine: Secondary | ICD-10-CM | POA: Diagnosis not present

## 2017-09-02 DIAGNOSIS — I10 Essential (primary) hypertension: Secondary | ICD-10-CM | POA: Diagnosis present

## 2017-09-02 DIAGNOSIS — Z8661 Personal history of infections of the central nervous system: Secondary | ICD-10-CM

## 2017-09-02 DIAGNOSIS — L0291 Cutaneous abscess, unspecified: Secondary | ICD-10-CM

## 2017-09-02 DIAGNOSIS — Z87891 Personal history of nicotine dependence: Secondary | ICD-10-CM

## 2017-09-02 DIAGNOSIS — E039 Hypothyroidism, unspecified: Secondary | ICD-10-CM | POA: Diagnosis present

## 2017-09-02 DIAGNOSIS — F0390 Unspecified dementia without behavioral disturbance: Secondary | ICD-10-CM | POA: Diagnosis present

## 2017-09-02 DIAGNOSIS — I714 Abdominal aortic aneurysm, without rupture: Secondary | ICD-10-CM | POA: Diagnosis present

## 2017-09-02 DIAGNOSIS — Z7902 Long term (current) use of antithrombotics/antiplatelets: Secondary | ICD-10-CM

## 2017-09-02 HISTORY — DX: Unspecified dementia, unspecified severity, without behavioral disturbance, psychotic disturbance, mood disturbance, and anxiety: F03.90

## 2017-09-02 LAB — COMPREHENSIVE METABOLIC PANEL
ALK PHOS: 70 U/L (ref 38–126)
ALT: 17 U/L (ref 17–63)
AST: 32 U/L (ref 15–41)
Albumin: 3.2 g/dL — ABNORMAL LOW (ref 3.5–5.0)
Anion gap: 7 (ref 5–15)
BILIRUBIN TOTAL: 0.6 mg/dL (ref 0.3–1.2)
BUN: 36 mg/dL — ABNORMAL HIGH (ref 6–20)
CALCIUM: 8.5 mg/dL — AB (ref 8.9–10.3)
CO2: 23 mmol/L (ref 22–32)
Chloride: 110 mmol/L (ref 101–111)
Creatinine, Ser: 2.74 mg/dL — ABNORMAL HIGH (ref 0.61–1.24)
GFR calc Af Amer: 23 mL/min — ABNORMAL LOW (ref >60)
GFR, EST NON AFRICAN AMERICAN: 20 mL/min — AB (ref >60)
Glucose, Bld: 94 mg/dL (ref 65–99)
Potassium: 5 mmol/L (ref 3.5–5.1)
Sodium: 140 mmol/L (ref 135–145)
Total Protein: 7.2 g/dL (ref 6.5–8.1)

## 2017-09-02 LAB — CBC WITH DIFFERENTIAL/PLATELET
BASOS ABS: 0 10*3/uL (ref 0.0–0.1)
BASOS PCT: 1 %
EOS ABS: 0.1 10*3/uL (ref 0.0–0.7)
Eosinophils Relative: 1 %
HEMATOCRIT: 37.9 % — AB (ref 39.0–52.0)
HEMOGLOBIN: 12 g/dL — AB (ref 13.0–17.0)
Lymphocytes Relative: 17 %
Lymphs Abs: 1.4 10*3/uL (ref 0.7–4.0)
MCH: 30.9 pg (ref 26.0–34.0)
MCHC: 31.7 g/dL (ref 30.0–36.0)
MCV: 97.7 fL (ref 78.0–100.0)
MONO ABS: 1.1 10*3/uL — AB (ref 0.1–1.0)
MONOS PCT: 14 %
NEUTROS ABS: 5.6 10*3/uL (ref 1.7–7.7)
NEUTROS PCT: 67 %
PLATELETS: 157 10*3/uL (ref 150–400)
RBC: 3.88 MIL/uL — ABNORMAL LOW (ref 4.22–5.81)
RDW: 14.9 % (ref 11.5–15.5)
WBC: 8.2 10*3/uL (ref 4.0–10.5)

## 2017-09-02 LAB — I-STAT CHEM 8, ED
BUN: 40 mg/dL — ABNORMAL HIGH (ref 6–20)
CHLORIDE: 111 mmol/L (ref 101–111)
CREATININE: 2.7 mg/dL — AB (ref 0.61–1.24)
Calcium, Ion: 1.06 mmol/L — ABNORMAL LOW (ref 1.15–1.40)
GLUCOSE: 95 mg/dL (ref 65–99)
HCT: 37 % — ABNORMAL LOW (ref 39.0–52.0)
Hemoglobin: 12.6 g/dL — ABNORMAL LOW (ref 13.0–17.0)
POTASSIUM: 5 mmol/L (ref 3.5–5.1)
Sodium: 142 mmol/L (ref 135–145)
TCO2: 23 mmol/L (ref 22–32)

## 2017-09-02 LAB — I-STAT CG4 LACTIC ACID, ED: Lactic Acid, Venous: 1.64 mmol/L (ref 0.5–1.9)

## 2017-09-02 MED ORDER — MORPHINE SULFATE (PF) 2 MG/ML IV SOLN: 2.0000 mg | Freq: Once | INTRAVENOUS | Status: DC

## 2017-09-02 MED ORDER — PIPERACILLIN-TAZOBACTAM 3.375 G IVPB 30 MIN
3.3750 g | Freq: Once | INTRAVENOUS | Status: AC
  Administered 2017-09-02: 3.375 g via INTRAVENOUS
  Filled 2017-09-02: qty 50

## 2017-09-02 MED ORDER — ONDANSETRON HCL 4 MG/2ML IJ SOLN: 4.0000 mg | Freq: Once | INTRAMUSCULAR | Status: DC

## 2017-09-02 NOTE — ED Triage Notes (Signed)
Pt sent by his PCP for abscess to his abdomen at the site of a surgical incision. Pt has drainage from abscess.

## 2017-09-02 NOTE — Progress Notes (Signed)
Subjective:  Patient ID: Robert Barnett, male    DOB: Nov 26, 1932  Age: 83 y.o. MRN: [id number]  CC: Recurrent Skin Infections   HPI Robert Barnett presents for concerns about a 1 week hx of worsening pain, swelling, pus draining from a prior drainage tube sight over his LLQ.  No facility-administered medications prior to visit.    Outpatient Medications Prior to Visit  Medication Sig Dispense Refill  . aspirin 81 MG tablet Take 81 mg by mouth daily.    . benazepril (LOTENSIN) 40 MG tablet TAKE 1 TABLET BY MOUTH EVERY DAY 90 tablet 1  . clopidogrel (PLAVIX) 75 MG tablet Take 1 tablet (75 mg total) by mouth daily. 90 tablet 1  . Colchicine (MITIGARE) 0.6 MG CAPS Take 1 tablet by mouth 2 (two) times daily. 60 capsule 3  . donepezil (ARICEPT) 10 MG tablet Take 1 tablet (10 mg total) by mouth at bedtime. 90 tablet 3  . levothyroxine (SYNTHROID, LEVOTHROID) 50 MCG tablet TAKE 1 TABLET (50 MCG TOTAL) BY MOUTH DAILY. 90 tablet 1  . simvastatin (ZOCOR) 20 MG tablet Take 20 mg by mouth daily.    . pantoprazole (PROTONIX) 40 MG tablet     . tamsulosin (FLOMAX) 0.4 MG CAPS capsule Take 0.4 mg by mouth daily.       ROS Review of Systems  Constitutional: Negative for appetite change, chills, fatigue and fever.  HENT: Negative.  Negative for trouble swallowing.   Eyes: Negative.   Respiratory: Negative.  Negative for chest tightness, shortness of breath and wheezing.   Cardiovascular: Negative for chest pain, palpitations and leg swelling.  Gastrointestinal: Positive for abdominal pain. Negative for constipation and diarrhea.  Endocrine: Negative.   Genitourinary: Negative.   Musculoskeletal: Negative.   Skin: Negative.   Allergic/Immunologic: Negative.   Neurological: Negative.   Hematological: Negative for adenopathy. Does not bruise/bleed easily.  Psychiatric/Behavioral: Negative.     Objective:  BP 120/70 (BP Location: Left Arm, Patient Position: Sitting, Cuff Size: Normal)   Pulse  72   Temp 98.4 F (36.9 C) (Oral)   Resp 16   Ht 5\' 10"  (1.778 m)   Wt 220 lb (99.8 kg)   SpO2 98%   BMI 31.57 kg/m   BP Readings from Last 3 Encounters:  09/03/17 121/60  09/02/17 120/70  07/30/17 (!) 98/54    Wt Readings from Last 3 Encounters:  09/03/17 217 lb 6 oz (98.6 kg)  09/02/17 220 lb (99.8 kg)  07/30/17 235 lb 8 oz (106.8 kg)    Physical Exam  Constitutional: He is oriented to person, place, and time.  Non-toxic appearance. He does not have a sickly appearance. He does not appear ill. No distress.  HENT:  Mouth/Throat: Oropharynx is clear and moist. No oropharyngeal exudate.  Eyes: Conjunctivae are normal. Right eye exhibits no discharge. Left eye exhibits no discharge. No scleral icterus.  Neck: Normal range of motion. Neck supple. No JVD present. No thyromegaly present.  Cardiovascular: Normal rate, regular rhythm and intact distal pulses.  Exam reveals no gallop and no friction rub.   No murmur heard. Pulmonary/Chest: Effort normal and breath sounds normal. No respiratory distress. He has no wheezes. He has no rales. He exhibits no tenderness.  Abdominal: Bowel sounds are normal. He exhibits no mass. There is no hepatosplenomegaly. There is tenderness in the left lower quadrant. There is no rebound, no guarding and no CVA tenderness.    Musculoskeletal: Normal range of motion. He exhibits no edema, tenderness  or deformity.  Neurological: He is alert and oriented to person, place, and time.  Skin: Skin is warm and dry. No rash noted. He is not diaphoretic. No erythema. No pallor.  Vitals reviewed.   Lab Results  Component Value Date   WBC 8.2 09/02/2017   HGB 12.6 (L) 09/02/2017   HCT 37.0 (L) 09/02/2017   PLT 157 09/02/2017   GLUCOSE 95 09/02/2017   CHOL 113 07/30/2017   TRIG 95.0 07/30/2017   HDL 30.60 (L) 07/30/2017   LDLCALC 63 07/30/2017   ALT 17 09/02/2017   AST 32 09/02/2017   NA 142 09/02/2017   K 5.0 09/02/2017   CL 111 09/02/2017    CREATININE 2.70 (H) 09/02/2017   BUN 40 (H) 09/02/2017   CO2 23 09/02/2017   TSH 3.07 07/30/2017   INR 1.02 12/18/2015   HGBA1C 5.6 07/23/2016    Dg Lumbar Spine Complete  Result Date: 08/19/2016 CLINICAL DATA:  Low back pain for 3 weeks, no injury EXAM: LUMBAR SPINE - COMPLETE 4+ VIEW COMPARISON:  CT abdomen and pelvis of 07/09/2016 FINDINGS: The lumbar vertebrae are in normal alignment. Intervertebral disc spaces appear normal. There are anterior osteophytes diffusely. No compression deformity is seen. Abdominal aortic atherosclerosis is noted. The SI joints appear corticated. IMPRESSION: Normal alignment with normal disc spaces. Anterior osteophytes are present diffusely. Abdominal aortic atherosclerosis. Electronically Signed   By: Ivar Drape M.D.   On: 08/19/2016 08:11    Assessment & Plan:   Robert Barnett was seen today for recurrent skin infections.  Diagnoses and all orders for this visit:  Abdominal wall abscess at site of surgical wound- emergent referral to the ED for E/T -     WOUND CULTURE; Future   I am having Robert Barnett maintain his aspirin, simvastatin, clopidogrel, benazepril, donepezil, levothyroxine, and Colchicine.  No orders of the defined types were placed in this encounter.    Follow-up: Return if symptoms worsen or fail to improve.  Robert Calico, MD

## 2017-09-02 NOTE — H&P (Signed)
History and Physical    Robert Barnett XNA:[id number] DOB: 01/04/1932 DOA: 09/02/2017  PCP: Janith Lima, MD Consultants:  Rosendo Gros Patient coming from:  Home - lives alone; [facility]: daughter, [phone]  Chief Complaint: abscess  HPI: Robert Barnett is a 83 y.o. male with medical history significant of hypertension, hyperlipidemia, prior stroke, dysphagia, BPH, dementia, and remote (11/15) abscess of abdominal cavity involving the dome of the bladder with possible complication from an untreated diverticular abscess with IR drain placed.  He has had no other apparent issues until recently. He was seen by his PCP today because there was a drain in his abdomen and that area now has purulent drainage; the PCP sent him to the ER.  Daughter reports that he isn't feeling bad.  Daughter thought he was peeing on himself because of the yellow stuff on his t-shirt, but she looked more closely and saw the hole and the yellow stuff coming out.  She saw this last week.  Placed bandages and other stuff on the belly without relief.  She took him to Dr. Ronnald Ramp today and he sent them in to the ER.  No fevers.  Not sleeping, not more confused.  He pees a lot and has diarrhea a lot.   ED Course: CT with recurrent abscess and fistula.  Zosyn, surgery consult.  Likely needs IR in the AM.   Review of Systems: As per HPI; otherwise review of systems reviewed and negative.  Limited by patient's dementia.  Ambulatory Status:  Ambulates without assistance  Past Medical History:  Diagnosis Date  . Arthritis   . BPH (benign prostatic hyperplasia)   . Carotid artery occlusion   . Dementia   . Dysphagia   . Hyperlipidemia   . Hypertension   . Peripheral vascular disease ([place])   . Stroke [facility])    Right hemispheric CVA  . Umbilical hernia     Past Surgical History:  Procedure Laterality Date  . CAROTID ENDARTERECTOMY  10/06/2007   Right CEA by Dr. Amedeo Plenty  . IR GENERIC HISTORICAL  07/09/2016   IR RADIOLOGIST EVAL &  MGMT 07/09/2016 Arne Cleveland, MD [facility] INTERV RAD  . PEG TUBE PLACEMENT  2008   after CVA    Social History   Social History  . Marital status: Widowed    Spouse name: N/A  . Number of children: N/A  . Years of education: N/A   Occupational History  . Retired    Social History Main Topics  . Smoking status: Former Research scientist (life sciences)  . Smokeless tobacco: Never Used     Comment: remote h/o  . Alcohol use No  . Drug use: No  . Sexual activity: Not on file   Other Topics Concern  . Not on file   Social History Narrative  . No narrative on file    No Known Allergies  History reviewed. No pertinent family history.  Prior to Admission medications   Medication Sig Start Date End Date Taking? Authorizing Provider  aspirin 81 MG tablet Take 81 mg by mouth daily.   Yes [provider]  benazepril (LOTENSIN) 40 MG tablet TAKE 1 TABLET BY MOUTH EVERY DAY 03/30/17  Yes Janith Lima, MD  clopidogrel (PLAVIX) 75 MG tablet Take 1 tablet (75 mg total) by mouth daily. 03/16/17  Yes Janith Lima, MD  Colchicine (MITIGARE) 0.6 MG CAPS Take 1 tablet by mouth 2 (two) times daily. 07/30/17  Yes Janith Lima, MD  donepezil (ARICEPT) 10 MG tablet  Take 1 tablet (10 mg total) by mouth at bedtime. 04/29/17  Yes Cameron Sprang, MD  levothyroxine (SYNTHROID, LEVOTHROID) 50 MCG tablet TAKE 1 TABLET (50 MCG TOTAL) BY MOUTH DAILY. 05/29/17  Yes Janith Lima, MD  simvastatin (ZOCOR) 20 MG tablet Take 20 mg by mouth daily.   Yes [provider]    Physical Exam: Vitals:   09/02/17 1607 09/02/17 1958 09/02/17 2259 09/03/17 0138  BP: 135/89 (!) 147/64 (!) 149/60 139/61  Pulse: 77 73 70 (!) 45  Resp: 16 18 18 18   Temp: 98.6 F (37 C)  98.4 F (36.9 C) 98.2 F (36.8 C)  TempSrc: Oral  Oral Oral  SpO2: 97% 98% 97% 98%  Weight: 98.4 kg (217 lb)   98.6 kg (217 lb 6 oz)  Height:    5\' 6"  (1.676 m)     General:  Appears calm and comfortable and is NAD Eyes:  PERRL, EOMI, normal lids,  iris ENT:  grossly normal hearing, lips & tongue, mmm; edentulous Neck:  no LAD, masses or thyromegaly; no carotid bruits Cardiovascular:  RRR, no m/r/g. No LE edema.  Respiratory:   CTA bilaterally with no wheezes/rales/rhonchi.  Normal respiratory effort. Abdomen:  soft, NT, ND, NABS.  There is a large (roughly 10 cm) fluctuant mass on the left lower quadrant with purulent drainage and surrounding erythema. Skin:  no rash or induration seen on limited exam other than as described above Musculoskeletal:  grossly normal tone BUE/BLE, good ROM, no bony abnormality Lower extremity:  No LE edema.  Limited foot exam with no ulcerations.  2+ distal pulses. Psychiatric:  grossly normal mood and affect, speech fluent and appropriate, AOx2 Neurologic:  CN 2-12 grossly intact, moves all extremities in coordinated fashion, sensation intact    Radiological Exams on Admission: Ct Abdomen Pelvis Wo Contrast  Result Date: 09/02/2017 CLINICAL DATA:  Abdominal pain with fever EXAM: CT ABDOMEN AND PELVIS WITHOUT CONTRAST TECHNIQUE: Multidetector CT imaging of the abdomen and pelvis was performed following the standard protocol without IV contrast. COMPARISON:  07/09/2016, 07/09/2016, 09/27/2015 FINDINGS: Lower chest: Lung bases demonstrate no acute consolidation or pleural effusion. The heart is nonenlarged. Hepatobiliary: Stable subcentimeter hypodensity within the dome of the left lobe. Multiple calcified gallstones. No biliary dilatation Pancreas: Unremarkable. No pancreatic ductal dilatation or surrounding inflammatory changes. Spleen: Normal in size without focal abnormality. Adrenals/Urinary Tract: Adrenal glands are within normal limits. Slightly atrophic kidneys with perinephric fat stranding. Bilateral renal cysts. No hydronephrosis. Bladder is thick-walled. Stomach/Bowel: The stomach is decompressed. No dilated small bowel. Sigmoid colon diverticular disease without acute wall thickening Vascular/Lymphatic:  Aortic atherosclerosis. 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 No significantly enlarged abdominal or pelvic lymph nodes Reproductive: Slightly enlarged prostate Other: Negative for free air. Fat in the inguinal canals. There is moderate soft tissue stranding and inflammatory change within the anterior pelvic wall at the site of prior interventional catheter. There is a gas and fluid filled tract extending to the skin. This is contiguous with a gas and fluid collection that measures 4 x 3.2 cm, it is positions between the bladder and the sigmoid colon. It is concerning for a recurrent abscess. Suspect that there is residual fistula between the distal sigmoid colon and the bladder, series 2, image number 67 and series 4, image number 84 Musculoskeletal: Degenerative changes. No acute or suspicious  bone lesion IMPRESSION: 1. 4 x 3.2 cm gas and fluid collection within the anterior pelvis, anterior to the dome of the bladder, concerning for a recurrent abscess. This is contiguous with a fluid and gas filled trach that extends to the skin surface, compatible with a fistula. There is moderate soft tissue stranding and inflammation around the fistulous tract within the anterior pelvic subcutaneous soft tissues. 2. 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 3. Sigmoid colon diverticular disease 4. 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 Recommend followup by ultrasound in 1 year. This recommendation follows ACR consensus guidelines: White Paper of the ACR Incidental Findings Committee II on Vascular Findings. J Am Coll Radiol 2013; 10:789-794. 5. Multiple calcified gallstones Electronically Signed   By: Donavan Foil M.D.   On: 09/02/2017 22:52    EKG: not done   Labs  on Admission: I have personally reviewed the available labs and imaging studies at the time of the admission.  Pertinent labs:   BUN 36/Creatinine 2.74/GFR 23 - stable WBC 8.2 Hgb 12.0  Assessment/Plan Principal Problem:   Abdominal wall abscess Active Problems:   Moderate dementia without behavioral disturbance   Essential hypertension   Colovesical fistula   CKD (chronic kidney disease) stage 4, GFR 15-29 ml/min (HCC)   AAA (abdominal aortic aneurysm) (HCC)   Abdominal wall abscess -There is concern for fistula formation to the dome of the bladder -For now, there is clearly an abscess -Will treat with Zosyn but needs either surgical drainage or IR-drainage -Dr. Kae Heller to see the patient in the ER tonight  Colvesical fistula -This is now his second serious problem with this -Would suggest serious consideration of further evaluation with C-scope and cystoscopy and then plan for surgical repair as an inpatient once the abscess is drained, if possible - he lives alone, has had multiple recurrences, and is likely to have been quality of life without this as a recurrent problem -Will hold Plavix for now in case of plan to repair the fistula as an inpatient  Dementia -Continue Aricept  HTN -Continue Lotensin  CKD -Appears to be stable -Judicious IVF and recheck BMP in AM  AAA -Stable -Needs f/u US in 1 year, if appropriate  DVT prophylaxis: SCDs Code Status:  Full - confirmed with patient/family Family Communication: Daughter present throughout evaluation  Disposition Plan:  Home once clinically improved Consults called: Surgery; likely to need IR; may also need Gi and/or urology  Admission status: Admit - It is my clinical opinion that admission to INPATIENT is reasonable and necessary because this patient will require at least 2 midnights in the hospital to treat this condition based on the medical complexity of the problems presented.  Given the aforementioned  information, the predictability of an adverse outcome is felt to be significant.     Karmen Bongo MD [facility]  If note is complete, please contact covering daytime or nighttime physician. [email or web address] Password [email or web address]  09/03/2017, 1:40 AM

## 2017-09-02 NOTE — Patient Instructions (Signed)

## 2017-09-02 NOTE — ED Provider Notes (Signed)
[place] DEPT Provider Note   CSN: [id number] Arrival date & time: 09/02/17  1528     History   Chief Complaint Chief Complaint  Patient presents with  . Abscess    abdominal     HPI Robert Barnett is a 83 y.o. male.  83 yo M with a chief complaint of a wound to his abdomen. This been going on for the past couple weeks. Family thinks is getting worse. Now having some purulent drainage in the area. Denies fevers or chills. All her family physician today who told them to come straight to the emergency department. 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 He had a drain in place for what family said was for 5 years. Think it's been out for about the past year or so.   The history is provided by the patient.  Abscess  Location:  Torso Torso abscess location:  Abd LLQ Abscess quality: draining, induration, painful and redness   Red streaking: yes   Duration:  2 weeks Progression:  Worsening Pain details:    Quality:  Dull, sharp and shooting   Severity:  Moderate   Duration:  2 days   Timing:  Constant   Progression:  Worsening Chronicity:  New Relieved by:  Nothing Worsened by:  Nothing Ineffective treatments:  None tried Associated symptoms: no fever, no headaches and no vomiting     Past Medical History:  Diagnosis Date  . Arthritis   . BPH (benign prostatic hyperplasia)   . Carotid artery occlusion   . Dysphagia   . Hyperlipidemia   . Hypertension   . Peripheral vascular disease ([place])   . Stroke [facility])    Right hemispheric CVA  . Umbilical hernia     Patient Active Problem List   Diagnosis Date Noted  . Abdominal wall abscess at site of surgical wound 09/02/2017  . Essential hypertension 07/30/2017  . Acute idiopathic gout of right foot 07/30/2017  . Abdominal aortic atherosclerosis ([place]) 08/19/2016  . Bilateral low back pain without sciatica 08/18/2016  . Hyperglycemia 07/23/2016  . Hypothyroidism 07/23/2016  .  Moderate dementia without behavioral disturbance 07/23/2016  . Abdominal fistula 11/22/2014  . Hyperlipidemia 11/17/2014  . Dysphagia   . Stroke ([place])   . BPH (benign prostatic hyperplasia)     Past Surgical History:  Procedure Laterality Date  . CAROTID ENDARTERECTOMY  10/06/2007   Right CEA by Dr. Amedeo Plenty  . IR GENERIC HISTORICAL  07/09/2016   IR RADIOLOGIST EVAL & MGMT 07/09/2016 Arne Cleveland, MD [facility] INTERV RAD  . PEG TUBE PLACEMENT  2008       Home Medications    Prior to Admission medications   Medication Sig Start Date End Date Taking? Authorizing Provider  aspirin 81 MG tablet Take 81 mg by mouth daily.   Yes [provider]  benazepril (LOTENSIN) 40 MG tablet TAKE 1 TABLET BY MOUTH EVERY DAY 03/30/17  Yes Janith Lima, MD  clopidogrel (PLAVIX) 75 MG tablet Take 1 tablet (75 mg total) by mouth daily. 03/16/17  Yes Janith Lima, MD  Colchicine (MITIGARE) 0.6 MG CAPS Take 1 tablet by mouth 2 (two) times daily. 07/30/17  Yes Janith Lima, MD  donepezil (ARICEPT) 10 MG tablet Take 1 tablet (10 mg total) by mouth at bedtime. 04/29/17  Yes Cameron Sprang, MD  levothyroxine (SYNTHROID, LEVOTHROID) 50 MCG tablet TAKE 1 TABLET (50 MCG TOTAL) BY MOUTH DAILY. 05/29/17  Yes Scarlette Calico  L, MD  simvastatin (ZOCOR) 20 MG tablet Take 20 mg by mouth daily.   Yes [provider]    Family History No family history on file.  Social History Social History  Substance Use Topics  . Smoking status: Former Research scientist (life sciences)  . Smokeless tobacco: Never Used  . Alcohol use No     Allergies   Patient has no known allergies.   Review of Systems Review of Systems  Constitutional: Negative for chills and fever.  HENT: Negative for congestion and facial swelling.   Eyes: Negative for discharge and visual disturbance.  Respiratory: Negative for shortness of breath.   Cardiovascular: Negative for chest pain and palpitations.  Gastrointestinal: Positive for abdominal pain.  Negative for diarrhea and vomiting.  Musculoskeletal: Negative for arthralgias and myalgias.  Skin: Negative for color change and rash.  Neurological: Negative for tremors, syncope and headaches.  Psychiatric/Behavioral: Negative for confusion and dysphoric mood.     Physical Exam Updated Vital Signs BP (!) 149/60 (BP Location: Right Arm)   Pulse 70   Temp 98.4 F (36.9 C) (Oral)   Resp 18   Wt 98.4 kg (217 lb)   SpO2 97%   BMI 31.14 kg/m   Physical Exam  Constitutional: He is oriented to person, place, and time. He appears well-developed and well-nourished.  HENT:  Head: Normocephalic and atraumatic.  Eyes: Pupils are equal, round, and reactive to light. EOM are normal.  Neck: Normal range of motion. Neck supple. No JVD present.  Cardiovascular: Normal rate and regular rhythm.  Exam reveals no gallop and no friction rub.   No murmur heard. Pulmonary/Chest: No respiratory distress. He has no wheezes.  Abdominal: He exhibits mass. He exhibits no distension. There is tenderness. There is no rebound and no guarding.    Musculoskeletal: Normal range of motion.  Neurological: He is alert and oriented to person, place, and time.  Skin: No rash noted. No pallor.  Psychiatric: He has a normal mood and affect. His behavior is normal.  Nursing note and vitals reviewed.    ED Treatments / Results  Labs (all labs ordered are listed, but only abnormal results are displayed) Labs Reviewed  CBC WITH DIFFERENTIAL/PLATELET - Abnormal; Notable for the following:       Result Value   RBC 3.88 (*)    Hemoglobin 12.0 (*)    HCT 37.9 (*)    Monocytes Absolute 1.1 (*)    All other components within normal limits  COMPREHENSIVE METABOLIC PANEL - Abnormal; Notable for the following:    BUN 36 (*)    Creatinine, Ser 2.74 (*)    Calcium 8.5 (*)    Albumin 3.2 (*)    GFR calc non Af Amer 20 (*)    GFR calc Af Amer 23 (*)    All other components within normal limits  I-STAT CHEM 8, ED -  Abnormal; Notable for the following:    BUN 40 (*)    Creatinine, Ser 2.70 (*)    Calcium, Ion 1.06 (*)    Hemoglobin 12.6 (*)    HCT 37.0 (*)    All other components within normal limits  I-STAT CG4 LACTIC ACID, ED    EKG  EKG Interpretation None       Radiology Ct Abdomen Pelvis Wo Contrast  Result Date: 09/02/2017 CLINICAL DATA:  Abdominal pain with fever EXAM: CT ABDOMEN AND PELVIS WITHOUT CONTRAST TECHNIQUE: Multidetector CT imaging of the abdomen and pelvis was performed following the standard protocol without  IV contrast. COMPARISON:  07/09/2016, 07/09/2016, 09/27/2015 FINDINGS: Lower chest: Lung bases demonstrate no acute consolidation or pleural effusion. The heart is nonenlarged. Hepatobiliary: Stable subcentimeter hypodensity within the dome of the left lobe. Multiple calcified gallstones. No biliary dilatation Pancreas: Unremarkable. No pancreatic ductal dilatation or surrounding inflammatory changes. Spleen: Normal in size without focal abnormality. Adrenals/Urinary Tract: Adrenal glands are within normal limits. Slightly atrophic kidneys with perinephric fat stranding. Bilateral renal cysts. No hydronephrosis. Bladder is thick-walled. Stomach/Bowel: The stomach is decompressed. No dilated small bowel. Sigmoid colon diverticular disease without acute wall thickening Vascular/Lymphatic: Aortic atherosclerosis. 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 No significantly enlarged abdominal or pelvic lymph nodes Reproductive: Slightly enlarged prostate Other: Negative for free air. Fat in the inguinal canals. There is moderate soft tissue stranding and inflammatory change within the anterior pelvic wall at the site of prior interventional catheter. There is a gas and fluid filled tract extending to the skin. This is contiguous with a gas  and fluid collection that measures 4 x 3.2 cm, it is positions between the bladder and the sigmoid colon. It is concerning for a recurrent abscess. Suspect that there is residual fistula between the distal sigmoid colon and the bladder, series 2, image number 67 and series 4, image number 84 Musculoskeletal: Degenerative changes. No acute or suspicious bone lesion IMPRESSION: 1. 4 x 3.2 cm gas and fluid collection within the anterior pelvis, anterior to the dome of the bladder, concerning for a recurrent abscess. This is contiguous with a fluid and gas filled trach that extends to the skin surface, compatible with a fistula. There is moderate soft tissue stranding and inflammation around the fistulous tract within the anterior pelvic subcutaneous soft tissues. 2. 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 3. Sigmoid colon diverticular disease 4. 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 Recommend followup by ultrasound in 1 year. This recommendation follows ACR consensus guidelines: White Paper of the ACR Incidental Findings Committee II on Vascular Findings. J Am Coll Radiol 2013; 10:789-794. 5. Multiple calcified gallstones Electronically Signed   By: Donavan Foil M.D.   On: 09/02/2017 22:52    Procedures Procedures (including critical care time)  Medications Ordered in ED Medications  morphine 2 MG/ML injection 2 mg (not administered)  ondansetron (ZOFRAN) injection 4 mg (not administered)  piperacillin-tazobactam (ZOSYN) IVPB 3.375 g (0 g Intravenous Stopped 09/02/17 2306)     Initial Impression / Assessment and Plan / ED Course  I have reviewed the triage vital signs and the nursing notes.  Pertinent labs & imaging results that were available during my care of the patient were reviewed by me and considered in my medical decision making (see chart for details).     83 yo M with a chief complaint of  purulent drainage from the wound coming from his abdomen. I'm concerned that the fistula has eroded into the abdominal wall. Will obtain a CT scan of the abdomen and pelvis.  CT scan with likely recurrent abscess and fistula. Given zosyn, will discuss with gen surgery.  Dr. Kae Heller to come and eval, continue zosyn, npo.  Likely IR in morning.   The patients results and plan were reviewed and discussed.   Any x-rays performed were independently reviewed by myself.  Differential diagnosis were considered with the presenting HPI.  Medications  morphine 2 MG/ML injection 2 mg (not administered)  ondansetron (ZOFRAN) injection 4 mg (not administered)  piperacillin-tazobactam (ZOSYN) IVPB 3.375 g (0 g Intravenous Stopped 09/02/17 2306)    Vitals:   09/02/17 1607 09/02/17 1958 09/02/17 2259  BP: 135/89 (!) 147/64 (!) 149/60  Pulse: 77 73 70  Resp: 16 18 18   Temp: 98.6 F (37 C)  98.4 F (36.9 C)  TempSrc: Oral  Oral  SpO2: 97% 98% 97%  Weight: 98.4 kg (217 lb)      Final diagnoses:  Abscess  Abdominal wall abscess  Bladder fistula    Admission/ observation were discussed with the admitting physician, patient and/or family and they are comfortable with the plan.    Final Clinical Impressions(s) / ED Diagnoses   Final diagnoses:  Abscess  Abdominal wall abscess  Bladder fistula    New Prescriptions New Prescriptions   No medications on file     Deno Etienne, DO 09/03/17 0004

## 2017-09-03 ENCOUNTER — Inpatient Hospital Stay (HOSPITAL_COMMUNITY): Payer: Medicare Other

## 2017-09-03 ENCOUNTER — Encounter (HOSPITAL_COMMUNITY): Payer: Self-pay | Admitting: Internal Medicine

## 2017-09-03 DIAGNOSIS — N322 Vesical fistula, not elsewhere classified: Secondary | ICD-10-CM | POA: Diagnosis not present

## 2017-09-03 DIAGNOSIS — I739 Peripheral vascular disease, unspecified: Secondary | ICD-10-CM | POA: Diagnosis present

## 2017-09-03 DIAGNOSIS — N400 Enlarged prostate without lower urinary tract symptoms: Secondary | ICD-10-CM | POA: Diagnosis present

## 2017-09-03 DIAGNOSIS — I129 Hypertensive chronic kidney disease with stage 1 through stage 4 chronic kidney disease, or unspecified chronic kidney disease: Secondary | ICD-10-CM | POA: Diagnosis present

## 2017-09-03 DIAGNOSIS — K632 Fistula of intestine: Principal | ICD-10-CM

## 2017-09-03 DIAGNOSIS — N184 Chronic kidney disease, stage 4 (severe): Secondary | ICD-10-CM | POA: Diagnosis present

## 2017-09-03 DIAGNOSIS — N321 Vesicointestinal fistula: Secondary | ICD-10-CM

## 2017-09-03 DIAGNOSIS — Z8661 Personal history of infections of the central nervous system: Secondary | ICD-10-CM | POA: Diagnosis not present

## 2017-09-03 DIAGNOSIS — Z7982 Long term (current) use of aspirin: Secondary | ICD-10-CM | POA: Diagnosis not present

## 2017-09-03 DIAGNOSIS — Z7902 Long term (current) use of antithrombotics/antiplatelets: Secondary | ICD-10-CM | POA: Diagnosis not present

## 2017-09-03 DIAGNOSIS — Z8673 Personal history of transient ischemic attack (TIA), and cerebral infarction without residual deficits: Secondary | ICD-10-CM | POA: Diagnosis not present

## 2017-09-03 DIAGNOSIS — F0390 Unspecified dementia without behavioral disturbance: Secondary | ICD-10-CM | POA: Diagnosis present

## 2017-09-03 DIAGNOSIS — E039 Hypothyroidism, unspecified: Secondary | ICD-10-CM | POA: Diagnosis present

## 2017-09-03 DIAGNOSIS — L02211 Cutaneous abscess of abdominal wall: Secondary | ICD-10-CM

## 2017-09-03 DIAGNOSIS — R1310 Dysphagia, unspecified: Secondary | ICD-10-CM | POA: Diagnosis present

## 2017-09-03 DIAGNOSIS — E785 Hyperlipidemia, unspecified: Secondary | ICD-10-CM | POA: Diagnosis present

## 2017-09-03 DIAGNOSIS — Z87891 Personal history of nicotine dependence: Secondary | ICD-10-CM | POA: Diagnosis not present

## 2017-09-03 DIAGNOSIS — I714 Abdominal aortic aneurysm, without rupture: Secondary | ICD-10-CM | POA: Diagnosis present

## 2017-09-03 LAB — URINALYSIS, ROUTINE W REFLEX MICROSCOPIC
BACTERIA UA: NONE SEEN
BILIRUBIN URINE: NEGATIVE
Glucose, UA: NEGATIVE mg/dL
KETONES UR: NEGATIVE mg/dL
Leukocytes, UA: NEGATIVE
NITRITE: NEGATIVE
PROTEIN: NEGATIVE mg/dL
Specific Gravity, Urine: 1.015 (ref 1.005–1.030)
pH: 6 (ref 5.0–8.0)

## 2017-09-03 LAB — GLUCOSE, CAPILLARY: Glucose-Capillary: 74 mg/dL (ref 65–99)

## 2017-09-03 LAB — PROTIME-INR
INR: 1.07
Prothrombin Time: 13.8 seconds (ref 11.4–15.2)

## 2017-09-03 MED ORDER — ASPIRIN 81 MG PO CHEW
81.0000 mg | CHEWABLE_TABLET | Freq: Every day | ORAL | Status: DC
  Administered 2017-09-04 – 2017-09-07 (×4): 81 mg via ORAL
  Filled 2017-09-03 (×4): qty 1

## 2017-09-03 MED ORDER — MIDAZOLAM HCL 2 MG/2ML IJ SOLN
INTRAMUSCULAR | Status: AC
  Filled 2017-09-03: qty 2

## 2017-09-03 MED ORDER — LEVOTHYROXINE SODIUM 50 MCG PO TABS
50.0000 ug | ORAL_TABLET | Freq: Every day | ORAL | Status: DC
  Administered 2017-09-03 – 2017-09-07 (×5): 50 ug via ORAL
  Filled 2017-09-03 (×5): qty 1

## 2017-09-03 MED ORDER — COLCHICINE 0.6 MG PO TABS
0.6000 mg | ORAL_TABLET | Freq: Two times a day (BID) | ORAL | Status: DC
  Administered 2017-09-04: 0.6 mg via ORAL
  Filled 2017-09-03: qty 1

## 2017-09-03 MED ORDER — ACETAMINOPHEN 650 MG RE SUPP: 650.0000 mg | Freq: Four times a day (QID) | RECTAL | Status: DC | PRN

## 2017-09-03 MED ORDER — SIMVASTATIN 20 MG PO TABS
20.0000 mg | ORAL_TABLET | Freq: Every day | ORAL | Status: DC
  Administered 2017-09-03 – 2017-09-06 (×4): 20 mg via ORAL
  Filled 2017-09-03 (×4): qty 1

## 2017-09-03 MED ORDER — FENTANYL CITRATE (PF) 100 MCG/2ML IJ SOLN
INTRAMUSCULAR | Status: AC
  Filled 2017-09-03: qty 2

## 2017-09-03 MED ORDER — ONDANSETRON HCL 4 MG/2ML IJ SOLN: 4.0000 mg | Freq: Four times a day (QID) | INTRAMUSCULAR | Status: DC | PRN

## 2017-09-03 MED ORDER — MORPHINE SULFATE (PF) 2 MG/ML IV SOLN: 2.0000 mg | INTRAVENOUS | Status: DC | PRN

## 2017-09-03 MED ORDER — PIPERACILLIN-TAZOBACTAM 3.375 G IVPB
3.3750 g | Freq: Three times a day (TID) | INTRAVENOUS | Status: DC
  Administered 2017-09-03 – 2017-09-07 (×13): 3.375 g via INTRAVENOUS
  Filled 2017-09-03 (×13): qty 50

## 2017-09-03 MED ORDER — IOPAMIDOL (ISOVUE-300) INJECTION 61%
INTRAVENOUS | Status: AC
  Filled 2017-09-03: qty 30

## 2017-09-03 MED ORDER — FENTANYL CITRATE (PF) 100 MCG/2ML IJ SOLN
INTRAMUSCULAR | Status: AC | PRN
  Administered 2017-09-03: 50 ug via INTRAVENOUS

## 2017-09-03 MED ORDER — SODIUM CHLORIDE 0.9% FLUSH
5.0000 mL | Freq: Three times a day (TID) | INTRAVENOUS | Status: DC
  Administered 2017-09-03 – 2017-09-07 (×8): 5 mL via INTRAVENOUS

## 2017-09-03 MED ORDER — MIDAZOLAM HCL 2 MG/2ML IJ SOLN
INTRAMUSCULAR | Status: AC | PRN
  Administered 2017-09-03: 1 mg via INTRAVENOUS

## 2017-09-03 MED ORDER — ONDANSETRON HCL 4 MG PO TABS
4.0000 mg | ORAL_TABLET | Freq: Four times a day (QID) | ORAL | Status: DC | PRN
Start: 2017-09-03 — End: 2017-09-07

## 2017-09-03 MED ORDER — DONEPEZIL HCL 10 MG PO TABS
10.0000 mg | ORAL_TABLET | Freq: Every day | ORAL | Status: DC
  Administered 2017-09-04 – 2017-09-06 (×3): 10 mg via ORAL
  Filled 2017-09-03 (×3): qty 1

## 2017-09-03 MED ORDER — LACTATED RINGERS IV SOLN
INTRAVENOUS | Status: DC
  Administered 2017-09-03 (×2): via INTRAVENOUS
  Administered 2017-09-04: 1000 mL via INTRAVENOUS
  Administered 2017-09-04 – 2017-09-06 (×5): via INTRAVENOUS

## 2017-09-03 MED ORDER — BENAZEPRIL HCL 10 MG PO TABS
40.0000 mg | ORAL_TABLET | Freq: Every day | ORAL | Status: DC
  Administered 2017-09-03: 40 mg via ORAL
  Filled 2017-09-03: qty 4

## 2017-09-03 MED ORDER — ACETAMINOPHEN 325 MG PO TABS: 650.0000 mg | ORAL_TABLET | Freq: Four times a day (QID) | ORAL | Status: DC | PRN

## 2017-09-03 MED ORDER — LIDOCAINE HCL 1 % IJ SOLN
INTRAMUSCULAR | Status: AC | PRN
  Administered 2017-09-03: 5 mL

## 2017-09-03 NOTE — Consult Note (Signed)
Surgical Consultation Requesting provider: Dr. Lorin Mercy  CC: Abdominal wall drainage  HPI: 83yo gentleman with dementia, CKD, vascular disease (stroke, AAA, carotid occlusion) among other comorbidities and history of diverticular abscess treated with a long term (5 years?) percutaneous drain. The drain was removed about a year ago. He has been followed by Dr. Rosendo Gros who last saw him a year ago after his drain had been removed. At that time the patient did not want to have surgery and especially was not willing to have a colostomy. Observation was recommended. He was seen by his PCP yesterday due to purulent drainage from the prior drain site and was referred to the ER. His daughter has noted this for about 2 weeks, but she suspects it has been going on longer. He lives alone and his children check in on him daily and help him medications, bills and some ADLs. He is essentially asymptomatic other than some pain at the site. He is incontinent of urine at baseline, does have some loose stools.   No Known Allergies  Past Medical History:  Diagnosis Date  . Arthritis   . BPH (benign prostatic hyperplasia)   . Carotid artery occlusion   . Dementia   . Dysphagia   . Hyperlipidemia   . Hypertension   . Peripheral vascular disease ([place])   . Stroke [facility])    Right hemispheric CVA  . Umbilical hernia     Past Surgical History:  Procedure Laterality Date  . CAROTID ENDARTERECTOMY  10/06/2007   Right CEA by Dr. Amedeo Plenty  . IR GENERIC HISTORICAL  07/09/2016   IR RADIOLOGIST EVAL & MGMT 07/09/2016 Arne Cleveland, MD [facility] INTERV RAD  . PEG TUBE PLACEMENT  2008   after CVA    History reviewed. No pertinent family history.  Social History   Social History  . Marital status: Widowed    Spouse name: N/A  . Number of children: N/A  . Years of education: N/A   Occupational History  . Retired    Social History Main Topics  . Smoking status: Former Research scientist (life sciences)  . Smokeless tobacco: Never Used   Comment: remote h/o  . Alcohol use No  . Drug use: No  . Sexual activity: Not Asked   Other Topics Concern  . None   Social History Narrative  . None    No current facility-administered medications on file prior to encounter.    Current Outpatient Prescriptions on File Prior to Encounter  Medication Sig Dispense Refill  . aspirin 81 MG tablet Take 81 mg by mouth daily.    . benazepril (LOTENSIN) 40 MG tablet TAKE 1 TABLET BY MOUTH EVERY DAY 90 tablet 1  . clopidogrel (PLAVIX) 75 MG tablet Take 1 tablet (75 mg total) by mouth daily. 90 tablet 1  . Colchicine (MITIGARE) 0.6 MG CAPS Take 1 tablet by mouth 2 (two) times daily. 60 capsule 3  . donepezil (ARICEPT) 10 MG tablet Take 1 tablet (10 mg total) by mouth at bedtime. 90 tablet 3  . levothyroxine (SYNTHROID, LEVOTHROID) 50 MCG tablet TAKE 1 TABLET (50 MCG TOTAL) BY MOUTH DAILY. 90 tablet 1  . simvastatin (ZOCOR) 20 MG tablet Take 20 mg by mouth daily.      Review of Systems: a complete, 10pt review of systems was completed with pertinent positives and negatives as documented in the HPI.   Physical Exam: Vitals:   09/03/17 0138 09/03/17 0545  BP: 139/61 121/60  Pulse: (!) 45 (!) 59  Resp: 18 18  Temp: 98.2 F (36.8 C) 98.1 F (36.7 C)  SpO2: 98% 95%   Gen: Alert, oriented to self, appears comfortable Head: normocephalic, atraumatic, EOMI, anicteric.  Neck: supple without mass or thyromegaly Chest: unlabored respirations, symmetrical wir entry  Cardiovascular: regular rate and rhythm, no pedal edema Abdomen: LLQ fistula with small amount of purulent drainage, surrounding induration/tenderness/ erythema of 3-4cm radius. Abdomen is otherwise nontender, nondistended, no mass or organomegaly Extremities: warm, without edema, no deformities  Neuro: grossly intact, follows commands Psych: appropriate mood and affect, minimal insight Skin: warm and dry. Psoriatic lesions on lower back   CBC Latest Ref Rng & Units 09/02/2017  09/02/2017 07/30/2017  WBC 4.0 - 10.5 K/uL - 8.2 7.5  Hemoglobin 13.0 - 17.0 g/dL 12.6(L) 12.0(L) 12.2(L)  Hematocrit 39.0 - 52.0 % 37.0(L) 37.9(L) 38.2(L)  Platelets 150 - 400 K/uL - 157 249.0    CMP Latest Ref Rng & Units 09/02/2017 09/02/2017 07/30/2017  Glucose 65 - 99 mg/dL 95 94 98  BUN 6 - 20 mg/dL 40(H) 36(H) 43(H)  Creatinine 0.61 - 1.24 mg/dL 2.70(H) 2.74(H) 2.98(H)  Sodium 135 - 145 mmol/L 142 140 141  Potassium 3.5 - 5.1 mmol/L 5.0 5.0 4.9  Chloride 101 - 111 mmol/L 111 110 109  CO2 22 - 32 mmol/L - 23 24  Calcium 8.9 - 10.3 mg/dL - 8.5(L) 8.7  Total Protein 6.5 - 8.1 g/dL - 7.2 7.8  Total Bilirubin 0.3 - 1.2 mg/dL - 0.6 0.5  Alkaline Phos 38 - 126 U/L - 70 65  AST 15 - 41 U/L - 32 15  ALT 17 - 63 U/L - 17 9    Lab Results  Component Value Date   INR 1.02 12/18/2015   INR 1.09 11/17/2014   INR 1.0 10/04/2007    Imaging: CT ABDOMEN AND PELVIS WITHOUT CONTRAST  TECHNIQUE: Multidetector CT imaging of the abdomen and pelvis was performed following the standard protocol without IV contrast.  COMPARISON:  07/09/2016, 07/09/2016, 09/27/2015  FINDINGS: Lower chest: Lung bases demonstrate no acute consolidation or pleural effusion. The heart is nonenlarged.  Hepatobiliary: Stable subcentimeter hypodensity within the dome of the left lobe. Multiple calcified gallstones. No biliary dilatation  Pancreas: Unremarkable. No pancreatic ductal dilatation or surrounding inflammatory changes.  Spleen: Normal in size without focal abnormality.  Adrenals/Urinary Tract: Adrenal glands are within normal limits. Slightly atrophic kidneys with perinephric fat stranding. Bilateral renal cysts. No hydronephrosis. Bladder is thick-walled.  Stomach/Bowel: The stomach is decompressed. No dilated small bowel. Sigmoid colon diverticular disease without acute wall thickening  Vascular/Lymphatic: Aortic atherosclerosis. Re- demonstrated aneurysmal dilatation of the aorto iliac  bifurcation with distal aortic diameter up to 4.0 cm, right iliac artery measuring 2.9 cm and left iliac artery aneurysm measuring 2.5 cm. No significantly enlarged abdominal or pelvic lymph nodes  Reproductive: Slightly enlarged prostate  Other: Negative for free air. Fat in the inguinal canals. There is moderate soft tissue stranding and inflammatory change within the anterior pelvic wall at the site of prior interventional catheter. There is a gas and fluid filled tract extending to the skin. This is contiguous with a gas and fluid collection that measures 4 x 3.2 cm, it is positions between the bladder and the sigmoid colon. It is concerning for a recurrent abscess. Suspect that there is residual fistula between the distal sigmoid colon and the bladder, series 2, image number 67 and series 4, image number 84  Musculoskeletal: Degenerative changes. No acute or suspicious bone lesion  IMPRESSION: 1. 4  x 3.2 cm gas and fluid collection within the anterior pelvis, anterior to the dome of the bladder, concerning for a recurrent abscess. This is contiguous with a fluid and gas filled trach that extends to the skin surface, compatible with a fistula. There is moderate soft tissue stranding and inflammation around the fistulous tract within the anterior pelvic subcutaneous soft tissues. 2. 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 3. Sigmoid colon diverticular disease 4. 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 Recommend followup by ultrasound in 1 year. This recommendation follows ACR consensus guidelines: White Paper of the ACR Incidental Findings Committee II on Vascular Findings. J Am Coll Radiol 2013; 10:789-794. 5. Multiple calcified gallstones   Electronically Signed   By: Donavan Foil M.D.   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 Question of colovesical fistula.  -Recommend NPO, IVF, empiric antibiotics -Recommend IR consultation, urology consultation -Will continue to follow. Ideally would ultimately undergo elective sigmoid colectomy but he is high risk for poor recovery from surgery given his renal, vascular, and neurological disease and possibility of colostomy would have to be acceptable to the patient and family   Romana Juniper, MD [facility], [place] Pager [phone]

## 2017-09-03 NOTE — Progress Notes (Signed)
Patient arrived to floor from [facility] ED.  Skin swarm done with [name], RN.  Psoriasis noted from thoracic to lower back.  Small spot also on lateral left calf.  Scant drainage noted to abdomen with small pink raised area on LLQ of abdomen.  Larger bulging noted under small raised area on abdomen. Patient states he has no needs at this time.

## 2017-09-03 NOTE — Consult Note (Signed)
Urology Consult  CC: Referring physician: Clovis Riley, MD Reason for referral:  Possible fistula to the bladder  Impression/Assessment: 83 y.o. male with multiple medical problems including stroke, dementia, carotid occlusion, AAA who is admitted for recurrent diverticular abscess. Urology consulted due to concern for possible fistulous connection between this abscess and the bladder on CT. UA shows no significant red or white blood cells and no bacteria (no urinalysis findings to suggest the presence of a fistula), culture pending.    Plan:  - Given lack of air in the bladder on imaging, lack of urinary symptoms including fecaluria and/or pneumaturia, and bland UA, I do not feel there is strong evidence of a colovesical fistula.  - Start Flomax 0.4mg  nightly for his urinary symptoms. May continue this on discharge as a long-term medication.  - No urology follow up needed. We will sign off. Please page with any questions or concerns.    History of Present Illness: Robert Barnett is an 83 year old male patient with a history of BPH and a pelvic abscess initially in 11/15.  He has multiple comorbidities including dementia, CVA, AAA, hypertension, hyperlipidemia, carotid artery occlusion, dysphagia and dementia as well as renal insufficiency.  He reported having had an abscess with a long-term drain, removed in 2016. He was seen at that time by Dr. Tresa Moore and was felt to have a possible vesical/abscess fistula that was managed with catheter drainage alone. He underwent a cystogram in 11/2014 after 2 weeks with catheter, which revealed no evidence of fistula, thought previously to be located at the dome of the bladder.  He recently was seen by his primary care physician due to drainage from his lower abdomen that appeared purulent.  He was sent to the emergency room but his daughter had reported he was not having any complaints or discomfort.  She did report he had been experiencing some  incontinence.  He has not had any fever.  He has baseline urinary symptoms, but no recent worsening. He denies fecaluria or pneumaturia. No history of GU malignancy.   Past Medical History:  Diagnosis Date  . Arthritis   . BPH (benign prostatic hyperplasia)   . Carotid artery occlusion   . Dementia   . Dysphagia   . Hyperlipidemia   . Hypertension   . Peripheral vascular disease ([place])   . Stroke [facility])    Right hemispheric CVA  . Umbilical hernia    Past Surgical History:  Procedure Laterality Date  . CAROTID ENDARTERECTOMY  10/06/2007   Right CEA by Dr. Amedeo Plenty  . IR GENERIC HISTORICAL  07/09/2016   IR RADIOLOGIST EVAL & MGMT 07/09/2016 Arne Cleveland, MD [facility] INTERV RAD  . PEG TUBE PLACEMENT  2008   after CVA    Medications:  Prior to Admission:  Prescriptions Prior to Admission  Medication Sig Dispense Refill Last Dose  . aspirin 81 MG tablet Take 81 mg by mouth daily.   09/01/2017 at Unknown time  . benazepril (LOTENSIN) 40 MG tablet TAKE 1 TABLET BY MOUTH EVERY DAY 90 tablet 1 09/01/2017 at Unknown time  . clopidogrel (PLAVIX) 75 MG tablet Take 1 tablet (75 mg total) by mouth daily. 90 tablet 1 09/01/2017 at 0900  . Colchicine (MITIGARE) 0.6 MG CAPS Take 1 tablet by mouth 2 (two) times daily. 60 capsule 3 09/01/2017 at Unknown time  . donepezil (ARICEPT) 10 MG tablet Take 1 tablet (10 mg total) by mouth at bedtime. 90 tablet 3 09/01/2017 at Unknown time  .  levothyroxine (SYNTHROID, LEVOTHROID) 50 MCG tablet TAKE 1 TABLET (50 MCG TOTAL) BY MOUTH DAILY. 90 tablet 1 09/01/2017 at Unknown time  . simvastatin (ZOCOR) 20 MG tablet Take 20 mg by mouth daily.   09/01/2017 at Unknown time    Allergies: No Known Allergies  History reviewed. No pertinent family history.  Social History:  reports that he has quit smoking. He has never used smokeless tobacco. He reports that he does not drink alcohol or use drugs.  Review of Systems (10 point): Pertinent items are noted in HPI. A  comprehensive review of systems was negative except as noted above.  Physical Exam:  Vital signs in last 24 hours: Temp:  [98.1 F (36.7 C)-98.6 F (37 C)] 98.1 F (36.7 C) (09/06 0545) Pulse Rate:  [45-77] 59 (09/06 0545) Resp:  [16-18] 18 (09/06 0545) BP: (120-149)/(60-89) 121/60 (09/06 0545) SpO2:  [95 %-98 %] 95 % (09/06 0545) Weight:  [98.4 kg (217 lb)-99.8 kg (220 lb)] 98.6 kg (217 lb 6 oz) (09/06 0138) General appearance: alert and appears stated age. Cooperative, but not a good historian Head: Normocephalic, without obvious abnormality, atraumatic Eyes: conjunctivae/corneas clear. EOM's intact.  Oropharynx: moist mucous membranes Neck: supple, symmetrical, trachea midline Resp: normal respiratory effort Cardio: regular rate and rhythm Back: No CVA tenderness. GI: Soft, non-tender. Midline lower abdomen firmness and flucuance consistent with abscess, with drainage of pus to skin.  Male genitalia: penis: normal male phallus with no lesions or discharge.  Extremities: extremities normal, atraumatic, no cyanosis or edema Skin: Skin color normal. No visible rashes or lesions Neurologic: No sensory deficits appreciated.   Laboratory Data:   Recent Labs  09/02/17 2232 09/02/17 2245  WBC 8.2  --   HGB 12.0* 12.6*  HCT 37.9* 37.0*   BMET  Recent Labs  09/02/17 2232 09/02/17 2245  NA 140 142  K 5.0 5.0  CL 110 111  CO2 23  --   GLUCOSE 94 95  BUN 36* 40*  CREATININE 2.74* 2.70*  CALCIUM 8.5*  --    No results for input(s): LABPT, INR in the last 72 hours. No results for input(s): LABURIN in the last 72 hours. Results for orders placed or performed during the hospital encounter of 11/24/14  Urine culture     Status: None   Collection Time: 11/24/14  4:13 AM  Result Value Ref Range Status   Specimen Description URINE, CATHETERIZED  Final   Special Requests ADDED [id number]  Final   Culture  Setup Time   Final    11/24/2014 07:58 Performed at [place] Performed at [facility]   Final   Culture NO GROWTH Performed at [facility]   Final   Report Status 11/25/2014 FINAL  Final   Creatinine:  Recent Labs  09/02/17 2232 09/02/17 2245  CREATININE 2.74* 2.70*    Imaging: Ct Abdomen Pelvis Wo Contrast  Result Date: 09/02/2017 CLINICAL DATA:  Abdominal pain with fever EXAM: CT ABDOMEN AND PELVIS WITHOUT CONTRAST TECHNIQUE: Multidetector CT imaging of the abdomen and pelvis was performed following the standard protocol without IV contrast. COMPARISON:  07/09/2016, 07/09/2016, 09/27/2015 FINDINGS: Lower chest: Lung bases demonstrate no acute consolidation or pleural effusion. The heart is nonenlarged. Hepatobiliary: Stable subcentimeter hypodensity within the dome of the left lobe. Multiple calcified gallstones. No biliary dilatation Pancreas: Unremarkable. No pancreatic ductal dilatation or surrounding inflammatory changes. Spleen: Normal in size without focal abnormality. Adrenals/Urinary Tract: Adrenal glands are  within normal limits. Slightly atrophic kidneys with perinephric fat stranding. Bilateral renal cysts. No hydronephrosis. Bladder is thick-walled. Stomach/Bowel: The stomach is decompressed. No dilated small bowel. Sigmoid colon diverticular disease without acute wall thickening Vascular/Lymphatic: Aortic atherosclerosis. 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 No significantly enlarged abdominal or pelvic lymph nodes Reproductive: Slightly enlarged prostate Other: Negative for free air. Fat in the inguinal canals. There is moderate soft tissue stranding and inflammatory change within the anterior pelvic wall at the site of prior interventional catheter. There is a gas and fluid filled tract extending to the skin. This is contiguous  with a gas and fluid collection that measures 4 x 3.2 cm, it is positions between the bladder and the sigmoid colon. It is concerning for a recurrent abscess. Suspect that there is residual fistula between the distal sigmoid colon and the bladder, series 2, image number 67 and series 4, image number 84 Musculoskeletal: Degenerative changes. No acute or suspicious bone lesion IMPRESSION: 1. 4 x 3.2 cm gas and fluid collection within the anterior pelvis, anterior to the dome of the bladder, concerning for a recurrent abscess. This is contiguous with a fluid and gas filled trach that extends to the skin surface, compatible with a fistula. There is moderate soft tissue stranding and inflammation around the fistulous tract within the anterior pelvic subcutaneous soft tissues. 2. 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 3. Sigmoid colon diverticular disease 4. 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 Recommend followup by ultrasound in 1 year. This recommendation follows ACR consensus guidelines: White Paper of the ACR Incidental Findings Committee II on Vascular Findings. J Am Coll Radiol 2013; 10:789-794. 5. Multiple calcified gallstones Electronically Signed   By: Donavan Foil M.D.   On: 09/02/2017 22:52    CT scan images were independently reviewed.   [place] C 09/03/2017, 9:24 AM

## 2017-09-03 NOTE — ED Notes (Signed)
Patient refused medication at this time. States he will let me know when pain is to the point that he feels like he needs to be medicated. Family at the bedside and verbalized understanding as well.

## 2017-09-03 NOTE — Procedures (Signed)
Lower abdominal abscess drain replacement 12 Fr. 30 cc pus Injection of contrast reveals positive fistula to sigmoid colon. No fistula to bladder. EBL 0 Comp 0

## 2017-09-03 NOTE — Progress Notes (Signed)
PROGRESS NOTE  Robert Barnett YQI:[id number] DOB: 07-25-1932 DOA: 09/02/2017 PCP: Janith Lima, MD   LOS: 0 days   Brief Narrative / Interim history: Robert Barnett is a 83 y.o. male with medical history significant of hypertension, hyperlipidemia, prior stroke, dysphagia, BPH, dementia, and remote (11/15) abscess of abdominal cavity involving the dome of the bladder with possible complication from an untreated diverticular abscess with IR drain placed.  He has had no other apparent issues until recently. He was seen by his PCP 9/5 because there was a drain in his abdomen and that area now has purulent drainage; the PCP sent him to the ER.  Daughter reports that he isn't feeling bad.  Daughter thought he was peeing on himself because of the yellow stuff on his t-shirt, but she looked more closely and saw the hole and the yellow stuff coming out.  She saw this last week.  Placed bandages and other stuff on the belly without relief.  CT scan of the abdomen and pelvis done in the emergency room on 9/5 showed 4 x 3.2 cm gas and fluid collection within the anterior pelvis, anterior to the dome of the bladder, concerning for recurrent abscess  Assessment & Plan: Principal Problem:   Abdominal wall abscess Active Problems:   Moderate dementia without behavioral disturbance   Essential hypertension   Colovesical fistula   CKD (chronic kidney disease) stage 4, GFR 15-29 ml/min (HCC)   AAA (abdominal aortic aneurysm) (HCC)   Pelvic abscess -Concern for colovesical fistula.  General surgery, interventional radiology and urology consulted.  For now short-term, will likely need to have the fluid collection drained and sent for cultures -Placed on Zosyn on admission, continue for now  Dementia -Continue Aricept  Hypertension -Discontinue ACE inhibitor as patient has CKD stage IV -Pressure 120/60 this morning, continue to closely monitor, can add Norvasc or hydralazine if needed  Chronic kidney  disease stage IV -Creatinine appears close to baseline.  Discontinue ACE inhibitor  AAA -Stable, needs follow-up ultrasound one year  Hyperlipidemia -Continue Zocor  Hypothyroidism -Continue Synthroid   DVT prophylaxis: SCD Code Status: Full code Family Communication: Discussed with daughter at bedside Disposition Plan: home when ready  Consultants:   General surgery  IR  Urology   Procedures:   Percutaneous drainage 9/6  Antimicrobials:  Zosyn 9/5 >>   Subjective: - no chest pain, shortness of breath, no abdominal pain, nausea or vomiting.   Objective: Vitals:   09/02/17 1958 09/02/17 2259 09/03/17 0138 09/03/17 0545  BP: (!) 147/64 (!) 149/60 139/61 121/60  Pulse: 73 70 (!) 45 (!) 59  Resp: 18 18 18 18   Temp:  98.4 F (36.9 C) 98.2 F (36.8 C) 98.1 F (36.7 C)  TempSrc:  Oral Oral Oral  SpO2: 98% 97% 98% 95%  Weight:   98.6 kg (217 lb 6 oz)   Height:   5\' 6"  (1.676 m)     Intake/Output Summary (Last 24 hours) at 09/03/17 1206 Last data filed at 09/03/17 1100  Gross per 24 hour  Intake           441.67 ml  Output              325 ml  Net           116.67 ml   Filed Weights   09/02/17 1607 09/03/17 0138  Weight: 98.4 kg (217 lb) 98.6 kg (217 lb 6 oz)    Examination:  Vitals:   09/02/17 1958  09/02/17 2259 09/03/17 0138 09/03/17 0545  BP: (!) 147/64 (!) 149/60 139/61 121/60  Pulse: 73 70 (!) 45 (!) 59  Resp: 18 18 18 18   Temp:  98.4 F (36.9 C) 98.2 F (36.8 C) 98.1 F (36.7 C)  TempSrc:  Oral Oral Oral  SpO2: 98% 97% 98% 95%  Weight:   98.6 kg (217 lb 6 oz)   Height:   5\' 6"  (1.676 m)     Constitutional: NAD Eyes: lids and conjunctivae normal Respiratory: clear to auscultation bilaterally, no wheezing, no crackles.  Cardiovascular: Regular rate and rhythm, no murmurs / rubs / gallops. No LE edema. Abdomen: no tenderness. Bowel sounds positive. Fistula apparent lower abdomen draining yellow material Musculoskeletal: no clubbing /  cyanosis.  Skin: no rashes, lesions, ulcers. No induration Neurologic: non focal   Data Reviewed: I have independently reviewed following labs and imaging studies   CBC:  Recent Labs Lab 09/02/17 2232 09/02/17 2245  WBC 8.2  --   NEUTROABS 5.6  --   HGB 12.0* 12.6*  HCT 37.9* 37.0*  MCV 97.7  --   PLT 157  --    Basic Metabolic Panel:  Recent Labs Lab 09/02/17 2232 09/02/17 2245  NA 140 142  K 5.0 5.0  CL 110 111  CO2 23  --   GLUCOSE 94 95  BUN 36* 40*  CREATININE 2.74* 2.70*  CALCIUM 8.5*  --    GFR: Estimated Creatinine Clearance: 23.2 mL/min (A) (by C-G formula based on SCr of 2.7 mg/dL (H)). Liver Function Tests:  Recent Labs Lab 09/02/17 2232  AST 32  ALT 17  ALKPHOS 70  BILITOT 0.6  PROT 7.2  ALBUMIN 3.2*   No results for input(s): LIPASE, AMYLASE in the last 168 hours. No results for input(s): AMMONIA in the last 168 hours. Coagulation Profile: No results for input(s): INR, PROTIME in the last 168 hours. Cardiac Enzymes: No results for input(s): CKTOTAL, CKMB, CKMBINDEX, TROPONINI in the last 168 hours. BNP (last 3 results) No results for input(s): PROBNP in the last 8760 hours. HbA1C: No results for input(s): HGBA1C in the last 72 hours. CBG: No results for input(s): GLUCAP in the last 168 hours. Lipid Profile: No results for input(s): CHOL, HDL, LDLCALC, TRIG, CHOLHDL, LDLDIRECT in the last 72 hours. Thyroid Function Tests: No results for input(s): TSH, T4TOTAL, FREET4, T3FREE, THYROIDAB in the last 72 hours. Anemia Panel: No results for input(s): VITAMINB12, FOLATE, FERRITIN, TIBC, IRON, RETICCTPCT in the last 72 hours. Urine analysis:    Component Value Date/Time   COLORURINE YELLOW 09/03/2017 1034   APPEARANCEUR CLEAR 09/03/2017 1034   LABSPEC 1.015 09/03/2017 1034   PHURINE 6.0 09/03/2017 1034   GLUCOSEU NEGATIVE 09/03/2017 1034   HGBUR SMALL (A) 09/03/2017 [place] 09/03/2017 1034   KETONESUR NEGATIVE  09/03/2017 1034   PROTEINUR NEGATIVE 09/03/2017 1034   UROBILINOGEN 1.0 05/09/2015 0023   NITRITE NEGATIVE 09/03/2017 1034   LEUKOCYTESUR NEGATIVE 09/03/2017 1034   Sepsis Labs: Invalid input(s): PROCALCITONIN, LACTICIDVEN  No results found for this or any previous visit (from the past 240 hour(s)).    Radiology Studies: Ct Abdomen Pelvis Wo Contrast  Result Date: 09/02/2017 CLINICAL DATA:  Abdominal pain with fever EXAM: CT ABDOMEN AND PELVIS WITHOUT CONTRAST TECHNIQUE: Multidetector CT imaging of the abdomen and pelvis was performed following the standard protocol without IV contrast. COMPARISON:  07/09/2016, 07/09/2016, 09/27/2015 FINDINGS: Lower chest: Lung bases demonstrate no acute consolidation or pleural effusion. The heart is nonenlarged.  Hepatobiliary: Stable subcentimeter hypodensity within the dome of the left lobe. Multiple calcified gallstones. No biliary dilatation Pancreas: Unremarkable. No pancreatic ductal dilatation or surrounding inflammatory changes. Spleen: Normal in size without focal abnormality. Adrenals/Urinary Tract: Adrenal glands are within normal limits. Slightly atrophic kidneys with perinephric fat stranding. Bilateral renal cysts. No hydronephrosis. Bladder is thick-walled. Stomach/Bowel: The stomach is decompressed. No dilated small bowel. Sigmoid colon diverticular disease without acute wall thickening Vascular/Lymphatic: Aortic atherosclerosis. 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 No significantly enlarged abdominal or pelvic lymph nodes Reproductive: Slightly enlarged prostate Other: Negative for free air. Fat in the inguinal canals. There is moderate soft tissue stranding and inflammatory change within the anterior pelvic wall at the site of prior interventional catheter. There is a gas and fluid filled  tract extending to the skin. This is contiguous with a gas and fluid collection that measures 4 x 3.2 cm, it is positions between the bladder and the sigmoid colon. It is concerning for a recurrent abscess. Suspect that there is residual fistula between the distal sigmoid colon and the bladder, series 2, image number 67 and series 4, image number 84 Musculoskeletal: Degenerative changes. No acute or suspicious bone lesion IMPRESSION: 1. 4 x 3.2 cm gas and fluid collection within the anterior pelvis, anterior to the dome of the bladder, concerning for a recurrent abscess. This is contiguous with a fluid and gas filled trach that extends to the skin surface, compatible with a fistula. There is moderate soft tissue stranding and inflammation around the fistulous tract within the anterior pelvic subcutaneous soft tissues. 2. 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 3. Sigmoid colon diverticular disease 4. 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 Recommend followup by ultrasound in 1 year. This recommendation follows ACR consensus guidelines: White Paper of the ACR Incidental Findings Committee II on Vascular Findings. J Am Coll Radiol 2013; 10:789-794. 5. Multiple calcified gallstones Electronically Signed   By: Donavan Foil M.D.   On: 09/02/2017 22:52     Scheduled Meds: . aspirin  81 mg Oral Daily  . benazepril  40 mg Oral Daily  . [START ON 09/04/2017] colchicine  0.6 mg Oral BID  . [START ON 09/04/2017] donepezil  10 mg Oral QHS  . levothyroxine  50 mcg Oral Q breakfast  . simvastatin  20 mg Oral q1800   Continuous Infusions: . lactated ringers 100 mL/hr at 09/03/17 0600  . piperacillin-tazobactam (ZOSYN)  IV 3.375 g (09/03/17 0263)    Marzetta Board, MD, PhD [facility] Pager [phone]  If 7PM-7AM, please contact night-coverage [email or web address] Password [email or web address] 09/03/2017, 12:06 PM

## 2017-09-03 NOTE — Progress Notes (Signed)
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 Due to malpositioning, catheter was removed on 07/09/16. Patient declined surgery at the time. He was seen yesterday by his primary M.D. due to pelvic pain/drainage from the prior drain site (2 week duration)  and was referred to the hospital for evaluation. He is currently afebrile, WBC normal; CT abdomen /pelvis yesterday revealed: 1. 4 x 3.2 cm gas and fluid collection within the anterior pelvis, anterior to the dome of the bladder, concerning for a recurrent abscess. This is contiguous with a fluid and gas filled trach that extends to the skin surface, compatible with a fistula. There is moderate soft tissue stranding and inflammation around the fistulous tract within the anterior pelvic subcutaneous soft tissues. 2. 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 3. Sigmoid colon diverticular disease 4. 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 Recommend followup by ultrasound in 1 year. This recommendation follows ACR consensus guidelines: White Paper of the ACR Incidental Findings Committee II on Vascular Findings. J Am Coll Radiol 2013; 10:789-794. 5. Multiple calcified gallstones  Request now received from surgery for CT-guided drainage of recurrent pelvic abscess. Past Medical History:  Diagnosis Date  . Arthritis   . BPH (benign prostatic hyperplasia)   . Carotid artery occlusion   . Dementia   . Dysphagia   . Hyperlipidemia   . Hypertension   . Peripheral vascular disease ([place])   . Stroke [facility])    Right hemispheric CVA  . Umbilical hernia    Past  Surgical History:  Procedure Laterality Date  . CAROTID ENDARTERECTOMY  10/06/2007   Right CEA by Dr. Amedeo Plenty  . IR GENERIC HISTORICAL  07/09/2016   IR RADIOLOGIST EVAL & MGMT 07/09/2016 Arne Cleveland, MD [facility] INTERV RAD  . PEG TUBE PLACEMENT  2008   after CVA     Allergies: Patient has no known allergies.  Medications: Prior to Admission medications   Medication Sig Start Date End Date Taking? Authorizing Provider  aspirin 81 MG tablet Take 81 mg by mouth daily.   Yes [provider]  benazepril (LOTENSIN) 40 MG tablet TAKE 1 TABLET BY MOUTH EVERY DAY 03/30/17  Yes Janith Lima, MD  clopidogrel (PLAVIX) 75 MG tablet Take 1 tablet (75 mg total) by mouth daily. 03/16/17  Yes Janith Lima, MD  Colchicine (MITIGARE) 0.6 MG CAPS Take 1 tablet by mouth 2 (two) times daily. 07/30/17  Yes Janith Lima, MD  donepezil (ARICEPT) 10 MG tablet Take 1 tablet (10 mg total) by mouth at bedtime. 04/29/17  Yes Cameron Sprang, MD  levothyroxine (SYNTHROID, LEVOTHROID) 50 MCG tablet TAKE 1 TABLET (50 MCG TOTAL) BY MOUTH DAILY. 05/29/17  Yes Janith Lima, MD  simvastatin (ZOCOR) 20 MG tablet Take 20 mg by mouth daily.   Yes [provider]     Vital Signs: BP 121/60 (BP Location: Right Arm)   Pulse (!) 59   Temp 98.1 F (36.7 C) (Oral)   Resp 18   Ht 5\' 6"  (1.676 m)   Wt 217 lb 6 oz (98.6 kg)   SpO2 95%   BMI 35.09 kg/m   Physical Exam awake, oriented  to self,  son in room; chest clear to auscultation bilaterally. Heart with slightly bradycardic regular rhythm. Abdomen soft, positive bowel sounds, purulent drainage noted from indurated/ erythematous/tender region in left lower quadrant at previous drain tract. No lower extremity edema.  Imaging: Ct Abdomen Pelvis Wo Contrast  Result Date: 09/02/2017 CLINICAL DATA:  Abdominal pain with fever EXAM: CT ABDOMEN AND PELVIS WITHOUT CONTRAST TECHNIQUE: Multidetector CT imaging of the abdomen and pelvis was performed following the  standard protocol without IV contrast. COMPARISON:  07/09/2016, 07/09/2016, 09/27/2015 FINDINGS: Lower chest: Lung bases demonstrate no acute consolidation or pleural effusion. The heart is nonenlarged. Hepatobiliary: Stable subcentimeter hypodensity within the dome of the left lobe. Multiple calcified gallstones. No biliary dilatation Pancreas: Unremarkable. No pancreatic ductal dilatation or surrounding inflammatory changes. Spleen: Normal in size without focal abnormality. Adrenals/Urinary Tract: Adrenal glands are within normal limits. Slightly atrophic kidneys with perinephric fat stranding. Bilateral renal cysts. No hydronephrosis. Bladder is thick-walled. Stomach/Bowel: The stomach is decompressed. No dilated small bowel. Sigmoid colon diverticular disease without acute wall thickening Vascular/Lymphatic: Aortic atherosclerosis. 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 No significantly enlarged abdominal or pelvic lymph nodes Reproductive: Slightly enlarged prostate Other: Negative for free air. Fat in the inguinal canals. There is moderate soft tissue stranding and inflammatory change within the anterior pelvic wall at the site of prior interventional catheter. There is a gas and fluid filled tract extending to the skin. This is contiguous with a gas and fluid collection that measures 4 x 3.2 cm, it is positions between the bladder and the sigmoid colon. It is concerning for a recurrent abscess. Suspect that there is residual fistula between the distal sigmoid colon and the bladder, series 2, image number 67 and series 4, image number 84 Musculoskeletal: Degenerative changes. No acute or suspicious bone lesion IMPRESSION: 1. 4 x 3.2 cm gas and fluid collection within the anterior pelvis, anterior to the dome of the bladder, concerning for a recurrent  abscess. This is contiguous with a fluid and gas filled trach that extends to the skin surface, compatible with a fistula. There is moderate soft tissue stranding and inflammation around the fistulous tract within the anterior pelvic subcutaneous soft tissues. 2. 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 3. Sigmoid colon diverticular disease 4. 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 Recommend followup by ultrasound in 1 year. This recommendation follows ACR consensus guidelines: White Paper of the ACR Incidental Findings Committee II on Vascular Findings. J Am Coll Radiol 2013; 10:789-794. 5. Multiple calcified gallstones Electronically Signed   By: Donavan Foil M.D.   On: 09/02/2017 22:52    Labs:  CBC:  Recent Labs  07/30/17 1642 09/02/17 2232 09/02/17 2245  WBC 7.5 8.2  --   HGB 12.2* 12.0* 12.6*  HCT 38.2* 37.9* 37.0*  PLT 249.0 157  --     COAGS: No results for input(s): INR, APTT in the last 8760 hours.  BMP:  Recent Labs  07/30/17 1642 09/02/17 2232 09/02/17 2245  NA 141 140 142  K 4.9 5.0 5.0  CL 109 110 111  CO2 24 23  --   GLUCOSE 98 94 95  BUN 43* 36* 40*  CALCIUM 8.7 8.5*  --   CREATININE 2.98* 2.74* 2.70*  GFRNONAA  --  20*  --   GFRAA  --  23*  --     LIVER FUNCTION TESTS:  Recent Labs  07/30/17 1642 09/02/17 2232  BILITOT 0.5 0.6  AST 15 32  ALT 9 17  ALKPHOS 65 70  PROT 7.8 7.2  ALBUMIN 3.4* 3.2*    Assessment and Plan: Patient with history of pelvic pain and drainage from prior pelvic abscess drain insertion site LLQ, evidence of recurrent pelvic abscess on CT; known history of fistula to colon on prior imaging. Request received from surgery for repeat CT guided drainage of pelvic abscess. Imaging studies have been reviewed by Dr. Barbie Banner. Details/risks of procedure, including but not limited to, internal bleeding, infection, injury to adjacent  structures, need for prolonged drainage discussed with patient and family with their understanding and consent. Procedure scheduled for today.   Electronically Signed: D. Rowe Robert, PA-C 09/03/2017, 10:27 AM   I spent a total of  20 minutes at the the patient's bedside AND on the patient's hospital floor or unit, greater than 50% of which was counseling/coordinating care for CT-guided drainage of pelvic abscess    Patient ID: Robert Barnett, male   DOB: 02/02/1932, 83 y.o.   MRN: [id number]

## 2017-09-03 NOTE — Progress Notes (Signed)
Pharmacy Antibiotic Note  Robert Barnett is a 83 y.o. male with recurrent abscess and fistula admitted on 09/02/2017 with intra-abdominal infection.  Pharmacy has been consulted for zosyn dosing.  Plan: Zosyn 3.375g IV q8h (4 hour infusion).  F/u scr  Height: 5\' 6"  (167.6 cm) Weight: 217 lb 6 oz (98.6 kg) IBW/kg (Calculated) : 63.8  Temp (24hrs), Avg:98.4 F (36.9 C), Min:98.2 F (36.8 C), Max:98.6 F (37 C)   Recent Labs Lab 09/02/17 2232 09/02/17 2245  WBC 8.2  --   CREATININE 2.74* 2.70*  LATICACIDVEN  --  1.64    Estimated Creatinine Clearance: 23.2 mL/min (A) (by C-G formula based on SCr of 2.7 mg/dL (H)).    No Known Allergies  Antimicrobials this admission: 9/5 zosyn >>    >>   Dose adjustments this admission:   Microbiology results:  BCx:   UCx:    Sputum:    MRSA PCR:   Thank you for allowing pharmacy to be a part of this patient's care.  Dorrene German 09/03/2017 2:18 AM

## 2017-09-04 LAB — CBC
HEMATOCRIT: 34.3 % — AB (ref 39.0–52.0)
Hemoglobin: 11 g/dL — ABNORMAL LOW (ref 13.0–17.0)
MCH: 31 pg (ref 26.0–34.0)
MCHC: 32.1 g/dL (ref 30.0–36.0)
MCV: 96.6 fL (ref 78.0–100.0)
Platelets: 137 10*3/uL — ABNORMAL LOW (ref 150–400)
RBC: 3.55 MIL/uL — ABNORMAL LOW (ref 4.22–5.81)
RDW: 14.4 % (ref 11.5–15.5)
WBC: 5.1 10*3/uL (ref 4.0–10.5)

## 2017-09-04 LAB — URINE CULTURE: Culture: NO GROWTH

## 2017-09-04 LAB — BASIC METABOLIC PANEL
Anion gap: 8 (ref 5–15)
BUN: 26 mg/dL — AB (ref 6–20)
CALCIUM: 8.4 mg/dL — AB (ref 8.9–10.3)
CHLORIDE: 107 mmol/L (ref 101–111)
CO2: 24 mmol/L (ref 22–32)
Creatinine, Ser: 2.36 mg/dL — ABNORMAL HIGH (ref 0.61–1.24)
GFR calc Af Amer: 28 mL/min — ABNORMAL LOW (ref >60)
GFR, EST NON AFRICAN AMERICAN: 24 mL/min — AB (ref >60)
GLUCOSE: 74 mg/dL (ref 65–99)
Potassium: 4.2 mmol/L (ref 3.5–5.1)
SODIUM: 139 mmol/L (ref 135–145)

## 2017-09-04 MED ORDER — COLCHICINE 0.6 MG PO TABS
0.3000 mg | ORAL_TABLET | Freq: Every day | ORAL | Status: DC
  Administered 2017-09-05 – 2017-09-07 (×3): 0.3 mg via ORAL
  Filled 2017-09-04 (×3): qty 1

## 2017-09-04 NOTE — Progress Notes (Signed)
[facility] Progress Note     Subjective: CC:  Daughter at bedside reporting a 70 of patient history 2/2 patients dementia. Patient denies abdominal pain, nausea, or vomiting. Had a BM today.   Objective: Vital signs in last 24 hours: Temp:  [98.2 F (36.8 C)-98.4 F (36.9 C)] 98.4 F (36.9 C) (09/06 2100) Pulse Rate:  [46-70] 46 (09/06 2100) Resp:  [12-22] 20 (09/06 2100) BP: (104-142)/(53-94) 135/65 (09/06 2100) SpO2:  [90 %-100 %] 100 % (09/06 2100)    Intake/Output from previous day: 09/06 0701 - 09/07 0700 In: 2230 [P.O.:120; I.V.:2005; IV Piggyback:100] Out: 575 [Urine:550; Drains:25] Intake/Output this shift: No intake/output data recorded.  PE: Gen:  Alert, NAD Card:  Regular rate and rhythm, pedal pulses 2+ BL Pulm:  Normal effort, clear to auscultation bilaterally Abd: Soft, appropriately tender LLQ, non-distended, bowel sounds present in all 4 quadrants, LLQ drain c/d/i with dried sanguinous drainage on dressing. Drain with purulent/sanguinous drainage. Skin: warm and dry, no rashes  Psych: A&Ox3   Lab Results:   Recent Labs  09/02/17 2232 09/02/17 2245 09/04/17 0533  WBC 8.2  --  5.1  HGB 12.0* 12.6* 11.0*  HCT 37.9* 37.0* 34.3*  PLT 157  --  137*   BMET  Recent Labs  09/02/17 2232 09/02/17 2245 09/04/17 0533  NA 140 142 139  K 5.0 5.0 4.2  CL 110 111 107  CO2 23  --  24  GLUCOSE 94 95 74  BUN 36* 40* 26*  CREATININE 2.74* 2.70* 2.36*  CALCIUM 8.5*  --  8.4*   PT/INR  Recent Labs  09/03/17 1153  LABPROT 13.8  INR 1.07   CMP     Component Value Date/Time   NA 139 09/04/2017 0533   K 4.2 09/04/2017 0533   CL 107 09/04/2017 0533   CO2 24 09/04/2017 0533   GLUCOSE 74 09/04/2017 0533   BUN 26 (H) 09/04/2017 0533   CREATININE 2.36 (H) 09/04/2017 0533   CREATININE 1.88 (H) 09/18/2015 0919   CALCIUM 8.4 (L) 09/04/2017 0533   PROT 7.2 09/02/2017 2232   ALBUMIN 3.2 (L) 09/02/2017 2232   AST 32 09/02/2017 2232    ALT 17 09/02/2017 2232   ALKPHOS 70 09/02/2017 2232   BILITOT 0.6 09/02/2017 2232   GFRNONAA 24 (L) 09/04/2017 0533   GFRNONAA 33 (L) 09/18/2015 0919   GFRAA 28 (L) 09/04/2017 0533   GFRAA 38 (L) 09/18/2015 0919   Lipase     Component Value Date/Time   LIPASE 88 (H) 11/24/2014 0147       Studies/Results: Ct Abdomen Pelvis Wo Contrast  Result Date: 09/02/2017 CLINICAL DATA:  Abdominal pain with fever EXAM: CT ABDOMEN AND PELVIS WITHOUT CONTRAST TECHNIQUE: Multidetector CT imaging of the abdomen and pelvis was performed following the standard protocol without IV contrast. COMPARISON:  07/09/2016, 07/09/2016, 09/27/2015 FINDINGS: Lower chest: Lung bases demonstrate no acute consolidation or pleural effusion. The heart is nonenlarged. Hepatobiliary: Stable subcentimeter hypodensity within the dome of the left lobe. Multiple calcified gallstones. No biliary dilatation Pancreas: Unremarkable. No pancreatic ductal dilatation or surrounding inflammatory changes. Spleen: Normal in size without focal abnormality. Adrenals/Urinary Tract: Adrenal glands are within normal limits. Slightly atrophic kidneys with perinephric fat stranding. Bilateral renal cysts. No hydronephrosis. Bladder is thick-walled. Stomach/Bowel: The stomach is decompressed. No dilated small bowel. Sigmoid colon diverticular disease without acute wall thickening Vascular/Lymphatic: Aortic atherosclerosis. Re- demonstrated aneurysmal dilatation of the aorto iliac bifurcation with distal aortic diameter up to 4.0 cm, right  iliac artery measuring 2.9 cm and left iliac artery aneurysm measuring 2.5 cm. No significantly enlarged abdominal or pelvic lymph nodes Reproductive: Slightly enlarged prostate Other: Negative for free air. Fat in the inguinal canals. There is moderate soft tissue stranding and inflammatory change within the anterior pelvic wall at the site of prior interventional catheter. There is a gas and fluid filled tract extending  to the skin. This is contiguous with a gas and fluid collection that measures 4 x 3.2 cm, it is positions between the bladder and the sigmoid colon. It is concerning for a recurrent abscess. Suspect that there is residual fistula between the distal sigmoid colon and the bladder, series 2, image number 67 and series 4, image number 84 Musculoskeletal: Degenerative changes. No acute or suspicious bone lesion IMPRESSION: 1. 4 x 3.2 cm gas and fluid collection within the anterior pelvis, anterior to the dome of the bladder, concerning for a recurrent abscess. This is contiguous with a fluid and gas filled trach that extends to the skin surface, compatible with a fistula. There is moderate soft tissue stranding and inflammation around the fistulous tract within the anterior pelvic subcutaneous soft tissues. 2. 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 3. Sigmoid colon diverticular disease 4. 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 Recommend followup by ultrasound in 1 year. This recommendation follows ACR consensus guidelines: White Paper of the ACR Incidental Findings Committee II on Vascular Findings. J Am Coll Radiol 2013; 10:789-794. 5. Multiple calcified gallstones Electronically Signed   By: Donavan Foil M.D.   On: 09/02/2017 22:52   Ct Image Guided Drainage By Percutaneous Catheter  Result Date: 09/03/2017 INDICATION: Pelvic abscess at the dome of the liver EXAM: CT GUIDED DRAINAGE OF PELVIC ABSCESS. CONTRAST INJECTION TO EVALUATE FOR FISTULA. MEDICATIONS: The patient is currently admitted to the hospital and receiving intravenous antibiotics. The antibiotics were administered within an appropriate time frame prior to the initiation of the procedure. ANESTHESIA/SEDATION: One mg IV Versed 150 mcg IV Fentanyl Moderate Sedation Time:  15 The patient was continuously monitored during the procedure by the  interventional radiology nurse under my direct supervision. COMPLICATIONS: None immediate. TECHNIQUE: Informed written consent was obtained from the patient after a thorough discussion of the procedural risks, benefits and alternatives. All questions were addressed. Maximal Sterile Barrier Technique was utilized including caps, mask, sterile gowns, sterile gloves, sterile drape, hand hygiene and skin antiseptic. A timeout was performed prior to the initiation of the procedure. PROCEDURE: The lower abdomen was prepped with Betadine in a sterile fashion, and a sterile drape was applied covering the operative field. A sterile gown and sterile gloves were used for the procedure. Local anesthesia was provided with 1% Lidocaine. An 8 [place] dilator was advanced into the draining fistula at the lower abdominal location. A Bentson wire was advanced through the dilator and into the fluid collection. The dilator was advanced over the Bentson into the fluid collection. The Bentson was then exchanged for an Amplatz wire. A 12 French drain was then advanced over the Amplatz and coiled in the fluid collection. It was looped and string fixed then sewn to the skin. 15 cc pus was aspirated. 50 cc mixture of Isovue 300 and saline were then injected. FINDINGS: Imaging confirms replacement of the 59 French drain in the pelvic abscess at the dome of the liver. After injecting contrast, contrast was noted to fill a fistulous  tract as well as the adjacent sigmoid colon compatible with a fistula to the colon. There is no evidence of contrast in the bladder to suggest a fistula to the bladder. 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 There is no evidence of fistula to the bladder. Electronically Signed   By: Marybelle Killings M.D.   On: 09/03/2017 15:20    Anti-infectives: Anti-infectives    Start     Dose/Rate Route Frequency Ordered Stop    09/03/17 0500  piperacillin-tazobactam (ZOSYN) IVPB 3.375 g     3.375 g 12.5 mL/hr over 240 Minutes Intravenous Every 8 hours 09/03/17 0219     09/02/17 2215  piperacillin-tazobactam (ZOSYN) IVPB 3.375 g     3.375 g 100 mL/hr over 30 Minutes Intravenous  Once 09/02/17 2208 09/02/17 2306     Assessment/Plan Pelvic abscess w/ Colocutaneous fistula - s/p placement 39F perc drain 9/6 through previous drain tract Dr. Barbie Banner; follow CX. GS w/ GNR, few gram positive rods, few gram positive cocci. Continue IV abx.  Question of colovesical fistula - urology evaluated and low concern for CV fistula, signed off. PMH sigmoid diverticulitis with abscess - treated with long term perc drain placement; patient denied surgery (particularly a colostomy).   Dementia HTN CKD IV AAA Hypothyroidism  FEN: HH/carb mod ID: Zosyn 9/6 >> VTE: SCD's, ok to start chemical VTE from surgical perspective. Follow up: will require F/U in [facility] surgical office [facility] or [place]) and drain clinic.   Plan: follow cultures and plan for discharge home in 1-2 days with drain in place and appropriate PO abx.    LOS: 1 day    [facility] 09/04/2017, 7:26 AM Pager: [phone] Consults: [phone] Mon-Fri 7:00 am-4:30 pm Sat-Sun 7:00 am-11:30 am

## 2017-09-04 NOTE — Progress Notes (Addendum)
Patient awakened from sleep confused and stating that he wants to go home, nurse tech was offering help to clean him up but patient refused. Patient was then reorient and questions has been answered, pt got back in bed . We will continue to monitor

## 2017-09-04 NOTE — Progress Notes (Signed)
Referring Physician(s): [name],C  Supervising Physician: Arne Cleveland  Patient Status:  [facility] - In-pt  Chief Complaint:  Recurrent pelvic abscess  Subjective: Pt doing fair; still has some LLQ discomfort; denies N/V  Allergies: Patient has no known allergies.  Medications: Prior to Admission medications   Medication Sig Start Date End Date Taking? Authorizing Provider  aspirin 81 MG tablet Take 81 mg by mouth daily.   Yes [provider]  benazepril (LOTENSIN) 40 MG tablet TAKE 1 TABLET BY MOUTH EVERY DAY 03/30/17  Yes Janith Lima, MD  clopidogrel (PLAVIX) 75 MG tablet Take 1 tablet (75 mg total) by mouth daily. 03/16/17  Yes Janith Lima, MD  Colchicine (MITIGARE) 0.6 MG CAPS Take 1 tablet by mouth 2 (two) times daily. 07/30/17  Yes Janith Lima, MD  donepezil (ARICEPT) 10 MG tablet Take 1 tablet (10 mg total) by mouth at bedtime. 04/29/17  Yes Cameron Sprang, MD  levothyroxine (SYNTHROID, LEVOTHROID) 50 MCG tablet TAKE 1 TABLET (50 MCG TOTAL) BY MOUTH DAILY. 05/29/17  Yes Janith Lima, MD  simvastatin (ZOCOR) 20 MG tablet Take 20 mg by mouth daily.   Yes [provider]     Vital Signs: BP 135/65 (BP Location: Left Arm)   Pulse (!) 46   Temp 98.4 F (36.9 C) (Oral)   Resp 20   Ht 5\' 6"  (1.676 m)   Wt 217 lb 6 oz (98.6 kg)   SpO2 100%   BMI 35.09 kg/m   Physical Exam LLQ drain intact, small amount of drainage noted from around insertion site, output 25 mL reddish-brown fluid with air in bag. Site mild to moderately tender. Drain irrigated without difficulty.  Imaging: Ct Abdomen Pelvis Wo Contrast  Result Date: 09/02/2017 CLINICAL DATA:  Abdominal pain with fever EXAM: CT ABDOMEN AND PELVIS WITHOUT CONTRAST TECHNIQUE: Multidetector CT imaging of the abdomen and pelvis was performed following the standard protocol without IV contrast. COMPARISON:  07/09/2016, 07/09/2016, 09/27/2015 FINDINGS: Lower chest: Lung bases demonstrate no acute  consolidation or pleural effusion. The heart is nonenlarged. Hepatobiliary: Stable subcentimeter hypodensity within the dome of the left lobe. Multiple calcified gallstones. No biliary dilatation Pancreas: Unremarkable. No pancreatic ductal dilatation or surrounding inflammatory changes. Spleen: Normal in size without focal abnormality. Adrenals/Urinary Tract: Adrenal glands are within normal limits. Slightly atrophic kidneys with perinephric fat stranding. Bilateral renal cysts. No hydronephrosis. Bladder is thick-walled. Stomach/Bowel: The stomach is decompressed. No dilated small bowel. Sigmoid colon diverticular disease without acute wall thickening Vascular/Lymphatic: Aortic atherosclerosis. 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 No significantly enlarged abdominal or pelvic lymph nodes Reproductive: Slightly enlarged prostate Other: Negative for free air. Fat in the inguinal canals. There is moderate soft tissue stranding and inflammatory change within the anterior pelvic wall at the site of prior interventional catheter. There is a gas and fluid filled tract extending to the skin. This is contiguous with a gas and fluid collection that measures 4 x 3.2 cm, it is positions between the bladder and the sigmoid colon. It is concerning for a recurrent abscess. Suspect that there is residual fistula between the distal sigmoid colon and the bladder, series 2, image number 67 and series 4, image number 84 Musculoskeletal: Degenerative changes. No acute or suspicious bone lesion IMPRESSION: 1. 4 x 3.2 cm gas and fluid collection within the anterior  pelvis, anterior to the dome of the bladder, concerning for a recurrent abscess. This is contiguous with a fluid and gas filled trach that extends to the skin surface, compatible with a fistula. There is moderate soft  tissue stranding and inflammation around the fistulous tract within the anterior pelvic subcutaneous soft tissues. 2. 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 3. Sigmoid colon diverticular disease 4. 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 Recommend followup by ultrasound in 1 year. This recommendation follows ACR consensus guidelines: White Paper of the ACR Incidental Findings Committee II on Vascular Findings. J Am Coll Radiol 2013; 10:789-794. 5. Multiple calcified gallstones Electronically Signed   By: Donavan Foil M.D.   On: 09/02/2017 22:52   Ct Image Guided Drainage By Percutaneous Catheter  Result Date: 09/03/2017 INDICATION: Pelvic abscess at the dome of the liver EXAM: CT GUIDED DRAINAGE OF PELVIC ABSCESS. CONTRAST INJECTION TO EVALUATE FOR FISTULA. MEDICATIONS: The patient is currently admitted to the hospital and receiving intravenous antibiotics. The antibiotics were administered within an appropriate time frame prior to the initiation of the procedure. ANESTHESIA/SEDATION: One mg IV Versed 150 mcg IV Fentanyl Moderate Sedation Time:  15 The patient was continuously monitored during the procedure by the interventional radiology nurse under my direct supervision. COMPLICATIONS: None immediate. TECHNIQUE: Informed written consent was obtained from the patient after a thorough discussion of the procedural risks, benefits and alternatives. All questions were addressed. Maximal Sterile Barrier Technique was utilized including caps, mask, sterile gowns, sterile gloves, sterile drape, hand hygiene and skin antiseptic. A timeout was performed prior to the initiation of the procedure. PROCEDURE: The lower abdomen was prepped with Betadine in a sterile fashion, and a sterile drape was applied covering the operative field. A sterile gown and sterile gloves were used for the procedure. Local anesthesia was  provided with 1% Lidocaine. An 8 [place] dilator was advanced into the draining fistula at the lower abdominal location. A Bentson wire was advanced through the dilator and into the fluid collection. The dilator was advanced over the Bentson into the fluid collection. The Bentson was then exchanged for an Amplatz wire. A 12 French drain was then advanced over the Amplatz and coiled in the fluid collection. It was looped and string fixed then sewn to the skin. 15 cc pus was aspirated. 50 cc mixture of Isovue 300 and saline were then injected. FINDINGS: Imaging confirms replacement of the 23 French drain in the pelvic abscess at the dome of the liver. 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 There is no evidence of contrast in the bladder to suggest a fistula to the bladder. 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 There is no evidence of fistula to the bladder. Electronically Signed   By: Marybelle Killings M.D.   On: 09/03/2017 15:20    Labs:  CBC:  Recent Labs  07/30/17 1642 09/02/17 2232 09/02/17 2245 09/04/17 0533  WBC 7.5 8.2  --  5.1  HGB 12.2* 12.0* 12.6* 11.0*  HCT 38.2* 37.9* 37.0* 34.3*  PLT 249.0 157  --  137*    COAGS:  Recent Labs  09/03/17 1153  INR 1.07    BMP:  Recent Labs  07/30/17 1642 09/02/17 2232 09/02/17 2245 09/04/17  0533  NA 141 140 142 139  K 4.9 5.0 5.0 4.2  CL 109 110 111 107  CO2 24 23  --  24  GLUCOSE 98 94 95 74  BUN 43* 36* 40* 26*  CALCIUM 8.7 8.5*  --  8.4*  CREATININE 2.98* 2.74* 2.70* 2.36*  GFRNONAA  --  20*  --  24*  GFRAA  --  23*  --  28*    LIVER FUNCTION TESTS:  Recent Labs  07/30/17 1642 09/02/17 2232  BILITOT 0.5 0.6  AST 15 32  ALT 9 17  ALKPHOS 65 70  PROT 7.8 7.2  ALBUMIN 3.4* 3.2*    Assessment and Plan: Patient with  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 Afebrile, WBC 5.1, hemoglobin 11, creatinine 2.36 (2.70), drain fluid cultures pending; continue drain irrigation; check final culture/sensitiviies; recommend follow-up CT scan within one week of drain placement (as OP at drain clinic or IP)-will also need drain injection prior to consideration of removal; other plans per [facility] and IM   Electronically Signed: D. Rowe Robert, PA-C 09/04/2017, 12:37 PM   I spent a total of 15 minutes at the the patient's bedside AND on the patient's hospital floor or unit, greater than 50% of which was counseling/coordinating care for pelvic drain    Patient ID: Robert Barnett, male   DOB: 01/07/1932, 83 y.o.   MRN: [id number]

## 2017-09-04 NOTE — Progress Notes (Signed)
PROGRESS NOTE  Robert Barnett XHB:[id number] DOB: 06-03-1932 DOA: 09/02/2017 PCP: Janith Lima, MD   LOS: 1 day   Brief Narrative / Interim history: Robert Barnett is a 83 y.o. male with medical history significant of hypertension, hyperlipidemia, prior stroke, dysphagia, BPH, dementia, and remote (11/15) abscess of abdominal cavity involving the dome of the bladder with possible complication from an untreated diverticular abscess with IR drain placed.  He has had no other apparent issues until recently. He was seen by his PCP 9/5 because there was a drain in his abdomen and that area now has purulent drainage; the PCP sent him to the ER.  Daughter reports that he isn't feeling bad.  Daughter thought he was peeing on himself because of the yellow stuff on his t-shirt, but she looked more closely and saw the hole and the yellow stuff coming out.  She saw this last week.  Placed bandages and other stuff on the belly without relief.  CT scan of the abdomen and pelvis done in the emergency room on 9/5 showed 4 x 3.2 cm gas and fluid collection within the anterior pelvis, anterior to the dome of the bladder, concerning for recurrent abscess  Assessment & Plan: Principal Problem:   Abdominal wall abscess Active Problems:   Moderate dementia without behavioral disturbance   Essential hypertension   Colovesical fistula   CKD (chronic kidney disease) stage 4, GFR 15-29 ml/min (HCC)   AAA (abdominal aortic aneurysm) (HCC)   Pelvic abscess -Concern for colovesical fistula.  General surgery, interventional radiology and urology consulted.  -Interventional radiology drained fluid collection on 9/6, drain remained in place -Placed on Zosyn on admission, continue for now, presented to microbiology with gram-negative rods, gram-positive rods and gram-positive cocci.  Final speciation pending  Dementia -Continue Aricept  Hypertension -Discontinue ACE inhibitor as patient has CKD stage IV -Continue to  monitor, stable for now  Chronic kidney disease stage IV -Creatinine appears close to baseline.  Discontinue ACE inhibitor -Improved today to 2.36 from 2.7 yesterday  AAA -Stable, needs follow-up ultrasound one year  Hyperlipidemia -Continue Zocor  Hypothyroidism -Continue Synthroid   DVT prophylaxis: SCD Code Status: Full code Family Communication: Discussed with daughter at bedside Disposition Plan: home when ready, likely 24-48 hours pending microbiology results from abscess drain  Consultants:   General surgery  IR  Urology   Procedures:   Percutaneous drainage 9/6  Antimicrobials:  Zosyn 9/5 >>   Subjective: -Feels well, denies any chest pain, denies any shortness of breath.  Has no abdominal pain, nausea or vomiting.  Objective: Vitals:   09/03/17 1300 09/03/17 1304 09/03/17 1400 09/03/17 2100  BP: (!) 124/53 104/67 (!) 138/94 135/65  Pulse: (!) 58 61 70 (!) 46  Resp: 17 (!) 22 18 20   Temp:   98.2 F (36.8 C) 98.4 F (36.9 C)  TempSrc:   Oral Oral  SpO2: 91% 99% 100% 100%  Weight:      Height:        Intake/Output Summary (Last 24 hours) at 09/04/17 1210 Last data filed at 09/04/17 1100  Gross per 24 hour  Intake             2285 ml  Output              825 ml  Net             1460 ml   Filed Weights   09/02/17 1607 09/03/17 0138  Weight: 98.4 kg (217 lb)  98.6 kg (217 lb 6 oz)    Examination:  Vitals:   09/03/17 1300 09/03/17 1304 09/03/17 1400 09/03/17 2100  BP: (!) 124/53 104/67 (!) 138/94 135/65  Pulse: (!) 58 61 70 (!) 46  Resp: 17 (!) 22 18 20   Temp:   98.2 F (36.8 C) 98.4 F (36.9 C)  TempSrc:   Oral Oral  SpO2: 91% 99% 100% 100%  Weight:      Height:        Constitutional: NAD Eyes: lids and conjunctivae normal Neck: normal, supple Respiratory: clear to auscultation bilaterally, no wheezing, no crackles. Normal respiratory effort.  Cardiovascular: Regular rate and rhythm, no murmurs / rubs / gallops. No LE edema. 2+  pedal pulses.  Abdomen: No tenderness, drain in place Skin: no rashes, lesions, ulcers. No induration Neurologic: non focal   Data Reviewed: I have independently reviewed following labs and imaging studies   CBC:  Recent Labs Lab 09/02/17 2232 09/02/17 2245 09/04/17 0533  WBC 8.2  --  5.1  NEUTROABS 5.6  --   --   HGB 12.0* 12.6* 11.0*  HCT 37.9* 37.0* 34.3*  MCV 97.7  --  96.6  PLT 157  --  308*   Basic Metabolic Panel:  Recent Labs Lab 09/02/17 2232 09/02/17 2245 09/04/17 0533  NA 140 142 139  K 5.0 5.0 4.2  CL 110 111 107  CO2 23  --  24  GLUCOSE 94 95 74  BUN 36* 40* 26*  CREATININE 2.74* 2.70* 2.36*  CALCIUM 8.5*  --  8.4*   GFR: Estimated Creatinine Clearance: 26.5 mL/min (A) (by C-G formula based on SCr of 2.36 mg/dL (H)). Liver Function Tests:  Recent Labs Lab 09/02/17 2232  AST 32  ALT 17  ALKPHOS 70  BILITOT 0.6  PROT 7.2  ALBUMIN 3.2*   No results for input(s): LIPASE, AMYLASE in the last 168 hours. No results for input(s): AMMONIA in the last 168 hours. Coagulation Profile:  Recent Labs Lab 09/03/17 1153  INR 1.07   Cardiac Enzymes: No results for input(s): CKTOTAL, CKMB, CKMBINDEX, TROPONINI in the last 168 hours. BNP (last 3 results) No results for input(s): PROBNP in the last 8760 hours. HbA1C: No results for input(s): HGBA1C in the last 72 hours. CBG:  Recent Labs Lab 09/03/17 2107  GLUCAP 74   Lipid Profile: No results for input(s): CHOL, HDL, LDLCALC, TRIG, CHOLHDL, LDLDIRECT in the last 72 hours. Thyroid Function Tests: No results for input(s): TSH, T4TOTAL, FREET4, T3FREE, THYROIDAB in the last 72 hours. Anemia Panel: No results for input(s): VITAMINB12, FOLATE, FERRITIN, TIBC, IRON, RETICCTPCT in the last 72 hours. Urine analysis:    Component Value Date/Time   COLORURINE YELLOW 09/03/2017 1034   APPEARANCEUR CLEAR 09/03/2017 1034   LABSPEC 1.015 09/03/2017 1034   PHURINE 6.0 09/03/2017 1034   GLUCOSEU  NEGATIVE 09/03/2017 1034   HGBUR SMALL (A) 09/03/2017 [place] 09/03/2017 1034   KETONESUR NEGATIVE 09/03/2017 1034   PROTEINUR NEGATIVE 09/03/2017 1034   UROBILINOGEN 1.0 05/09/2015 0023   NITRITE NEGATIVE 09/03/2017 1034   LEUKOCYTESUR NEGATIVE 09/03/2017 1034   Sepsis Labs: Invalid input(s): PROCALCITONIN, LACTICIDVEN  Recent Results (from the past 240 hour(s))  Culture, Urine     Status: None   Collection Time: 09/03/17 10:34 AM  Result Value Ref Range Status   Specimen Description URINE, CLEAN CATCH  Final   Special Requests NONE  Final   Culture   Final    NO GROWTH Performed  at [facility], [place] [address] [place], [place] [address]    Report Status 09/04/2017 FINAL  Final  Aerobic/Anaerobic Culture (surgical/deep wound)     Status: None (Preliminary result)   Collection Time: 09/03/17  1:22 PM  Result Value Ref Range Status   Specimen Description ABDOMEN  Final   Special Requests NONE  Final   Gram Stain   Final    ABUNDANT WBC PRESENT, PREDOMINANTLY PMN MODERATE GRAM NEGATIVE RODS FEW GRAM POSITIVE RODS FEW GRAM POSITIVE COCCI    Culture   Final    CULTURE REINCUBATED FOR BETTER GROWTH Performed at [facility], [place] [address] [place], [place] [address]    Report Status PENDING  Incomplete      Radiology Studies: Ct Abdomen Pelvis Wo Contrast  Result Date: 09/02/2017 CLINICAL DATA:  Abdominal pain with fever EXAM: CT ABDOMEN AND PELVIS WITHOUT CONTRAST TECHNIQUE: Multidetector CT imaging of the abdomen and pelvis was performed following the standard protocol without IV contrast. COMPARISON:  07/09/2016, 07/09/2016, 09/27/2015 FINDINGS: Lower chest: Lung bases demonstrate no acute consolidation or pleural effusion. The heart is nonenlarged. Hepatobiliary: Stable subcentimeter hypodensity within the dome of the left lobe. Multiple calcified gallstones. No biliary dilatation Pancreas: Unremarkable. No pancreatic ductal dilatation or  surrounding inflammatory changes. Spleen: Normal in size without focal abnormality. Adrenals/Urinary Tract: Adrenal glands are within normal limits. Slightly atrophic kidneys with perinephric fat stranding. Bilateral renal cysts. No hydronephrosis. Bladder is thick-walled. Stomach/Bowel: The stomach is decompressed. No dilated small bowel. Sigmoid colon diverticular disease without acute wall thickening Vascular/Lymphatic: Aortic atherosclerosis. 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 No significantly enlarged abdominal or pelvic lymph nodes Reproductive: Slightly enlarged prostate Other: Negative for free air. Fat in the inguinal canals. There is moderate soft tissue stranding and inflammatory change within the anterior pelvic wall at the site of prior interventional catheter. There is a gas and fluid filled tract extending to the skin. This is contiguous with a gas and fluid collection that measures 4 x 3.2 cm, it is positions between the bladder and the sigmoid colon. It is concerning for a recurrent abscess. Suspect that there is residual fistula between the distal sigmoid colon and the bladder, series 2, image number 67 and series 4, image number 84 Musculoskeletal: Degenerative changes. No acute or suspicious bone lesion IMPRESSION: 1. 4 x 3.2 cm gas and fluid collection within the anterior pelvis, anterior to the dome of the bladder, concerning for a recurrent abscess. This is contiguous with a fluid and gas filled trach that extends to the skin surface, compatible with a fistula. There is moderate soft tissue stranding and inflammation around the fistulous tract within the anterior pelvic subcutaneous soft tissues. 2. 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 3. Sigmoid colon diverticular disease 4. Stable  fusiform aneurysm at the aorto iliac bifurcation with distal aorta measuring up to 4 cm. Recommend followup by ultrasound in 1 year. This recommendation follows ACR consensus guidelines: White Paper of the ACR Incidental Findings Committee II on Vascular Findings. J Am Coll Radiol 2013; 10:789-794. 5. Multiple calcified gallstones Electronically Signed   By: Donavan Foil M.D.   On: 09/02/2017 22:52   Ct Image Guided Drainage By Percutaneous Catheter  Result Date: 09/03/2017 INDICATION: Pelvic abscess at the dome of the liver EXAM: CT  GUIDED DRAINAGE OF PELVIC ABSCESS. CONTRAST INJECTION TO EVALUATE FOR FISTULA. MEDICATIONS: The patient is currently admitted to the hospital and receiving intravenous antibiotics. The antibiotics were administered within an appropriate time frame prior to the initiation of the procedure. ANESTHESIA/SEDATION: One mg IV Versed 150 mcg IV Fentanyl Moderate Sedation Time:  15 The patient was continuously monitored during the procedure by the interventional radiology nurse under my direct supervision. COMPLICATIONS: None immediate. TECHNIQUE: Informed written consent was obtained from the patient after a thorough discussion of the procedural risks, benefits and alternatives. All questions were addressed. Maximal Sterile Barrier Technique was utilized including caps, mask, sterile gowns, sterile gloves, sterile drape, hand hygiene and skin antiseptic. A timeout was performed prior to the initiation of the procedure. PROCEDURE: The lower abdomen was prepped with Betadine in a sterile fashion, and a sterile drape was applied covering the operative field. A sterile gown and sterile gloves were used for the procedure. Local anesthesia was provided with 1% Lidocaine. An 8 [place] dilator was advanced into the draining fistula at the lower abdominal location. A Bentson wire was advanced through the dilator and into the fluid collection. The dilator was advanced over the Bentson into the fluid  collection. The Bentson was then exchanged for an Amplatz wire. A 12 French drain was then advanced over the Amplatz and coiled in the fluid collection. It was looped and string fixed then sewn to the skin. 15 cc pus was aspirated. 50 cc mixture of Isovue 300 and saline were then injected. FINDINGS: Imaging confirms replacement of the 64 French drain in the pelvic abscess at the dome of the liver. 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 There is no evidence of contrast in the bladder to suggest a fistula to the bladder. 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 There is no evidence of fistula to the bladder. Electronically Signed   By: Marybelle Killings M.D.   On: 09/03/2017 15:20     Scheduled Meds: . aspirin  81 mg Oral Daily  . [START ON 09/05/2017] colchicine  0.3 mg Oral Daily  . donepezil  10 mg Oral QHS  . levothyroxine  50 mcg Oral Q breakfast  . simvastatin  20 mg Oral q1800  . sodium chloride flush  5 mL Intravenous Q8H   Continuous Infusions: . lactated ringers 100 mL/hr at 09/04/17 1135  . piperacillin-tazobactam (ZOSYN)  IV 3.375 g (09/04/17 0540)    Marzetta Board, MD, PhD [facility] Pager [phone]  If 7PM-7AM, please contact night-coverage [email or web address] Password [email or web address] 09/04/2017, 12:10 PM

## 2017-09-05 LAB — BASIC METABOLIC PANEL
Anion gap: 8 (ref 5–15)
BUN: 21 mg/dL — AB (ref 6–20)
CHLORIDE: 106 mmol/L (ref 101–111)
CO2: 24 mmol/L (ref 22–32)
CREATININE: 2.28 mg/dL — AB (ref 0.61–1.24)
Calcium: 8.6 mg/dL — ABNORMAL LOW (ref 8.9–10.3)
GFR calc Af Amer: 29 mL/min — ABNORMAL LOW (ref >60)
GFR, EST NON AFRICAN AMERICAN: 25 mL/min — AB (ref >60)
GLUCOSE: 77 mg/dL (ref 65–99)
POTASSIUM: 4.4 mmol/L (ref 3.5–5.1)
SODIUM: 138 mmol/L (ref 135–145)

## 2017-09-05 LAB — CBC
HEMATOCRIT: 38.4 % — AB (ref 39.0–52.0)
Hemoglobin: 12.2 g/dL — ABNORMAL LOW (ref 13.0–17.0)
MCH: 30.7 pg (ref 26.0–34.0)
MCHC: 31.8 g/dL (ref 30.0–36.0)
MCV: 96.5 fL (ref 78.0–100.0)
PLATELETS: 152 10*3/uL (ref 150–400)
RBC: 3.98 MIL/uL — ABNORMAL LOW (ref 4.22–5.81)
RDW: 14.3 % (ref 11.5–15.5)
WBC: 6.4 10*3/uL (ref 4.0–10.5)

## 2017-09-05 NOTE — Care Management Note (Addendum)
Case Management Note  Patient Details  Name: RIELY BASKETT MRN: [id number] Date of Birth: [birth year]  Subjective/Objective:  CM received consult for [facility] needs for this 83 y.o. M to be discharged to [facility] with [facility], PT and [facility] aide. Admitted 09/02/2017 with recurrent abscess fistula and Long term percutaneous drain which [facility] indicates will be followed at [facility] clinic. Home on PO Abx. Spoke with Daughter who tells me he has had [facility] in past and she will speak with other daughter to assess name so I can resume care.                   Action/Plan: CM will sign off for now but will be available should additional discharge needs arise or disposition change.    Expected Discharge Date:                  Expected Discharge Plan:     In-House Referral:     Discharge planning Services  CM Consult  Post Acute Care Choice:  Home Health Choice offered to:  Adult Children Neoma Laming, Daughter at bedside)  DME Arranged:    DME Agency:     HH Arranged:  RN, PT, Nurse's Aide [place] Agency:     Status of Service:  In process, will continue to follow  If discussed at Long Length of Stay Meetings, dates discussed:    Additional Comments: Spoke with Neoma Laming who could not remember agency used on the past but has now chosen [facility] to provide [facility] care. I have alerted Jermaine, rep for that agency.   Delrae Sawyers, RN 09/05/2017, 9:36 AM

## 2017-09-05 NOTE — Progress Notes (Signed)
PROGRESS NOTE  Robert Barnett XTK:[id number] DOB: [birth year] DOA: 09/02/2017 PCP: Robert Lima, MD   LOS: 2 days   Brief Narrative / Interim history: Robert Barnett is a 83 y.o. male with medical history significant of hypertension, hyperlipidemia, prior stroke, dysphagia, BPH, dementia, and remote (11/15) abscess of abdominal cavity involving the dome of the bladder with possible complication from an untreated diverticular abscess with IR drain placed.  He has had no other apparent issues until recently. He was seen by his PCP 9/5 because there was a drain in his abdomen and that area now has purulent drainage; the PCP sent him to the ER.  Daughter reports that he isn't feeling bad.  Daughter thought he was peeing on himself because of the yellow stuff on his t-shirt, but she looked more closely and saw the hole and the yellow stuff coming out.  She saw this last week.  Placed bandages and other stuff on the belly without relief.  CT scan of the abdomen and pelvis done in the emergency room on 9/5 showed 4 x 3.2 cm gas and fluid collection within the anterior pelvis, anterior to the dome of the bladder, concerning for recurrent abscess  Assessment & Plan: Principal Problem:   Abdominal wall abscess Active Problems:   Moderate dementia without behavioral disturbance   Essential hypertension   Colovesical fistula   CKD (chronic kidney disease) stage 4, GFR 15-29 ml/min (HCC)   AAA (abdominal aortic aneurysm) (HCC)   Pelvic abscess -General surgery, interventional radiology and urology consulted.  -Interventional radiology drained fluid collection on 9/6, drain remained in place -Placed on Zosyn on admission, continue for now, presented to microbiology with gram-negative rods, gram-positive rods and gram-positive cocci.  Speciation still pending today -Urology does not think that there is evidence for colovesical fistula  Dementia -Continue Aricept  Hypertension -Discontinue ACE  inhibitor as patient has CKD stage IV -Continue to monitor, stable for now blood pressure 140/64 this morning  Chronic kidney disease stage IV -Creatinine appears close to baseline.  Discontinue ACE inhibitor -Improved today to 2.36 from 2.7 yesterday  AAA -Stable, needs follow-up ultrasound one year  Hyperlipidemia -Continue Zocor  Hypothyroidism -Continue Synthroid   DVT prophylaxis: SCD Code Status: Full code Family Communication: Discussed with daughter at bedside Disposition Plan: home when ready, likely 24-48 hours pending microbiology results from abscess drain  Consultants:   General surgery  IR  Urology   Procedures:   Percutaneous drainage 9/6  Antimicrobials:  Zosyn 9/5 >>   Subjective: -Wants to go home.  [birth year] is today.  Objective: Vitals:   09/03/17 2100 09/04/17 1333 09/04/17 2145 09/05/17 0528  BP: 135/65 125/63 (!) 120/58 140/64  Pulse: (!) 46 (!) 57 (!) 46 (!) 45  Resp: 20 20 18 17   Temp: 98.4 F (36.9 C) 98.2 F (36.8 C) 98 F (36.7 C) 97.9 F (36.6 C)  TempSrc: Oral Oral Oral Oral  SpO2: 100% 97% 98% 99%  Weight:      Height:        Intake/Output Summary (Last 24 hours) at 09/05/17 1030 Last data filed at 09/05/17 1014  Gross per 24 hour  Intake             3755 ml  Output              570 ml  Net             3185 ml   Filed Weights   09/02/17 1607  09/03/17 0138  Weight: 98.4 kg (217 lb) 98.6 kg (217 lb 6 oz)    Examination:  Vitals:   09/03/17 2100 09/04/17 1333 09/04/17 2145 09/05/17 0528  BP: 135/65 125/63 (!) 120/58 140/64  Pulse: (!) 46 (!) 57 (!) 46 (!) 45  Resp: 20 20 18 17   Temp: 98.4 F (36.9 C) 98.2 F (36.8 C) 98 F (36.7 C) 97.9 F (36.6 C)  TempSrc: Oral Oral Oral Oral  SpO2: 100% 97% 98% 99%  Weight:      Height:        Constitutional: NAD Respiratory: CTA biL Cardiovascular: RRR   Data Reviewed: I have independently reviewed following labs and imaging studies   CBC:  Recent  Labs Lab September 23, 2017 2232 09/23/17 2245 09/04/17 0533 09/05/17 0703  WBC 8.2  --  5.1 6.4  NEUTROABS 5.6  --   --   --   HGB 12.0* 12.6* 11.0* 12.2*  HCT 37.9* 37.0* 34.3* 38.4*  MCV 97.7  --  96.6 96.5  PLT 157  --  137* 767   Basic Metabolic Panel:  Recent Labs Lab 09-23-2017 2232 2017/09/23 2245 09/04/17 0533 09/05/17 0703  NA 140 142 139 138  K 5.0 5.0 4.2 4.4  CL 110 111 107 106  CO2 23  --  24 24  GLUCOSE 94 95 74 77  BUN 36* 40* 26* 21*  CREATININE 2.74* 2.70* 2.36* 2.28*  CALCIUM 8.5*  --  8.4* 8.6*   GFR: Estimated Creatinine Clearance: 27 mL/min (A) (by C-G formula based on SCr of 2.28 mg/dL (H)). Liver Function Tests:  Recent Labs Lab 23-Sep-2017 2232  AST 32  ALT 17  ALKPHOS 70  BILITOT 0.6  PROT 7.2  ALBUMIN 3.2*   No results for input(s): LIPASE, AMYLASE in the last 168 hours. No results for input(s): AMMONIA in the last 168 hours. Coagulation Profile:  Recent Labs Lab 09/03/17 1153  INR 1.07   Cardiac Enzymes: No results for input(s): CKTOTAL, CKMB, CKMBINDEX, TROPONINI in the last 168 hours. BNP (last 3 results) No results for input(s): PROBNP in the last 8760 hours. HbA1C: No results for input(s): HGBA1C in the last 72 hours. CBG:  Recent Labs Lab 09/03/17 2107  GLUCAP 74   Lipid Profile: No results for input(s): CHOL, HDL, LDLCALC, TRIG, CHOLHDL, LDLDIRECT in the last 72 hours. Thyroid Function Tests: No results for input(s): TSH, T4TOTAL, FREET4, T3FREE, THYROIDAB in the last 72 hours. Anemia Panel: No results for input(s): VITAMINB12, FOLATE, FERRITIN, TIBC, IRON, RETICCTPCT in the last 72 hours. Urine analysis:    Component Value Date/Time   COLORURINE YELLOW 09/03/2017 1034   APPEARANCEUR CLEAR 09/03/2017 1034   LABSPEC 1.015 09/03/2017 1034   PHURINE 6.0 09/03/2017 1034   GLUCOSEU NEGATIVE 09/03/2017 1034   HGBUR SMALL (A) 09/03/2017 [place] 09/03/2017 1034   KETONESUR NEGATIVE 09/03/2017 1034    PROTEINUR NEGATIVE 09/03/2017 1034   UROBILINOGEN 1.0 05/09/2015 0023   NITRITE NEGATIVE 09/03/2017 1034   LEUKOCYTESUR NEGATIVE 09/03/2017 1034   Sepsis Labs: Invalid input(s): PROCALCITONIN, LACTICIDVEN  Recent Results (from the past 240 hour(s))  Culture, Urine     Status: None   Collection Time: 09/03/17 10:34 AM  Result Value Ref Range Status   Specimen Description URINE, CLEAN CATCH  Final   Special Requests NONE  Final   Culture   Final    NO GROWTH Performed at [facility], [address] [place], [place] [address]    Report  Status 09/04/2017 FINAL  Final  Aerobic/Anaerobic Culture (surgical/deep wound)     Status: None (Preliminary result)   Collection Time: 09/03/17  1:22 PM  Result Value Ref Range Status   Specimen Description ABDOMEN  Final   Special Requests NONE  Final   Gram Stain   Final    ABUNDANT WBC PRESENT, PREDOMINANTLY PMN MODERATE GRAM NEGATIVE RODS FEW GRAM POSITIVE RODS FEW GRAM POSITIVE COCCI    Culture   Final    CULTURE REINCUBATED FOR BETTER GROWTH Performed at [facility], [place] [address], [place], [place] [address]    Report Status PENDING  Incomplete      Radiology Studies: Ct Image Guided Drainage By Percutaneous Catheter  Result Date: 09/03/2017 INDICATION: Pelvic abscess at the dome of the liver EXAM: CT GUIDED DRAINAGE OF PELVIC ABSCESS. CONTRAST INJECTION TO EVALUATE FOR FISTULA. MEDICATIONS: The patient is currently admitted to the hospital and receiving intravenous antibiotics. The antibiotics were administered within an appropriate time frame prior to the initiation of the procedure. ANESTHESIA/SEDATION: One mg IV Versed 150 mcg IV Fentanyl Moderate Sedation Time:  15 The patient was continuously monitored during the procedure by the interventional radiology nurse under my direct supervision. COMPLICATIONS: None immediate. TECHNIQUE: Informed written consent was obtained from the patient after a thorough discussion of the  procedural risks, benefits and alternatives. All questions were addressed. Maximal Sterile Barrier Technique was utilized including caps, mask, sterile gowns, sterile gloves, sterile drape, hand hygiene and skin antiseptic. A timeout was performed prior to the initiation of the procedure. PROCEDURE: The lower abdomen was prepped with Betadine in a sterile fashion, and a sterile drape was applied covering the operative field. A sterile gown and sterile gloves were used for the procedure. Local anesthesia was provided with 1% Lidocaine. An 8 [place] dilator was advanced into the draining fistula at the lower abdominal location. A Bentson wire was advanced through the dilator and into the fluid collection. The dilator was advanced over the Bentson into the fluid collection. The Bentson was then exchanged for an Amplatz wire. A 12 French drain was then advanced over the Amplatz and coiled in the fluid collection. It was looped and string fixed then sewn to the skin. 15 cc pus was aspirated. 50 cc mixture of Isovue 300 and saline were then injected. FINDINGS: Imaging confirms replacement of the 90 French drain in the pelvic abscess at the dome of the liver. 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 There is no evidence of contrast in the bladder to suggest a fistula to the bladder. 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 There is no evidence of fistula to the bladder. Electronically Signed   By: Marybelle Killings M.D.   On: 09/03/2017 15:20     Scheduled Meds: . aspirin  81 mg Oral Daily  . colchicine  0.3 mg Oral Daily  . donepezil  10 mg Oral QHS  . levothyroxine  50 mcg Oral Q breakfast  . simvastatin  20 mg Oral q1800  . sodium chloride flush  5 mL Intravenous Q8H   Continuous Infusions: . lactated ringers 100  mL/hr at 09/04/17 2135  . piperacillin-tazobactam (ZOSYN)  IV Stopped (09/05/17 9937)    Marzetta Board, MD, PhD [facility] Pager [phone]  If 7PM-7AM, please contact night-coverage [email or web address] Password [email or web address]  09/05/2017, 10:30 AM

## 2017-09-05 NOTE — Progress Notes (Signed)
[facility] Progress Note     Subjective: CC:  Daughter at bedside reporting a 56 of patient history 2/2 patients dementia. Patient denies abdominal pain, nausea, or vomiting. Had a BM today.   Objective: Vital signs in last 24 hours: Temp:  [97.9 F (36.6 C)-98.2 F (36.8 C)] 97.9 F (36.6 C) (09/08 0528) Pulse Rate:  [45-57] 45 (09/08 0528) Resp:  [17-20] 17 (09/08 0528) BP: (120-140)/(58-64) 140/64 (09/08 0528) SpO2:  [97 %-99 %] 99 % (09/08 0528) Last BM Date: 09/03/17  Intake/Output from previous day: 09/07 0701 - 09/08 0700 In: 3515 [P.O.:960; I.V.:2400; IV Piggyback:150] Out: 1020 [Urine:940; Drains:80] Intake/Output this shift: No intake/output data recorded.  PE: Gen:  Alert, NAD Abd: Soft, appropriately tender LLQ, non-distended, LLQ drain c/d/i with dried sanguinous drainage on dressing. Drain with purulent/sanguinous drainage. Skin: warm and dry, no rashes  Psych: A&Ox3   Lab Results:   Recent Labs  09/04/17 0533 09/05/17 0703  WBC 5.1 6.4  HGB 11.0* 12.2*  HCT 34.3* 38.4*  PLT 137* 152   BMET  Recent Labs  09/04/17 0533 09/05/17 0703  NA 139 138  K 4.2 4.4  CL 107 106  CO2 24 24  GLUCOSE 74 77  BUN 26* 21*  CREATININE 2.36* 2.28*  CALCIUM 8.4* 8.6*   PT/INR  Recent Labs  09/03/17 1153  LABPROT 13.8  INR 1.07   CMP     Component Value Date/Time   NA 138 09/05/2017 0703   K 4.4 09/05/2017 0703   CL 106 09/05/2017 0703   CO2 24 09/05/2017 0703   GLUCOSE 77 09/05/2017 0703   BUN 21 (H) 09/05/2017 0703   CREATININE 2.28 (H) 09/05/2017 0703   CREATININE 1.88 (H) 09/18/2015 0919   CALCIUM 8.6 (L) 09/05/2017 0703   PROT 7.2 09/02/2017 2232   ALBUMIN 3.2 (L) 09/02/2017 2232   AST 32 09/02/2017 2232   ALT 17 09/02/2017 2232   ALKPHOS 70 09/02/2017 2232   BILITOT 0.6 09/02/2017 2232   GFRNONAA 25 (L) 09/05/2017 0703   GFRNONAA 33 (L) 09/18/2015 0919   GFRAA 29 (L) 09/05/2017 0703   GFRAA 38 (L) 09/18/2015  0919   Lipase     Component Value Date/Time   LIPASE 88 (H) 11/24/2014 0147       Studies/Results: Ct Image Guided Drainage By Percutaneous Catheter  Result Date: 09/03/2017 INDICATION: Pelvic abscess at the dome of the liver EXAM: CT GUIDED DRAINAGE OF PELVIC ABSCESS. CONTRAST INJECTION TO EVALUATE FOR FISTULA. MEDICATIONS: The patient is currently admitted to the hospital and receiving intravenous antibiotics. The antibiotics were administered within an appropriate time frame prior to the initiation of the procedure. ANESTHESIA/SEDATION: One mg IV Versed 150 mcg IV Fentanyl Moderate Sedation Time:  15 The patient was continuously monitored during the procedure by the interventional radiology nurse under my direct supervision. COMPLICATIONS: None immediate. TECHNIQUE: Informed written consent was obtained from the patient after a thorough discussion of the procedural risks, benefits and alternatives. All questions were addressed. Maximal Sterile Barrier Technique was utilized including caps, mask, sterile gowns, sterile gloves, sterile drape, hand hygiene and skin antiseptic. A timeout was performed prior to the initiation of the procedure. PROCEDURE: The lower abdomen was prepped with Betadine in a sterile fashion, and a sterile drape was applied covering the operative field. A sterile gown and sterile gloves were used for the procedure. Local anesthesia was provided with 1% Lidocaine. An 8 [place] dilator was advanced into the draining fistula at the lower  abdominal location. A Bentson wire was advanced through the dilator and into the fluid collection. The dilator was advanced over the Bentson into the fluid collection. The Bentson was then exchanged for an Amplatz wire. A 12 French drain was then advanced over the Amplatz and coiled in the fluid collection. It was looped and string fixed then sewn to the skin. 15 cc pus was aspirated. 50 cc mixture of Isovue 300 and saline were then injected.  FINDINGS: Imaging confirms replacement of the 8 French drain in the pelvic abscess at the dome of the liver. 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 There is no evidence of contrast in the bladder to suggest a fistula to the bladder. 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 There is no evidence of fistula to the bladder. Electronically Signed   By: Marybelle Killings M.D.   On: 09/03/2017 15:20    Anti-infectives: Anti-infectives    Start     Dose/Rate Route Frequency Ordered Stop   09/03/17 0500  piperacillin-tazobactam (ZOSYN) IVPB 3.375 g     3.375 g 12.5 mL/hr over 240 Minutes Intravenous Every 8 hours 09/03/17 0219     09/02/17 2215  piperacillin-tazobactam (ZOSYN) IVPB 3.375 g     3.375 g 100 mL/hr over 30 Minutes Intravenous  Once 09/02/17 2208 09/02/17 2306     Assessment/Plan Pelvic abscess w/ Colocutaneous fistula - s/p placement 12F perc drain 9/6 through previous drain tract Dr. Barbie Banner; follow CX. GS w/ GNR, few gram positive rods, few gram positive cocci. Continue IV abx.  Question of colovesical fistula - urology evaluated and low concern for CV fistula, signed off. PMH sigmoid diverticulitis with abscess - treated with long term perc drain placement; patient denied surgery (particularly a colostomy).   Dementia HTN CKD IV AAA Hypothyroidism  FEN: HH/carb mod ID: Zosyn 9/6 >> VTE: SCD's, ok to start chemical VTE from surgical perspective. Follow up: will require F/U in [facility] surgical office [facility] or [place]) and drain clinic.   Plan: follow cultures and plan for discharge home in 1-2 days with drain in place and appropriate PO abx.    LOS: 2 days    Rosario Adie, MD  [facility]

## 2017-09-06 NOTE — Progress Notes (Signed)
PROGRESS NOTE  Robert Barnett WCH:[id number] DOB: [birth year] DOA: 09/02/2017 PCP: Janith Lima, MD   LOS: 3 days   Brief Narrative / Interim history: Robert Barnett is a 83 y.o. male with medical history significant of hypertension, hyperlipidemia, prior stroke, dysphagia, BPH, dementia, and remote (11/15) abscess of abdominal cavity involving the dome of the bladder with possible complication from an untreated diverticular abscess with IR drain placed.  He has had no other apparent issues until recently. He was seen by his PCP 9/5 because there was a drain in his abdomen and that area now has purulent drainage; the PCP sent him to the ER.  Daughter reports that he isn't feeling bad.  Daughter thought he was peeing on himself because of the yellow stuff on his t-shirt, but she looked more closely and saw the hole and the yellow stuff coming out.  She saw this last week.  Placed bandages and other stuff on the belly without relief.  CT scan of the abdomen and pelvis done in the emergency room on 9/5 showed 4 x 3.2 cm gas and fluid collection within the anterior pelvis, anterior to the dome of the bladder, concerning for recurrent abscess  Assessment & Plan: Principal Problem:   Abdominal wall abscess Active Problems:   Moderate dementia without behavioral disturbance   Essential hypertension   Colovesical fistula   CKD (chronic kidney disease) stage 4, GFR 15-29 ml/min (HCC)   AAA (abdominal aortic aneurysm) (HCC)   Pelvic abscess -General surgery, interventional radiology and urology consulted.  -Interventional radiology drained fluid collection on 9/6, drain remained in place -Placed on Zosyn on admission, continue for now, presented to microbiology with gram-negative rods, gram-positive rods and gram-positive cocci.  Speciation still pending, called micro lab, should be final by tomorrow, ruling out staph -Urology does not think that there is evidence for colovesical  fistula  Dementia -Continue Aricept  Hypertension -Discontinue ACE inhibitor as patient has CKD stage IV  Chronic kidney disease stage IV -Creatinine appears close to baseline.  Discontinue ACE inhibitor  AAA -Stable, needs follow-up ultrasound one year  Hyperlipidemia -Continue Zocor  Hypothyroidism -Continue Synthroid   DVT prophylaxis: SCD Code Status: Full code Family Communication: Discussed with daughter at bedside Disposition Plan: home when ready, likely 24-48 hours pending microbiology results from abscess drain  Consultants:   General surgery  IR  Urology   Procedures:   Percutaneous drainage 9/6  Antimicrobials:  Zosyn 9/5 >>   Subjective: -Wants to go home.  His [birth year] is today.  Objective: Vitals:   09/05/17 0528 09/05/17 1313 09/05/17 2121 09/06/17 0630  BP: 140/64 135/60 (!) 147/59 (!) 151/58  Pulse: (!) 45 (!) 52 (!) 47 (!) 47  Resp: 17 18 18 18   Temp: 97.9 F (36.6 C) 98 F (36.7 C) 98.4 F (36.9 C) 98.1 F (36.7 C)  TempSrc: Oral Oral Oral Oral  SpO2: 99% 99% 98% 100%  Weight:      Height:        Intake/Output Summary (Last 24 hours) at 09/06/17 1236 Last data filed at 09/06/17 1054  Gross per 24 hour  Intake             2540 ml  Output              390 ml  Net             2150 ml   Filed Weights   09/02/17 1607 09/03/17 0138  Weight: 98.4 kg (217  lb) 98.6 kg (217 lb 6 oz)    Examination:  Vitals:   09/05/17 0528 09/05/17 1313 09/05/17 2121 09/06/17 0630  BP: 140/64 135/60 (!) 147/59 (!) 151/58  Pulse: (!) 45 (!) 52 (!) 47 (!) 47  Resp: 17 18 18 18   Temp: 97.9 F (36.6 C) 98 F (36.7 C) 98.4 F (36.9 C) 98.1 F (36.7 C)  TempSrc: Oral Oral Oral Oral  SpO2: 99% 99% 98% 100%  Weight:      Height:        Constitutional: NAD Respiratory: CTA Cardiovascular: RRR   Data Reviewed: I have independently reviewed following labs and imaging studies   CBC:  Recent Labs Lab 09/02/17 2232 09/02/17 2245  09/04/17 0533 09/05/17 0703  WBC 8.2  --  5.1 6.4  NEUTROABS 5.6  --   --   --   HGB 12.0* 12.6* 11.0* 12.2*  HCT 37.9* 37.0* 34.3* 38.4*  MCV 97.7  --  96.6 96.5  PLT 157  --  137* 762   Basic Metabolic Panel:  Recent Labs Lab 09/02/17 2232 09/02/17 2245 09/04/17 0533 09/05/17 0703  NA 140 142 139 138  K 5.0 5.0 4.2 4.4  CL 110 111 107 106  CO2 23  --  24 24  GLUCOSE 94 95 74 77  BUN 36* 40* 26* 21*  CREATININE 2.74* 2.70* 2.36* 2.28*  CALCIUM 8.5*  --  8.4* 8.6*   GFR: Estimated Creatinine Clearance: 27 mL/min (A) (by C-G formula based on SCr of 2.28 mg/dL (H)). Liver Function Tests:  Recent Labs Lab 09/02/17 2232  AST 32  ALT 17  ALKPHOS 70  BILITOT 0.6  PROT 7.2  ALBUMIN 3.2*   No results for input(s): LIPASE, AMYLASE in the last 168 hours. No results for input(s): AMMONIA in the last 168 hours. Coagulation Profile:  Recent Labs Lab 09/03/17 1153  INR 1.07   Cardiac Enzymes: No results for input(s): CKTOTAL, CKMB, CKMBINDEX, TROPONINI in the last 168 hours. BNP (last 3 results) No results for input(s): PROBNP in the last 8760 hours. HbA1C: No results for input(s): HGBA1C in the last 72 hours. CBG:  Recent Labs Lab 09/03/17 2107  GLUCAP 74   Lipid Profile: No results for input(s): CHOL, HDL, LDLCALC, TRIG, CHOLHDL, LDLDIRECT in the last 72 hours. Thyroid Function Tests: No results for input(s): TSH, T4TOTAL, FREET4, T3FREE, THYROIDAB in the last 72 hours. Anemia Panel: No results for input(s): VITAMINB12, FOLATE, FERRITIN, TIBC, IRON, RETICCTPCT in the last 72 hours. Urine analysis:    Component Value Date/Time   COLORURINE YELLOW 09/03/2017 1034   APPEARANCEUR CLEAR 09/03/2017 1034   LABSPEC 1.015 09/03/2017 1034   PHURINE 6.0 09/03/2017 1034   GLUCOSEU NEGATIVE 09/03/2017 1034   HGBUR SMALL (A) 09/03/2017 [place] 09/03/2017 1034   KETONESUR NEGATIVE 09/03/2017 1034   PROTEINUR NEGATIVE 09/03/2017 1034    UROBILINOGEN 1.0 05/09/2015 0023   NITRITE NEGATIVE 09/03/2017 1034   LEUKOCYTESUR NEGATIVE 09/03/2017 1034   Sepsis Labs: Invalid input(s): PROCALCITONIN, LACTICIDVEN  Recent Results (from the past 240 hour(s))  Culture, Urine     Status: None   Collection Time: 09/03/17 10:34 AM  Result Value Ref Range Status   Specimen Description URINE, CLEAN CATCH  Final   Special Requests NONE  Final   Culture   Final    NO GROWTH Performed at [facility], [address], [place], [place] [address]    Report Status 09/04/2017 FINAL  Final  Aerobic/Anaerobic  Culture (surgical/deep wound)     Status: Abnormal (Preliminary result)   Collection Time: 09/03/17  1:22 PM  Result Value Ref Range Status   Specimen Description ABDOMEN  Final   Special Requests NONE  Final   Gram Stain   Final    ABUNDANT WBC PRESENT, PREDOMINANTLY PMN MODERATE GRAM NEGATIVE RODS FEW GRAM POSITIVE RODS FEW GRAM POSITIVE COCCI Performed at [facility], [place] [address] [place], [place] [address]    Culture MULTIPLE ORGANISMS PRESENT, NONE PREDOMINANT (A)  Final   Report Status PENDING  Incomplete      Radiology Studies: No results found.   Scheduled Meds: . aspirin  81 mg Oral Daily  . colchicine  0.3 mg Oral Daily  . donepezil  10 mg Oral QHS  . levothyroxine  50 mcg Oral Q breakfast  . simvastatin  20 mg Oral q1800  . sodium chloride flush  5 mL Intravenous Q8H   Continuous Infusions: . lactated ringers 100 mL/hr at 09/06/17 0904  . piperacillin-tazobactam (ZOSYN)  IV Stopped (09/06/17 1027)    Marzetta Board, MD, PhD [facility] Pager [phone]  If 7PM-7AM, please contact night-coverage [email or web address] Password [facility] 09/06/2017, 12:36 PM

## 2017-09-07 ENCOUNTER — Other Ambulatory Visit: Payer: Self-pay | Admitting: Radiology

## 2017-09-07 ENCOUNTER — Other Ambulatory Visit: Payer: Self-pay | Admitting: Internal Medicine

## 2017-09-07 DIAGNOSIS — I6300 Cerebral infarction due to thrombosis of unspecified precerebral artery: Secondary | ICD-10-CM

## 2017-09-07 DIAGNOSIS — K5720 Diverticulitis of large intestine with perforation and abscess without bleeding: Secondary | ICD-10-CM

## 2017-09-07 DIAGNOSIS — F0390 Unspecified dementia without behavioral disturbance: Secondary | ICD-10-CM

## 2017-09-07 MED ORDER — AMOXICILLIN-POT CLAVULANATE 875-125 MG PO TABS: 1.0000 | ORAL_TABLET | Freq: Two times a day (BID) | ORAL | 0 refills | Status: DC

## 2017-09-07 NOTE — Discharge Summary (Signed)
Physician Discharge Summary  Robert Barnett [place] OZH:[id number] DOB: 1932-11-02 DOA: 09/02/2017  PCP: Janith Lima, MD  Admit date: 09/02/2017 Discharge date: 09/07/2017  Admitted From: home Disposition:  home  Recommendations for Outpatient Follow-up:  1. Follow up with Dr. Rosendo Gros in 1-2 weeks 2. Continue Augmentin for 10 days  Home Health: PT, RN, aide Equipment/Devices: none  Discharge Condition: stable CODE STATUS: Full code Diet recommendation: regular  HPI: Per. Dr. Lorin Mercy, Robert Barnett is a 83 y.o. male with medical history significant of hypertension, hyperlipidemia, prior stroke, dysphagia, BPH, dementia, and remote (11/15) abscess of abdominal cavity involving the dome of the bladder with possible complication from an untreated diverticular abscess with IR drain placed.  He has had no other apparent issues until recently. He was seen by his PCP today because there was a drain in his abdomen and that area now has purulent drainage; the PCP sent him to the ER.  Daughter reports that he isn't feeling bad.  Daughter thought he was peeing on himself because of the yellow stuff on his t-shirt, but she looked more closely and saw the hole and the yellow stuff coming out.  She saw this last week.  Placed bandages and other stuff on the belly without relief.  She took him to Dr. Ronnald Ramp today and he sent them in to the ER.  No fevers.  Not sleeping, not more confused.  He pees a lot and has diarrhea a lot. ED Course: CT with recurrent abscess and fistula.  Zosyn, surgery consult.  Likely needs IR in the AM.   Hospital Course: Discharge Diagnoses:  Principal Problem:   Abdominal wall abscess Active Problems:   Moderate dementia without behavioral disturbance   Essential hypertension   Colovesical fistula   CKD (chronic kidney disease) stage 4, GFR 15-29 ml/min (HCC)   AAA (abdominal aortic aneurysm) (HCC)   Pelvic abscess -General surgery, interventional radiology and urology  consulted. Interventional radiology drained fluid collection on 9/6, drain remained in place. Patient was placed on Zosyn on admission, and cultures showed multiple organisms present, non-predominant.  Discussed with microbiology lab on the day of discharge, Staphylococcus has been ruled out.  Patient's antibiotics were changed to Augmentin, and he is to continue a 10 additional days to complete a two-week course from time of drain placement. Urology does not think that there is evidence for colovesical fistula Dementia -Continue Aricept Hypertension -resume home medications Chronic kidney disease stage IV -Creatinine appears close to baseline AAA -Stable, needs follow-up ultrasound one year Hyperlipidemia -Continue Zocor Hypothyroidism -Continue Synthroid   Discharge Instructions   Allergies as of 09/07/2017   No Known Allergies     Medication List    TAKE these medications   amoxicillin-clavulanate 875-125 MG tablet Commonly known as:  AUGMENTIN Take 1 tablet by mouth 2 (two) times daily.   aspirin 81 MG tablet Take 81 mg by mouth daily.   benazepril 40 MG tablet Commonly known as:  LOTENSIN TAKE 1 TABLET BY MOUTH EVERY DAY   clopidogrel 75 MG tablet Commonly known as:  PLAVIX Take 1 tablet (75 mg total) by mouth daily.   Colchicine 0.6 MG Caps Commonly known as:  MITIGARE Take 1 tablet by mouth 2 (two) times daily.   donepezil 10 MG tablet Commonly known as:  ARICEPT Take 1 tablet (10 mg total) by mouth at bedtime.   levothyroxine 50 MCG tablet Commonly known as:  SYNTHROID, LEVOTHROID TAKE 1 TABLET (50 MCG TOTAL) BY MOUTH DAILY.  simvastatin 20 MG tablet Commonly known as:  ZOCOR Take 20 mg by mouth daily.            Discharge Care Instructions        Start     Ordered   09/07/17 0000  amoxicillin-clavulanate (AUGMENTIN) 875-125 MG tablet  2 times daily     09/07/17 0946     Follow-up Information    [facility].     Why:  HHPT/RN/Aide has been ordered by your MD and will be provided by above agency. A representative will be in touch with you within 24-48 hours of discharge to make arrangements for your initial visit.  Contact information: [place] [address] [phone]        Ralene Ok, MD. Schedule an appointment as soon as possible for a visit in 4 week(s).   Specialty:  General Surgery Contact information: [place] [address] [phone]          No Known Allergies  Consultations:  General surgery   IR  Urology  Procedures/Studies:  Abscess drain and drain placement 9/6  Ct Abdomen Pelvis Wo Contrast  Result Date: 09/02/2017 CLINICAL DATA:  Abdominal pain with fever EXAM: CT ABDOMEN AND PELVIS WITHOUT CONTRAST TECHNIQUE: Multidetector CT imaging of the abdomen and pelvis was performed following the standard protocol without IV contrast. COMPARISON:  07/09/2016, 07/09/2016, 09/27/2015 FINDINGS: Lower chest: Lung bases demonstrate no acute consolidation or pleural effusion. The heart is nonenlarged. Hepatobiliary: Stable subcentimeter hypodensity within the dome of the left lobe. Multiple calcified gallstones. No biliary dilatation Pancreas: Unremarkable. No pancreatic ductal dilatation or surrounding inflammatory changes. Spleen: Normal in size without focal abnormality. Adrenals/Urinary Tract: Adrenal glands are within normal limits. Slightly atrophic kidneys with perinephric fat stranding. Bilateral renal cysts. No hydronephrosis. Bladder is thick-walled. Stomach/Bowel: The stomach is decompressed. No dilated small bowel. Sigmoid colon diverticular disease without acute wall thickening Vascular/Lymphatic: Aortic atherosclerosis. 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 No  significantly enlarged abdominal or pelvic lymph nodes Reproductive: Slightly enlarged prostate Other: Negative for free air. Fat in the inguinal canals. There is moderate soft tissue stranding and inflammatory change within the anterior pelvic wall at the site of prior interventional catheter. There is a gas and fluid filled tract extending to the skin. This is contiguous with a gas and fluid collection that measures 4 x 3.2 cm, it is positions between the bladder and the sigmoid colon. It is concerning for a recurrent abscess. Suspect that there is residual fistula between the distal sigmoid colon and the bladder, series 2, image number 67 and series 4, image number 84 Musculoskeletal: Degenerative changes. No acute or suspicious bone lesion IMPRESSION: 1. 4 x 3.2 cm gas and fluid collection within the anterior pelvis, anterior to the dome of the bladder, concerning for a recurrent abscess. This is contiguous with a fluid and gas filled trach that extends to the skin surface, compatible with a fistula. There is moderate soft tissue stranding and inflammation around the fistulous tract within the anterior pelvic subcutaneous soft tissues. 2. 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 3. Sigmoid colon diverticular disease 4. 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 Recommend followup by ultrasound in  1 year. This recommendation follows ACR consensus guidelines: White Paper of the ACR Incidental Findings Committee II on Vascular Findings. J Am Coll Radiol 2013; 10:789-794. 5. Multiple calcified gallstones Electronically Signed   By: Donavan Foil M.D.   On: 09/02/2017 22:52   Ct Image Guided Drainage By Percutaneous Catheter  Result Date: 09/03/2017 INDICATION: Pelvic abscess at the dome of the liver EXAM: CT GUIDED DRAINAGE OF PELVIC ABSCESS. CONTRAST INJECTION TO EVALUATE FOR FISTULA. MEDICATIONS: The patient is  currently admitted to the hospital and receiving intravenous antibiotics. The antibiotics were administered within an appropriate time frame prior to the initiation of the procedure. ANESTHESIA/SEDATION: One mg IV Versed 150 mcg IV Fentanyl Moderate Sedation Time:  15 The patient was continuously monitored during the procedure by the interventional radiology nurse under my direct supervision. COMPLICATIONS: None immediate. TECHNIQUE: Informed written consent was obtained from the patient after a thorough discussion of the procedural risks, benefits and alternatives. All questions were addressed. Maximal Sterile Barrier Technique was utilized including caps, mask, sterile gowns, sterile gloves, sterile drape, hand hygiene and skin antiseptic. A timeout was performed prior to the initiation of the procedure. PROCEDURE: The lower abdomen was prepped with Betadine in a sterile fashion, and a sterile drape was applied covering the operative field. A sterile gown and sterile gloves were used for the procedure. Local anesthesia was provided with 1% Lidocaine. An 8 [place] dilator was advanced into the draining fistula at the lower abdominal location. A Bentson wire was advanced through the dilator and into the fluid collection. The dilator was advanced over the Bentson into the fluid collection. The Bentson was then exchanged for an Amplatz wire. A 12 French drain was then advanced over the Amplatz and coiled in the fluid collection. It was looped and string fixed then sewn to the skin. 15 cc pus was aspirated. 50 cc mixture of Isovue 300 and saline were then injected. FINDINGS: Imaging confirms replacement of the 23 French drain in the pelvic abscess at the dome of the liver. 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 There is no evidence of contrast in the bladder to suggest a fistula to the bladder. IMPRESSION:  Successful 12 French pelvic abscess drain replacement Injection of contrast confirms a fistula to the adjacent large bowel. There is no evidence of fistula to the bladder. Electronically Signed   By: Marybelle Killings M.D.   On: 09/03/2017 15:20      Subjective: - no chest pain, shortness of breath, no abdominal pain, nausea or vomiting.   Discharge Exam: Vitals:   09/06/17 2122 09/07/17 0556  BP: (!) 157/69 (!) 147/70  Pulse: (!) 45 (!) 51  Resp: 16 16  Temp: 97.9 F (36.6 C) 98.1 F (36.7 C)  SpO2: 98% 100%   Vitals:   09/06/17 0630 09/06/17 1348 09/06/17 2122 09/07/17 0556  BP: (!) 151/58 120/62 (!) 157/69 (!) 147/70  Pulse: (!) 47 (!) 50 (!) 45 (!) 51  Resp: 18 18 16 16   Temp: 98.1 F (36.7 C) 97.8 F (36.6 C) 97.9 F (36.6 C) 98.1 F (36.7 C)  TempSrc: Oral Oral Oral Oral  SpO2: 100% 98% 98% 100%  Weight:      Height:        General: Pt is alert, awake, not in acute distress Cardiovascular: RRR, S1/S2 +, no rubs, no gallops Respiratory: CTA bilaterally, no wheezing, no rhonchi Abdominal:  Soft, NT, ND, bowel sounds + Extremities: no edema, no cyanosis    The results of significant diagnostics from this hospitalization (including imaging, microbiology, ancillary and laboratory) are listed below for reference.     Microbiology: Recent Results (from the past 240 hour(s))  Culture, Urine     Status: None   Collection Time: 09/03/17 10:34 AM  Result Value Ref Range Status   Specimen Description URINE, CLEAN CATCH  Final   Special Requests NONE  Final   Culture   Final    NO GROWTH Performed at [facility], [address], [place], [place] [address]    Report Status 09/04/2017 FINAL  Final  Aerobic/Anaerobic Culture (surgical/deep wound)     Status: Abnormal (Preliminary result)   Collection Time: 09/03/17  1:22 PM  Result Value Ref Range Status   Specimen Description ABDOMEN  Final   Special Requests NONE  Final   Gram Stain   Final    ABUNDANT WBC  PRESENT, PREDOMINANTLY PMN MODERATE GRAM NEGATIVE RODS FEW GRAM POSITIVE RODS FEW GRAM POSITIVE COCCI Performed at [facility], [place] [address] [place], [place] [address]    Culture MULTIPLE ORGANISMS PRESENT, NONE PREDOMINANT (A)  Final   Report Status PENDING  Incomplete     Labs: BNP (last 3 results) No results for input(s): BNP in the last 8760 hours. Basic Metabolic Panel:  Recent Labs Lab 09/02/17 2232 09/02/17 2245 09/04/17 0533 09/05/17 0703  NA 140 142 139 138  K 5.0 5.0 4.2 4.4  CL 110 111 107 106  CO2 23  --  24 24  GLUCOSE 94 95 74 77  BUN 36* 40* 26* 21*  CREATININE 2.74* 2.70* 2.36* 2.28*  CALCIUM 8.5*  --  8.4* 8.6*   Liver Function Tests:  Recent Labs Lab 09/02/17 2232  AST 32  ALT 17  ALKPHOS 70  BILITOT 0.6  PROT 7.2  ALBUMIN 3.2*   No results for input(s): LIPASE, AMYLASE in the last 168 hours. No results for input(s): AMMONIA in the last 168 hours. CBC:  Recent Labs Lab 09/02/17 2232 09/02/17 2245 09/04/17 0533 09/05/17 0703  WBC 8.2  --  5.1 6.4  NEUTROABS 5.6  --   --   --   HGB 12.0* 12.6* 11.0* 12.2*  HCT 37.9* 37.0* 34.3* 38.4*  MCV 97.7  --  96.6 96.5  PLT 157  --  137* 152   Cardiac Enzymes: No results for input(s): CKTOTAL, CKMB, CKMBINDEX, TROPONINI in the last 168 hours. BNP: Invalid input(s): POCBNP CBG:  Recent Labs Lab 09/03/17 2107  GLUCAP 74   D-Dimer No results for input(s): DDIMER in the last 72 hours. Hgb A1c No results for input(s): HGBA1C in the last 72 hours. Lipid Profile No results for input(s): CHOL, HDL, LDLCALC, TRIG, CHOLHDL, LDLDIRECT in the last 72 hours. Thyroid function studies No results for input(s): TSH, T4TOTAL, T3FREE, THYROIDAB in the last 72 hours.  Invalid input(s): FREET3 Anemia work up No results for input(s): VITAMINB12, FOLATE, FERRITIN, TIBC, IRON, RETICCTPCT in the last 72 hours. Urinalysis    Component Value Date/Time   COLORURINE YELLOW 09/03/2017 1034    APPEARANCEUR CLEAR 09/03/2017 1034   LABSPEC 1.015 09/03/2017 1034   PHURINE 6.0 09/03/2017 1034   GLUCOSEU NEGATIVE 09/03/2017 1034   HGBUR SMALL (A) 09/03/2017 1034   [place] 09/03/2017 1034   KETONESUR NEGATIVE 09/03/2017 1034   PROTEINUR NEGATIVE 09/03/2017 1034   UROBILINOGEN 1.0 05/09/2015 0023   NITRITE NEGATIVE  09/03/2017 1034   LEUKOCYTESUR NEGATIVE 09/03/2017 1034   Sepsis Labs Invalid input(s): PROCALCITONIN,  WBC,  LACTICIDVEN Microbiology Recent Results (from the past 240 hour(s))  Culture, Urine     Status: None   Collection Time: 09/03/17 10:34 AM  Result Value Ref Range Status   Specimen Description URINE, CLEAN CATCH  Final   Special Requests NONE  Final   Culture   Final    NO GROWTH Performed at [facility], [address] [place], [place] [address]    Report Status 09/04/2017 FINAL  Final  Aerobic/Anaerobic Culture (surgical/deep wound)     Status: Abnormal (Preliminary result)   Collection Time: 09/03/17  1:22 PM  Result Value Ref Range Status   Specimen Description ABDOMEN  Final   Special Requests NONE  Final   Gram Stain   Final    ABUNDANT WBC PRESENT, PREDOMINANTLY PMN MODERATE GRAM NEGATIVE RODS FEW GRAM POSITIVE RODS FEW GRAM POSITIVE COCCI Performed at [facility], [place] [address] [place], [place] [address]    Culture MULTIPLE ORGANISMS PRESENT, NONE PREDOMINANT (A)  Final   Report Status PENDING  Incomplete     Time coordinating discharge: 35 minutes  SIGNED:  Marzetta Board, MD  [facility] 09/07/2017, 2:30 PM Pager [phone]  If 7PM-7AM, please contact night-coverage [email or web address] Password [email or web address]

## 2017-09-07 NOTE — Progress Notes (Signed)
[facility] Progress Note     Subjective: CC:  Pleasantly demented and thinks he is at home. Denies abdominal pain. Tolerating diet. Having bowel function. Patient son is at bedside.  Objective: Vital signs in last 24 hours: Temp:  [97.8 F (36.6 C)-98.1 F (36.7 C)] 98.1 F (36.7 C) (09/10 0556) Pulse Rate:  [45-51] 51 (09/10 0556) Resp:  [16-18] 16 (09/10 0556) BP: (120-157)/(62-70) 147/70 (09/10 0556) SpO2:  [98 %-100 %] 100 % (09/10 0556) Last BM Date: 09/05/17  Intake/Output from previous day: 09/09 0701 - 09/10 0700 In: 3230 [P.O.:820; I.V.:2400] Out: 8588 [Urine:1200; Drains:44] Intake/Output this shift: No intake/output data recorded.  PE: Gen:  Alert, NAD HEENT: anicteric sclerae, EOMs in tact, pupils equal Pulm: normal respiratory effort, CTAB Abd: Soft, appropriately tender LLQ, non-distended, LLQ drain c/d/i with dried sanguinous drainage on dressing. Drain with purulent/brown drainage.  Drain 44cc/24h Skin: warm and dry, no rashes  Psych: A&Ox3   Lab Results:   Recent Labs  09/05/17 0703  WBC 6.4  HGB 12.2*  HCT 38.4*  PLT 152   BMET  Recent Labs  09/05/17 0703  NA 138  K 4.4  CL 106  CO2 24  GLUCOSE 77  BUN 21*  CREATININE 2.28*  CALCIUM 8.6*   PT/INR No results for input(s): LABPROT, INR in the last 72 hours. CMP     Component Value Date/Time   NA 138 09/05/2017 0703   K 4.4 09/05/2017 0703   CL 106 09/05/2017 0703   CO2 24 09/05/2017 0703   GLUCOSE 77 09/05/2017 0703   BUN 21 (H) 09/05/2017 0703   CREATININE 2.28 (H) 09/05/2017 0703   CREATININE 1.88 (H) 09/18/2015 0919   CALCIUM 8.6 (L) 09/05/2017 0703   PROT 7.2 09/02/2017 2232   ALBUMIN 3.2 (L) 09/02/2017 2232   AST 32 09/02/2017 2232   ALT 17 09/02/2017 2232   ALKPHOS 70 09/02/2017 2232   BILITOT 0.6 09/02/2017 2232   GFRNONAA 25 (L) 09/05/2017 0703   GFRNONAA 33 (L) 09/18/2015 0919   GFRAA 29 (L) 09/05/2017 0703   GFRAA 38 (L) 09/18/2015 0919    Lipase     Component Value Date/Time   LIPASE 88 (H) 11/24/2014 0147       Studies/Results: No results found.  Anti-infectives: Anti-infectives    Start     Dose/Rate Route Frequency Ordered Stop   09/03/17 0500  piperacillin-tazobactam (ZOSYN) IVPB 3.375 g     3.375 g 12.5 mL/hr over 240 Minutes Intravenous Every 8 hours 09/03/17 0219     09/02/17 2215  piperacillin-tazobactam (ZOSYN) IVPB 3.375 g     3.375 g 100 mL/hr over 30 Minutes Intravenous  Once 09/02/17 2208 09/02/17 2306     Assessment/Plan Pelvic abscess w/ Colocutaneous fistula - s/p placement 28F perc drain 9/6 through previous drain tract Dr. Barbie Banner; CX shows multiple species, none predominant. GS w/ GNR, few gram positive rods, few gram positive cocci. Continue IV abx.   Question of colovesical fistula - urology evaluated and low concern for CV fistula, signed off. PMH sigmoid diverticulitis with abscess - treated with long term perc drain placement; patient previously denied surgery (particularly a colostomy).   Dementia HTN CKD IV AAA Hypothyroidism  FEN: HH/carb mod ID: Zosyn 9/6 >> VTE: SCD's, ok to start chemical VTE from surgical perspective. Follow up: will require F/U in [facility] surgical office (Dr. Ralene Ok) in 4 weeks and drain clinic.   Plan: CX with mult species. If unable to get sensitivities  due to CX consider treatment with 10-14 days Augmentin at discharge. Stable for discharge with above follow up from surgical perspective.    LOS: 4 days    Jill Alexanders , [facility] 09/07/2017, 8:11 AM Pager: [phone] Consults: [phone] Mon-Fri 7:00 am-4:30 pm Sat-Sun 7:00 am-11:30 am

## 2017-09-07 NOTE — Discharge Instructions (Signed)
Follow with Robert Lima, MD in 2-3 weeks  Please get a complete blood count and chemistry panel checked by your Primary MD at your next visit, and again as instructed by your Primary MD. Please get your medications reviewed and adjusted by your Primary MD.  Please request your Primary MD to go over all Hospital Tests and Procedure/Radiological results at the follow up, please get all Hospital records sent to your Prim MD by signing hospital release before you go home.  If you had Pneumonia of Lung problems at the Hospital: Please get a 2 view Chest X ray done in 6-8 weeks after hospital discharge or sooner if instructed by your Primary MD.  If you have Congestive Heart Failure: Please call your Cardiologist or Primary MD anytime you have any of the following symptoms:  1) 3 pound weight gain in 24 hours or 5 pounds in 1 week  2) shortness of breath, with or without a dry hacking cough  3) swelling in the hands, feet or stomach  4) if you have to sleep on extra pillows at night in order to breathe  Follow cardiac low salt diet and 1.5 lit/day fluid restriction.  If you have diabetes Accuchecks 4 times/day, Once in AM empty stomach and then before each meal. Log in all results and show them to your primary doctor at your next visit. If any glucose reading is under 80 or above 300 call your primary MD immediately.  If you have Seizure/Convulsions/Epilepsy: Please do not drive, operate heavy machinery, participate in activities at heights or participate in high speed sports until you have seen by Primary MD or a Neurologist and advised to do so again.  If you had Gastrointestinal Bleeding: Please ask your Primary MD to check a complete blood count within one week of discharge or at your next visit. Your endoscopic/colonoscopic biopsies that are pending at the time of discharge, will also need to followed by your Primary MD.  Get Medicines reviewed and adjusted. Please take all your  medications with you for your next visit with your Primary MD  Please request your Primary MD to go over all hospital tests and procedure/radiological results at the follow up, please ask your Primary MD to get all Hospital records sent to his/her office.  If you experience worsening of your admission symptoms, develop shortness of breath, life threatening emergency, suicidal or homicidal thoughts you must seek medical attention immediately by calling 911 or calling your MD immediately  if symptoms less severe.  You must read complete instructions/literature along with all the possible adverse reactions/side effects for all the Medicines you take and that have been prescribed to you. Take any new Medicines after you have completely understood and accpet all the possible adverse reactions/side effects.   Do not drive or operate heavy machinery when taking Pain medications.   Do not take more than prescribed Pain, Sleep and Anxiety Medications  Special Instructions: If you have smoked or chewed Tobacco  in the last 2 yrs please stop smoking, stop any regular Alcohol  and or any Recreational drug use.  Wear Seat belts while driving.  Please note You were cared for by a hospitalist during your hospital stay. If you have any questions about your discharge medications or the care you received while you were in the hospital after you are discharged, you can call the unit and asked to speak with the hospitalist on call if the hospitalist that took care of you is not available.  Once you are discharged, your primary care physician will handle any further medical issues. Please note that NO REFILLS for any discharge medications will be authorized once you are discharged, as it is imperative that you return to your primary care physician (or establish a relationship with a primary care physician if you do not have one) for your aftercare needs so that they can reassess your need for medications and monitor your  lab values.  You can reach the hospitalist office at phone [phone] or fax [phone]   If you do not have a primary care physician, you can call [phone] for a physician referral.  Activity: As tolerated with Full fall precautions use walker/cane & assistance as needed  Diet: regular  Disposition Home

## 2017-09-07 NOTE — Progress Notes (Signed)
Calls made to both phone numbers listed in chart for daughter Carlyon Shadow. Voicemail left for each number to please return call. Patient is dressed and ready to be DC'd. Donne Hazel, RN

## 2017-09-07 NOTE — Progress Notes (Signed)
Discharge and medication instructions reviewed with son, Nilan. Questions answered; both deny further questions. One prescription given to be filled. Son is driving patient home.  Donne Hazel, RN

## 2017-09-08 ENCOUNTER — Telehealth: Payer: Self-pay | Admitting: *Deleted

## 2017-09-08 ENCOUNTER — Other Ambulatory Visit: Payer: Self-pay | Admitting: General Surgery

## 2017-09-08 DIAGNOSIS — K5720 Diverticulitis of large intestine with perforation and abscess without bleeding: Secondary | ICD-10-CM

## 2017-09-08 LAB — AEROBIC/ANAEROBIC CULTURE W GRAM STAIN (SURGICAL/DEEP WOUND)

## 2017-09-08 LAB — AEROBIC/ANAEROBIC CULTURE (SURGICAL/DEEP WOUND)

## 2017-09-08 NOTE — Telephone Encounter (Signed)
Pt was on TCM list admitted 09/02/17 for recurrent abscess and fistula. Zosyn, surgery consult. Pt had Abscess drain and drain placement 9/6. Pt d/c 09/07/17 and will f/u w/specialist  Ralene Ok, MD. Schedule an appointment as soon as possible for a visit in 4 week(s).   Specialty:  General Surgery

## 2017-09-11 ENCOUNTER — Other Ambulatory Visit: Payer: Self-pay | Admitting: Internal Medicine

## 2017-09-11 DIAGNOSIS — I6300 Cerebral infarction due to thrombosis of unspecified precerebral artery: Secondary | ICD-10-CM

## 2017-09-14 ENCOUNTER — Telehealth: Payer: Self-pay | Admitting: Internal Medicine

## 2017-09-14 NOTE — Telephone Encounter (Signed)
yes

## 2017-09-14 NOTE — Telephone Encounter (Signed)
Patient's daughter Robert Barnett dropped off FMLA forms to be filled out.  She is requesting FMLA, starting 09/08/17 to 09/17/17 Going back to work on 09/18/17. She has been taking care of her father while he recovered. Please advise.

## 2017-09-15 ENCOUNTER — Encounter (HOSPITAL_COMMUNITY): Payer: Self-pay

## 2017-09-15 ENCOUNTER — Other Ambulatory Visit: Payer: Self-pay | Admitting: Radiology

## 2017-09-15 ENCOUNTER — Ambulatory Visit (HOSPITAL_COMMUNITY)
Admission: RE | Admit: 2017-09-15 | Discharge: 2017-09-15 | Disposition: A | Discharge status: Home / Self Care | Payer: Medicare Other | Source: Ambulatory Visit | Attending: Radiology | Admitting: Radiology

## 2017-09-15 ENCOUNTER — Ambulatory Visit
Admission: RE | Admit: 2017-09-15 | Discharge: 2017-09-15 | Disposition: A | Discharge status: Home / Self Care | Payer: Medicare Other | Source: Ambulatory Visit | Attending: Radiology | Admitting: Radiology

## 2017-09-15 ENCOUNTER — Ambulatory Visit
Admission: RE | Admit: 2017-09-15 | Discharge: 2017-09-15 | Disposition: A | Discharge status: Home / Self Care | Payer: Medicare Other | Source: Ambulatory Visit | Attending: General Surgery | Admitting: General Surgery

## 2017-09-15 DIAGNOSIS — Z7982 Long term (current) use of aspirin: Secondary | ICD-10-CM | POA: Insufficient documentation

## 2017-09-15 DIAGNOSIS — I739 Peripheral vascular disease, unspecified: Secondary | ICD-10-CM | POA: Insufficient documentation

## 2017-09-15 DIAGNOSIS — T85628A Displacement of other specified internal prosthetic devices, implants and grafts, initial encounter: Secondary | ICD-10-CM | POA: Insufficient documentation

## 2017-09-15 DIAGNOSIS — K5720 Diverticulitis of large intestine with perforation and abscess without bleeding: Secondary | ICD-10-CM

## 2017-09-15 DIAGNOSIS — Z7902 Long term (current) use of antithrombotics/antiplatelets: Secondary | ICD-10-CM | POA: Diagnosis not present

## 2017-09-15 DIAGNOSIS — Z87891 Personal history of nicotine dependence: Secondary | ICD-10-CM | POA: Insufficient documentation

## 2017-09-15 DIAGNOSIS — E785 Hyperlipidemia, unspecified: Secondary | ICD-10-CM | POA: Insufficient documentation

## 2017-09-15 DIAGNOSIS — N400 Enlarged prostate without lower urinary tract symptoms: Secondary | ICD-10-CM | POA: Diagnosis not present

## 2017-09-15 DIAGNOSIS — Y828 Other medical devices associated with adverse incidents: Secondary | ICD-10-CM | POA: Insufficient documentation

## 2017-09-15 DIAGNOSIS — I6529 Occlusion and stenosis of unspecified carotid artery: Secondary | ICD-10-CM | POA: Diagnosis not present

## 2017-09-15 DIAGNOSIS — F0390 Unspecified dementia without behavioral disturbance: Secondary | ICD-10-CM | POA: Diagnosis not present

## 2017-09-15 DIAGNOSIS — I10 Essential (primary) hypertension: Secondary | ICD-10-CM | POA: Diagnosis not present

## 2017-09-15 DIAGNOSIS — M1990 Unspecified osteoarthritis, unspecified site: Secondary | ICD-10-CM | POA: Insufficient documentation

## 2017-09-15 DIAGNOSIS — K5780 Diverticulitis of intestine, part unspecified, with perforation and abscess without bleeding: Secondary | ICD-10-CM | POA: Diagnosis present

## 2017-09-15 DIAGNOSIS — I69351 Hemiplegia and hemiparesis following cerebral infarction affecting right dominant side: Secondary | ICD-10-CM | POA: Diagnosis not present

## 2017-09-15 HISTORY — PX: IR RADIOLOGIST EVAL & MGMT: IMG5224

## 2017-09-15 HISTORY — PX: IR CATHETER TUBE CHANGE: IMG717

## 2017-09-15 LAB — BASIC METABOLIC PANEL
ANION GAP: 7 (ref 5–15)
BUN: 24 mg/dL — ABNORMAL HIGH (ref 6–20)
CO2: 24 mmol/L (ref 22–32)
Calcium: 8.5 mg/dL — ABNORMAL LOW (ref 8.9–10.3)
Chloride: 109 mmol/L (ref 101–111)
Creatinine, Ser: 2.25 mg/dL — ABNORMAL HIGH (ref 0.61–1.24)
GFR calc Af Amer: 29 mL/min — ABNORMAL LOW (ref >60)
GFR, EST NON AFRICAN AMERICAN: 25 mL/min — AB (ref >60)
GLUCOSE: 87 mg/dL (ref 65–99)
POTASSIUM: 4.2 mmol/L (ref 3.5–5.1)
Sodium: 140 mmol/L (ref 135–145)

## 2017-09-15 LAB — CBC
HEMATOCRIT: 38.5 % — AB (ref 39.0–52.0)
HEMOGLOBIN: 12 g/dL — AB (ref 13.0–17.0)
MCH: 30.2 pg (ref 26.0–34.0)
MCHC: 31.2 g/dL (ref 30.0–36.0)
MCV: 97 fL (ref 78.0–100.0)
Platelets: 157 10*3/uL (ref 150–400)
RBC: 3.97 MIL/uL — ABNORMAL LOW (ref 4.22–5.81)
RDW: 15.4 % (ref 11.5–15.5)
WBC: 6.2 10*3/uL (ref 4.0–10.5)

## 2017-09-15 LAB — PROTIME-INR
INR: 1
Prothrombin Time: 13.1 seconds (ref 11.4–15.2)

## 2017-09-15 LAB — APTT: APTT: 23 s — AB (ref 24–36)

## 2017-09-15 MED ORDER — FENTANYL CITRATE (PF) 100 MCG/2ML IJ SOLN
INTRAMUSCULAR | Status: AC
  Filled 2017-09-15: qty 2

## 2017-09-15 MED ORDER — LIDOCAINE HCL (PF) 1 % IJ SOLN
INTRAMUSCULAR | Status: AC | PRN
  Administered 2017-09-15: 10 mL
  Administered 2017-09-15: 5 mL

## 2017-09-15 MED ORDER — MIDAZOLAM HCL 2 MG/2ML IJ SOLN
INTRAMUSCULAR | Status: AC
  Filled 2017-09-15: qty 2

## 2017-09-15 MED ORDER — IOPAMIDOL (ISOVUE-300) INJECTION 61%
INTRAVENOUS | Status: AC
Start: 2017-09-15 — End: 2017-09-15
  Administered 2017-09-15: 20 mL
  Filled 2017-09-15: qty 50

## 2017-09-15 MED ORDER — FENTANYL CITRATE (PF) 100 MCG/2ML IJ SOLN
INTRAMUSCULAR | Status: AC | PRN
  Administered 2017-09-15: 25 ug via INTRAVENOUS

## 2017-09-15 MED ORDER — LIDOCAINE HCL (PF) 1 % IJ SOLN
INTRAMUSCULAR | Status: AC
  Filled 2017-09-15: qty 30

## 2017-09-15 MED ORDER — SODIUM CHLORIDE 0.9 % IV SOLN: INTRAVENOUS | Status: DC

## 2017-09-15 MED ORDER — MIDAZOLAM HCL 2 MG/2ML IJ SOLN
INTRAMUSCULAR | Status: AC | PRN
  Administered 2017-09-15: 0.5 mg via INTRAVENOUS

## 2017-09-15 NOTE — H&P (Signed)
Chief Complaint: Patient was seen in consultation today for No chief complaint on file.  at the request of Dr Georgiann Cocker  Supervising Physician: Markus Daft  Patient Status: [facility] - Out-pt  History of Present Illness: Robert Barnett is a 83 y.o. male   Was seen in [facility] with Dr Pascal Lux today:  Robert Barnett is a 83 y.o. male with past medical history significant for stroke, peripheral vascular disease, hypertension, hyperlipidemia and dementia with long history of chronic diverticular abscess, chronically controlled with a percutaneous drainage catheter, initially placed on 11/18/2014. Given patient's poor operative candidacy, the diverticular abscess has been maintained with a chronic drain, however the drainage catheter was removed on 07/09/2016 secondary to malpositioning however the patient developed recurrent symptoms and as such underwent repeat CT-guided drainage catheter placement on 09/03/2017 by Dr. Barbie Banner.  Unfortunately, CT scan of the abdomen and pelvis performed today without intravenous contrast demonstrates retraction of the percutaneous drainage catheter with end coiled within the subcutaneous tissues of the ventral aspect of the left pelvis. There is an apparent persistent fistulous connection between the end of the drainage catheter and a residual ill-defined approximately 3.8 cm pelvic abscess which is again noted to abut the dome of the urinary bladder.  Attempt was made to perform a drain injection however during removal of the bandage, the drainage catheter was inadvertently cut.  As such, the patient was sent to [facility] to undergo fluoroscopic guided drainage catheter injection and repositioning.   Plan:  - As the patient is a poor operative candidate who has failed an attempted removal of the drainage catheter, the drainage catheter should be considered chronic.  - The patient should NO longer flush the percutaneous drainage catheter and does not have  to monitor drain out-put. - The patient does NOT need to return to the interventional radiology drain clinic, rather, patient should be scheduled for routine fluoroscopic guided exchanges and upsizing beginning on a q 3 months basis.  Given the patient's history of dementia, fluoroscopic guided exchanges will likely have to be performed with conscious sedation.  Scheduled now for diverticular abscess drain replacement   Past Medical History:  Diagnosis Date  . Arthritis   . BPH (benign prostatic hyperplasia)   . Carotid artery occlusion   . Dementia   . Dysphagia   . Hyperlipidemia   . Hypertension   . Peripheral vascular disease ([place])   . Stroke [facility])    Right hemispheric CVA  . Umbilical hernia     Past Surgical History:  Procedure Laterality Date  . CAROTID ENDARTERECTOMY  10/06/2007   Right CEA by Dr. Amedeo Plenty  . IR GENERIC HISTORICAL  07/09/2016   IR RADIOLOGIST EVAL & MGMT 07/09/2016 Arne Cleveland, MD [facility] INTERV RAD  . PEG TUBE PLACEMENT  2008   after CVA    Allergies: Patient has no known allergies.  Medications: Prior to Admission medications   Medication Sig Start Date End Date Taking? Authorizing Provider  amoxicillin-clavulanate (AUGMENTIN) 875-125 MG tablet Take 1 tablet by mouth 2 (two) times daily. 09/07/17   Caren Griffins, MD  aspirin 81 MG tablet Take 81 mg by mouth daily.    [provider]  benazepril (LOTENSIN) 40 MG tablet TAKE 1 TABLET BY MOUTH EVERY DAY 03/30/17   Janith Lima, MD  clopidogrel (PLAVIX) 75 MG tablet TAKE 1 TABLET (75 MG TOTAL) BY MOUTH DAILY. 09/12/17   Janith Lima, MD  Colchicine (MITIGARE) 0.6 MG CAPS Take  1 tablet by mouth 2 (two) times daily. 07/30/17   Janith Lima, MD  donepezil (ARICEPT) 10 MG tablet Take 1 tablet (10 mg total) by mouth at bedtime. 04/29/17   Cameron Sprang, MD  levothyroxine (SYNTHROID, LEVOTHROID) 50 MCG tablet TAKE 1 TABLET (50 MCG TOTAL) BY MOUTH DAILY. 05/29/17   Janith Lima, MD    simvastatin (ZOCOR) 20 MG tablet Take 20 mg by mouth daily.    [provider]     History reviewed. No pertinent family history.  Social History   Social History  . Marital status: Widowed    Spouse name: N/A  . Number of children: N/A  . Years of education: N/A   Occupational History  . Retired    Social History Main Topics  . Smoking status: Former Research scientist (life sciences)  . Smokeless tobacco: Never Used     Comment: remote h/o  . Alcohol use No  . Drug use: No  . Sexual activity: Not Asked   Other Topics Concern  . None   Social History Narrative  . None     Review of Systems: A 12 point ROS discussed and pertinent positives are indicated in the HPI above.  All other systems are negative.  Review of Systems  Constitutional: Negative for activity change, fatigue and fever.  Respiratory: Negative for shortness of breath.   Cardiovascular: Negative for chest pain.  Gastrointestinal: Negative for abdominal pain.  Psychiatric/Behavioral: Positive for confusion. Negative for behavioral problems.    Vital Signs: BP (!) 122/52 (BP Location: Right Arm)   Pulse (!) 49   Temp 97.6 F (36.4 C) (Oral)   Ht 5\' 6"  (1.676 m)   Wt 217 lb (98.4 kg)   SpO2 98%   BMI 35.02 kg/m   Physical Exam  Cardiovascular: Normal rate and regular rhythm.   Pulmonary/Chest: Effort normal and breath sounds normal.  Abdominal: Soft. Bowel sounds are normal.  Musculoskeletal: Normal range of motion.  Neurological: He is alert.  Skin: Skin is warm and dry.  Psychiatric: He has a normal mood and affect.  Consented with daughter at bedside  Nursing note and vitals reviewed.   Imaging: Ct Abdomen Pelvis Wo Contrast  Result Date: 09/15/2017 CLINICAL DATA:  Follow-up diverticular abscess. Patient with history of chronic diverticular abscess managed with a chronic percutaneous drainage catheter since 10/2014. The patient was deemed a non operative candidate and the chronic diverticular abscess  has been subsequent managed with a percutaneous drainage catheter. Unfortunately, recently, the patient's percutaneous drainage catheter was removed and as such, was subsequently replaced on 09/13/2017 by Dr. Barbie Banner. Patient presents to the IR drain Clinic for drainage catheter evaluation and management. Note, the patient has limited mental capacity and is accompanied by his sister who serves as his historian. The drainage catheter continues to be flushed by home nursing. The patient has NOT been maintaining output records from the percutaneous drainage catheter. EXAM: CT ABDOMEN AND PELVIS WITHOUT CONTRAST TECHNIQUE: Multidetector CT imaging of the abdomen and pelvis was performed following the standard protocol without IV contrast. COMPARISON:  CT abdomen pelvis - 09/02/2017; 07/09/2016; 09/27/2015 ; 05/09/2015 ; 11/17/2014; CT-guided percutaneous drainage catheter placement - 11/18/2014 ; 09/03/2017 FINDINGS: The lack of intravenous contrast limits the ability to evaluate solid abdominal organs. Lower chest: Limited visualization lower thorax demonstrates minimal dependent subpleural ground-glass atelectasis. No focal airspace opacities. No pleural effusion. Normal heart size. Coronary artery calcifications. No pericardial effusion. Hepatobiliary: Normal hepatic contour. Layering radiopaque stones within otherwise normal-appearing gallbladder.  No ascites. Pancreas: Normal noncontrast appearance of the pancreas Spleen: Normal noncontrast appearance of the spleen Adrenals/Urinary Tract: Re- demonstrated approximately 3.0 cm hypoattenuating lesion arising from the superior pole of the right kidney as well as the approximately 2.6 cm hypoattenuating lesion arising from the inferior pole the left kidney are both incompletely characterized on this noncontrast examination though both appear morphologically similar to the 10/2014 examination inter favored to represent renal cysts. No renal stones. No renal stones are seen  along the expected course of either ureter or the urinary bladder. Re- demonstrated diffuse thickening the urinary bladder wall, potentially accentuated to underdistention. Grossly unchanged mild bilateral renal atrophy. Grossly unchanged mild bilateral grossly symmetric likely age and body habitus related perinephric stranding. No urinary obstruction. Normal noncontrast appearance the bilateral adrenal glands. Stomach/Bowel: The patient's percutaneous drainage catheter has been retracted and is now coiled within the subcutaneous tissues of the ventral aspect of the left hemipelvis. There is a chronic fistulous tract from the end of the percutaneous drainage catheter to a recurrent ill-defined air in fluid collection within the ventral aspect of the pelvis which measures approximately 3.8 x 3.5 cm (image 79, series 3) and is again noted to abut the cranial aspect of the urinary bladder. This finding is again associated with adjacent colonic diverticulosis. Moderate colonic stool burden without evidence of enteric obstruction. Bowel is otherwise normal in course and caliber without wall thickening on this noncontrast examination. Normal noncontrast appearance of the terminal ileum normal noncontrast appearance of the retrocecal appendix. No pneumoperitoneum, pneumatosis or portal venous gas. Abutting the cranial aspect of the urinary bladder. Vascular/Lymphatic: Scattered atherosclerotic plaque within a normal caliber abdominal aorta. Re- demonstrated ectasia of the bilateral common iliac arteries with the right measuring approximately 2.7 cm and the left measuring approximately 2.4 cm (59, series 3), suboptimally evaluated due to patient motion. No bulky retroperitoneal, mesenteric, pelvic or inguinal lymphadenopathy on this noncontrast examination. Reproductive: Prostatomegaly with mass effect on the urinary bladder. No free fluid in the pelvic cul-de-sac. Other: Small bilateral mesenteric fat containing inguinal  hernias. Musculoskeletal: No acute or aggressive osseous abnormalities. Sequela of ankylosing spondylitis including fusion of the bilateral SI joints. Mild-to-moderate multilevel lumbar spine DDD. Suspected at least partial osseous fusion of the L5-S1 intervertebral disc space. IMPRESSION: 1. Retraction of chronic pelvic drainage catheter with end now coiled within the subcutaneous tissues. There is a suspected persistent chronic fistulous connection to a residual chronic pelvic fluid collection which measures approximately 3.8 cm and again abuts the dome of the urinary bladder. 2. Colonic diverticulosis.  No evidence of enteric obstruction. 3. Re- demonstrated mild ectasia of the bilateral common iliac arteries, right greater than left. 4. Aortic Atherosclerosis (ICD10-I70.0). PLAN: - Patient to undergo percutaneous drainage catheter injection and repositioning later today at [facility] with sedation. - The patient does NOT need to flush the percutaneous drainage catheter. - As the patient is not an operative candidate, this diverticular abscess will continue to be managed with a chronic percutaneous drainage catheter. As such, the patient does NOT need to return to drain clinic, but rather should be scheduled to undergo routine drainage catheter exchanges and upsizing, beginning initially on a q 3 month basis. Given the patient's limited mental capacity, these procedures well likely have to be performed with conscious sedation. Electronically Signed   By: Sandi Mariscal M.D.   On: 09/15/2017 11:06   Ct Abdomen Pelvis Wo Contrast  Result Date: 09/02/2017 CLINICAL DATA:  Abdominal pain with fever EXAM:  CT ABDOMEN AND PELVIS WITHOUT CONTRAST TECHNIQUE: Multidetector CT imaging of the abdomen and pelvis was performed following the standard protocol without IV contrast. COMPARISON:  07/09/2016, 07/09/2016, 09/27/2015 FINDINGS: Lower chest: Lung bases demonstrate no acute consolidation or pleural effusion. The  heart is nonenlarged. Hepatobiliary: Stable subcentimeter hypodensity within the dome of the left lobe. Multiple calcified gallstones. No biliary dilatation Pancreas: Unremarkable. No pancreatic ductal dilatation or surrounding inflammatory changes. Spleen: Normal in size without focal abnormality. Adrenals/Urinary Tract: Adrenal glands are within normal limits. Slightly atrophic kidneys with perinephric fat stranding. Bilateral renal cysts. No hydronephrosis. Bladder is thick-walled. Stomach/Bowel: The stomach is decompressed. No dilated small bowel. Sigmoid colon diverticular disease without acute wall thickening Vascular/Lymphatic: Aortic atherosclerosis. 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 No significantly enlarged abdominal or pelvic lymph nodes Reproductive: Slightly enlarged prostate Other: Negative for free air. Fat in the inguinal canals. There is moderate soft tissue stranding and inflammatory change within the anterior pelvic wall at the site of prior interventional catheter. There is a gas and fluid filled tract extending to the skin. This is contiguous with a gas and fluid collection that measures 4 x 3.2 cm, it is positions between the bladder and the sigmoid colon. It is concerning for a recurrent abscess. Suspect that there is residual fistula between the distal sigmoid colon and the bladder, series 2, image number 67 and series 4, image number 84 Musculoskeletal: Degenerative changes. No acute or suspicious bone lesion IMPRESSION: 1. 4 x 3.2 cm gas and fluid collection within the anterior pelvis, anterior to the dome of the bladder, concerning for a recurrent abscess. This is contiguous with a fluid and gas filled trach that extends to the skin surface, compatible with a fistula. There is moderate soft tissue stranding and inflammation  around the fistulous tract within the anterior pelvic subcutaneous soft tissues. 2. 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 3. Sigmoid colon diverticular disease 4. 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 Recommend followup by ultrasound in 1 year. This recommendation follows ACR consensus guidelines: White Paper of the ACR Incidental Findings Committee II on Vascular Findings. J Am Coll Radiol 2013; 10:789-794. 5. Multiple calcified gallstones Electronically Signed   By: Donavan Foil M.D.   On: 09/02/2017 22:52   Ct Image Guided Drainage By Percutaneous Catheter  Addendum Date: 09/15/2017   ADDENDUM REPORT: 09/15/2017 10:39 ADDENDUM: The indication read as follows: INDICATION: Pelvic abscess at the dome of the bladder. Electronically Signed   By: Marybelle Killings M.D.   On: 09/15/2017 10:39   Result Date: 09/15/2017 INDICATION: Pelvic abscess at the dome of the liver EXAM: CT GUIDED DRAINAGE OF PELVIC ABSCESS. CONTRAST INJECTION TO EVALUATE FOR FISTULA. MEDICATIONS: The patient is currently admitted to the hospital and receiving intravenous antibiotics. The antibiotics were administered within an appropriate time frame prior to the initiation of the procedure. ANESTHESIA/SEDATION: One mg IV Versed 150 mcg IV Fentanyl Moderate Sedation Time:  15 The patient was continuously monitored during the procedure by the interventional radiology nurse under my direct supervision. COMPLICATIONS: None immediate. TECHNIQUE: Informed written consent was obtained from the patient after a thorough discussion of the procedural risks, benefits and alternatives. All questions were addressed. Maximal Sterile Barrier Technique was utilized including caps, mask, sterile gowns,  sterile gloves, sterile drape, hand hygiene and skin antiseptic. A timeout was performed prior to the initiation of the procedure. PROCEDURE: The lower abdomen  was prepped with Betadine in a sterile fashion, and a sterile drape was applied covering the operative field. A sterile gown and sterile gloves were used for the procedure. Local anesthesia was provided with 1% Lidocaine. An 8 [place] dilator was advanced into the draining fistula at the lower abdominal location. A Bentson wire was advanced through the dilator and into the fluid collection. The dilator was advanced over the Bentson into the fluid collection. The Bentson was then exchanged for an Amplatz wire. A 12 French drain was then advanced over the Amplatz and coiled in the fluid collection. It was looped and string fixed then sewn to the skin. 15 cc pus was aspirated. 50 cc mixture of Isovue 300 and saline were then injected. FINDINGS: Imaging confirms replacement of the 92 French drain in the pelvic abscess at the dome of the liver. 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 There is no evidence of contrast in the bladder to suggest a fistula to the bladder. 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 There is no evidence of fistula to the bladder. Electronically Signed: By: Marybelle Killings M.D. On: 09/03/2017 15:20    Labs:  CBC:  Recent Labs  09/02/17 2232 09/02/17 2245 09/04/17 0533 09/05/17 0703 09/15/17 1241  WBC 8.2  --  5.1 6.4 6.2  HGB 12.0* 12.6* 11.0* 12.2* 12.0*  HCT 37.9* 37.0* 34.3* 38.4* 38.5*  PLT 157  --  137* 152 157    COAGS:  Recent Labs  09/03/17 1153 09/15/17 1241  INR 1.07 1.00  APTT  --  23*    BMP:  Recent Labs  09/02/17 2232 09/02/17 2245 09/04/17 0533 09/05/17 0703 09/15/17 1241  NA 140 142 139 138 140  K 5.0 5.0 4.2 4.4 4.2  CL 110 111 107 106 109  CO2 23  --  24 24 24   GLUCOSE 94 95 74 77 87  BUN 36* 40* 26* 21* 24*  CALCIUM 8.5*  --  8.4* 8.6*  8.5*  CREATININE 2.74* 2.70* 2.36* 2.28* 2.25*  GFRNONAA 20*  --  24* 25* 25*  GFRAA 23*  --  28* 29* 29*    LIVER FUNCTION TESTS:  Recent Labs  07/30/17 1642 09/02/17 2232  BILITOT 0.5 0.6  AST 15 32  ALT 9 17  ALKPHOS 65 70  PROT 7.8 7.2  ALBUMIN 3.4* 3.2*    TUMOR MARKERS: No results for input(s): AFPTM, CEA, CA199, CHROMGRNA in the last 8760 hours.  Assessment and Plan:  Diverticular abscess drain malpositioned and tubing damage Now scheduled for replacement in IR Risks and benefits discussed with the patient and daughter including bleeding, infection, damage to adjacent structures, bowel perforation/fistula connection, and sepsis. All of her questions were answered, she is agreeable to proceed. Consent signed and in chart.   Thank you for this interesting consult.  I greatly enjoyed meeting Robert Barnett and look forward to participating in their care.  A copy of this report was sent to the requesting provider on this date.  Electronically Signed: Lavonia Drafts, PA-C 09/15/2017, 1:32 PM   I spent a total of    25 Minutes in face to face in clinical consultation, greater  than 50% of which was counseling/coordinating care for diverticular abscess drain replacement

## 2017-09-15 NOTE — Discharge Instructions (Signed)
°  CONTINUE DRAIN MAINTENANCE AS BEFORE. RADIOLOGY TO FOLLOW UP WITH ANY FOLLOW UP CARE FOR THE FUTURE.  CALL FOR ANY SIGNS OR SYMPTOMS OF INFECTION OR ANY DYSFUNCTION OF THE CATHETER OR DRAINAGE BAG.  RADIOLOGY [phone]

## 2017-09-15 NOTE — Procedures (Signed)
Successful exchange and repositioning of pelvic drain.  12 Fr drain placed.  Persistent bowel fistula identified.  Patient will need a drain indefinitely.  Plan for drain exchange in 2-3 months, we will contact patient. Do not flush drain. Minimal blood loss and no immediate complication.

## 2017-09-15 NOTE — Progress Notes (Signed)
Patient ID: Robert Barnett, male   DOB: August 06, 1932, 83 y.o.   MRN: [id number]         Chief Complaint: Percutaneous Drainage catheter management.  Referring Physician(s): Kae Heller, C  History of Present Illness: Robert Barnett is a 83 y.o. male with past medical history significant for stroke, peripheral vascular disease, hypertension, hyperlipidemia and dementia with long history of chronic diverticular abscess, chronically controlled with a percutaneous drainage catheter, initially placed on 11/18/2014. Given patient's poor operative candidacy, the diverticular abscess has been maintained with a chronic drain, however the drainage catheter was removed on 07/09/2016 secondary to malpositioning however the patient developed recurrent symptoms and as such underwent repeat CT-guided drainage catheter placement on 09/03/2017 by Dr. Barbie Banner.  The patient presents today to the interventional radiology drain clinic for drainage catheter evaluation and management.   Given patient's history of dementia, he is accompanied by his sister who serves as his primary his historian.  The drainage catheter continues be flushed by home health nursing. Detailed records have not been maintained regarding the drainage catheter output so the patient's daughter says there has been output nearly every day.  Patient denies worsening abdominal pain, fever or chills.  Past Medical History:  Diagnosis Date  . Arthritis   . BPH (benign prostatic hyperplasia)   . Carotid artery occlusion   . Dementia   . Dysphagia   . Hyperlipidemia   . Hypertension   . Peripheral vascular disease ([place])   . Stroke [facility])    Right hemispheric CVA  . Umbilical hernia     Past Surgical History:  Procedure Laterality Date  . CAROTID ENDARTERECTOMY  10/06/2007   Right CEA by Dr. Amedeo Plenty  . IR GENERIC HISTORICAL  07/09/2016   IR RADIOLOGIST EVAL & MGMT 07/09/2016 Arne Cleveland, MD [facility] INTERV RAD  . PEG TUBE PLACEMENT  2008   after CVA     Allergies: Patient has no known allergies.  Medications: Prior to Admission medications   Medication Sig Start Date End Date Taking? Authorizing Provider  amoxicillin-clavulanate (AUGMENTIN) 875-125 MG tablet Take 1 tablet by mouth 2 (two) times daily. 09/07/17   Caren Griffins, MD  aspirin 81 MG tablet Take 81 mg by mouth daily.    [provider]  benazepril (LOTENSIN) 40 MG tablet TAKE 1 TABLET BY MOUTH EVERY DAY 03/30/17   Janith Lima, MD  clopidogrel (PLAVIX) 75 MG tablet TAKE 1 TABLET (75 MG TOTAL) BY MOUTH DAILY. 09/12/17   Janith Lima, MD  Colchicine (MITIGARE) 0.6 MG CAPS Take 1 tablet by mouth 2 (two) times daily. 07/30/17   Janith Lima, MD  donepezil (ARICEPT) 10 MG tablet Take 1 tablet (10 mg total) by mouth at bedtime. 04/29/17   Cameron Sprang, MD  levothyroxine (SYNTHROID, LEVOTHROID) 50 MCG tablet TAKE 1 TABLET (50 MCG TOTAL) BY MOUTH DAILY. 05/29/17   Janith Lima, MD  simvastatin (ZOCOR) 20 MG tablet Take 20 mg by mouth daily.    [provider]     No family history on file.  Social History   Social History  . Marital status: Widowed    Spouse name: N/A  . Number of children: N/A  . Years of education: N/A   Occupational History  . Retired    Social History Main Topics  . Smoking status: Former Research scientist (life sciences)  . Smokeless tobacco: Never Used     Comment: remote h/o  . Alcohol use No  . Drug use: No  .  Sexual activity: Not on file   Other Topics Concern  . Not on file   Social History Narrative  . No narrative on file    ECOG Status: 2 - Symptomatic, <50% confined to bed  Review of Systems: A 12 point ROS discussed and pertinent positives are indicated in the HPI above.  All other systems are negative.  Review of Systems  Constitutional: Negative for activity change, appetite change and fever.  Respiratory: Negative.   Cardiovascular: Negative.     Vital Signs: BP (!) 144/71   Pulse (!) 56   Temp 98.6 F (37 C)  (Oral)   SpO2 98%   Physical Exam  Abdominal:    Location of the patient's percutaneous drain     Imaging: Ct Abdomen Pelvis Wo Contrast  Result Date: 09/15/2017 CLINICAL DATA:  Follow-up diverticular abscess. Patient with history of chronic diverticular abscess managed with a chronic percutaneous drainage catheter since 10/2014. The patient was deemed a non operative candidate and the chronic diverticular abscess has been subsequent managed with a percutaneous drainage catheter. Unfortunately, recently, the patient's percutaneous drainage catheter was removed and as such, was subsequently replaced on 09/13/2017 by Dr. Barbie Banner. Patient presents to the IR drain Clinic for drainage catheter evaluation and management. Note, the patient has limited mental capacity and is accompanied by his sister who serves as his historian. The drainage catheter continues to be flushed by home nursing. The patient has NOT been maintaining output records from the percutaneous drainage catheter. EXAM: CT ABDOMEN AND PELVIS WITHOUT CONTRAST TECHNIQUE: Multidetector CT imaging of the abdomen and pelvis was performed following the standard protocol without IV contrast. COMPARISON:  CT abdomen pelvis - 09/02/2017; 07/09/2016; 09/27/2015 ; 05/09/2015 ; 11/17/2014; CT-guided percutaneous drainage catheter placement - 11/18/2014 ; 09/03/2017 FINDINGS: The lack of intravenous contrast limits the ability to evaluate solid abdominal organs. Lower chest: Limited visualization lower thorax demonstrates minimal dependent subpleural ground-glass atelectasis. No focal airspace opacities. No pleural effusion. Normal heart size. Coronary artery calcifications. No pericardial effusion. Hepatobiliary: Normal hepatic contour. Layering radiopaque stones within otherwise normal-appearing gallbladder. No ascites. Pancreas: Normal noncontrast appearance of the pancreas Spleen: Normal noncontrast appearance of the spleen Adrenals/Urinary Tract: Re-  demonstrated approximately 3.0 cm hypoattenuating lesion arising from the superior pole of the right kidney as well as the approximately 2.6 cm hypoattenuating lesion arising from the inferior pole the left kidney are both incompletely characterized on this noncontrast examination though both appear morphologically similar to the 10/2014 examination inter favored to represent renal cysts. No renal stones. No renal stones are seen along the expected course of either ureter or the urinary bladder. Re- demonstrated diffuse thickening the urinary bladder wall, potentially accentuated to underdistention. Grossly unchanged mild bilateral renal atrophy. Grossly unchanged mild bilateral grossly symmetric likely age and body habitus related perinephric stranding. No urinary obstruction. Normal noncontrast appearance the bilateral adrenal glands. Stomach/Bowel: The patient's percutaneous drainage catheter has been retracted and is now coiled within the subcutaneous tissues of the ventral aspect of the left hemipelvis. There is a chronic fistulous tract from the end of the percutaneous drainage catheter to a recurrent ill-defined air in fluid collection within the ventral aspect of the pelvis which measures approximately 3.8 x 3.5 cm (image 79, series 3) and is again noted to abut the cranial aspect of the urinary bladder. This finding is again associated with adjacent colonic diverticulosis. Moderate colonic stool burden without evidence of enteric obstruction. Bowel is otherwise normal in course and caliber without wall thickening  on this noncontrast examination. Normal noncontrast appearance of the terminal ileum normal noncontrast appearance of the retrocecal appendix. No pneumoperitoneum, pneumatosis or portal venous gas. Abutting the cranial aspect of the urinary bladder. Vascular/Lymphatic: Scattered atherosclerotic plaque within a normal caliber abdominal aorta. Re- demonstrated ectasia of the bilateral common iliac  arteries with the right measuring approximately 2.7 cm and the left measuring approximately 2.4 cm (59, series 3), suboptimally evaluated due to patient motion. No bulky retroperitoneal, mesenteric, pelvic or inguinal lymphadenopathy on this noncontrast examination. Reproductive: Prostatomegaly with mass effect on the urinary bladder. No free fluid in the pelvic cul-de-sac. Other: Small bilateral mesenteric fat containing inguinal hernias. Musculoskeletal: No acute or aggressive osseous abnormalities. Sequela of ankylosing spondylitis including fusion of the bilateral SI joints. Mild-to-moderate multilevel lumbar spine DDD. Suspected at least partial osseous fusion of the L5-S1 intervertebral disc space. IMPRESSION: 1. Retraction of chronic pelvic drainage catheter with end now coiled within the subcutaneous tissues. There is a suspected persistent chronic fistulous connection to a residual chronic pelvic fluid collection which measures approximately 3.8 cm and again abuts the dome of the urinary bladder. 2. Colonic diverticulosis.  No evidence of enteric obstruction. 3. Re- demonstrated mild ectasia of the bilateral common iliac arteries, right greater than left. 4. Aortic Atherosclerosis (ICD10-I70.0). PLAN: - Patient to undergo percutaneous drainage catheter injection and repositioning later today at [facility] with sedation. - The patient does NOT need to flush the percutaneous drainage catheter. - As the patient is not an operative candidate, this diverticular abscess will continue to be managed with a chronic percutaneous drainage catheter. As such, the patient does NOT need to return to drain clinic, but rather should be scheduled to undergo routine drainage catheter exchanges and upsizing, beginning initially on a q 3 month basis. Given the patient's limited mental capacity, these procedures well likely have to be performed with conscious sedation. Electronically Signed   By: Sandi Mariscal M.D.   On:  09/15/2017 11:06   Ct Abdomen Pelvis Wo Contrast  Result Date: 09/02/2017 CLINICAL DATA:  Abdominal pain with fever EXAM: CT ABDOMEN AND PELVIS WITHOUT CONTRAST TECHNIQUE: Multidetector CT imaging of the abdomen and pelvis was performed following the standard protocol without IV contrast. COMPARISON:  07/09/2016, 07/09/2016, 09/27/2015 FINDINGS: Lower chest: Lung bases demonstrate no acute consolidation or pleural effusion. The heart is nonenlarged. Hepatobiliary: Stable subcentimeter hypodensity within the dome of the left lobe. Multiple calcified gallstones. No biliary dilatation Pancreas: Unremarkable. No pancreatic ductal dilatation or surrounding inflammatory changes. Spleen: Normal in size without focal abnormality. Adrenals/Urinary Tract: Adrenal glands are within normal limits. Slightly atrophic kidneys with perinephric fat stranding. Bilateral renal cysts. No hydronephrosis. Bladder is thick-walled. Stomach/Bowel: The stomach is decompressed. No dilated small bowel. Sigmoid colon diverticular disease without acute wall thickening Vascular/Lymphatic: Aortic atherosclerosis. 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 No significantly enlarged abdominal or pelvic lymph nodes Reproductive: Slightly enlarged prostate Other: Negative for free air. Fat in the inguinal canals. There is moderate soft tissue stranding and inflammatory change within the anterior pelvic wall at the site of prior interventional catheter. There is a gas and fluid filled tract extending to the skin. This is contiguous with a gas and fluid collection that measures 4 x 3.2 cm, it is positions between the bladder and the sigmoid colon. It is concerning for a recurrent abscess. Suspect that  there is residual fistula between the distal sigmoid colon and the bladder, series 2, image number  67 and series 4, image number 84 Musculoskeletal: Degenerative changes. No acute or suspicious bone lesion IMPRESSION: 1. 4 x 3.2 cm gas and fluid collection within the anterior pelvis, anterior to the dome of the bladder, concerning for a recurrent abscess. This is contiguous with a fluid and gas filled trach that extends to the skin surface, compatible with a fistula. There is moderate soft tissue stranding and inflammation around the fistulous tract within the anterior pelvic subcutaneous soft tissues. 2. 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 3. Sigmoid colon diverticular disease 4. 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 Recommend followup by ultrasound in 1 year. This recommendation follows ACR consensus guidelines: White Paper of the ACR Incidental Findings Committee II on Vascular Findings. J Am Coll Radiol 2013; 10:789-794. 5. Multiple calcified gallstones Electronically Signed   By: Donavan Foil M.D.   On: 09/02/2017 22:52   Ct Image Guided Drainage By Percutaneous Catheter  Addendum Date: 09/15/2017   ADDENDUM REPORT: 09/15/2017 10:39 ADDENDUM: The indication read as follows: INDICATION: Pelvic abscess at the dome of the bladder. Electronically Signed   By: Marybelle Killings M.D.   On: 09/15/2017 10:39   Result Date: 09/15/2017 INDICATION: Pelvic abscess at the dome of the liver EXAM: CT GUIDED DRAINAGE OF PELVIC ABSCESS. CONTRAST INJECTION TO EVALUATE FOR FISTULA. MEDICATIONS: The patient is currently admitted to the hospital and receiving intravenous antibiotics. The antibiotics were administered within an appropriate time frame prior to the initiation of the procedure. ANESTHESIA/SEDATION: One mg IV Versed 150 mcg IV Fentanyl Moderate Sedation Time:  15 The patient was continuously monitored during the procedure by the interventional radiology nurse under my direct supervision. COMPLICATIONS: None  immediate. TECHNIQUE: Informed written consent was obtained from the patient after a thorough discussion of the procedural risks, benefits and alternatives. All questions were addressed. Maximal Sterile Barrier Technique was utilized including caps, mask, sterile gowns, sterile gloves, sterile drape, hand hygiene and skin antiseptic. A timeout was performed prior to the initiation of the procedure. PROCEDURE: The lower abdomen was prepped with Betadine in a sterile fashion, and a sterile drape was applied covering the operative field. A sterile gown and sterile gloves were used for the procedure. Local anesthesia was provided with 1% Lidocaine. An 8 [place] dilator was advanced into the draining fistula at the lower abdominal location. A Bentson wire was advanced through the dilator and into the fluid collection. The dilator was advanced over the Bentson into the fluid collection. The Bentson was then exchanged for an Amplatz wire. A 12 French drain was then advanced over the Amplatz and coiled in the fluid collection. It was looped and string fixed then sewn to the skin. 15 cc pus was aspirated. 50 cc mixture of Isovue 300 and saline were then injected. FINDINGS: Imaging confirms replacement of the 79 French drain in the pelvic abscess at the dome of the liver. 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 There is no evidence of contrast in the bladder to suggest a fistula to the bladder. 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 There is no evidence of fistula to the bladder. Electronically Signed: By:  Marybelle Killings M.D. On: 09/03/2017 15:20    Labs:  CBC:  Recent Labs  07/30/17 1642 09/02/17 2232 09/02/17 2245 09/04/17 0533 09/05/17 0703  WBC 7.5 8.2  --  5.1 6.4  HGB 12.2* 12.0* 12.6* 11.0* 12.2*  HCT  38.2* 37.9* 37.0* 34.3* 38.4*  PLT 249.0 157  --  137* 152    COAGS:  Recent Labs  09/03/17 1153  INR 1.07    BMP:  Recent Labs  07/30/17 1642 09/02/17 2232 09/02/17 2245 09/04/17 0533 09/05/17 0703  NA 141 140 142 139 138  K 4.9 5.0 5.0 4.2 4.4  CL 109 110 111 107 106  CO2 24 23  --  24 24  GLUCOSE 98 94 95 74 77  BUN 43* 36* 40* 26* 21*  CALCIUM 8.7 8.5*  --  8.4* 8.6*  CREATININE 2.98* 2.74* 2.70* 2.36* 2.28*  GFRNONAA  --  20*  --  24* 25*  GFRAA  --  23*  --  28* 29*    LIVER FUNCTION TESTS:  Recent Labs  07/30/17 1642 09/02/17 2232  BILITOT 0.5 0.6  AST 15 32  ALT 9 17  ALKPHOS 65 70  PROT 7.8 7.2  ALBUMIN 3.4* 3.2*    TUMOR MARKERS: No results for input(s): AFPTM, CEA, CA199, CHROMGRNA in the last 8760 hours.  Assessment and Plan:  Robert Barnett is a 83 y.o. male with past medical history significant for stroke, peripheral vascular disease, hypertension, hyperlipidemia and dementia with long history of chronic diverticular abscess, chronically controlled with a percutaneous drainage catheter, initially placed on 11/18/2014. Given patient's poor operative candidacy, the diverticular abscess has been maintained with a chronic drain, however the drainage catheter was removed on 07/09/2016 secondary to malpositioning however the patient developed recurrent symptoms and as such underwent repeat CT-guided drainage catheter placement on 09/03/2017 by Dr. Barbie Banner.  Unfortunately, CT scan of the abdomen and pelvis performed today without intravenous contrast demonstrates retraction of the percutaneous drainage catheter with end coiled within the subcutaneous tissues of the ventral aspect of the left pelvis. There is an apparent persistent fistulous connection between the end of the drainage catheter and a residual ill-defined approximately 3.8 cm pelvic abscess which is again noted to abut the dome of the urinary bladder.  Attempt was made to perform a drain injection  however during removal of the bandage, the drainage catheter was inadvertently cut.  As such, the patient was sent to [facility] to undergo fluoroscopic guided drainage catheter injection and repositioning.   Plan:  - As the patient is a poor operative candidate who has failed an attempted removal of the drainage catheter, the drainage catheter should be considered chronic.  - The patient should NO longer flush the percutaneous drainage catheter and does not have to monitor drain out-put. - The patient does NOT need to return to the interventional radiology drain clinic, rather, patient should be scheduled for routine fluoroscopic guided exchanges and upsizing beginning on a q 3 months basis.  Given the patient's history of dementia, fluoroscopic guided exchanges will likely have to be performed with conscious sedation.  A copy of this report was sent to the requesting provider on this date.  Electronically Signed: Sandi Mariscal 09/15/2017, 11:44 AM   I spent a total of 10 Minutes in face to face in clinical consultation, greater than 50% of which was counseling/coordinating care for Percutaneous drainage catheter management

## 2017-09-16 ENCOUNTER — Encounter (HOSPITAL_COMMUNITY): Payer: Self-pay | Admitting: Diagnostic Radiology

## 2017-09-17 ENCOUNTER — Other Ambulatory Visit: Payer: Self-pay | Admitting: Student

## 2017-09-17 DIAGNOSIS — K5732 Diverticulitis of large intestine without perforation or abscess without bleeding: Secondary | ICD-10-CM

## 2017-09-18 NOTE — Telephone Encounter (Signed)
Forms have been completed&faxed, copy sent to scan, &charged for. LVM to inform daughter it is ready to pick or I could mail it.

## 2017-09-24 ENCOUNTER — Other Ambulatory Visit: Payer: Self-pay | Admitting: Internal Medicine

## 2017-09-29 ENCOUNTER — Emergency Department (HOSPITAL_COMMUNITY)
Admission: EM | Admit: 2017-09-29 | Discharge: 2017-09-30 | Disposition: A | Discharge status: Home / Self Care | Payer: Medicare Other | Attending: Emergency Medicine | Admitting: Emergency Medicine

## 2017-09-29 DIAGNOSIS — Z87891 Personal history of nicotine dependence: Secondary | ICD-10-CM | POA: Diagnosis not present

## 2017-09-29 DIAGNOSIS — K5780 Diverticulitis of intestine, part unspecified, with perforation and abscess without bleeding: Secondary | ICD-10-CM | POA: Diagnosis not present

## 2017-09-29 DIAGNOSIS — Z8673 Personal history of transient ischemic attack (TIA), and cerebral infarction without residual deficits: Secondary | ICD-10-CM | POA: Insufficient documentation

## 2017-09-29 DIAGNOSIS — I129 Hypertensive chronic kidney disease with stage 1 through stage 4 chronic kidney disease, or unspecified chronic kidney disease: Secondary | ICD-10-CM | POA: Insufficient documentation

## 2017-09-29 DIAGNOSIS — Z7982 Long term (current) use of aspirin: Secondary | ICD-10-CM | POA: Diagnosis not present

## 2017-09-29 DIAGNOSIS — N184 Chronic kidney disease, stage 4 (severe): Secondary | ICD-10-CM | POA: Diagnosis not present

## 2017-09-29 DIAGNOSIS — L089 Local infection of the skin and subcutaneous tissue, unspecified: Secondary | ICD-10-CM | POA: Diagnosis present

## 2017-09-29 DIAGNOSIS — K5720 Diverticulitis of large intestine with perforation and abscess without bleeding: Secondary | ICD-10-CM

## 2017-09-29 LAB — CBC WITH DIFFERENTIAL/PLATELET
BASOS ABS: 0.1 10*3/uL (ref 0.0–0.1)
Basophils Relative: 1 %
EOS ABS: 0.1 10*3/uL (ref 0.0–0.7)
Eosinophils Relative: 1 %
HCT: 39.2 % (ref 39.0–52.0)
HEMOGLOBIN: 12.4 g/dL — AB (ref 13.0–17.0)
LYMPHS PCT: 24 %
Lymphs Abs: 1.9 10*3/uL (ref 0.7–4.0)
MCH: 31.3 pg (ref 26.0–34.0)
MCHC: 31.6 g/dL (ref 30.0–36.0)
MCV: 99 fL (ref 78.0–100.0)
MONO ABS: 0.5 10*3/uL (ref 0.1–1.0)
Monocytes Relative: 6 %
Neutro Abs: 5.4 10*3/uL (ref 1.7–7.7)
Neutrophils Relative %: 68 %
PLATELETS: 145 10*3/uL — AB (ref 150–400)
RBC: 3.96 MIL/uL — AB (ref 4.22–5.81)
RDW: 15.5 % (ref 11.5–15.5)
WBC: 8 10*3/uL (ref 4.0–10.5)

## 2017-09-29 LAB — COMPREHENSIVE METABOLIC PANEL
ALBUMIN: 3.2 g/dL — AB (ref 3.5–5.0)
ALT: 14 U/L — AB (ref 17–63)
AST: 22 U/L (ref 15–41)
Alkaline Phosphatase: 86 U/L (ref 38–126)
Anion gap: 7 (ref 5–15)
BUN: 22 mg/dL — AB (ref 6–20)
CHLORIDE: 108 mmol/L (ref 101–111)
CO2: 25 mmol/L (ref 22–32)
CREATININE: 2.18 mg/dL — AB (ref 0.61–1.24)
Calcium: 8.7 mg/dL — ABNORMAL LOW (ref 8.9–10.3)
GFR calc non Af Amer: 26 mL/min — ABNORMAL LOW (ref >60)
GFR, EST AFRICAN AMERICAN: 30 mL/min — AB (ref >60)
GLUCOSE: 91 mg/dL (ref 65–99)
Potassium: 4.7 mmol/L (ref 3.5–5.1)
SODIUM: 140 mmol/L (ref 135–145)
Total Bilirubin: 0.5 mg/dL (ref 0.3–1.2)
Total Protein: 6.9 g/dL (ref 6.5–8.1)

## 2017-09-29 NOTE — ED Triage Notes (Signed)
Pt has abd surg on 9/18 to place a tube for an abd abscess.  Daughter st's wound has been draining a green drainage with foul smell.  Pt denies any pain

## 2017-09-30 ENCOUNTER — Emergency Department (HOSPITAL_COMMUNITY): Payer: Medicare Other

## 2017-09-30 NOTE — Discharge Instructions (Signed)
You were seen today with concerns for foul-smelling drainage around your drain. This is likely related to the known abscess. There is no signs of systemic infection. You may need a larger drain if you continue to have drainage around the drain. Follow-up with general surgery for reassessment. If you develop fevers, pain or any new or worsening symptoms you should be reevaluated.

## 2017-09-30 NOTE — ED Notes (Signed)
Patient transported to CT 

## 2017-09-30 NOTE — ED Provider Notes (Signed)
[facility] DEPT Provider Note   CSN: [id number] Arrival date & time: 09/29/17  1711     History   Chief Complaint Chief Complaint  Patient presents with  . Wound Infection    HPI Robert Barnett is a 83 y.o. male.  HPI  This is an 83 year old male with a history of dementia, hyperlipidemia, hypertension, chronic diverticular abscess with drain who presents with foul-smelling drainage. Per the patient's daughter, she noted increasing drainage around the percutaneous drain on Saturday. It was green in color. It developed a foul-smelling odor. She has also noted some decrease in drainage within the collection bag of the drain. Patient had a new drain placed on 9/18 by interventional radiology. He has not had any fevers or abdominal pain. Patient has a history of dementia but is very pleasant and denies any complaints at this time. He followed up with Dr. Rosendo Gros. He was recently hospitalized when the drain was malpositioned. At that time he was discharged on a ten-day course of Augmentin. He has finished that.  Past Medical History:  Diagnosis Date  . Arthritis   . BPH (benign prostatic hyperplasia)   . Carotid artery occlusion   . Dementia   . Dysphagia   . Hyperlipidemia   . Hypertension   . Peripheral vascular disease ([place])   . Stroke [facility])    Right hemispheric CVA  . Umbilical hernia     Patient Active Problem List   Diagnosis Date Noted  . 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 CKD (chronic kidney disease) stage 4, GFR 15-29 ml/min (HCC) 09/03/2017  . AAA (abdominal aortic aneurysm) ([place]) 09/03/2017  . Abdominal wall abscess 09/02/2017  . Essential hypertension 07/30/2017  . Acute idiopathic gout of right foot 07/30/2017  . Abdominal aortic atherosclerosis ([place]) 08/19/2016  . Bilateral low back pain without sciatica 08/18/2016  . Hyperglycemia 07/23/2016  . Hypothyroidism 07/23/2016  . Moderate dementia without behavioral disturbance 07/23/2016  . Abdominal fistula  11/22/2014  . Hyperlipidemia 11/17/2014  . Dysphagia   . Stroke ([place])   . BPH (benign prostatic hyperplasia)     Past Surgical History:  Procedure Laterality Date  . CAROTID ENDARTERECTOMY  10/06/2007   Right CEA by Dr. Amedeo Plenty  . IR CATHETER TUBE CHANGE  09/15/2017  . IR GENERIC HISTORICAL  07/09/2016   IR RADIOLOGIST EVAL & MGMT 07/09/2016 Arne Cleveland, MD [facility] INTERV RAD  . PEG TUBE PLACEMENT  2008   after CVA       Home Medications    Prior to Admission medications   Medication Sig Start Date End Date Taking? Authorizing Provider  aspirin 81 MG tablet Take 81 mg by mouth daily.   Yes [provider]  benazepril (LOTENSIN) 40 MG tablet TAKE 1 TABLET BY MOUTH EVERY DAY 09/24/17  Yes Janith Lima, MD  clopidogrel (PLAVIX) 75 MG tablet TAKE 1 TABLET (75 MG TOTAL) BY MOUTH DAILY. 09/12/17  Yes Janith Lima, MD  Colchicine (MITIGARE) 0.6 MG CAPS Take 1 tablet by mouth 2 (two) times daily. 07/30/17  Yes Janith Lima, MD  donepezil (ARICEPT) 10 MG tablet Take 1 tablet (10 mg total) by mouth at bedtime. 04/29/17  Yes Cameron Sprang, MD  levothyroxine (SYNTHROID, LEVOTHROID) 50 MCG tablet TAKE 1 TABLET (50 MCG TOTAL) BY MOUTH DAILY. 05/29/17  Yes Janith Lima, MD  simvastatin (ZOCOR) 20 MG tablet Take 20 mg by mouth daily.   Yes [provider]    Family History No family history on file.  Social History Social History  Substance Use Topics  . Smoking status: Former Research scientist (life sciences)  . Smokeless tobacco: Never Used     Comment: remote h/o  . Alcohol use No     Allergies   Patient has no known allergies.   Review of Systems Review of Systems  Constitutional: Negative for fever.  Respiratory: Negative for shortness of breath.   Cardiovascular: Negative for chest pain.  Gastrointestinal: Negative for abdominal pain, nausea and vomiting.  Genitourinary: Negative for dysuria.  Skin: Negative for color change.  All other systems reviewed and are  negative.    Physical Exam Updated Vital Signs BP (!) 109/52   Pulse 60   Temp 98.6 F (37 C) (Oral)   Resp 16   Ht 5\' 6"  (1.676 m)   Wt 98.4 kg (217 lb)   SpO2 96%   BMI 35.02 kg/m   Physical Exam  Constitutional: He appears well-developed and well-nourished.  Elderly, no acute distress  HENT:  Head: Normocephalic and atraumatic.  Cardiovascular: Normal rate, regular rhythm and normal heart sounds.   No murmur heard. Pulmonary/Chest: Effort normal and breath sounds normal. No respiratory distress. He has no wheezes.  Abdominal: Soft. Bowel sounds are normal. There is no tenderness. There is no rebound and no guarding.  Percutaneous drain in the left lower quadrant with purulent material draining. There is also some purulence at the skin around the drain, no fluctuance or erythema noted, foul-smelling  Musculoskeletal: He exhibits no edema.  Neurological: He is alert.  Skin: Skin is warm and dry.  Psychiatric: He has a normal mood and affect.  Nursing note and vitals reviewed.    ED Treatments / Results  Labs (all labs ordered are listed, but only abnormal results are displayed) Labs Reviewed  CBC WITH DIFFERENTIAL/PLATELET - Abnormal; Notable for the following:       Result Value   RBC 3.96 (*)    Hemoglobin 12.4 (*)    Platelets 145 (*)    All other components within normal limits  COMPREHENSIVE METABOLIC PANEL - Abnormal; Notable for the following:    BUN 22 (*)    Creatinine, Ser 2.18 (*)    Calcium 8.7 (*)    Albumin 3.2 (*)    ALT 14 (*)    GFR calc non Af Amer 26 (*)    GFR calc Af Amer 30 (*)    All other components within normal limits    EKG  EKG Interpretation None       Radiology Ct Abdomen Pelvis Wo Contrast  Result Date: 09/30/2017 CLINICAL DATA:  Left lower quadrant abdominal pain and fever for 1 week. History of umbilical hernia. EXAM: CT ABDOMEN AND PELVIS WITHOUT CONTRAST TECHNIQUE: Multidetector CT imaging of the abdomen and pelvis  was performed following the standard protocol without IV contrast. COMPARISON:  09/15/2017 FINDINGS: Lower chest: Mild dependent changes in the lung bases. Calcification in the aortic valve. Hepatobiliary: Cholelithiasis with multiple stones in the gallbladder. No inflammatory changes or wall thickening. No bile duct dilatation. No focal liver lesions identified on noncontrast imaging. Pancreas: Unremarkable. No pancreatic ductal dilatation or surrounding inflammatory changes. Spleen: Normal in size without focal abnormality. Adrenals/Urinary Tract: No adrenal gland nodules. Bilateral renal atrophy. Cysts in the midportion right kidney and lower pole left kidney. No hydronephrosis or hydroureter. No renal, ureteral, or bladder stones identified. Bladder wall is thickened, possibly indicating cystitis or hypertrophy. Stomach/Bowel: The stomach, small bowel, and colon are not abnormally distended. Stool throughout the colon.  Diverticulosis of the sigmoid colon. Stranding around the mid sigmoid region consistent with diverticulitis. There is a 2.4 cm gas collection anterior to the sigmoid colon with stranding around, likely representing an abscess cavity. There is a pigtail drainage catheter within the cavity. No undrained collections are identified. Appendix is normal. Vascular/Lymphatic: Normal caliber abdominal aorta with calcification. Bilateral iliac artery aneurysms, measuring 2.8 cm on the right hand 2.1 cm on the left. No significant lymphadenopathy. Reproductive: Prostate gland is diffusely enlarged, measuring 5.8 cm diameter. Other: No free air or free fluid in the abdomen. Stranding in the left anterior pelvic wall corresponding to catheter insertion site. Minimal umbilical hernia containing fat. Musculoskeletal: Degenerative changes in the spine. No destructive bone lesions. IMPRESSION: 1. Diverticulosis of the sigmoid colon. Stranding around the sigmoid region anteriorly consistent with diverticulitis. Gas  collection anterior to the sigmoid colon consistent with abscess. Pigtail drainage catheter within the cavity. No undrained collections identified. 2. Cholelithiasis without evidence of cholecystitis. 3. Aortic atherosclerosis.  Bilateral iliac artery aneurysms. 4. Enlarged prostate gland. 5. Bladder wall thickening may indicate cystitis or hypertrophy. Electronically Signed   By: Lucienne Capers M.D.   On: 09/30/2017 00:49    Procedures Procedures (including critical care time)  Medications Ordered in ED Medications - No data to display   Initial Impression / Assessment and Plan / ED Course  I have reviewed the triage vital signs and the nursing notes.  Pertinent labs & imaging results that were available during my care of the patient were reviewed by me and considered in my medical decision making (see chart for details).     Patient presents with foul-smelling drainage around a drain which is currently draining a chronic diverticular abscess. He is nontoxic on exam. Afebrile. No significant tenderness. No evidence of skin infection.  There is foul-smelling drainage about the drain at the skin. Suspect this is likely related to the draining abscess. Repeat CT scan obtained and shows appropriate placement of the drain. Additionally patient has no significant leukocytosis. Family is very concerned regarding the foul-smelling and drainage around the drain. They're reassured that this is likely drainage from the same abscess. He may need placement of a larger drain if he continues to have increasing reliance about the drain. I did discuss the patient with Dr.Tsuei who agrees that antibiotics are not indicated. He may need a larger drain and can follow-up as an outpatient.  After history, exam, and medical workup I feel the patient has been appropriately medically screened and is safe for discharge home. Pertinent diagnoses were discussed with the patient. Patient was given return  precautions.   Final Clinical Impressions(s) / ED Diagnoses   Final diagnoses:  Colonic diverticular abscess    New Prescriptions New Prescriptions   No medications on file     Merryl Hacker, MD 09/30/17 0140

## 2017-10-12 ENCOUNTER — Encounter: Payer: Self-pay | Admitting: Interventional Radiology

## 2017-11-23 ENCOUNTER — Other Ambulatory Visit: Payer: Self-pay | Admitting: Internal Medicine

## 2017-11-23 DIAGNOSIS — E039 Hypothyroidism, unspecified: Secondary | ICD-10-CM

## 2017-11-30 ENCOUNTER — Other Ambulatory Visit: Payer: Self-pay | Admitting: Internal Medicine

## 2017-11-30 DIAGNOSIS — M10071 Idiopathic gout, right ankle and foot: Secondary | ICD-10-CM

## 2017-12-02 ENCOUNTER — Telehealth (HOSPITAL_COMMUNITY): Payer: Self-pay

## 2017-12-03 ENCOUNTER — Ambulatory Visit (HOSPITAL_COMMUNITY)
Admission: RE | Admit: 2017-12-03 | Discharge: 2017-12-03 | Disposition: A | Discharge status: Home / Self Care | Payer: Medicare Other | Source: Ambulatory Visit | Attending: Student | Admitting: Student

## 2017-12-03 ENCOUNTER — Encounter (HOSPITAL_COMMUNITY): Payer: Self-pay | Admitting: Interventional Radiology

## 2017-12-03 DIAGNOSIS — K5720 Diverticulitis of large intestine with perforation and abscess without bleeding: Secondary | ICD-10-CM | POA: Diagnosis not present

## 2017-12-03 DIAGNOSIS — Z4803 Encounter for change or removal of drains: Secondary | ICD-10-CM | POA: Diagnosis present

## 2017-12-03 DIAGNOSIS — K5732 Diverticulitis of large intestine without perforation or abscess without bleeding: Secondary | ICD-10-CM

## 2017-12-03 HISTORY — PX: IR CATHETER TUBE CHANGE: IMG717

## 2017-12-03 MED ORDER — LIDOCAINE HCL 1 % IJ SOLN
INTRAMUSCULAR | Status: AC
  Filled 2017-12-03: qty 20

## 2017-12-03 MED ORDER — IOPAMIDOL (ISOVUE-300) INJECTION 61%
INTRAVENOUS | Status: AC
  Administered 2017-12-03: 15 mL
  Filled 2017-12-03: qty 50

## 2017-12-03 NOTE — Procedures (Signed)
Interventional Radiology Procedure Note  History: 83 yo male with diverticulitis and abscess 11/17/2014.  Drainage performed, and is not surgical candidate for operation to resolve a subsequent fistula/chronic abscess.  Undergoing chronic tube management. Presents for exchange.   Procedure: Exchange of abscess drain for diverticular abscess and now fistula.  54F drain exchange. Gravity.  .  Complications: None  Recommendations:  - To gravity - routine exchange in 3 months - Do not submerge   - Routine care   Signed,  Dulcy Fanny. Earleen Newport, DO

## 2017-12-09 ENCOUNTER — Ambulatory Visit (HOSPITAL_COMMUNITY): Payer: Medicare Other

## 2017-12-10 ENCOUNTER — Ambulatory Visit (HOSPITAL_COMMUNITY): Payer: Medicare Other

## 2017-12-22 ENCOUNTER — Other Ambulatory Visit: Payer: Self-pay

## 2017-12-22 ENCOUNTER — Emergency Department (HOSPITAL_COMMUNITY)
Admission: EM | Admit: 2017-12-22 | Discharge: 2017-12-22 | Disposition: A | Discharge status: Home / Self Care | Payer: Medicare Other | Attending: Emergency Medicine | Admitting: Emergency Medicine

## 2017-12-22 ENCOUNTER — Encounter (HOSPITAL_COMMUNITY): Payer: Self-pay

## 2017-12-22 DIAGNOSIS — T8132XA Disruption of internal operation (surgical) wound, not elsewhere classified, initial encounter: Secondary | ICD-10-CM

## 2017-12-22 DIAGNOSIS — Z79899 Other long term (current) drug therapy: Secondary | ICD-10-CM | POA: Diagnosis not present

## 2017-12-22 DIAGNOSIS — F0390 Unspecified dementia without behavioral disturbance: Secondary | ICD-10-CM | POA: Diagnosis not present

## 2017-12-22 DIAGNOSIS — I129 Hypertensive chronic kidney disease with stage 1 through stage 4 chronic kidney disease, or unspecified chronic kidney disease: Secondary | ICD-10-CM | POA: Diagnosis not present

## 2017-12-22 DIAGNOSIS — Z7982 Long term (current) use of aspirin: Secondary | ICD-10-CM | POA: Diagnosis not present

## 2017-12-22 DIAGNOSIS — Z7902 Long term (current) use of antithrombotics/antiplatelets: Secondary | ICD-10-CM | POA: Insufficient documentation

## 2017-12-22 DIAGNOSIS — T85518A Breakdown (mechanical) of other gastrointestinal prosthetic devices, implants and grafts, initial encounter: Secondary | ICD-10-CM | POA: Diagnosis not present

## 2017-12-22 DIAGNOSIS — N184 Chronic kidney disease, stage 4 (severe): Secondary | ICD-10-CM | POA: Diagnosis not present

## 2017-12-22 DIAGNOSIS — E039 Hypothyroidism, unspecified: Secondary | ICD-10-CM | POA: Insufficient documentation

## 2017-12-22 DIAGNOSIS — Y829 Unspecified medical devices associated with adverse incidents: Secondary | ICD-10-CM | POA: Diagnosis not present

## 2017-12-22 DIAGNOSIS — E785 Hyperlipidemia, unspecified: Secondary | ICD-10-CM | POA: Insufficient documentation

## 2017-12-22 DIAGNOSIS — Z8673 Personal history of transient ischemic attack (TIA), and cerebral infarction without residual deficits: Secondary | ICD-10-CM | POA: Diagnosis not present

## 2017-12-22 DIAGNOSIS — Z87891 Personal history of nicotine dependence: Secondary | ICD-10-CM | POA: Diagnosis not present

## 2017-12-22 NOTE — ED Triage Notes (Signed)
Pt arrived from home with daughter. Daughter reports pt abd tube came out. Left lower quadrant.

## 2017-12-22 NOTE — Discharge Instructions (Signed)
The interventional radiology team will call you with a time to come to for drain placement tomorrow

## 2017-12-22 NOTE — ED Provider Notes (Signed)
[facility] EMERGENCY DEPARTMENT Provider Note   CSN: [id number] Arrival date & time: 12/22/17  1016   Level 5 caveat dementia history is obtained from patient's daughters but he him  History   Chief Complaint Chief Complaint  Patient presents with  . Tube placement    HPI DARROL BRANDENBURG is a 83 y.o. male.  Patient's percutaneous drain from abdomen came out sometime within the past 4 days.  They do not know the exact point.  They went to change the dressing this morning and noted that the tube was out.  Patient denies pain anywhere.  HPI  Past Medical History:  Diagnosis Date  . Arthritis   . BPH (benign prostatic hyperplasia)   . Carotid artery occlusion   . Dementia   . Dysphagia   . Hyperlipidemia   . Hypertension   . Peripheral vascular disease ([place])   . Stroke [facility])    Right hemispheric CVA  . Umbilical hernia     Patient Active Problem List   Diagnosis Date Noted  . 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 CKD (chronic kidney disease) stage 4, GFR 15-29 ml/min (HCC) 09/03/2017  . AAA (abdominal aortic aneurysm) ([place]) 09/03/2017  . Abdominal wall abscess 09/02/2017  . Essential hypertension 07/30/2017  . Acute idiopathic gout of right foot 07/30/2017  . Abdominal aortic atherosclerosis ([place]) 08/19/2016  . Bilateral low back pain without sciatica 08/18/2016  . Hyperglycemia 07/23/2016  . Hypothyroidism 07/23/2016  . Moderate dementia without behavioral disturbance 07/23/2016  . Abdominal fistula 11/22/2014  . Hyperlipidemia 11/17/2014  . Dysphagia   . Stroke ([place])   . BPH (benign prostatic hyperplasia)     Past Surgical History:  Procedure Laterality Date  . CAROTID ENDARTERECTOMY  10/06/2007   Right CEA by Dr. Amedeo Plenty  . IR CATHETER TUBE CHANGE  09/15/2017  . IR CATHETER TUBE CHANGE  12/03/2017  . IR GENERIC HISTORICAL  07/09/2016   IR RADIOLOGIST EVAL & MGMT 07/09/2016 Arne Cleveland, MD [facility] INTERV RAD  . IR RADIOLOGIST EVAL & MGMT   09/15/2017  . PEG TUBE PLACEMENT  2008   after CVA       Home Medications    Prior to Admission medications   Medication Sig Start Date End Date Taking? Authorizing Provider  aspirin 81 MG tablet Take 81 mg by mouth daily.   Yes [provider]  benazepril (LOTENSIN) 40 MG tablet TAKE 1 TABLET BY MOUTH EVERY DAY 09/24/17  Yes Janith Lima, MD  clopidogrel (PLAVIX) 75 MG tablet TAKE 1 TABLET (75 MG TOTAL) BY MOUTH DAILY. 09/12/17  Yes Janith Lima, MD  Colchicine 0.6 MG CAPS TAKE 1 CAPSULE BY MOUTH TWICE A DAY 11/30/17  Yes Janith Lima, MD  donepezil (ARICEPT) 10 MG tablet Take 1 tablet (10 mg total) by mouth at bedtime. 04/29/17  Yes Cameron Sprang, MD  levothyroxine (SYNTHROID, LEVOTHROID) 50 MCG tablet TAKE 1 TABLET (50 MCG TOTAL) BY MOUTH DAILY. 11/23/17  Yes Janith Lima, MD  simvastatin (ZOCOR) 20 MG tablet Take 20 mg by mouth daily.   Yes [provider]    Family History No family history on file.  Social History Social History   Tobacco Use  . Smoking status: Former Research scientist (life sciences)  . Smokeless tobacco: Never Used  . Tobacco comment: remote h/o  Substance Use Topics  . Alcohol use: No  . Drug use: No     Allergies   Patient has no known allergies.   Review  of Systems Review of Systems  Unable to perform ROS: Dementia     Physical Exam Updated Vital Signs BP (!) 106/57   Pulse (!) 45   Temp (!) 97.5 F (36.4 C) (Oral)   Resp 18   Ht 5\' 11"  (1.803 m)   Wt 97.5 kg (215 lb)   SpO2 100%   BMI 29.99 kg/m   Physical Exam  Constitutional: No distress.  Chronically ill-appearing  HENT:  Head: Normocephalic and atraumatic.  Eyes: Conjunctivae are normal. Pupils are equal, round, and reactive to light.  Neck: Neck supple. No tracheal deviation present. No thyromegaly present.  Cardiovascular: Normal rate and regular rhythm.  No murmur heard. Pulmonary/Chest: Effort normal and breath sounds normal.  Abdominal: Soft. Bowel sounds are  normal. He exhibits no distension. There is no tenderness.  Ostomy site at left lower quadrant draining slight amount of beige material.  No tenderness.  Musculoskeletal: Normal range of motion. He exhibits no edema or tenderness.  Neurological: He is alert. Coordination normal.  Skin: Skin is warm and dry. No rash noted.  Psychiatric: He has a normal mood and affect.  Nursing note and vitals reviewed.    ED Treatments / Results  Labs (all labs ordered are listed, but only abnormal results are displayed) Labs Reviewed - No data to display  EKG  EKG Interpretation None       Radiology No results found.  Procedures Procedures (including critical care time)  Medications Ordered in ED Medications - No data to display   Initial Impression / Assessment and Plan / ED Course  I have reviewed the triage vital signs and the nursing notes.  Pertinent labs & imaging results that were available during my care of the patient were reviewed by me and considered in my medical decision making (see chart for details).     I spoke with interventional radiologist Dr. Laurence Ferrari who arranged for percutaneous tube to be replaced tomorrow  Final Clinical Impressions(s) / ED Diagnoses  Diagnosis disruption of percutaneous drain Final diagnoses:  None    ED Discharge Orders    None       Orlie Dakin, MD 12/22/17 1422

## 2017-12-23 ENCOUNTER — Other Ambulatory Visit (HOSPITAL_COMMUNITY): Payer: Self-pay | Admitting: Interventional Radiology

## 2017-12-23 ENCOUNTER — Ambulatory Visit (HOSPITAL_COMMUNITY)
Admission: RE | Admit: 2017-12-23 | Discharge: 2017-12-23 | Disposition: A | Discharge status: Home / Self Care | Payer: Medicare Other | Source: Ambulatory Visit | Attending: Interventional Radiology | Admitting: Interventional Radiology

## 2017-12-23 ENCOUNTER — Encounter (HOSPITAL_COMMUNITY): Payer: Self-pay | Admitting: Interventional Radiology

## 2017-12-23 DIAGNOSIS — Z4803 Encounter for change or removal of drains: Secondary | ICD-10-CM | POA: Insufficient documentation

## 2017-12-23 DIAGNOSIS — K750 Abscess of liver: Secondary | ICD-10-CM

## 2017-12-23 DIAGNOSIS — N3289 Other specified disorders of bladder: Secondary | ICD-10-CM | POA: Diagnosis not present

## 2017-12-23 HISTORY — PX: IR CATHETER TUBE CHANGE: IMG717

## 2017-12-23 MED ORDER — LIDOCAINE HCL (PF) 1 % IJ SOLN
INTRAMUSCULAR | Status: DC | PRN
  Administered 2017-12-23: 10 mL

## 2017-12-23 MED ORDER — IOPAMIDOL (ISOVUE-300) INJECTION 61%
INTRAVENOUS | Status: AC
  Administered 2017-12-23: 15 mL
  Filled 2017-12-23: qty 50

## 2017-12-23 MED ORDER — LIDOCAINE VISCOUS 2 % MT SOLN
OROMUCOSAL | Status: AC
  Administered 2017-12-23: 11:00:00
  Filled 2017-12-23: qty 15

## 2017-12-23 MED ORDER — LIDOCAINE HCL 1 % IJ SOLN
INTRAMUSCULAR | Status: AC
  Filled 2017-12-23: qty 20

## 2017-12-24 ENCOUNTER — Other Ambulatory Visit: Payer: Medicare Other

## 2017-12-24 ENCOUNTER — Encounter: Payer: Self-pay | Admitting: Internal Medicine

## 2017-12-24 ENCOUNTER — Ambulatory Visit (INDEPENDENT_AMBULATORY_CARE_PROVIDER_SITE_OTHER)
Admission: RE | Admit: 2017-12-24 | Discharge: 2017-12-24 | Disposition: A | Discharge status: Home / Self Care | Payer: Medicare Other | Source: Ambulatory Visit | Attending: Internal Medicine | Admitting: Internal Medicine

## 2017-12-24 ENCOUNTER — Ambulatory Visit (INDEPENDENT_AMBULATORY_CARE_PROVIDER_SITE_OTHER): Payer: Medicare Other | Admitting: Internal Medicine

## 2017-12-24 VITALS — BP 98/56 | HR 73 | Temp 99.0°F | Ht 71.0 in | Wt 217.0 lb

## 2017-12-24 DIAGNOSIS — R109 Unspecified abdominal pain: Secondary | ICD-10-CM

## 2017-12-24 DIAGNOSIS — R300 Dysuria: Secondary | ICD-10-CM

## 2017-12-24 NOTE — Progress Notes (Signed)
Subjective:  Patient ID: Robert Barnett, male    DOB: 09/26/1932  Age: 83 y.o. MRN: [id number]  CC: Flank Pain   HPI Robert Barnett presents for concerns about a 2-week history of left-sided flank pain.  His daughter is with him and says his urine also smells foul.  She has not noticed any nausea, vomiting, fever, or chills.  Outpatient Medications Prior to Visit  Medication Sig Dispense Refill  . aspirin 81 MG tablet Take 81 mg by mouth daily.    . benazepril (LOTENSIN) 40 MG tablet TAKE 1 TABLET BY MOUTH EVERY DAY 90 tablet 1  . clopidogrel (PLAVIX) 75 MG tablet TAKE 1 TABLET (75 MG TOTAL) BY MOUTH DAILY. 90 tablet 1  . Colchicine 0.6 MG CAPS TAKE 1 CAPSULE BY MOUTH TWICE A DAY 60 capsule 3  . donepezil (ARICEPT) 10 MG tablet Take 1 tablet (10 mg total) by mouth at bedtime. 90 tablet 3  . levothyroxine (SYNTHROID, LEVOTHROID) 50 MCG tablet TAKE 1 TABLET (50 MCG TOTAL) BY MOUTH DAILY. 90 tablet 0  . simvastatin (ZOCOR) 20 MG tablet Take 20 mg by mouth daily.     No facility-administered medications prior to visit.     ROS Review of Systems  Constitutional: Negative for appetite change, diaphoresis, fatigue, fever and unexpected weight change.  HENT: Negative.   Eyes: Negative.   Respiratory: Negative.  Negative for cough, chest tightness, shortness of breath and wheezing.   Cardiovascular: Negative.  Negative for chest pain, palpitations and leg swelling.  Gastrointestinal: Negative for abdominal pain, diarrhea and nausea.  Endocrine: Negative.   Genitourinary: Positive for flank pain. Negative for difficulty urinating, dysuria, frequency and hematuria.  Musculoskeletal: Negative for back pain and neck pain.  Skin: Negative.  Negative for color change and rash.  Allergic/Immunologic: Negative.   Neurological: Negative.  Negative for dizziness and weakness.  Hematological: Negative for adenopathy. Does not bruise/bleed easily.  Psychiatric/Behavioral: Negative.     Objective:   BP (!) 98/56 (BP Location: Left Arm, Patient Position: Sitting, Cuff Size: Normal)   Pulse 73   Temp 99 F (37.2 C) (Oral)   Ht 5\' 11"  (1.803 m)   Wt 217 lb (98.4 kg)   SpO2 98%   BMI 30.27 kg/m   BP Readings from Last 3 Encounters:  12/24/17 (!) 98/56  12/22/17 128/61  09/30/17 (!) 121/57    Wt Readings from Last 3 Encounters:  12/24/17 217 lb (98.4 kg)  12/22/17 215 lb (97.5 kg)  09/29/17 217 lb (98.4 kg)    Physical Exam  Constitutional: He is oriented to person, place, and time. No distress.  HENT:  Mouth/Throat: No oropharyngeal exudate.  Eyes: Conjunctivae are normal. Left eye exhibits no discharge. No scleral icterus.  Neck: Normal range of motion. Neck supple. No JVD present. No thyromegaly present.  Cardiovascular: Normal rate, regular rhythm and normal heart sounds.  No murmur heard. Pulmonary/Chest: Effort normal and breath sounds normal. He has no wheezes. He has no rales.  Abdominal: Soft. Normal appearance and bowel sounds are normal. He exhibits no distension and no mass. There is no hepatosplenomegaly, splenomegaly or hepatomegaly. There is no tenderness. There is no rebound, no guarding and no CVA tenderness.  Musculoskeletal: Normal range of motion. He exhibits no edema or tenderness.  Lymphadenopathy:    He has no cervical adenopathy.  Neurological: He is alert and oriented to person, place, and time.  Skin: Skin is warm and dry. No rash noted. He is not diaphoretic.  No erythema. No pallor.  Vitals reviewed.   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 Catheter was inadvertently removed. EXAM: REPLACEMENT OF PERCUTANEOUS ABSCESS DRAIN CATHETER UNDER FLUOROSCOPY MEDICATIONS: None required ANESTHESIA/SEDATION: None required COMPLICATIONS: None immediate. PROCEDURE: Informed written consent was obtained from the family after a thorough discussion of the procedural risks, benefits and alternatives. All questions were addressed. Maximal Sterile Barrier Technique was utilized including caps, mask, sterile gowns, sterile gloves, sterile drape, hand hygiene and skin antiseptic. A timeout was performed prior to the initiation of the procedure. Under fluoroscopy, a 5 [place] Kumpe catheter was advanced through the tract of the previously placed catheter using a small amount of contrast for guidance. The chronic abscess cavity was identified and entered. Fistula to the mid sigmoid colon was again demonstrated with contrast injection under fluoroscopy. The catheter was exchanged over an 035 guidewire for a 14 French pigtail catheter, formed centrally within the collection. Catheter secured externally with 0 Prolene suture and StatLock and placed to gravity drain bag. The patient tolerated the procedure well. IMPRESSION: 1. Technically successful replacement of 14 French pelvic abscess drain catheter. Electronically Signed   By: Lucrezia Europe M.D.   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 Catheter was inadvertently removed. EXAM: REPLACEMENT OF PERCUTANEOUS ABSCESS DRAIN CATHETER UNDER FLUOROSCOPY MEDICATIONS: None required ANESTHESIA/SEDATION: None required COMPLICATIONS: None immediate. PROCEDURE: Informed written consent was  obtained from the family after a thorough discussion of the procedural risks, benefits and alternatives. All questions were addressed. Maximal Sterile Barrier Technique was utilized including caps, mask, sterile gowns, sterile gloves, sterile drape, hand hygiene and skin antiseptic. A timeout was performed prior to the initiation of the procedure. Under fluoroscopy, a 5 [place] Kumpe catheter was advanced through the tract of the previously placed catheter using a small amount of contrast for guidance. The chronic abscess cavity was identified and entered. Fistula to the mid sigmoid colon was again demonstrated with contrast injection under fluoroscopy. The catheter was exchanged over an 035 guidewire for a 14 French pigtail catheter, formed centrally within the collection. Catheter secured externally with 0 Prolene suture and StatLock and placed to gravity drain bag. The patient tolerated the procedure well. IMPRESSION: 1. Technically successful replacement of 14 French pelvic abscess drain catheter. Electronically Signed   By: Lucrezia Europe M.D.   On: 12/23/2017 14:36   Ir Catheter Tube Change  Result Date: 12/03/2017 INDICATION: 83 year old male with a history of diverticular abscess, occurring November 2015. 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 The patient has been deemed nonsurgical candidate, and is undergoing chronic percutaneous management with routine  drain exchange every 3 months. EXAM: IR CATHETER TUBE CHANGE MEDICATIONS: None ANESTHESIA/SEDATION: None COMPLICATIONS: None PROCEDURE: Informed written consent was obtained from the patient after a thorough discussion of the procedural risks, benefits and alternatives. All questions were addressed. Maximal Sterile Barrier Technique was utilized including caps, mask, sterile gowns, sterile gloves, sterile drape, hand hygiene and skin antiseptic. A timeout was performed prior to the initiation of the procedure.  Patient positioned supine position on the fluoroscopy table. Scout image acquired. Contrast was infused through the drain, confirming drain position in the abdomen. Fistula 0 was confirmed to persist. Modified Seldinger technique was used to place a 12 [place] drain. Drain was attached to gravity. Patient tolerated the procedure well and remained hemodynamically stable throughout. No complications were encountered and no significant blood loss. 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 Signed, Dulcy Fanny. Earleen Newport, DO [facility] Electronically Signed   By: Corrie Mckusick D.O.   On: 12/03/2017 09:47   Dg Abd Acute W/chest  Result Date: 12/24/2017 CLINICAL DATA:  83 year old male with a history of left flank pain EXAM: DG ABDOMEN ACUTE W/ 1V CHEST COMPARISON:  CT 09/30/2017 FINDINGS: Chest: Cardiomediastinal silhouette within normal limits. No evidence of central vascular congestion. No interlobular septal thickening. No pleural effusion. No confluent airspace disease. Abdomen: Gas within stomach, small bowel, colon.  No abnormal distention. Pigtail drainage catheter project over the anatomic pelvis. No radiopaque foreign body.  No unexpected soft tissue density. IMPRESSION: Chest: No radiographic evidence of acute cardiopulmonary disease. Abdomen: Nonobstructive bowel gas pattern. Pigtail drain projects over the anatomic pelvis. Electronically Signed   By: Corrie Mckusick D.O.   On: 12/24/2017 10:21    Assessment & Plan:   Jakye was seen today for flank pain.  Diagnoses and all orders for this visit:  Acute left flank pain- He was not able to yield leave a urine specimen today to assess for infection.  His daughter tells me that she will bring him back to submit a urine specimen.  His exam and plain films are unremarkable with the exception of the  pigtail drain in his pelvis. She will let me know if he develops any new or worsening symptoms. -     Urinalysis, Routine w reflex microscopic; Future -     DG Abd Acute W/Chest; Future  Dysuria- As above -     Urinalysis, Routine w reflex microscopic; Future -     CULTURE, URINE COMPREHENSIVE; Future   I am having Matilde Bash maintain his aspirin, simvastatin, donepezil, clopidogrel, benazepril, levothyroxine, and Colchicine.  No orders of the defined types were placed in this encounter.    Follow-up: Return in about 1 week (around 12/31/2017).  Scarlette Calico, MD

## 2017-12-24 NOTE — Patient Instructions (Signed)
Flank Pain, Adult Flank pain is pain that is located on the side of the body between the upper abdomen and the back. This area is called the flank. The pain may occur over a short period of time (acute), or it may be long-term or recurring (chronic). It may be mild or severe. Flank pain can be caused by many things, including:  Muscle soreness or injury.  Kidney stones or kidney disease.  Stress.  A disease of the spine (vertebral disk disease).  A lung infection (pneumonia).  Fluid around the lungs (pulmonary edema).  A skin rash caused by the chickenpox virus (shingles).  Tumors that affect the back of the abdomen.  Gallbladder disease.  Follow these instructions at home:  Drink enough fluid to keep your urine clear or pale yellow.  Rest as told by your health care provider.  Take over-the-counter and prescription medicines only as told by your health care provider.  Keep a journal to track what has caused your flank pain and what has made it feel better.  Keep all follow-up visits as told by your health care provider. This is important. Contact a health care provider if:  Your pain is not controlled with medicine.  You have new symptoms.  Your pain gets worse.  You have a fever.  Your symptoms last longer than 2-3 days.  You have trouble urinating or you are urinating very frequently. Get help right away if:  You have trouble breathing or you are short of breath.  Your abdomen hurts or it is swollen or red.  You have nausea or vomiting.  You feel faint or you pass out.  You have blood in your urine. Summary  Flank pain is pain that is located on the side of the body between the upper abdomen and the back.  The pain may occur over a short period of time (acute), or it may be long-term or recurring (chronic). It may be mild or severe.  Flank pain can be caused by many things.  Contact your health care provider if your symptoms get worse or they last  longer than 2-3 days. This information is not intended to replace advice given to you by your health care provider. Make sure you discuss any questions you have with your health care provider. Document Released: 02/05/2006 Document Revised: 02/27/2017 Document Reviewed: 02/27/2017 Elsevier Interactive Patient Education  2018 [employer]  

## 2017-12-30 ENCOUNTER — Other Ambulatory Visit (INDEPENDENT_AMBULATORY_CARE_PROVIDER_SITE_OTHER): Payer: Medicare Other

## 2017-12-30 DIAGNOSIS — R300 Dysuria: Secondary | ICD-10-CM | POA: Diagnosis not present

## 2017-12-30 LAB — URINALYSIS, ROUTINE W REFLEX MICROSCOPIC
BILIRUBIN URINE: NEGATIVE
KETONES UR: NEGATIVE
Leukocytes, UA: NEGATIVE
Nitrite: NEGATIVE
PH: 6 (ref 5.0–8.0)
Specific Gravity, Urine: 1.015 (ref 1.000–1.030)
TOTAL PROTEIN, URINE-UPE24: NEGATIVE
Urine Glucose: NEGATIVE
Urobilinogen, UA: 0.2 (ref 0.0–1.0)

## 2017-12-31 ENCOUNTER — Encounter: Payer: Self-pay | Admitting: Internal Medicine

## 2017-12-31 LAB — URINE CULTURE
MICRO NUMBER: 90003292
Result:: NO GROWTH
SPECIMEN QUALITY: ADEQUATE

## 2018-03-05 ENCOUNTER — Other Ambulatory Visit: Payer: Self-pay | Admitting: Internal Medicine

## 2018-03-05 DIAGNOSIS — E039 Hypothyroidism, unspecified: Secondary | ICD-10-CM

## 2018-03-10 ENCOUNTER — Other Ambulatory Visit: Payer: Self-pay | Admitting: Internal Medicine

## 2018-03-10 DIAGNOSIS — I6300 Cerebral infarction due to thrombosis of unspecified precerebral artery: Secondary | ICD-10-CM

## 2018-03-30 ENCOUNTER — Other Ambulatory Visit: Payer: Self-pay | Admitting: Internal Medicine

## 2018-03-30 DIAGNOSIS — M10071 Idiopathic gout, right ankle and foot: Secondary | ICD-10-CM

## 2018-03-31 ENCOUNTER — Encounter: Payer: Self-pay | Admitting: Internal Medicine

## 2018-03-31 ENCOUNTER — Ambulatory Visit: Payer: Medicare Other | Admitting: Internal Medicine

## 2018-03-31 VITALS — BP 118/70 | HR 52 | Temp 98.2°F | Resp 16 | Ht 71.0 in | Wt 218.0 lb

## 2018-03-31 DIAGNOSIS — L309 Dermatitis, unspecified: Secondary | ICD-10-CM | POA: Diagnosis not present

## 2018-03-31 DIAGNOSIS — Z23 Encounter for immunization: Secondary | ICD-10-CM | POA: Diagnosis not present

## 2018-03-31 MED ORDER — TRIAMCINOLONE ACETONIDE 0.5 % EX CREA: 1.0000 "application " | TOPICAL_CREAM | Freq: Two times a day (BID) | CUTANEOUS | 1 refills | Status: AC

## 2018-03-31 NOTE — Patient Instructions (Signed)

## 2018-03-31 NOTE — Progress Notes (Signed)
Subjective:  Patient ID: Robert Barnett, male    DOB: [birth year]  Age: 84 y.o. MRN: [id number]  CC: Rash   HPI Robert Barnett presents for concerns about a chronic, recurrent itchy rash on his upper and mid back.  He has a history of having a large area of psoriasis on his lower back but it does not bother him.  He has not been treating this rash recently.  Outpatient Medications Prior to Visit  Medication Sig Dispense Refill  . aspirin 81 MG tablet Take 81 mg by mouth daily.    . benazepril (LOTENSIN) 40 MG tablet TAKE 1 TABLET BY MOUTH EVERY DAY 90 tablet 0  . clopidogrel (PLAVIX) 75 MG tablet TAKE 1 TABLET (75 MG TOTAL) BY MOUTH DAILY. 90 tablet 0  . Colchicine 0.6 MG CAPS TAKE 1 CAPSULE BY MOUTH TWICE A DAY 60 capsule 3  . donepezil (ARICEPT) 10 MG tablet Take 1 tablet (10 mg total) by mouth at bedtime. 90 tablet 3  . levothyroxine (SYNTHROID, LEVOTHROID) 50 MCG tablet TAKE 1 TABLET (50 MCG TOTAL) BY MOUTH DAILY. 90 tablet 0  . simvastatin (ZOCOR) 20 MG tablet Take 20 mg by mouth daily.     No facility-administered medications prior to visit.     ROS Review of Systems  Constitutional: Negative for appetite change, chills, fatigue and fever.  HENT: Negative.   Eyes: Negative for visual disturbance.  Respiratory: Negative for cough, chest tightness, shortness of breath and wheezing.   Cardiovascular: Negative for chest pain, palpitations and leg swelling.  Gastrointestinal: Negative for abdominal pain, diarrhea and nausea.  Endocrine: Negative.   Genitourinary: Negative.   Musculoskeletal: Negative.  Negative for back pain and myalgias.  Skin: Positive for rash. Negative for color change.  Allergic/Immunologic: Negative.   Neurological: Negative.   Hematological: Negative for adenopathy. Does not bruise/bleed easily.  Psychiatric/Behavioral: Negative.     Objective:  BP 118/70 (BP Location: Left Arm, Patient Position: Sitting, Cuff Size: Large)   Pulse (!) 52   Temp 98.2  F (36.8 C) (Oral)   Resp 16   Ht 5\' 11"  (1.803 m)   Wt 218 lb (98.9 kg)   SpO2 96%   BMI 30.40 kg/m   BP Readings from Last 3 Encounters:  03/31/18 118/70  12/24/17 (!) 98/56  12/22/17 128/61    Wt Readings from Last 3 Encounters:  03/31/18 218 lb (98.9 kg)  12/24/17 217 lb (98.4 kg)  12/22/17 215 lb (97.5 kg)    Physical Exam  Constitutional: He is oriented to person, place, and time. No distress.  HENT:  Mouth/Throat: Oropharynx is clear and moist. No oropharyngeal exudate.  Eyes: Conjunctivae are normal. Left eye exhibits no discharge. No scleral icterus.  Neck: Normal range of motion. Neck supple. No JVD present. No thyromegaly present.  Cardiovascular: Normal rate, regular rhythm and normal heart sounds. Exam reveals no gallop.  No murmur heard. Pulmonary/Chest: Effort normal and breath sounds normal. No respiratory distress. He has no wheezes. He has no rales.  Abdominal: Soft. Bowel sounds are normal. He exhibits no distension and no mass. There is no tenderness. There is no guarding.  Musculoskeletal: Normal range of motion. He exhibits no edema or tenderness.       Back:  Lymphadenopathy:    He has no cervical adenopathy.  Neurological: He is alert and oriented to person, place, and time.  Skin: Skin is warm and dry. Rash noted. He is not diaphoretic. No erythema. No pallor.  Vitals reviewed.   Lab Results  Component Value Date   WBC 8.0 09/29/2017   HGB 12.4 (L) 09/29/2017   HCT 39.2 09/29/2017   PLT 145 (L) 09/29/2017   GLUCOSE 91 09/29/2017   CHOL 113 07/30/2017   TRIG 95.0 07/30/2017   HDL 30.60 (L) 07/30/2017   LDLCALC 63 07/30/2017   ALT 14 (L) 09/29/2017   AST 22 09/29/2017   NA 140 09/29/2017   K 4.7 09/29/2017   CL 108 09/29/2017   CREATININE 2.18 (H) 09/29/2017   BUN 22 (H) 09/29/2017   CO2 25 09/29/2017   TSH 3.07 07/30/2017   INR 1.00 09/15/2017   HGBA1C 5.6 07/23/2016    Dg Abd Acute W/chest  Result Date: 12/24/2017 CLINICAL  DATA:  84 year old male with a history of left flank pain EXAM: DG ABDOMEN ACUTE W/ 1V CHEST COMPARISON:  CT 09/30/2017 FINDINGS: Chest: Cardiomediastinal silhouette within normal limits. No evidence of central vascular congestion. No interlobular septal thickening. No pleural effusion. No confluent airspace disease. Abdomen: Gas within stomach, small bowel, colon.  No abnormal distention. Pigtail drainage catheter project over the anatomic pelvis. No radiopaque foreign body.  No unexpected soft tissue density. IMPRESSION: Chest: No radiographic evidence of acute cardiopulmonary disease. Abdomen: Nonobstructive bowel gas pattern. Pigtail drain projects over the anatomic pelvis. Electronically Signed   By: Corrie Mckusick D.O.   On: 12/24/2017 10:21    Assessment & Plan:   Bandon was seen today for rash.  Diagnoses and all orders for this visit:  Eczema, unspecified type- I will treat this with a low potency, topical steroid.  I have asked his daughter to apply this and to moisturize his skin twice a day. -     triamcinolone cream (KENALOG) 0.5 %; Apply 1 application topically 2 (two) times daily.  Need for pneumococcal vaccination -     Pneumococcal conjugate vaccine 13-valent   I am having Robert Barnett start on triamcinolone cream. I am also having him maintain his aspirin, simvastatin, donepezil, levothyroxine, benazepril, clopidogrel, and Colchicine.  Meds ordered this encounter  Medications  . triamcinolone cream (KENALOG) 0.5 %    Sig: Apply 1 application topically 2 (two) times daily.    Dispense:  454 g    Refill:  1     Follow-up: Return if symptoms worsen or fail to improve.  Scarlette Calico, MD

## 2018-05-04 ENCOUNTER — Other Ambulatory Visit: Payer: Self-pay | Admitting: General Surgery

## 2018-05-04 DIAGNOSIS — K750 Abscess of liver: Secondary | ICD-10-CM

## 2018-05-05 ENCOUNTER — Ambulatory Visit: Payer: Medicare Other | Admitting: Neurology

## 2018-05-05 ENCOUNTER — Encounter: Payer: Self-pay | Admitting: Neurology

## 2018-05-05 ENCOUNTER — Other Ambulatory Visit: Payer: Self-pay

## 2018-05-05 VITALS — BP 110/64 | HR 65 | Wt 213.0 lb

## 2018-05-05 DIAGNOSIS — F0390 Unspecified dementia without behavioral disturbance: Secondary | ICD-10-CM | POA: Diagnosis not present

## 2018-05-05 DIAGNOSIS — F03B0 Unspecified dementia, moderate, without behavioral disturbance, psychotic disturbance, mood disturbance, and anxiety: Secondary | ICD-10-CM

## 2018-05-05 MED ORDER — MEMANTINE HCL 10 MG PO TABS: 10.0000 mg | ORAL_TABLET | Freq: Two times a day (BID) | ORAL | 3 refills | Status: DC

## 2018-05-05 MED ORDER — DONEPEZIL HCL 10 MG PO TABS: 10.0000 mg | ORAL_TABLET | Freq: Every day | ORAL | 3 refills | Status: DC

## 2018-05-05 NOTE — Patient Instructions (Signed)
1. Start Namenda 10mg  twice a day 2. Continue Donepezil 10mg  daily 3. Continue 24/7 supervision 4. Follow-up in 1 year, call for any changes  FALL PRECAUTIONS: Be cautious when walking. Scan the area for obstacles that may increase the risk of trips and falls. When getting up in the mornings, sit up at the edge of the bed for a few minutes before getting out of bed. Consider elevating the bed at the head end to avoid drop of blood pressure when getting up. Walk always in a well-lit room (use night lights in the walls). Avoid area rugs or power cords from appliances in the middle of the walkways. Use a walker or a cane if necessary and consider physical therapy for balance exercise. Get your eyesight checked regularly.  FINANCIAL OVERSIGHT: Supervision, especially oversight when making financial decisions or transactions is also recommended.  HOME SAFETY: Consider the safety of the kitchen when operating appliances like stoves, microwave oven, and blender. Consider having supervision and share cooking responsibilities until no longer able to participate in those. Accidents with firearms and other hazards in the house should be identified and addressed as well.  DRIVING: Regarding driving, in patients with progressive memory problems, driving will be impaired. We advise to have someone else do the driving if trouble finding directions or if minor accidents are reported. Independent driving assessment is available to determine safety of driving.  ABILITY TO BE LEFT ALONE: If patient is unable to contact 911 operator, consider using LifeLine, or when the need is there, arrange for someone to stay with patients. Smoking is a fire hazard, consider supervision or cessation. Risk of wandering should be assessed by caregiver and if detected at any point, supervision and safe proof recommendations should be instituted.  MEDICATION SUPERVISION: Inability to self-administer medication needs to be constantly  addressed. Implement a mechanism to ensure safe administration of the medications.  RECOMMENDATIONS FOR ALL PATIENTS WITH MEMORY PROBLEMS: 1. Continue to exercise (Recommend 30 minutes of walking everyday, or 3 hours every week) 2. Increase social interactions - continue going to [employer] and enjoy social gatherings with friends and family 3. Eat healthy, avoid fried foods and eat more fruits and vegetables 4. Maintain adequate blood pressure, blood sugar, and blood cholesterol level. Reducing the risk of stroke and cardiovascular disease also helps promoting better memory. 5. Avoid stressful situations. Live a simple life and avoid aggravations. Organize your time and prepare for the next day in anticipation. 6. Sleep well, avoid any interruptions of sleep and avoid any distractions in the bedroom that may interfere with adequate sleep quality 7. Avoid sugar, avoid sweets as there is a strong link between excessive sugar intake, diabetes, and cognitive impairment The Mediterranean diet has been shown to help patients reduce the risk of progressive memory disorders and reduces cardiovascular risk. This includes eating fish, eat fruits and green leafy vegetables, nuts like almonds and hazelnuts, walnuts, and also use olive oil. Avoid fast foods and fried foods as much as possible. Avoid sweets and sugar as sugar use has been linked to worsening of memory function.  There is always a concern of gradual progression of memory problems. If this is the case, then we may need to adjust level of care according to patient needs. Support, both to the patient and caregiver, should then be put into place.
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
## 2018-05-06 ENCOUNTER — Ambulatory Visit
Admission: RE | Admit: 2018-05-06 | Discharge: 2018-05-06 | Disposition: A | Discharge status: Home / Self Care | Payer: Medicare Other | Source: Ambulatory Visit | Attending: General Surgery | Admitting: General Surgery

## 2018-05-06 ENCOUNTER — Other Ambulatory Visit (HOSPITAL_COMMUNITY): Payer: Self-pay | Admitting: Interventional Radiology

## 2018-05-06 ENCOUNTER — Encounter: Payer: Self-pay | Admitting: Radiology

## 2018-05-06 ENCOUNTER — Ambulatory Visit: Admission: RE | Admit: 2018-05-06 | Payer: Medicare Other | Source: Ambulatory Visit

## 2018-05-06 DIAGNOSIS — K750 Abscess of liver: Secondary | ICD-10-CM

## 2018-05-06 HISTORY — PX: IR RADIOLOGIST EVAL & MGMT: IMG5224

## 2018-05-06 NOTE — Progress Notes (Signed)
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   Patient presents to clinic today with his daughter who states that the drain likely came out on Tuesday. She states that over the past 2 days output has diminished significantly and patient has begun to complain of abdominal pressure.   Patient presents to clinic today for evaluation.  He is afebrile and continues to eat and drink per his usual.  Drainage catheter appears to be retracted several inches with drainage around the insertion site.   Past Medical History:  Diagnosis Date  . Arthritis   . BPH (benign prostatic hyperplasia)   . Carotid artery occlusion   . Dementia   . Dysphagia   . Hyperlipidemia   . Hypertension   . Peripheral vascular disease ([place])   . Stroke [facility])    Right hemispheric CVA  . Umbilical hernia     Past Surgical History:  Procedure Laterality Date  . CAROTID ENDARTERECTOMY  10/06/2007   Right CEA by Dr. Amedeo Plenty  . IR CATHETER TUBE CHANGE  09/15/2017  . IR CATHETER TUBE CHANGE  12/03/2017  . IR CATHETER TUBE CHANGE  12/23/2017  . IR GENERIC HISTORICAL  07/09/2016   IR RADIOLOGIST EVAL & MGMT 07/09/2016 Arne Cleveland, MD [facility] INTERV RAD  . IR RADIOLOGIST EVAL & MGMT  09/15/2017  . PEG TUBE PLACEMENT  2008   after CVA    Allergies: Patient has no known allergies.  Medications: Prior to Admission medications   Medication Sig Start Date End Date Taking? Authorizing Provider  aspirin 81 MG tablet Take 81 mg by mouth daily.   Yes [provider]  benazepril (LOTENSIN) 40 MG tablet TAKE 1 TABLET BY MOUTH EVERY DAY 03/10/18  Yes Janith Lima, MD  clopidogrel  (PLAVIX) 75 MG tablet TAKE 1 TABLET (75 MG TOTAL) BY MOUTH DAILY. 03/10/18  Yes Janith Lima, MD  Colchicine 0.6 MG CAPS TAKE 1 CAPSULE BY MOUTH TWICE A DAY 03/30/18  Yes Janith Lima, MD  donepezil (ARICEPT) 10 MG tablet Take 1 tablet (10 mg total) by mouth at bedtime. 05/05/18  Yes Cameron Sprang, MD  levothyroxine (SYNTHROID, LEVOTHROID) 50 MCG tablet TAKE 1 TABLET (50 MCG TOTAL) BY MOUTH DAILY. 03/06/18  Yes Janith Lima, MD  memantine (NAMENDA) 10 MG tablet Take 1 tablet (10 mg total) by mouth 2 (two) times daily. 05/05/18  Yes Cameron Sprang, MD  simvastatin (ZOCOR) 20 MG tablet Take 20 mg by mouth daily.   Yes [provider]  triamcinolone cream (KENALOG) 0.5 % Apply 1 application topically 2 (two) times daily. 03/31/18  Yes Janith Lima, MD     No family history on file.  Social History   Socioeconomic History  . Marital status: Widowed    Spouse name: Not on file  . Number of children: Not on file  . Years of education: Not on file  . Highest education level: Not on file  Occupational History  . Occupation: Retired  Scientific laboratory technician  . Financial resource strain: Not on file  . Food insecurity:    Worry: Not on file    Inability: Not on file  . Transportation needs:    Medical: Not  on file    Non-medical: Not on file  Tobacco Use  . Smoking status: Former Research scientist (life sciences)  . Smokeless tobacco: Never Used  . Tobacco comment: remote h/o  Substance and Sexual Activity  . Alcohol use: No  . Drug use: No  . Sexual activity: Not on file  Lifestyle  . Physical activity:    Days per week: Not on file    Minutes per session: Not on file  . Stress: Not on file  Relationships  . Social connections:    Talks on phone: Not on file    Gets together: Not on file    Attends religious service: Not on file    Active member of club or organization: Not on file    Attends meetings of clubs or organizations: Not on file    Relationship status: Not on file  Other Topics Concern  .  Not on file  Social History Narrative  . Not on file     Vital Signs: BP 122/60   Pulse (!) 51   Temp 98 F (36.7 C) (Oral)   Resp 15   SpO2 97%   Physical Exam  Constitutional: He appears well-developed.  Nursing note and vitals reviewed. Abdomen:  Drain is intact but retracted from insertion site.  Skin site with drainage around the catheter.  No erythema or warmth.   Dressing replaced.   Imaging: Ct Abdomen Pelvis Wo Contrast  Result Date: 05/06/2018 CLINICAL DATA:  Follow-up drain EXAM: CT ABDOMEN AND PELVIS WITHOUT CONTRAST TECHNIQUE: Multidetector CT imaging of the abdomen and pelvis was performed following the standard protocol without IV contrast. COMPARISON:  09/30/2017 FINDINGS: Lower chest: Dependent atelectasis. Hepatobiliary: Gallstones. Small liver hypodensity at the dome of the left lobe is stable. Pancreas: Unremarkable Spleen: Unremarkable Adrenals/Urinary Tract: Several simple cysts in the kidneys are noted. Adrenal glands are unremarkable. There is wall thickening at the dome of the bladder. Stomach/Bowel: There is wall thickening at the sigmoid colon associated with diverticulosis. No evidence of small-bowel obstruction. Unremarkable stomach and duodenum. Vascular/Lymphatic: Atherosclerotic calcifications. Right and left common iliac artery diameters are 3.0 and 2.2 cm respectively. This compares with 2.8 and 2.1 cm on the prior study. Reproductive: Prostate is enlarged. Other: There is a gas filled abscess cavity between the sigmoid colon and bladder. The drain has retracted from the abscess cavity into the overlying subcutaneous fat. There is a fistula tract between the drain position and the abscess cavity. The abscess cavity is associated with the dome of the bladder. There is wall thickening and inflammatory change of the tip tissues. A fistula to the adjacent sigmoid colon is suspected. Musculoskeletal: No vertebral compression deformity. IMPRESSION: Lower abdominal  abscess drain has retracted outside of the abscess cavity and into the overlying subcutaneous fat. Inflammatory changes of the adjacent bladder and sigmoid colon are again noted. Bilateral common iliac artery aneurysm. Electronically Signed   By: Marybelle Killings M.D.   On: 05/06/2018 12:46    Labs:  CBC: Recent Labs    09/04/17 0533 09/05/17 0703 09/15/17 1241 09/29/17 1833  WBC 5.1 6.4 6.2 8.0  HGB 11.0* 12.2* 12.0* 12.4*  HCT 34.3* 38.4* 38.5* 39.2  PLT 137* 152 157 145*    COAGS: Recent Labs    09/03/17 1153 09/15/17 1241  INR 1.07 1.00  APTT  --  23*    BMP: Recent Labs    09/04/17 0533 09/05/17 0703 09/15/17 1241 09/29/17 1833  NA 139 138 140 140  K 4.2 4.4 4.2  4.7  CL 107 106 109 108  CO2 24 24 24 25   GLUCOSE 74 77 87 91  BUN 26* 21* 24* 22*  CALCIUM 8.4* 8.6* 8.5* 8.7*  CREATININE 2.36* 2.28* 2.25* 2.18*  GFRNONAA 24* 25* 25* 26*  GFRAA 28* 29* 29* 30*    LIVER FUNCTION TESTS: Recent Labs    07/30/17 1642 09/02/17 2232 09/29/17 1833  BILITOT 0.5 0.6 0.5  AST 15 32 22  ALT 9 17 14*  ALKPHOS 65 70 86  PROT 7.8 7.2 6.9  ALBUMIN 3.4* 3.2* 3.2*    Assessment: Chronic intra-abdominal fistula with permanent drain now retracted CT Abdomen Pelvis performed and reviewed with Dr. Barbie Banner. Imaging confirms drain is not correctly positioned.  Patient will be set up for drain exchange as soon as possible.  Called to [facility] and [facility] and set patient up for first available appointment which is tomorrow at [facility] at [facility].  Patient and daughter are made aware and verbalize understanding.   Signed: Docia Barrier, PA 05/06/2018, 1:38 PM   Please refer to Dr. Barbie Banner attestation of this note for management and plan.

## 2018-05-07 ENCOUNTER — Ambulatory Visit (HOSPITAL_COMMUNITY)
Admission: RE | Admit: 2018-05-07 | Discharge: 2018-05-07 | Disposition: A | Discharge status: Home / Self Care | Payer: Medicare Other | Source: Ambulatory Visit | Attending: Interventional Radiology | Admitting: Interventional Radiology

## 2018-05-07 ENCOUNTER — Encounter (HOSPITAL_COMMUNITY): Payer: Self-pay | Admitting: Interventional Radiology

## 2018-05-07 DIAGNOSIS — Y828 Other medical devices associated with adverse incidents: Secondary | ICD-10-CM | POA: Diagnosis not present

## 2018-05-07 DIAGNOSIS — T85628A Displacement of other specified internal prosthetic devices, implants and grafts, initial encounter: Secondary | ICD-10-CM | POA: Diagnosis present

## 2018-05-07 DIAGNOSIS — K750 Abscess of liver: Secondary | ICD-10-CM

## 2018-05-07 HISTORY — PX: IR CATHETER TUBE CHANGE: IMG717

## 2018-05-07 MED ORDER — LIDOCAINE HCL 1 % IJ SOLN
INTRAMUSCULAR | Status: AC
  Filled 2018-05-07: qty 20

## 2018-05-07 MED ORDER — LIDOCAINE HCL (PF) 1 % IJ SOLN
INTRAMUSCULAR | Status: DC | PRN
  Administered 2018-05-07: 5 mL

## 2018-05-07 MED ORDER — IOPAMIDOL (ISOVUE-300) INJECTION 61%
INTRAVENOUS | Status: AC
  Administered 2018-05-07: 20 mL
  Filled 2018-05-07: qty 50

## 2018-05-07 NOTE — Procedures (Signed)
Pre procedural Dx: Pelvic abscess Post procedural Dx: Same  Technically successful fluoro guided exchange and repositioning of pelvic abscess  EBL: Trace  Complications: None immediate  Ronny Bacon, MD Pager #: [phone]

## 2018-05-13 ENCOUNTER — Encounter: Payer: Self-pay | Admitting: Neurology

## 2018-05-27 ENCOUNTER — Other Ambulatory Visit: Payer: Self-pay

## 2018-05-27 ENCOUNTER — Encounter (HOSPITAL_COMMUNITY): Payer: Self-pay

## 2018-05-27 ENCOUNTER — Inpatient Hospital Stay (HOSPITAL_COMMUNITY)
Admission: EM | Admit: 2018-05-27 | Discharge: 2018-05-30 | DRG: 641 | Disposition: A | Discharge status: Home Health | Payer: Medicare Other | Attending: Internal Medicine | Admitting: Internal Medicine

## 2018-05-27 ENCOUNTER — Ambulatory Visit: Payer: Medicare Other | Admitting: Internal Medicine

## 2018-05-27 ENCOUNTER — Emergency Department (HOSPITAL_COMMUNITY): Payer: Medicare Other

## 2018-05-27 ENCOUNTER — Encounter: Payer: Self-pay | Admitting: Internal Medicine

## 2018-05-27 VITALS — BP 106/64 | HR 61 | Temp 98.1°F | Ht 71.0 in

## 2018-05-27 DIAGNOSIS — E860 Dehydration: Principal | ICD-10-CM | POA: Diagnosis present

## 2018-05-27 DIAGNOSIS — Z79890 Hormone replacement therapy (postmenopausal): Secondary | ICD-10-CM

## 2018-05-27 DIAGNOSIS — K869 Disease of pancreas, unspecified: Secondary | ICD-10-CM

## 2018-05-27 DIAGNOSIS — N184 Chronic kidney disease, stage 4 (severe): Secondary | ICD-10-CM | POA: Diagnosis present

## 2018-05-27 DIAGNOSIS — E878 Other disorders of electrolyte and fluid balance, not elsewhere classified: Secondary | ICD-10-CM | POA: Diagnosis present

## 2018-05-27 DIAGNOSIS — D539 Nutritional anemia, unspecified: Secondary | ICD-10-CM | POA: Diagnosis present

## 2018-05-27 DIAGNOSIS — Z7982 Long term (current) use of aspirin: Secondary | ICD-10-CM

## 2018-05-27 DIAGNOSIS — K8689 Other specified diseases of pancreas: Secondary | ICD-10-CM

## 2018-05-27 DIAGNOSIS — E785 Hyperlipidemia, unspecified: Secondary | ICD-10-CM | POA: Diagnosis present

## 2018-05-27 DIAGNOSIS — R32 Unspecified urinary incontinence: Secondary | ICD-10-CM | POA: Diagnosis present

## 2018-05-27 DIAGNOSIS — F0391 Unspecified dementia with behavioral disturbance: Secondary | ICD-10-CM | POA: Diagnosis present

## 2018-05-27 DIAGNOSIS — N179 Acute kidney failure, unspecified: Secondary | ICD-10-CM | POA: Diagnosis present

## 2018-05-27 DIAGNOSIS — Z7902 Long term (current) use of antithrombotics/antiplatelets: Secondary | ICD-10-CM

## 2018-05-27 DIAGNOSIS — E872 Acidosis: Secondary | ICD-10-CM | POA: Diagnosis present

## 2018-05-27 DIAGNOSIS — E875 Hyperkalemia: Secondary | ICD-10-CM | POA: Diagnosis present

## 2018-05-27 DIAGNOSIS — E861 Hypovolemia: Secondary | ICD-10-CM | POA: Diagnosis present

## 2018-05-27 DIAGNOSIS — I10 Essential (primary) hypertension: Secondary | ICD-10-CM | POA: Diagnosis not present

## 2018-05-27 DIAGNOSIS — R627 Adult failure to thrive: Secondary | ICD-10-CM | POA: Diagnosis present

## 2018-05-27 DIAGNOSIS — I9589 Other hypotension: Secondary | ICD-10-CM | POA: Diagnosis present

## 2018-05-27 DIAGNOSIS — R159 Full incontinence of feces: Secondary | ICD-10-CM | POA: Diagnosis not present

## 2018-05-27 DIAGNOSIS — I959 Hypotension, unspecified: Secondary | ICD-10-CM

## 2018-05-27 DIAGNOSIS — I739 Peripheral vascular disease, unspecified: Secondary | ICD-10-CM | POA: Diagnosis present

## 2018-05-27 DIAGNOSIS — E039 Hypothyroidism, unspecified: Secondary | ICD-10-CM | POA: Diagnosis present

## 2018-05-27 DIAGNOSIS — F0390 Unspecified dementia without behavioral disturbance: Secondary | ICD-10-CM

## 2018-05-27 DIAGNOSIS — E032 Hypothyroidism due to medicaments and other exogenous substances: Secondary | ICD-10-CM

## 2018-05-27 DIAGNOSIS — K862 Cyst of pancreas: Secondary | ICD-10-CM | POA: Insufficient documentation

## 2018-05-27 DIAGNOSIS — R296 Repeated falls: Secondary | ICD-10-CM | POA: Diagnosis present

## 2018-05-27 DIAGNOSIS — E870 Hyperosmolality and hypernatremia: Secondary | ICD-10-CM | POA: Diagnosis present

## 2018-05-27 DIAGNOSIS — Z8673 Personal history of transient ischemic attack (TIA), and cerebral infarction without residual deficits: Secondary | ICD-10-CM | POA: Diagnosis not present

## 2018-05-27 DIAGNOSIS — F03B0 Unspecified dementia, moderate, without behavioral disturbance, psychotic disturbance, mood disturbance, and anxiety: Secondary | ICD-10-CM

## 2018-05-27 DIAGNOSIS — D6959 Other secondary thrombocytopenia: Secondary | ICD-10-CM | POA: Diagnosis present

## 2018-05-27 DIAGNOSIS — N189 Chronic kidney disease, unspecified: Secondary | ICD-10-CM

## 2018-05-27 DIAGNOSIS — I129 Hypertensive chronic kidney disease with stage 1 through stage 4 chronic kidney disease, or unspecified chronic kidney disease: Secondary | ICD-10-CM | POA: Diagnosis present

## 2018-05-27 DIAGNOSIS — Z87891 Personal history of nicotine dependence: Secondary | ICD-10-CM | POA: Diagnosis not present

## 2018-05-27 DIAGNOSIS — R197 Diarrhea, unspecified: Secondary | ICD-10-CM | POA: Diagnosis not present

## 2018-05-27 LAB — CBC WITH DIFFERENTIAL/PLATELET
Abs Immature Granulocytes: 0.1 10*3/uL (ref 0.0–0.1)
BASOS ABS: 0.1 10*3/uL (ref 0.0–0.1)
Basophils Relative: 1 %
EOS ABS: 0 10*3/uL (ref 0.0–0.7)
EOS PCT: 0 %
HCT: 41.7 % (ref 39.0–52.0)
HEMOGLOBIN: 13.2 g/dL (ref 13.0–17.0)
Immature Granulocytes: 1 %
Lymphocytes Relative: 18 %
Lymphs Abs: 1.1 10*3/uL (ref 0.7–4.0)
MCH: 31.2 pg (ref 26.0–34.0)
MCHC: 31.7 g/dL (ref 30.0–36.0)
MCV: 98.6 fL (ref 78.0–100.0)
MONO ABS: 0.7 10*3/uL (ref 0.1–1.0)
Monocytes Relative: 12 %
Neutro Abs: 4 10*3/uL (ref 1.7–7.7)
Neutrophils Relative %: 68 %
Platelets: 209 10*3/uL (ref 150–400)
RBC: 4.23 MIL/uL (ref 4.22–5.81)
RDW: 15.7 % — AB (ref 11.5–15.5)
WBC: 5.9 10*3/uL (ref 4.0–10.5)

## 2018-05-27 LAB — COMPREHENSIVE METABOLIC PANEL
ALBUMIN: 2.9 g/dL — AB (ref 3.5–5.0)
ALK PHOS: 82 U/L (ref 38–126)
ALT: 92 U/L — AB (ref 17–63)
AST: 171 U/L — AB (ref 15–41)
Anion gap: 8 (ref 5–15)
BILIRUBIN TOTAL: 0.8 mg/dL (ref 0.3–1.2)
BUN: 44 mg/dL — AB (ref 6–20)
CALCIUM: 8.9 mg/dL (ref 8.9–10.3)
CO2: 17 mmol/L — ABNORMAL LOW (ref 22–32)
CREATININE: 3.07 mg/dL — AB (ref 0.61–1.24)
Chloride: 117 mmol/L — ABNORMAL HIGH (ref 101–111)
GFR calc Af Amer: 20 mL/min — ABNORMAL LOW (ref >60)
GFR, EST NON AFRICAN AMERICAN: 17 mL/min — AB (ref >60)
GLUCOSE: 120 mg/dL — AB (ref 65–99)
Potassium: 5.2 mmol/L — ABNORMAL HIGH (ref 3.5–5.1)
Sodium: 142 mmol/L (ref 135–145)
TOTAL PROTEIN: 7.3 g/dL (ref 6.5–8.1)

## 2018-05-27 LAB — URINALYSIS, ROUTINE W REFLEX MICROSCOPIC
Bilirubin Urine: NEGATIVE
Glucose, UA: NEGATIVE mg/dL
KETONES UR: NEGATIVE mg/dL
Nitrite: NEGATIVE
PH: 5 (ref 5.0–8.0)
PROTEIN: NEGATIVE mg/dL
Specific Gravity, Urine: 1.015 (ref 1.005–1.030)

## 2018-05-27 LAB — I-STAT TROPONIN, ED: TROPONIN I, POC: 0.08 ng/mL (ref 0.00–0.08)

## 2018-05-27 LAB — POC OCCULT BLOOD, ED: Fecal Occult Bld: NEGATIVE

## 2018-05-27 LAB — I-STAT CG4 LACTIC ACID, ED: Lactic Acid, Venous: 1.47 mmol/L (ref 0.5–1.9)

## 2018-05-27 MED ORDER — HEPARIN SODIUM (PORCINE) 5000 UNIT/ML IJ SOLN
5000.0000 [IU] | Freq: Three times a day (TID) | INTRAMUSCULAR | Status: DC
  Administered 2018-05-28 – 2018-05-30 (×5): 5000 [IU] via SUBCUTANEOUS
  Filled 2018-05-27 (×6): qty 1

## 2018-05-27 MED ORDER — SODIUM CHLORIDE 0.9% FLUSH
3.0000 mL | Freq: Two times a day (BID) | INTRAVENOUS | Status: DC
  Administered 2018-05-28 – 2018-05-29 (×3): 3 mL via INTRAVENOUS

## 2018-05-27 MED ORDER — ONDANSETRON HCL 4 MG/2ML IJ SOLN: 4.0000 mg | Freq: Four times a day (QID) | INTRAMUSCULAR | Status: DC | PRN

## 2018-05-27 MED ORDER — ACETAMINOPHEN 325 MG PO TABS: 650.0000 mg | ORAL_TABLET | Freq: Four times a day (QID) | ORAL | Status: DC | PRN

## 2018-05-27 MED ORDER — ASPIRIN EC 81 MG PO TBEC
81.0000 mg | DELAYED_RELEASE_TABLET | Freq: Every day | ORAL | Status: DC
  Administered 2018-05-28 – 2018-05-30 (×3): 81 mg via ORAL
  Filled 2018-05-27 (×3): qty 1

## 2018-05-27 MED ORDER — SIMVASTATIN 20 MG PO TABS
20.0000 mg | ORAL_TABLET | Freq: Every day | ORAL | Status: DC
  Administered 2018-05-28 – 2018-05-29 (×2): 20 mg via ORAL
  Filled 2018-05-27 (×2): qty 1

## 2018-05-27 MED ORDER — ACETAMINOPHEN 650 MG RE SUPP: 650.0000 mg | Freq: Four times a day (QID) | RECTAL | Status: DC | PRN

## 2018-05-27 MED ORDER — PIPERACILLIN-TAZOBACTAM 3.375 G IVPB
3.3750 g | Freq: Three times a day (TID) | INTRAVENOUS | Status: DC
Start: 2018-05-28 — End: 2018-05-27

## 2018-05-27 MED ORDER — HYDROCODONE-ACETAMINOPHEN 5-325 MG PO TABS: 1.0000 | ORAL_TABLET | ORAL | Status: DC | PRN

## 2018-05-27 MED ORDER — SODIUM CHLORIDE 0.9 % IV BOLUS
2000.0000 mL | Freq: Once | INTRAVENOUS | Status: AC
  Administered 2018-05-27: 2000 mL via INTRAVENOUS

## 2018-05-27 MED ORDER — VANCOMYCIN HCL IN DEXTROSE 1-5 GM/200ML-% IV SOLN: 1000.0000 mg | Freq: Two times a day (BID) | INTRAVENOUS | Status: DC

## 2018-05-27 MED ORDER — SODIUM CHLORIDE 0.9 % IV SOLN
2000.0000 mg | Freq: Once | INTRAVENOUS | Status: AC
  Administered 2018-05-27: 2000 mg via INTRAVENOUS
  Filled 2018-05-27: qty 2000

## 2018-05-27 MED ORDER — SODIUM CHLORIDE 0.9 % IV SOLN
INTRAVENOUS | Status: AC
  Administered 2018-05-28: 01:00:00 via INTRAVENOUS

## 2018-05-27 MED ORDER — CLOPIDOGREL BISULFATE 75 MG PO TABS
75.0000 mg | ORAL_TABLET | Freq: Every day | ORAL | Status: DC
  Administered 2018-05-28 – 2018-05-30 (×3): 75 mg via ORAL
  Filled 2018-05-27 (×3): qty 1

## 2018-05-27 MED ORDER — DONEPEZIL HCL 10 MG PO TABS
10.0000 mg | ORAL_TABLET | Freq: Every day | ORAL | Status: DC
  Administered 2018-05-28 – 2018-05-29 (×2): 10 mg via ORAL
  Filled 2018-05-27 (×4): qty 1

## 2018-05-27 MED ORDER — SODIUM CHLORIDE 0.9 % IV SOLN
500.0000 mg | INTRAVENOUS | Status: DC
  Administered 2018-05-28: 500 mg via INTRAVENOUS
  Filled 2018-05-27 (×2): qty 500

## 2018-05-27 MED ORDER — ONDANSETRON HCL 4 MG PO TABS: 4.0000 mg | ORAL_TABLET | Freq: Four times a day (QID) | ORAL | Status: DC | PRN

## 2018-05-27 MED ORDER — PIPERACILLIN-TAZOBACTAM 3.375 G IVPB 30 MIN
3.3750 g | Freq: Once | INTRAVENOUS | Status: AC
  Administered 2018-05-27: 3.375 g via INTRAVENOUS
  Filled 2018-05-27: qty 50

## 2018-05-27 MED ORDER — LEVOTHYROXINE SODIUM 50 MCG PO TABS
50.0000 ug | ORAL_TABLET | Freq: Every day | ORAL | Status: DC
  Administered 2018-05-28 – 2018-05-30 (×3): 50 ug via ORAL
  Filled 2018-05-27 (×4): qty 1

## 2018-05-27 MED ORDER — VANCOMYCIN HCL IN DEXTROSE 1-5 GM/200ML-% IV SOLN: 1000.0000 mg | Freq: Once | INTRAVENOUS | Status: DC

## 2018-05-27 NOTE — Assessment & Plan Note (Signed)
To hold ACE for now

## 2018-05-27 NOTE — Assessment & Plan Note (Signed)
Afeb, not septic but likely infectious with lower BP in office today with cont'd benazepril possibly an element of his weeakness and reduced ambulation; I have high suspicion for infectious etiology, and is high risk for fall; will defer outpatient management for now and ask famly to take pt to ED

## 2018-05-27 NOTE — H&P (Addendum)
History and Physical    Robert Barnett AUQ:[id number] DOB: 1932/02/06 DOA: 05/27/2018  PCP: Janith Lima, MD   Patient coming from: Home  Chief Complaint: Diarrhea, gen weakness   HPI: Robert Barnett is a 84 y.o. male with medical history significant for hypertension, chronic kidney disease stage IV, dementia, chronic abdominal abscess with percutaneous drain, history of CVA, and hypothyroidism, now presenting to the emergency department at the direction of his PCP for evaluation of diarrhea, generalized weakness, and hypotension.  Patient had been in his usual state until approximately 3 weeks ago when he developed diarrhea.  He had just started Namenda, this was suspected to be the culprit, and discontinued.  Despite stopping Namenda, diarrhea has persisted with several episodes daily.  There is no associated abdominal pain or vomiting and patient denies fevers or chills.  He has not been on antibiotics recently.  No recent travel and no sick contacts reported.  He was evaluated by his PCP today and noted to be hypotensive in the office.  He was directed to the ED for further evaluation.  ED Course: Upon arrival to the ED, patient is found to be afebrile, saturating well on room air, bradycardic in the mid 50s, and with blood pressure 74/45.  EKG features a normal sinus rhythm and chest x-ray is notable for mild bibasilar atelectasis.  Chemistry panel features a potassium 5.2, bicarbonate 17, BUN 44, and creatinine 3.07, up from 2.2 last October.  CBC is unremarkable, lactic acid is normal, and troponin is within normal limits.  Fecal occult blood testing is negative.  Blood cultures were collected, 2 L of normal saline administered, and the patient was started on vancomycin and Zosyn.  Blood pressure normalized with the IV fluids and the patient has remained stable.  He will be admitted for ongoing evaluation and management of hypotension, likely secondary to hypovolemia in the setting of  diarrhea.  Review of Systems:  All other systems reviewed and apart from HPI, are negative.  Past Medical History:  Diagnosis Date  . Arthritis   . BPH (benign prostatic hyperplasia)   . Carotid artery occlusion   . Dementia   . Dysphagia   . Hyperlipidemia   . Hypertension   . Peripheral vascular disease ([place])   . Stroke [facility])    Right hemispheric CVA  . Umbilical hernia     Past Surgical History:  Procedure Laterality Date  . CAROTID ENDARTERECTOMY  10/06/2007   Right CEA by Dr. Amedeo Plenty  . IR CATHETER TUBE CHANGE  09/15/2017  . IR CATHETER TUBE CHANGE  12/03/2017  . IR CATHETER TUBE CHANGE  12/23/2017  . IR CATHETER TUBE CHANGE  05/07/2018  . IR GENERIC HISTORICAL  07/09/2016   IR RADIOLOGIST EVAL & MGMT 07/09/2016 Arne Cleveland, MD [facility] INTERV RAD  . IR RADIOLOGIST EVAL & MGMT  09/15/2017  . IR RADIOLOGIST EVAL & MGMT  05/06/2018  . PEG TUBE PLACEMENT  2008   after CVA     reports that he has quit smoking. He has never used smokeless tobacco. He reports that he does not drink alcohol or use drugs.  No Known Allergies  Family History  Problem Relation Age of Onset  . Obesity Daughter      Prior to Admission medications   Medication Sig Start Date End Date Taking? Authorizing Provider  aspirin 81 MG tablet Take 81 mg by mouth daily.   Yes [provider]  benazepril (LOTENSIN) 40 MG tablet TAKE 1 TABLET  BY MOUTH EVERY DAY 03/10/18  Yes Janith Lima, MD  clopidogrel (PLAVIX) 75 MG tablet TAKE 1 TABLET (75 MG TOTAL) BY MOUTH DAILY. 03/10/18  Yes Janith Lima, MD  Colchicine 0.6 MG CAPS TAKE 1 CAPSULE BY MOUTH TWICE A DAY 03/30/18  Yes Janith Lima, MD  donepezil (ARICEPT) 10 MG tablet Take 1 tablet (10 mg total) by mouth at bedtime. 05/05/18  Yes Cameron Sprang, MD  levothyroxine (SYNTHROID, LEVOTHROID) 50 MCG tablet TAKE 1 TABLET (50 MCG TOTAL) BY MOUTH DAILY. 03/06/18  Yes Janith Lima, MD  simvastatin (ZOCOR) 20 MG tablet Take 20 mg by mouth daily.   Yes  [provider]  triamcinolone cream (KENALOG) 0.5 % Apply 1 application topically 2 (two) times daily. 03/31/18  Yes Janith Lima, MD  memantine (NAMENDA) 10 MG tablet Take 1 tablet (10 mg total) by mouth 2 (two) times daily. Patient not taking: Reported on 05/27/2018 05/05/18   Cameron Sprang, MD    Physical Exam: Vitals:   05/27/18 1715 05/27/18 1745 05/27/18 1800 05/27/18 1815  BP: (!) 112/54 102/60 (!) 113/56 124/67  Pulse: (!) 53 (!) 51 (!) 56 (!) 58  Resp: 14 15 18 19   Temp:      TempSrc:      SpO2: 96% 99% 96% 94%      Constitutional: NAD, calm  Eyes: PERTLA, lids and conjunctivae normal ENMT: Mucous membranes are moist. Posterior pharynx clear of any exudate or lesions.   Neck: normal, supple, no masses, no thyromegaly Respiratory: clear to auscultation bilaterally, no wheezing, no crackles. Normal respiratory effort.    Cardiovascular: S1 & S2 heard, regular rate and rhythm. No extremity edema. No significant JVD. Abdomen: No distension, no tenderness, soft. Drain in RLQ. Bowel sounds normal.  Musculoskeletal: no clubbing / cyanosis. No joint deformity upper and lower extremities.  Skin: no significant rashes, lesions, ulcers. Poor turgor. Neurologic: No facial asymmetry. Sensation to light touch intact. Moving all extremities.  Psychiatric: Alert and oriented to person, place, and situation. Pleasant and cooperative.     Labs on Admission: I have personally reviewed following labs and imaging studies  CBC: Recent Labs  Lab 05/27/18 1515  WBC 5.9  NEUTROABS 4.0  HGB 13.2  HCT 41.7  MCV 98.6  PLT 660   Basic Metabolic Panel: Recent Labs  Lab 05/27/18 1515  NA 142  K 5.2*  CL 117*  CO2 17*  GLUCOSE 120*  BUN 44*  CREATININE 3.07*  CALCIUM 8.9   GFR: CrCl cannot be calculated (Unknown ideal weight.). Liver Function Tests: Recent Labs  Lab 05/27/18 1515  AST 171*  ALT 92*  ALKPHOS 82  BILITOT 0.8  PROT 7.3  ALBUMIN 2.9*   No  results for input(s): LIPASE, AMYLASE in the last 168 hours. No results for input(s): AMMONIA in the last 168 hours. Coagulation Profile: No results for input(s): INR, PROTIME in the last 168 hours. Cardiac Enzymes: No results for input(s): CKTOTAL, CKMB, CKMBINDEX, TROPONINI in the last 168 hours. BNP (last 3 results) No results for input(s): PROBNP in the last 8760 hours. HbA1C: No results for input(s): HGBA1C in the last 72 hours. CBG: No results for input(s): GLUCAP in the last 168 hours. Lipid Profile: No results for input(s): CHOL, HDL, LDLCALC, TRIG, CHOLHDL, LDLDIRECT in the last 72 hours. Thyroid Function Tests: No results for input(s): TSH, T4TOTAL, FREET4, T3FREE, THYROIDAB in the last 72 hours. Anemia Panel: No results for input(s): VITAMINB12, FOLATE, FERRITIN, TIBC,  IRON, RETICCTPCT in the last 72 hours. Urine analysis:    Component Value Date/Time   COLORURINE YELLOW 05/27/2018 1824   APPEARANCEUR HAZY (A) 05/27/2018 1824   LABSPEC 1.015 05/27/2018 1824   PHURINE 5.0 05/27/2018 1824   GLUCOSEU NEGATIVE 05/27/2018 1824   GLUCOSEU NEGATIVE 12/30/2017 1443   HGBUR LARGE (A) 05/27/2018 1824   BILIRUBINUR NEGATIVE 05/27/2018 1824   KETONESUR NEGATIVE 05/27/2018 1824   PROTEINUR NEGATIVE 05/27/2018 1824   UROBILINOGEN 0.2 12/30/2017 1443   NITRITE NEGATIVE 05/27/2018 1824   LEUKOCYTESUR TRACE (A) 05/27/2018 1824   Sepsis Labs: @LABRCNTIP (procalcitonin:4,lacticidven:4) )No results found for this or any previous visit (from the past 240 hour(s)).   Radiological Exams on Admission: Ct Abdomen Pelvis Wo Contrast  Result Date: 05/27/2018 CLINICAL DATA:  Abdominal pain and fever. Abscess suspected. Patient had an abdomen abscess drained earlier this month. EXAM: CT ABDOMEN AND PELVIS WITHOUT CONTRAST TECHNIQUE: Multidetector CT imaging of the abdomen and pelvis was performed following the standard protocol without IV contrast. COMPARISON:  05/06/2018 FINDINGS: Lower  chest: Heart is normal size. Three-vessel coronary artery calcifications. Minor lung base subsegmental atelectasis. Persistent partly visualized right breast gynecomastia, which has been previously evaluated Hepatobiliary: Liver is unremarkable. There are dependent gallstones. No acute cholecystitis. No bile duct dilation. Pancreas: Small cystic lesion noted along the superior margin of the pancreatic neck and body measuring 2 cm. No other pancreatic abnormality. Spleen: Normal in size without focal abnormality. Adrenals/Urinary Tract: Bilateral renal cortical thinning. Posterior midpole right renal cyst. No other masses. No stones. No hydronephrosis. Normal ureters. Bladder wall is thickened anteriorly and superiorly adjacent to the drainage catheter, presumed reactive. No bladder mass or stone. Stomach/Bowel: Stomach and small bowel unremarkable. There are numerous colonic diverticula. Adjacent to the mid sigmoid colon, there is a pigtail catheter, which abuts the anterior superior bladder. The previous collection been completely evacuated. There is no evidence of residual abscess or of a new abscess. The adjacent sigmoid colon wall is without significant thickening. There is subtle inflammatory haziness adjacent to the decompressed prior collection and the mid sigmoid colon. No other colonic abnormality.  Normal appendix visualized. Vascular/Lymphatic: Aortic atherosclerosis. 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 Left common iliac artery is dilated to 2.5 cm. These findings are stable. No pathologically enlarged lymph nodes. Reproductive: Prostate is enlarged, but unchanged from the recent prior studies. Other: No ascites. Musculoskeletal: No fracture or acute finding. No osteoblastic or osteolytic lesions. IMPRESSION: 1. No evidence of a residual pelvic abscess or of a new abscess. The pigtail catheter lies just above the anterior superior bladder and below the mid sigmoid colon, completely evacuating  the previous collection. 2. There are mild inflammatory changes adjacent to the mid sigmoid colon, pigtail catheter and superior bladder. This is consistent with mild residual infection/inflammation. This has improved from the most recent prior CT. 3. Small cystic area in the anterior superior pancreatic neck and body measuring 2 cm. Recommend follow-up pancreatic protocol CT every 2 years x2 to establish stability. This follows consensus criteria for patients of this age. 4. 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 Electronically Signed   By: Lajean Manes M.D.   On: 05/27/2018 18:07   Dg Chest 2 View  Result Date: 05/27/2018 CLINICAL DATA:  Vomiting and diarrhea. Weakness. Low blood pressure. EXAM: CHEST - 2 VIEW COMPARISON:  No recent. FINDINGS: Mediastinum and hilar structures normal. Low lung volumes. Mild bibasilar atelectasis/infiltrates no pleural  effusion or pneumothorax. Cardiomegaly with normal pulmonary vascularity. Degenerative change thoracic spine. IMPRESSION: 1. Mild bibasilar atelectasis/infiltrates. These are best identified on lateral view. 2.  Mild cardiomegaly with normal pulmonary vascularity. Electronically Signed   By: Marcello Moores  Register   On: 05/27/2018 17:02    EKG: Independently reviewed. Sinus rhythm.   Assessment/Plan  1. Hypotension; hx of HTN   - Presents with 3 wks of diarrhea, gen weakness, and hypotension in PCP clinic  - BP 74/45 on arrival, responded well to 2 liters NS in ED  - Likely secondary to hypovolemia in setting of 3 wks diarrhea  - Continue IVF hydration, hold antihypertensives, eval & mgmt of diarrhea as below    2. Diarrhea  - Reports 3 wks of diarrhea, several episodes daily, no abd pain or vomiting, no travel, no recent abx  - Started when he began taking Namenda, but has persisted for >1wk after stopping  - With resulting hypovolemia and hypotension, will check GI  pathogen panel and C diff, treat empirically with azithromycin  - Continue IVF hydration, hold colchicine, monitor lytes   3. Acute kidney injury superimposed on CKD stage IV  - SCr is 3.07 on admission, up from apparent baseline of ~2.2  - Likely prerenal azotemia in setting of diarrhea and hypotension  - Treated in ED with 2 liters NS and continued no IVF hydration  - Renally-dose medications, avoid nephrotoxins, repeat chem panel in am    4. Dementia  - Continue Aricept   5. Hx of CVA  - No acute deficits  - Continue ASA, Plavix, and statin    6. Chronic abdominal abscess  - CT abd/pelvis with resolution of abscess, no new abscess  7. Hypothyroidism  - Continue Synthroid    8. Pancreatic cyst  - Noted incidentally on CT with follow-up imaging recommended    DVT prophylaxis: sq heparin  Code Status: Full  Family Communication: Daughters updated at bedside Consults called: None  Admission status: Inpatient    Vianne Bulls, MD [facility] Pager [phone]  If 7PM-7AM, please contact night-coverage [email or web address] Password [facility]  05/27/2018, 8:57 PM

## 2018-05-27 NOTE — ED Notes (Signed)
ED tech attempted to collect first set of blood cultures. Unsuccessful. This RN collected 1st set with Kurin device. Phleb at bedside to collect second set at this time.

## 2018-05-27 NOTE — ED Notes (Addendum)
Patient transported to X-ray 

## 2018-05-27 NOTE — Assessment & Plan Note (Signed)
Would agree with staying off namenda for now

## 2018-05-27 NOTE — ED Triage Notes (Signed)
Pt has had increased weakness X5 days. Pt was sent over from his PCP today for low blood pressure. Pt has also had vomiting and diarrhea.

## 2018-05-27 NOTE — ED Provider Notes (Signed)
[facility]ERGENCY DEPARTMENT Provider Note   CSN: [id number] Arrival date & time: 05/27/18  1447     History   Chief Complaint Chief Complaint  Patient presents with  . Hypotension    HPI ATREUS Barnett is a 84 y.o. male.  HPI 84 year old African-American male past medical history significant for hyperlipidemia, hypertension, prior CVA, dementia, AAA, abdominal wall abscess with abdominal fistula presents to the emergency department today after being sent by PCP for low blood pressure.  Both patient and family are poor historians.  They do report that over the past week patient has had increased weakness.  States for the past 2 weeks she has had loose stools.  They are not sure if these were with any melena or hematochezia.  They also report patient has had some vomiting and nausea as well.  Patient denies any dizziness but just reports generalized weakness.  He also reports exertional shortness of breath but denies any associated chest pain.  They do report several falls though not clear how many due to dementia and not saying anything.  They report increased confusion.  Patient having more frequent and severe bowel incontinence and urinary incontinence.  Patient does report generalized abdominal pain.  Patient has intact drainage system from remote abdominal wall abscess that was repaired 2 months ago.  Patient has had no recent antibiotics.  No history of C. difficile.  No recent UTI.  This was checked by primary care.   There is a Level V caveat due to dementia and ams.    Past Medical History:  Diagnosis Date  . Arthritis   . BPH (benign prostatic hyperplasia)   . Carotid artery occlusion   . Dementia   . Dysphagia   . Hyperlipidemia   . Hypertension   . Peripheral vascular disease ([place])   . Stroke [facility])    Right hemispheric CVA  . Umbilical hernia     Patient Active Problem List   Diagnosis Date Noted  . Bowel and bladder incontinence 05/27/2018    . Eczema 03/31/2018  . Acute left flank pain 12/24/2017  . Dysuria 12/24/2017  . 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 CKD (chronic kidney disease) stage 4, GFR 15-29 ml/min (HCC) 09/03/2017  . AAA (abdominal aortic aneurysm) ([place]) 09/03/2017  . Abdominal wall abscess 09/02/2017  . Essential hypertension 07/30/2017  . Acute idiopathic gout of right foot 07/30/2017  . Abdominal aortic atherosclerosis ([place]) 08/19/2016  . Bilateral low back pain without sciatica 08/18/2016  . Hyperglycemia 07/23/2016  . Hypothyroidism 07/23/2016  . Moderate dementia without behavioral disturbance 07/23/2016  . Abdominal fistula 11/22/2014  . Hyperlipidemia 11/17/2014  . Dysphagia   . Stroke ([place])   . BPH (benign prostatic hyperplasia)     Past Surgical History:  Procedure Laterality Date  . CAROTID ENDARTERECTOMY  10/06/2007   Right CEA by Dr. Amedeo Plenty  . IR CATHETER TUBE CHANGE  09/15/2017  . IR CATHETER TUBE CHANGE  12/03/2017  . IR CATHETER TUBE CHANGE  12/23/2017  . IR CATHETER TUBE CHANGE  05/07/2018  . IR GENERIC HISTORICAL  07/09/2016   IR RADIOLOGIST EVAL & MGMT 07/09/2016 Arne Cleveland, MD [facility] INTERV RAD  . IR RADIOLOGIST EVAL & MGMT  09/15/2017  . IR RADIOLOGIST EVAL & MGMT  05/06/2018  . PEG TUBE PLACEMENT  2008   after CVA        Home Medications    Prior to Admission medications   Medication Sig Start Date End Date Taking? Authorizing  Provider  aspirin 81 MG tablet Take 81 mg by mouth daily.    [provider]  benazepril (LOTENSIN) 40 MG tablet TAKE 1 TABLET BY MOUTH EVERY DAY 03/10/18   Janith Lima, MD  clopidogrel (PLAVIX) 75 MG tablet TAKE 1 TABLET (75 MG TOTAL) BY MOUTH DAILY. 03/10/18   Janith Lima, MD  Colchicine 0.6 MG CAPS TAKE 1 CAPSULE BY MOUTH TWICE A DAY 03/30/18   Janith Lima, MD  donepezil (ARICEPT) 10 MG tablet Take 1 tablet (10 mg total) by mouth at bedtime. 05/05/18   Cameron Sprang, MD  levothyroxine (SYNTHROID, LEVOTHROID) 50 MCG tablet TAKE 1  TABLET (50 MCG TOTAL) BY MOUTH DAILY. 03/06/18   Janith Lima, MD  memantine (NAMENDA) 10 MG tablet Take 1 tablet (10 mg total) by mouth 2 (two) times daily. 05/05/18   Cameron Sprang, MD  simvastatin (ZOCOR) 20 MG tablet Take 20 mg by mouth daily.    [provider]  triamcinolone cream (KENALOG) 0.5 % Apply 1 application topically 2 (two) times daily. 03/31/18   Janith Lima, MD    Family History History reviewed. No pertinent family history.  Social History Social History   Tobacco Use  . Smoking status: Former Research scientist (life sciences)  . Smokeless tobacco: Never Used  . Tobacco comment: remote h/o  Substance Use Topics  . Alcohol use: No  . Drug use: No     Allergies   Patient has no known allergies.   Review of Systems Review of Systems  Unable to perform ROS: Dementia     Physical Exam Updated Vital Signs BP (!) 94/58   Pulse 70   Temp 97.7 F (36.5 C) (Oral)   Resp (!) 23   SpO2 95%   Physical Exam  Constitutional: He is oriented to person, place, and time. He appears well-developed and well-nourished.  Non-toxic appearance. No distress.  HENT:  Head: Normocephalic and atraumatic.  Mucous membranes are dry.  Eyes: Pupils are equal, round, and reactive to light. Conjunctivae are normal. Right eye exhibits no discharge. Left eye exhibits no discharge.  Neck: Normal range of motion. Neck supple.  Cardiovascular: Normal rate, regular rhythm, normal heart sounds and intact distal pulses. Exam reveals no gallop and no friction rub.  No murmur heard. Pulmonary/Chest: Effort normal and breath sounds normal. No stridor. No respiratory distress. He has no wheezes. He has no rales. He exhibits no tenderness.  Abdominal: Soft. Normal appearance and bowel sounds are normal. He exhibits no distension. There is generalized tenderness. There is guarding. There is no rigidity, no rebound, no CVA tenderness, no tenderness at McBurney's point and negative Murphy's sign.  Patient has  drain to the anterior abdomen that is draining an abscess in his abdominal wall.  There is no cellulitic skin around the drain site.  It is draining brown fluid.  Musculoskeletal: Normal range of motion. He exhibits no tenderness.  Lymphadenopathy:    He has no cervical adenopathy.  Neurological: He is alert and oriented to person, place, and time.  Skin: Skin is warm and dry. Capillary refill takes less than 2 seconds. No rash noted. He is not diaphoretic. There is pallor.  Poor skin turgor.  Psychiatric: His behavior is normal. Judgment and thought content normal.  Nursing note and vitals reviewed.    ED Treatments / Results  Labs (all labs ordered are listed, but only abnormal results are displayed) Labs Reviewed  COMPREHENSIVE METABOLIC PANEL - Abnormal; Notable for the following  components:      Result Value   Potassium 5.2 (*)    Chloride 117 (*)    CO2 17 (*)    Glucose, Bld 120 (*)    BUN 44 (*)    Creatinine, Ser 3.07 (*)    Albumin 2.9 (*)    AST 171 (*)    ALT 92 (*)    GFR calc non Af Amer 17 (*)    GFR calc Af Amer 20 (*)    All other components within normal limits  CBC WITH DIFFERENTIAL/PLATELET - Abnormal; Notable for the following components:   RDW 15.7 (*)    All other components within normal limits  CULTURE, BLOOD (ROUTINE X 2)  CULTURE, BLOOD (ROUTINE X 2)  URINALYSIS, ROUTINE W REFLEX MICROSCOPIC  I-STAT CG4 LACTIC ACID, ED  POC OCCULT BLOOD, ED  I-STAT TROPONIN, ED    EKG EKG Interpretation  Date/Time:  Thursday May 27 2018 15:02:41 EDT Ventricular Rate:  83 PR Interval:  186 QRS Duration: 92 QT Interval:  378 QTC Calculation: 444 R Axis:   42 Text Interpretation:  Normal sinus rhythm Cannot rule out Inferior infarct , age undetermined Abnormal ECG Normal sinus rhythm Confirmed by Zenovia Jarred [phone]) on 05/27/2018 7:17:16 PM   Radiology Ct Abdomen Pelvis Wo Contrast  Result Date: 05/27/2018 CLINICAL DATA:  Abdominal pain and fever.  Abscess suspected. Patient had an abdomen abscess drained earlier this month. EXAM: CT ABDOMEN AND PELVIS WITHOUT CONTRAST TECHNIQUE: Multidetector CT imaging of the abdomen and pelvis was performed following the standard protocol without IV contrast. COMPARISON:  05/06/2018 FINDINGS: Lower chest: Heart is normal size. Three-vessel coronary artery calcifications. Minor lung base subsegmental atelectasis. Persistent partly visualized right breast gynecomastia, which has been previously evaluated Hepatobiliary: Liver is unremarkable. There are dependent gallstones. No acute cholecystitis. No bile duct dilation. Pancreas: Small cystic lesion noted along the superior margin of the pancreatic neck and body measuring 2 cm. No other pancreatic abnormality. Spleen: Normal in size without focal abnormality. Adrenals/Urinary Tract: Bilateral renal cortical thinning. Posterior midpole right renal cyst. No other masses. No stones. No hydronephrosis. Normal ureters. Bladder wall is thickened anteriorly and superiorly adjacent to the drainage catheter, presumed reactive. No bladder mass or stone. Stomach/Bowel: Stomach and small bowel unremarkable. There are numerous colonic diverticula. Adjacent to the mid sigmoid colon, there is a pigtail catheter, which abuts the anterior superior bladder. The previous collection been completely evacuated. There is no evidence of residual abscess or of a new abscess. The adjacent sigmoid colon wall is without significant thickening. There is subtle inflammatory haziness adjacent to the decompressed prior collection and the mid sigmoid colon. No other colonic abnormality.  Normal appendix visualized. Vascular/Lymphatic: Aortic atherosclerosis. 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 Left common iliac artery is dilated to 2.5 cm. These findings are stable. No pathologically enlarged lymph nodes. Reproductive: Prostate is enlarged, but unchanged from the recent prior studies. Other: No  ascites. Musculoskeletal: No fracture or acute finding. No osteoblastic or osteolytic lesions. IMPRESSION: 1. No evidence of a residual pelvic abscess or of a new abscess. The pigtail catheter lies just above the anterior superior bladder and below the mid sigmoid colon, completely evacuating the previous collection. 2. There are mild inflammatory changes adjacent to the mid sigmoid colon, pigtail catheter and superior bladder. This is consistent with mild residual infection/inflammation. This has improved from the most recent prior CT. 3. Small cystic area in the anterior superior pancreatic neck and body measuring  2 cm. Recommend follow-up pancreatic protocol CT every 2 years x2 to establish stability. This follows consensus criteria for patients of this age. 4. 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 Electronically Signed   By: Lajean Manes M.D.   On: 05/27/2018 18:07   Dg Chest 2 View  Result Date: 05/27/2018 CLINICAL DATA:  Vomiting and diarrhea. Weakness. Low blood pressure. EXAM: CHEST - 2 VIEW COMPARISON:  No recent. FINDINGS: Mediastinum and hilar structures normal. Low lung volumes. Mild bibasilar atelectasis/infiltrates no pleural effusion or pneumothorax. Cardiomegaly with normal pulmonary vascularity. Degenerative change thoracic spine. IMPRESSION: 1. Mild bibasilar atelectasis/infiltrates. These are best identified on lateral view. 2.  Mild cardiomegaly with normal pulmonary vascularity. Electronically Signed   By: Marcello Moores  Register   On: 05/27/2018 17:02    Procedures Procedures (including critical care time)  Medications Ordered in ED Medications  piperacillin-tazobactam (ZOSYN) IVPB 3.375 g (has no administration in time range)  vancomycin (VANCOCIN) 2,000 mg in sodium chloride 0.9 % 500 mL IVPB (2,000 mg Intravenous New Bag/Given 05/27/18 1835)  vancomycin (VANCOCIN) IVPB 1000 mg/200 mL premix (has no  administration in time range)  sodium chloride 0.9 % bolus 2,000 mL (0 mLs Intravenous Stopped 05/27/18 1645)  piperacillin-tazobactam (ZOSYN) IVPB 3.375 g (0 g Intravenous Stopped 05/27/18 1823)     Initial Impression / Assessment and Plan / ED Course  I have reviewed the triage vital signs and the nursing notes.  Pertinent labs & imaging results that were available during my care of the patient were reviewed by me and considered in my medical decision making (see chart for details).     Pt presents to the ED after being referred by PCP for hypotension.  Family at bedside reports that patient has been weak over the past week with associated diarrhea in the past 2 weeks.  The report has had frequent falls.  Worsening altered mental status.  He has dementia at baseline.  Patient was hypotensive on arrival to the ED.  No tachycardia or fever was noted.  Lungs clear to auscultation bilaterally.  Heart regular rate and rhythm.  Mucous membranes are dry.  Patient had generalized abdominal pain.  Drain in place to the abdominal wall with brown drainage.  Hemoccult was negative.  Lab work showed no leukocytosis.  Hemoglobin was normal at baseline.  Mild hyperkalemia 5.2.  Bicarb was decreased at 17.  Patient had acute on chronic kidney injury with a creatinine of 3.07.  Baseline appears to be 2.2.  Mild decreased albumin.  Patient's liver enzymes were elevated.  Lactic was normal.  Normal troponin.  Blood cultures are pending.  Chest x-ray shows mild bibasilar atelectasis versus infiltrate.  Patient does report cough without any sputum production.  I doubt pneumonia at this time.  Patient given broad-spectrum antibiotics.  EKG shows normal sinus rhythm.  Given patient's abdominal pain will obtain CT imaging however cannot use contrast given creatinine.  CT returned that shows improvement in abscess of the abdominal wall.  Does show pancreatic mass recommends follow-up.  Chronic findings were noted.   This was discussed with family.   His blood pressure has stabilized with 2 L of fluid.  I suspect that patient weakness and hypotension is secondary to dehydration.  Patient lives at home with his son but does not use a walker or cane at baseline however has reduced over the past 2 days.  Patient will likely need PT and OT eval.  Dr. Myna Hidalgo with hospital medicine who agrees to admission will see patient in the ED and place admission orders.  He was also seen and evaluated by attending who is agreed with the above plan.    Final Clinical Impressions(s) / ED Diagnoses   Final diagnoses:  Dehydration  Acute renal failure superimposed on chronic kidney disease, unspecified CKD stage, unspecified acute renal failure type (HCC)  Diarrhea, unspecified type  Pancreatic mass  Hypotension, unspecified hypotension type    ED Discharge Orders    None       Aaron Edelman 05/27/18 1922    [place], Fredia Sorrow, MD 05/28/18 0003

## 2018-05-27 NOTE — ED Notes (Signed)
Patient transported to CT 

## 2018-05-27 NOTE — Progress Notes (Signed)
Subjective:    Patient ID: Robert Barnett, male    DOB: 03-31-1932, 84 y.o.   MRN: [id number]  HPI   Here with c/o weakness, diarrhea and urinary incontinence, seemed to start soon after start namenda bid, Namenda stopped after 1 wk per family after seeing Neurology but symptoms persist for the past 2 weeks.  Also assoc with general weakness, legs rubbery and several falls though not clear how many due to pt dementia and not saying anything.  Has bruising to the left lower back but he cant say what happened.  Pt has probably been a bit more confused with the symptoms.  No, apparent pain, but has noticeable diffuculty with walking, can only walk about 5-10 ft, high risk for fall.  Has occasional bowel incontinence normally, but now more freq and severe, also urinary incont unusual for him with night time wetting and bowel incontinence last few nights.  Diarrhea was loose but in the bed seems watery, may have have a tinge of redness - ? Blood.  Has intact drainage system for remote abd wall abscess, but not leaking but hard to say per family.  No recent antibx, no hx of c diff, no recent UTI of hx of such family is aware. Family has been pushing more fluids intentionally with his symptoms,  No apparent n/v.  Not sure about any wt loss.   Has been sleeping more today that is unusual.   Past Medical History:  Diagnosis Date  . Arthritis   . BPH (benign prostatic hyperplasia)   . Carotid artery occlusion   . Dementia   . Dysphagia   . Hyperlipidemia   . Hypertension   . Peripheral vascular disease ([place])   . Stroke [facility])    Right hemispheric CVA  . Umbilical hernia    Past Surgical History:  Procedure Laterality Date  . CAROTID ENDARTERECTOMY  10/06/2007   Right CEA by Dr. Amedeo Plenty  . IR CATHETER TUBE CHANGE  09/15/2017  . IR CATHETER TUBE CHANGE  12/03/2017  . IR CATHETER TUBE CHANGE  12/23/2017  . IR CATHETER TUBE CHANGE  05/07/2018  . IR GENERIC HISTORICAL  07/09/2016   IR RADIOLOGIST EVAL & MGMT  07/09/2016 Arne Cleveland, MD [facility] INTERV RAD  . IR RADIOLOGIST EVAL & MGMT  09/15/2017  . IR RADIOLOGIST EVAL & MGMT  05/06/2018  . PEG TUBE PLACEMENT  2008   after CVA    reports that he has quit smoking. He has never used smokeless tobacco. He reports that he does not drink alcohol or use drugs. family history is not on file. No Known Allergies Current Outpatient Medications on File Prior to Visit  Medication Sig Dispense Refill  . aspirin 81 MG tablet Take 81 mg by mouth daily.    . benazepril (LOTENSIN) 40 MG tablet TAKE 1 TABLET BY MOUTH EVERY DAY 90 tablet 0  . clopidogrel (PLAVIX) 75 MG tablet TAKE 1 TABLET (75 MG TOTAL) BY MOUTH DAILY. 90 tablet 0  . Colchicine 0.6 MG CAPS TAKE 1 CAPSULE BY MOUTH TWICE A DAY 60 capsule 3  . donepezil (ARICEPT) 10 MG tablet Take 1 tablet (10 mg total) by mouth at bedtime. 90 tablet 3  . levothyroxine (SYNTHROID, LEVOTHROID) 50 MCG tablet TAKE 1 TABLET (50 MCG TOTAL) BY MOUTH DAILY. 90 tablet 0  . memantine (NAMENDA) 10 MG tablet Take 1 tablet (10 mg total) by mouth 2 (two) times daily. 180 tablet 3  . simvastatin (ZOCOR) 20 MG tablet Take  20 mg by mouth daily.    Marland Kitchen triamcinolone cream (KENALOG) 0.5 % Apply 1 application topically 2 (two) times daily. 454 g 1   No current facility-administered medications on file prior to visit.    ROS: Unable due to dementia     Objective:   Physical Exam BP 106/64   Pulse 61   Temp 98.1 F (36.7 C) (Oral)   Ht 5\' 11"  (1.803 m)   SpO2 97%   BMI 29.71 kg/m  VS noted,  Constitutional: Pt appears in NAD HENT: Head: NCAT.  Right Ear: External ear normal.  Left Ear: External ear normal.  Eyes: . Pupils are equal, round, and reactive to light. Conjunctivae and EOM are normal Nose: without d/c or deformity Neck: Neck supple. Gross normal ROM Cardiovascular: Normal rate and regular rhythm.   Pulmonary/Chest: Effort normal and breath sounds without rales or wheezing.  Abd:  Soft, NT, ND, + BS, no  organomegaly with only minor tender at the previous abscess site Neurological: Pt is alert. At baseline orientation, motor grossly intact Skin: Skin is warm. No rashes, other new lesions, no LE edema Psychiatric: Pt behavior is normal without agitation  No other exam findings     Assessment & Plan:

## 2018-05-27 NOTE — ED Notes (Signed)
Pt family left, pt bed alarm was set for safety.

## 2018-05-27 NOTE — Patient Instructions (Signed)
Please go to Emergency Room for the incontinence, low blood pressure and high risk of falls  Do not take the blood pressure pill for now  Please continue all other medications as before, and refills have been done if requested.  Please have the pharmacy call with any other refills you may need.  Please keep your appointments with your specialists as you may have planned

## 2018-05-27 NOTE — Progress Notes (Signed)
Pharmacy Antibiotic Note  Robert Barnett is a 84 y.o. male admitted on 05/27/2018 with possible sepsis. Pharmacy has been consulted for vancomycin and Zosyn dosing. WBC WNL, currently AF. Scr 3.07 (BL ~2.2), estimated CrCl ~25 mL/min.  Plan: Vancomycin 2000mg  IV x1, followed by vancomycin 1000mg  IV q24h Zosyn 3.375g IV q8h F/u C&S, clinical status, renal function, de-escalation, LOT, vancomycin levels as indicated  Temp (24hrs), Avg:97.9 F (36.6 C), Min:97.7 F (36.5 C), Max:98.1 F (36.7 C)  Recent Labs  Lab 05/27/18 1515 05/27/18 1527  WBC 5.9  --   LATICACIDVEN  --  1.47    CrCl cannot be calculated (Patient's most recent lab result is older than the maximum 21 days allowed.).    No Known Allergies  Antimicrobials this admission: Vanc 5/30 >>  Zosyn 5/30 >>   Dose adjustments this admission: None  Microbiology results: 5/30 BCx: pending  Thank you for allowing pharmacy to be a part of this patient's care.  Mila Merry Gerarda Fraction, PharmD PGY1 Pharmacy Resident Pager: [phone] 05/27/2018 4:21 PM

## 2018-05-28 ENCOUNTER — Encounter (HOSPITAL_COMMUNITY): Payer: Self-pay | Admitting: *Deleted

## 2018-05-28 ENCOUNTER — Other Ambulatory Visit: Payer: Self-pay

## 2018-05-28 DIAGNOSIS — R197 Diarrhea, unspecified: Secondary | ICD-10-CM

## 2018-05-28 DIAGNOSIS — I959 Hypotension, unspecified: Secondary | ICD-10-CM

## 2018-05-28 DIAGNOSIS — E861 Hypovolemia: Secondary | ICD-10-CM

## 2018-05-28 DIAGNOSIS — N189 Chronic kidney disease, unspecified: Secondary | ICD-10-CM

## 2018-05-28 DIAGNOSIS — N179 Acute kidney failure, unspecified: Secondary | ICD-10-CM

## 2018-05-28 DIAGNOSIS — I9589 Other hypotension: Secondary | ICD-10-CM

## 2018-05-28 DIAGNOSIS — E860 Dehydration: Principal | ICD-10-CM

## 2018-05-28 LAB — GASTROINTESTINAL PANEL BY PCR, STOOL (REPLACES STOOL CULTURE)

## 2018-05-28 LAB — BLOOD CULTURE ID PANEL (REFLEXED)
Acinetobacter baumannii: NOT DETECTED
CANDIDA ALBICANS: NOT DETECTED
CANDIDA PARAPSILOSIS: NOT DETECTED
CANDIDA TROPICALIS: NOT DETECTED
Candida glabrata: NOT DETECTED
Candida krusei: NOT DETECTED
ENTEROBACTERIACEAE SPECIES: NOT DETECTED
Enterobacter cloacae complex: NOT DETECTED
Enterococcus species: NOT DETECTED
Escherichia coli: NOT DETECTED
Haemophilus influenzae: NOT DETECTED
KLEBSIELLA OXYTOCA: NOT DETECTED
KLEBSIELLA PNEUMONIAE: NOT DETECTED
Listeria monocytogenes: NOT DETECTED
METHICILLIN RESISTANCE: NOT DETECTED
Neisseria meningitidis: NOT DETECTED
PROTEUS SPECIES: NOT DETECTED
Pseudomonas aeruginosa: NOT DETECTED
SERRATIA MARCESCENS: NOT DETECTED
STAPHYLOCOCCUS SPECIES: DETECTED — AB
STREPTOCOCCUS PYOGENES: NOT DETECTED
Staphylococcus aureus (BCID): NOT DETECTED
Streptococcus agalactiae: NOT DETECTED
Streptococcus pneumoniae: NOT DETECTED
Streptococcus species: NOT DETECTED

## 2018-05-28 LAB — COMPREHENSIVE METABOLIC PANEL
ALBUMIN: 2.6 g/dL — AB (ref 3.5–5.0)
ALK PHOS: 73 U/L (ref 38–126)
ALT: 92 U/L — AB (ref 17–63)
AST: 173 U/L — ABNORMAL HIGH (ref 15–41)
Anion gap: 10 (ref 5–15)
BUN: 36 mg/dL — AB (ref 6–20)
CALCIUM: 8.4 mg/dL — AB (ref 8.9–10.3)
CO2: 19 mmol/L — AB (ref 22–32)
CREATININE: 2.63 mg/dL — AB (ref 0.61–1.24)
Chloride: 113 mmol/L — ABNORMAL HIGH (ref 101–111)
GFR calc Af Amer: 24 mL/min — ABNORMAL LOW (ref >60)
GFR calc non Af Amer: 21 mL/min — ABNORMAL LOW (ref >60)
GLUCOSE: 67 mg/dL (ref 65–99)
Potassium: 4.6 mmol/L (ref 3.5–5.1)
Sodium: 142 mmol/L (ref 135–145)
Total Bilirubin: 0.7 mg/dL (ref 0.3–1.2)
Total Protein: 6.3 g/dL — ABNORMAL LOW (ref 6.5–8.1)

## 2018-05-28 LAB — CBC WITH DIFFERENTIAL/PLATELET
ABS IMMATURE GRANULOCYTES: 0 10*3/uL (ref 0.0–0.1)
Basophils Absolute: 0.1 10*3/uL (ref 0.0–0.1)
Basophils Relative: 2 %
Eosinophils Absolute: 0.1 10*3/uL (ref 0.0–0.7)
Eosinophils Relative: 1 %
HCT: 33.9 % — ABNORMAL LOW (ref 39.0–52.0)
HEMOGLOBIN: 10.4 g/dL — AB (ref 13.0–17.0)
Immature Granulocytes: 1 %
LYMPHS ABS: 1 10*3/uL (ref 0.7–4.0)
LYMPHS PCT: 18 %
MCH: 31.4 pg (ref 26.0–34.0)
MCHC: 30.7 g/dL (ref 30.0–36.0)
MCV: 102.4 fL — ABNORMAL HIGH (ref 78.0–100.0)
MONO ABS: 1 10*3/uL (ref 0.1–1.0)
Monocytes Relative: 17 %
NEUTROS ABS: 3.4 10*3/uL (ref 1.7–7.7)
Neutrophils Relative %: 61 %
Platelets: 162 10*3/uL (ref 150–400)
RBC: 3.31 MIL/uL — AB (ref 4.22–5.81)
RDW: 15.7 % — ABNORMAL HIGH (ref 11.5–15.5)
WBC: 5.5 10*3/uL (ref 4.0–10.5)

## 2018-05-28 LAB — C DIFFICILE QUICK SCREEN W PCR REFLEX
C DIFFICILE (CDIFF) INTERP: NOT DETECTED
C DIFFICLE (CDIFF) ANTIGEN: NEGATIVE
C Diff toxin: NEGATIVE

## 2018-05-28 MED ORDER — SODIUM CHLORIDE 0.9 % IV SOLN
INTRAVENOUS | Status: DC
  Administered 2018-05-28: 12:00:00 via INTRAVENOUS

## 2018-05-28 MED ORDER — SODIUM CHLORIDE 0.9 % IV SOLN
INTRAVENOUS | Status: DC
  Administered 2018-05-28: 23:00:00 via INTRAVENOUS

## 2018-05-28 NOTE — Progress Notes (Signed)
PHARMACY - PHYSICIAN COMMUNICATION CRITICAL VALUE ALERT - BLOOD CULTURE IDENTIFICATION (BCID)  Robert Barnett is an 84 y.o. male who presented to [facility] on 05/27/2018 with a chief complaint of diarrhea and general weakness  Assessment:  Admitted with sepsis although no fevers and WBC wnl.CXR showed mild bibasilar atelectasis / infiltrates. Started on abx for possible PNA. 1/4 blood cx positive with staph species. Is having diarrhea but C. Diff negative.  Name of physician (or Provider) Contacted: A. Hongalgi  Current antibiotics: Azithromycin  Changes to prescribed antibiotics recommended:  No changes needed F/U abx de-escalation for PNA vs monitor off abx soon  Results for orders placed or performed during the hospital encounter of 05/27/18  Blood Culture ID Panel (Reflexed) (Collected: 05/27/2018  4:11 PM)  Result Value Ref Range   Enterococcus species NOT DETECTED NOT DETECTED   Listeria monocytogenes NOT DETECTED NOT DETECTED   Staphylococcus species DETECTED (A) NOT DETECTED   Staphylococcus aureus NOT DETECTED NOT DETECTED   Methicillin resistance NOT DETECTED NOT DETECTED   Streptococcus species NOT DETECTED NOT DETECTED   Streptococcus agalactiae NOT DETECTED NOT DETECTED   Streptococcus pneumoniae NOT DETECTED NOT DETECTED   Streptococcus pyogenes NOT DETECTED NOT DETECTED   Acinetobacter baumannii NOT DETECTED NOT DETECTED   Enterobacteriaceae species NOT DETECTED NOT DETECTED   Enterobacter cloacae complex NOT DETECTED NOT DETECTED   Escherichia coli NOT DETECTED NOT DETECTED   Klebsiella oxytoca NOT DETECTED NOT DETECTED   Klebsiella pneumoniae NOT DETECTED NOT DETECTED   Proteus species NOT DETECTED NOT DETECTED   Serratia marcescens NOT DETECTED NOT DETECTED   Haemophilus influenzae NOT DETECTED NOT DETECTED   Neisseria meningitidis NOT DETECTED NOT DETECTED   Pseudomonas aeruginosa NOT DETECTED NOT DETECTED   Candida albicans NOT DETECTED NOT DETECTED   Candida glabrata NOT DETECTED NOT DETECTED   Candida krusei NOT DETECTED NOT DETECTED   Candida parapsilosis NOT DETECTED NOT DETECTED   Candida tropicalis NOT DETECTED NOT DETECTED    [name] J 05/28/2018  9:15 AM

## 2018-05-28 NOTE — ED Notes (Signed)
Pt upset refuses to take meds  Pt states he is sick of [place] all and dose not want to be here, pt repositioned in bed and linens changed

## 2018-05-28 NOTE — Progress Notes (Signed)
New Admission Note:  Arrival Method: Bed Mental Orientation: Alert and oriented x 3-4 Telemetry: Box 05  Assessment: Completed Skin: warm and dry. Refused skin check. Pt upset from previous health professionals.  IV: NSL's  Pain: Denies  Tubes: Long term percutaneous drain LLQ Safety Measures: Safety Fall Prevention Plan initiated.  Admission: Completed 5 M  Orientation: Patient has been orientated to the room, unit and the staff. Family: None   Orders have been reviewed and implemented. Will continue to monitor the patient. Call light has been placed within reach and bed alarm has been activated.   Sima Matas BSN, RN  Phone Number: [phone]

## 2018-05-28 NOTE — Care Management Note (Addendum)
Case Management Note  Patient Details  Name: Robert Barnett MRN: [id number] Date of Birth: 1932-10-01  Subjective/Objective:      Admitted with sepsis/possible pneumonia.              Action/Plan: CXR showed mild bibasilar atelectasis / infiltrates. Started on abx for possible PNA. 1/4 blood cx positive with staph species. Is having diarrhea but C. Diff negative.  Prior to admission patient lived at home alone.  At discharge patient plans to return to same living situation.  At discharge patient has transportation home.  In to speak with patient and daughter.  Offered choice for outpatient rehab, selected [facility]; internal referral sent to [facility], explained that they will call to set up appointment. Discussed request for home health services: RN, nurse aide and offered choice of provider.  [place] agency list of providers NCM will continue to follow for discharge transition needs.  Expected Discharge Date:  05/31/18               Expected Discharge Plan:  OP Rehab  In-House Referral:  Clinical Social Work  Discharge planning Services  CM Consult  Post Acute Care Choice:  Home Health Choice offered to:  Patient, Adult Children  DME Arranged:   3-in-1, wheel chair DME Agency:   [place]:  Refused SNF, RN, Nurse's Aide [place] Agency:     Status of Service:  In process, will continue to follow  Kristen Cardinal, RN 05/29/2018, 12:17 PM

## 2018-05-28 NOTE — Progress Notes (Addendum)
PROGRESS NOTE   Robert Barnett  ONG:[id number]    DOB: [birth year]    DOA: 05/27/2018  PCP: Janith Lima, MD   I have briefly reviewed patients previous medical records in [facility].  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  Reportedly started having diarrhea about 3 weeks ago, continued despite stopping newly started Namenda, no abdominal pain, nausea vomiting, fever or chills, no recent antibiotics, travel or sick contacts.  Admitted for diarrhea, hypotension, dehydration, acute on chronic kidney disease.  Improving.   Assessment & Plan:   Principal Problem:   Hypotension due to hypovolemia Active Problems:   Diarrhea with dehydration   History of completed stroke   Hypothyroidism   Moderate dementia without behavioral disturbance   Essential hypertension   Acute renal failure superimposed on stage 4 chronic kidney disease (HCC)   Diarrhea: Unclear etiology.  C. difficile testing negative.  Persisted despite stopping newly started Namenda.  Follow GI panel PCR.  Holding colchicine.  Discontinue empirically started azithromycin.  Improving.  Dehydration: Secondary to GI losses and poor oral intake.  Improving.  Continue IV fluids for additional day.  Hypotension: Secondary to dehydration.  Blood pressure 74/45 on arrival.  Responded to IV fluids.  Hold antihypertensives.  Continue additional day of IV fluids.  Monitor closely.  Acute on stage IV chronic kidney disease: Baseline creatinine approximately 2.2.  Presented with creatinine of 3.07 on admission.  Likely prerenal.  Hydrated with IV fluids.  Creatinine has improved to 2.63.  Continue  IV fluids and follow BMP in a.m.  Dementia: Without behavioral disturbances.  Continue Aricept.  Discontinue Namenda.  History of CVA: No new focal neurological deficits.  Continue aspirin, Plavix and statins.  Chronic abdominal abscess: CT abdomen and pelvis shows resolution of abscess and no new abscesses.  As per daughter, patient has had abdominal drain for 7 years.  Hypothyroid: Continue Synthroid.  Pancreatic cyst: Noted incidentally on CT.  Follow-up imaging recommended.  Macrocytic anemia: Could be dilutional.  No bleeding reported.  Follow CBCs.  1 of 2 blood cultures positive for Staphylococcus species: Likely contaminant.  Follow final culture results.  Abnormal LFTs: Unclear etiology.  Stable.   DVT prophylaxis: Subcutaneous heparin Code Status: Full Family Communication: Discussed in detail with patient's daughter at bedside.  Updated care and answered questions. Disposition: Determined pending clinical improvement and PT evaluation.  May need SNF.   Consultants:  None.  Procedures:  None  Antimicrobials:  Azithromycin-discontinue   Subjective: Patient pleasantly confused and unable to provide much history.  Overall states that he feels better.  Denies complaints.  No nausea, vomiting or abdominal pain reported.  As per nursing, stools are soft, not watery.  ROS: As above.  Objective:  Vitals:   05/28/18 0531 05/28/18 0701 05/28/18 0945 05/28/18 1529  BP: (!) 95/47  (!) 102/57 (!) 125/59  Pulse: 63  (!) 53 (!) 52  Resp: 20  18 18   Temp: 97.9 F (36.6 C)  97.8 F (36.6 C) 97.9 F (36.6 C)  TempSrc: Oral  Oral Oral  SpO2: 100%  100% 99%  Weight:  96.6 kg (212 lb 15.4 oz)    Height:  5\' 11"  (1.803 m)  Examination:  General exam: Pleasant elderly male, moderately built and nourished, lying comfortably propped up in bed.  Oral mucosa with borderline hydration. Respiratory system: Clear to auscultation. Respiratory effort normal. Cardiovascular  system: S1 & S2 heard, RRR. No JVD, murmurs, rubs, gallops or clicks. No pedal edema.  Telemetry personally reviewed: Sinus bradycardia in the 50s. Gastrointestinal system: Abdomen is nondistended, soft and nontender. No organomegaly or masses felt. Normal bowel sounds heard.  Abdominal drain site without acute findings. Central nervous system: Alert and oriented to person, partly to place but not to time. No focal neurological deficits. Extremities: Symmetric 5 x 5 power. Skin: No rashes, lesions or ulcers Psychiatry: Judgement and insight impaired. Mood & affect appropriate.     Data Reviewed: I have personally reviewed following labs and imaging studies  CBC: Recent Labs  Lab 05/27/18 1515 05/28/18 0734  WBC 5.9 5.5  NEUTROABS 4.0 3.4  HGB 13.2 10.4*  HCT 41.7 33.9*  MCV 98.6 102.4*  PLT 209 254   Basic Metabolic Panel: Recent Labs  Lab 05/27/18 1515 05/28/18 0734  NA 142 142  K 5.2* 4.6  CL 117* 113*  CO2 17* 19*  GLUCOSE 120* 67  BUN 44* 36*  CREATININE 3.07* 2.63*  CALCIUM 8.9 8.4*   Liver Function Tests: Recent Labs  Lab 05/27/18 1515 05/28/18 0734  AST 171* 173*  ALT 92* 92*  ALKPHOS 82 73  BILITOT 0.8 0.7  PROT 7.3 6.3*  ALBUMIN 2.9* 2.6*     Recent Results (from the past 240 hour(s))  Culture, blood (routine x 2)     Status: None (Preliminary result)   Collection Time: 05/27/18  4:11 PM  Result Value Ref Range Status   Specimen Description BLOOD RIGHT ANTECUBITAL  Final   Special Requests   Final    BOTTLES DRAWN AEROBIC AND ANAEROBIC Blood Culture adequate volume   Culture  Setup Time   Final    GRAM POSITIVE COCCI IN CLUSTERS AEROBIC BOTTLE ONLY CRITICAL RESULT CALLED TO, READ BACK BY AND VERIFIED WITH: N BATCHELDER,PHARMD AT 0900 05/28/18 BY L BENFIELD Performed at [facility], [address] [place], [place] [address]    Culture GRAM POSITIVE COCCI  Final   Report Status PENDING  Incomplete  Blood Culture ID Panel (Reflexed)      Status: Abnormal   Collection Time: 05/27/18  4:11 PM  Result Value Ref Range Status   Enterococcus species NOT DETECTED NOT DETECTED Final   Listeria monocytogenes NOT DETECTED NOT DETECTED Final   Staphylococcus species DETECTED (A) NOT DETECTED Final    Comment: Methicillin (oxacillin) susceptible coagulase negative staphylococcus. Possible blood culture contaminant (unless isolated from more than one blood culture draw or clinical case suggests pathogenicity). No antibiotic treatment is indicated for blood  culture contaminants. CRITICAL RESULT CALLED TO, READ BACK BY AND VERIFIED WITH: N BATCHELDER,PHARMD AT 0900 05/28/18 BY L BENFIELD    Staphylococcus aureus NOT DETECTED NOT DETECTED Final   Methicillin resistance NOT DETECTED NOT DETECTED Final   Streptococcus species NOT DETECTED NOT DETECTED Final   Streptococcus agalactiae NOT DETECTED NOT DETECTED Final   Streptococcus pneumoniae NOT DETECTED NOT DETECTED Final   Streptococcus pyogenes NOT DETECTED NOT DETECTED Final   Acinetobacter baumannii NOT DETECTED NOT DETECTED Final   Enterobacteriaceae species NOT DETECTED NOT DETECTED Final   Enterobacter cloacae complex NOT DETECTED NOT DETECTED Final   Escherichia coli NOT DETECTED NOT DETECTED Final   Klebsiella oxytoca NOT DETECTED NOT DETECTED Final  Klebsiella pneumoniae NOT DETECTED NOT DETECTED Final   Proteus species NOT DETECTED NOT DETECTED Final   Serratia marcescens NOT DETECTED NOT DETECTED Final   Haemophilus influenzae NOT DETECTED NOT DETECTED Final   Neisseria meningitidis NOT DETECTED NOT DETECTED Final   Pseudomonas aeruginosa NOT DETECTED NOT DETECTED Final   Candida albicans NOT DETECTED NOT DETECTED Final   Candida glabrata NOT DETECTED NOT DETECTED Final   Candida krusei NOT DETECTED NOT DETECTED Final   Candida parapsilosis NOT DETECTED NOT DETECTED Final   Candida tropicalis NOT DETECTED NOT DETECTED Final    Comment: Performed at [facility], [place] [address] [place], [place] [address]  Culture, blood (routine x 2)     Status: None (Preliminary result)   Collection Time: 05/27/18  4:17 PM  Result Value Ref Range Status   Specimen Description BLOOD RIGHT HAND  Final   Special Requests   Final    BOTTLES DRAWN AEROBIC AND ANAEROBIC Blood Culture adequate volume   Culture   Final    NO GROWTH < 24 HOURS Performed at [facility], [place] [address], [place], [place] [address]    Report Status PENDING  Incomplete  C difficile quick scan w PCR reflex     Status: None   Collection Time: 05/28/18  5:38 AM  Result Value Ref Range Status   C Diff antigen NEGATIVE NEGATIVE Final   C Diff toxin NEGATIVE NEGATIVE Final   C Diff interpretation No C. difficile detected.  Final    Comment: Performed at [facility], [place] [address] [place], [place] [address]         Radiology Studies: Ct Abdomen Pelvis Wo Contrast  Result Date: 05/27/2018 CLINICAL DATA:  Abdominal pain and fever. Abscess suspected. Patient had an abdomen abscess drained earlier this month. EXAM: CT ABDOMEN AND PELVIS WITHOUT CONTRAST TECHNIQUE: Multidetector CT imaging of the abdomen and pelvis was performed following the standard protocol without IV contrast. COMPARISON:  05/06/2018 FINDINGS: Lower chest: Heart is normal size. Three-vessel coronary artery calcifications. Minor lung base subsegmental atelectasis. Persistent partly visualized right breast gynecomastia, which has been previously evaluated Hepatobiliary: Liver is unremarkable. There are dependent gallstones. No acute cholecystitis. No bile duct dilation. Pancreas: Small cystic lesion noted along the superior margin of the pancreatic neck and body measuring 2 cm. No other pancreatic abnormality. Spleen: Normal in size without focal abnormality. Adrenals/Urinary Tract: Bilateral renal cortical thinning. Posterior midpole right renal cyst. No other masses. No stones. No hydronephrosis. Normal ureters.  Bladder wall is thickened anteriorly and superiorly adjacent to the drainage catheter, presumed reactive. No bladder mass or stone. Stomach/Bowel: Stomach and small bowel unremarkable. There are numerous colonic diverticula. Adjacent to the mid sigmoid colon, there is a pigtail catheter, which abuts the anterior superior bladder. The previous collection been completely evacuated. There is no evidence of residual abscess or of a new abscess. The adjacent sigmoid colon wall is without significant thickening. There is subtle inflammatory haziness adjacent to the decompressed prior collection and the mid sigmoid colon. No other colonic abnormality.  Normal appendix visualized. Vascular/Lymphatic: Aortic atherosclerosis. 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 Left common iliac artery is dilated to 2.5 cm. These findings are stable. No pathologically enlarged lymph nodes. Reproductive: Prostate is enlarged, but unchanged from the recent prior studies. Other: No ascites. Musculoskeletal: No fracture or acute finding. No osteoblastic or osteolytic lesions. IMPRESSION: 1. No evidence of a residual pelvic abscess or of a  new abscess. The pigtail catheter lies just above the anterior superior bladder and below the mid sigmoid colon, completely evacuating the previous collection. 2. There are mild inflammatory changes adjacent to the mid sigmoid colon, pigtail catheter and superior bladder. This is consistent with mild residual infection/inflammation. This has improved from the most recent prior CT. 3. Small cystic area in the anterior superior pancreatic neck and body measuring 2 cm. Recommend follow-up pancreatic protocol CT every 2 years x2 to establish stability. This follows consensus criteria for patients of this age. 4. 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 Electronically Signed   By: Lajean Manes M.D.   On: 05/27/2018 18:07     Dg Chest 2 View  Result Date: 05/27/2018 CLINICAL DATA:  Vomiting and diarrhea. Weakness. Low blood pressure. EXAM: CHEST - 2 VIEW COMPARISON:  No recent. FINDINGS: Mediastinum and hilar structures normal. Low lung volumes. Mild bibasilar atelectasis/infiltrates no pleural effusion or pneumothorax. Cardiomegaly with normal pulmonary vascularity. Degenerative change thoracic spine. IMPRESSION: 1. Mild bibasilar atelectasis/infiltrates. These are best identified on lateral view. 2.  Mild cardiomegaly with normal pulmonary vascularity. Electronically Signed   By: [place]   On: 05/27/2018 17:02        Scheduled Meds: . aspirin EC  81 mg Oral Daily  . clopidogrel  75 mg Oral Daily  . donepezil  10 mg Oral QHS  . heparin  5,000 Units Subcutaneous Q8H  . levothyroxine  50 mcg Oral QAC breakfast  . simvastatin  20 mg Oral q1800  . sodium chloride flush  3 mL Intravenous Q12H   Continuous Infusions: . sodium chloride 100 mL/hr at 05/28/18 1206  . azithromycin Stopped (05/28/18 0141)     LOS: 1 day     Vernell Leep, MD, FACP, [facility]. [facility] Pager [phone]  If 7PM-7AM, please contact night-coverage [email or web address] Password [facility] 05/28/2018, 5:37 PM

## 2018-05-29 DIAGNOSIS — E875 Hyperkalemia: Secondary | ICD-10-CM

## 2018-05-29 DIAGNOSIS — E872 Acidosis: Secondary | ICD-10-CM

## 2018-05-29 LAB — COMPREHENSIVE METABOLIC PANEL
ALBUMIN: 2.6 g/dL — AB (ref 3.5–5.0)
ALT: 83 U/L — ABNORMAL HIGH (ref 17–63)
ANION GAP: 8 (ref 5–15)
AST: 155 U/L — ABNORMAL HIGH (ref 15–41)
Alkaline Phosphatase: 70 U/L (ref 38–126)
BUN: 29 mg/dL — ABNORMAL HIGH (ref 6–20)
CHLORIDE: 124 mmol/L — AB (ref 101–111)
CO2: 15 mmol/L — AB (ref 22–32)
Calcium: 8.6 mg/dL — ABNORMAL LOW (ref 8.9–10.3)
Creatinine, Ser: 2.37 mg/dL — ABNORMAL HIGH (ref 0.61–1.24)
GFR calc non Af Amer: 24 mL/min — ABNORMAL LOW (ref >60)
GFR, EST AFRICAN AMERICAN: 28 mL/min — AB (ref >60)
Glucose, Bld: 93 mg/dL (ref 65–99)
POTASSIUM: 5.3 mmol/L — AB (ref 3.5–5.1)
SODIUM: 147 mmol/L — AB (ref 135–145)
Total Bilirubin: 0.7 mg/dL (ref 0.3–1.2)
Total Protein: 6 g/dL — ABNORMAL LOW (ref 6.5–8.1)

## 2018-05-29 LAB — CBC
HEMATOCRIT: 33.8 % — AB (ref 39.0–52.0)
Hemoglobin: 10.4 g/dL — ABNORMAL LOW (ref 13.0–17.0)
MCH: 31.4 pg (ref 26.0–34.0)
MCHC: 30.8 g/dL (ref 30.0–36.0)
MCV: 102.1 fL — ABNORMAL HIGH (ref 78.0–100.0)
Platelets: 133 10*3/uL — ABNORMAL LOW (ref 150–400)
RBC: 3.31 MIL/uL — ABNORMAL LOW (ref 4.22–5.81)
RDW: 15.6 % — AB (ref 11.5–15.5)
WBC: 4.2 10*3/uL (ref 4.0–10.5)

## 2018-05-29 LAB — GLUCOSE, CAPILLARY: GLUCOSE-CAPILLARY: 70 mg/dL (ref 65–99)

## 2018-05-29 MED ORDER — SODIUM BICARBONATE 8.4 % IV SOLN: INTRAVENOUS | Status: DC

## 2018-05-29 MED ORDER — DEXTROSE 5 % IV SOLN
INTRAVENOUS | Status: AC
  Administered 2018-05-29 – 2018-05-30 (×2): via INTRAVENOUS

## 2018-05-29 NOTE — Evaluation (Addendum)
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  Reportedly started having diarrhea about 3 weeks ago, continued despite stopping newly started Namenda, no abdominal pain, nausea vomiting, fever or chills, no recent antibiotics, travel or sick contacts.  Admitted for diarrhea, hypotension, dehydration, acute on chronic kidney disease.  Improving.     Clinical Impression  Pt admitted with above diagnosis. Pt currently with functional limitations due to the deficits listed below (see PT Problem List). PTA, pt living at home with son with PRN family support from his 5 children. Able to ambulate in community with supervision to church and breakfast without AD. Patient has had several falls over last week with decrease in strength. Daughter providing history as patient is AO to person only. Upon eval pt presents with cognitive, strength, and balance deficits. Ambulated 57' with min A for stability. Presents as a falls risk. Discussed with family that if left alone during the day he may likely fall, recommending 24/7 supervision. Daughter in process of phoning siblings to discuss whether they can adjust their schedules to be with the patient, unclear at this time. If 24/7 support, then home will be my recommendation  Pt will benefit from skilled PT to increase their independence and safety with mobility to allow discharge to the venue listed below.    Update: Patients family confirmed 24/7 support and are requesting OP PT vs HHPT as they have had  failed success with HHPT in the past.      Follow Up Recommendations SNF;Supervision/Assistance - 24 hour;Other (comment)(If family decides to have 24/7 support at home, then OP PT))    Equipment Recommendations  3in1 (PT);Other (comment)(If going home)    Conservation officer, nature   Recommendations for Other Services OT consult     Precautions / Restrictions Precautions Precautions: Fall Precaution Comments: AOx1 person only      Mobility  Bed Mobility               General bed mobility comments: In chair at entry  Transfers Overall transfer level: Needs assistance Equipment used: Rolling walker (2 wheeled) Transfers: Sit to/from Stand Sit to Stand: Min assist         General transfer comment: Min A to provide stability while standing inside RW  Ambulation/Gait Ambulation/Gait assistance: Min assist Ambulation Distance (Feet): 40 Feet Assistive device: Rolling walker (2 wheeled) Gait Pattern/deviations: Step-to pattern;Staggering right;Staggering left Gait velocity: decreased   General Gait Details: Patient with usnteady gait, cues for safety with RW, requires hands on physical support to provide stability.   Stairs            Wheelchair Mobility    Modified Rankin (Stroke Patients Only)       Balance Overall balance assessment: Needs assistance   Sitting balance-Leahy Scale: Fair       Standing balance-Leahy Scale: Poor                               Pertinent Vitals/Pain Pain Assessment: No/denies pain    Home Living Family/patient expects to be  discharged to:: Skilled nursing facility Living Arrangements: Children(with son)               Additional Comments: Has walker,  has w/c.     Prior Function           Comments: Patient lives with son, has 4 duagthers that help to take care of them. assists with ADLs, but has been able to ambulate with supervision from family to go to church or out to breakfast, as of week he has  not been able  to walk, falling 5x at home.      Hand Dominance        Extremity/Trunk Assessment   Upper Extremity Assessment Upper Extremity Assessment: Defer to OT evaluation;Overall [facility] for tasks assessed    Lower Extremity Assessment Lower Extremity Assessment: (BLE 3+/5 strength upon eval )       Communication      Cognition Arousal/Alertness: Awake/alert Behavior During Therapy: WFL for tasks assessed/performed;Flat affect Overall Cognitive Status: History of cognitive impairments - at baseline                                 General Comments: Patient with advanced dementia, AOx Person only. Pleasenet and conversational, belives he is stil at his home. Follows one step commands 70% of time.        General Comments      Exercises     Assessment/Plan    PT Assessment Patient needs continued PT services  PT Problem List Decreased strength;Decreased activity tolerance;Decreased balance;Decreased mobility;Decreased coordination;Decreased cognition;Decreased safety awareness;Decreased knowledge of use of DME       PT Treatment Interventions DME instruction;Gait training;Stair training;Functional mobility training;Therapeutic activities;Therapeutic exercise;Balance training    PT Goals (Current goals can be found in the Care Plan section)  Acute Rehab PT Goals Patient Stated Goal: non stated PT Goal Formulation: With patient/family Time For Goal Achievement: 06/12/18 Potential to Achieve Goals: Good    Frequency Min 2X/week   Barriers to discharge        Co-evaluation               AM-PAC PT "6 Clicks" Daily Activity  Outcome Measure Difficulty turning over in bed (including adjusting bedclothes, sheets and blankets)?: A Little Difficulty moving from lying on back to sitting on the side of the bed? : A Little Difficulty sitting down on and standing up from a chair with arms (e.g., wheelchair, bedside commode, etc,.)?: A Little Help  needed moving to and from a bed to chair (including a wheelchair)?: A Little Help needed walking in hospital room?: A Little Help needed climbing 3-5 steps with a railing? : A Lot 6 Click Score: 17    End of Session Equipment Utilized During Treatment: Gait belt Activity Tolerance: Patient tolerated treatment well Patient left: in chair;with family/visitor present;with call bell/phone within reach Nurse Communication: Mobility status PT Visit Diagnosis: Unsteadiness on feet (R26.81);Other (comment)(Cognitive deficits )    Time: 0981-1914 PT Time Calculation (min) (ACUTE ONLY): 40 min   Charges:   PT Evaluation $PT Eval Low Complexity: 1 Low PT Treatments $Gait Training: 8-22 mins   PT G Codes:        Reinaldo Berber, PT, DPT Acute Rehab Services Pager: [phone]    Reinaldo Berber 05/29/2018, 9:43 AM

## 2018-05-29 NOTE — Evaluation (Signed)
Occupational Therapy Evaluation Patient Details Name: Robert Barnett MRN: [id number] DOB: 01-Jun-1932 Today's Date: 05/29/2018    History of Present Illness 84 year old male, lives with his son, independent with his activities until 5 days prior to admission, PMH of HTN, stage IV chronic kidney disease, dementia, chronic abdominal abscess with percutaneous chronic drain, CVA, hypothyroid, presented to the ED at the direction of his PCP for evaluation of diarrhea, generalized weakness, hypotension and frequent falls.  Reportedly started having diarrhea about 3 weeks ago, continued despite stopping newly started Namenda, no abdominal pain, nausea vomiting, fever or chills, no recent antibiotics, travel or sick contacts.  Admitted for diarrhea, hypotension, dehydration, acute on chronic kidney disease.  Improving.   Clinical Impression   Patient evaluated by Occupational Therapy with no further acute OT needs identified. All education has been completed and the patient has no further questions. Per family, pt is at baseline for ADLs.  All education completed.  See below for any follow-up Occupational Therapy or equipment needs. OT is signing off. Thank you for this referral.      Follow Up Recommendations  No OT follow up;Supervision/Assistance - 24 hour    Equipment Recommendations  None recommended by OT    Recommendations for Other Services       Precautions / Restrictions Precautions Precautions: Fall      Mobility Bed Mobility               General bed mobility comments: Pt refused to move to EOB   Transfers                 General transfer comment: Pt refused to move OOB     Balance                                           ADL either performed or assessed with clinical judgement   ADL Overall ADL's : Needs assistance/impaired Eating/Feeding: Moderate assistance;Bed level   Grooming: Wash/dry hands;Wash/dry face;Maximal assistance                                  General ADL Comments: Pt refused participation in further ADLs      Vision         Perception     Praxis      Pertinent Vitals/Pain       Hand Dominance Right   Extremity/Trunk Assessment Upper Extremity Assessment Upper Extremity Assessment: Generalized weakness   Lower Extremity Assessment Lower Extremity Assessment: Defer to PT evaluation       Communication Communication Communication: HOH   Cognition Arousal/Alertness: Lethargic Behavior During Therapy: [facility] for tasks assessed/performed;Anxious Overall Cognitive Status: History of cognitive impairments - at baseline                                 General Comments: pt oriented to self only.  followed one step commands intermittently    General Comments  daughters present during eval.  Discussed need to keep his environment and routine consistent and structured, and allow him to do as much for self as possible.  Also discussed options for tub seat     Exercises     Shoulder Instructions      Home Living Family/patient expects to  be discharged to:: Private residence   Available Help at Discharge: Family;Available 24 hours/day Type of Home: House       Home Layout: One level     Bathroom Shower/Tub: Tub/shower unit;Door   [employer] Toilet: Standard     Home Equipment: Environmental consultant - 2 wheels;Bedside commode;Wheelchair - manual   Additional Comments: Family able to provide 24 hour assist at discharge       Prior Functioning/Environment Level of Independence: Needs assistance  Gait / Transfers Assistance Needed: typically ambulates with supervision  ADL's / Homemaking Assistance Needed: Pt self feeds with assist to scoop food due to visual deficits.  He is incontinent of urine and stool, and makes not attempt to clean himself up.  He requires max A for bathing and min - mod A for dressing    Comments: Patient lives with son, has 4 duagthers that help  to take care of them. assists with ADLs, but has been able to ambulate with supervision from family to go to church or out to breakfast, as of week he has not been able  to walk, falling 5x at home.         OT Problem List: Impaired balance (sitting and/or standing);Decreased cognition      OT Treatment/Interventions:      OT Goals(Current goals can be found in the care plan section) Acute Rehab OT Goals Patient Stated Goal: family hopes to discharge home soon  OT Goal Formulation: All assessment and education complete, DC therapy  OT Frequency:     Barriers to D/C:            Co-evaluation              AM-PAC PT "6 Clicks" Daily Activity     Outcome Measure Help from another person eating meals?: A Lot Help from another person taking care of personal grooming?: A Lot Help from another person toileting, which includes using toliet, bedpan, or urinal?: Total Help from another person bathing (including washing, rinsing, drying)?: A Lot Help from another person to put on and taking off regular upper body clothing?: A Lot Help from another person to put on and taking off regular lower body clothing?: A Lot 6 Click Score: 11   End of Session    Activity Tolerance: Patient limited by lethargy Patient left: in bed;with call bell/phone within reach;with bed alarm set;with family/visitor present  OT Visit Diagnosis: Cognitive communication deficit (R41.841)                Time: 9833-8250 OT Time Calculation (min): 25 min Charges:  OT General Charges $OT Visit: 1 Visit OT Evaluation $OT Eval Moderate Complexity: 1 Mod OT Treatments $Self Care/Home Management : 8-22 mins G-Codes:     [facility], OTR/L [phone]   Dempsy Damiano M 05/29/2018, 6:08 PM

## 2018-05-29 NOTE — Care Management Note (Deleted)
Case Management Note  Patient Details  Name: Robert Barnett MRN: [id number] Date of Birth: 10-02-1932  Subjective/Objective:                    Action/Plan:   Expected Discharge Date:  05/31/18               Expected Discharge Plan:  OP Rehab  In-House Referral:  Clinical Social Work  Discharge planning Services  CM Consult  Post Acute Care Choice:  Home Health Choice offered to:  Patient, Adult Children  DME Arranged:    DME Agency:     HH Arranged:  Refused SNF, RN, Nurse's Aide [place] Agency:     Status of Service:  In process, will continue to follow  If discussed at Long Length of Stay Meetings, dates discussed:    Additional Comments:  Kristen Cardinal, RN 05/29/2018, 12:17 PM

## 2018-05-29 NOTE — Progress Notes (Signed)
PROGRESS NOTE   EDWAR COE  MWN:[id number]    DOB: 1932/03/12    DOA: 05/27/2018  PCP: Janith Lima, MD   I have briefly reviewed patients previous medical records in [facility].  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  Reportedly started having diarrhea about 3 weeks ago, continued despite stopping newly started Namenda, no abdominal pain, nausea vomiting, fever or chills, no recent antibiotics, travel or sick contacts.  Admitted for diarrhea, hypotension, dehydration, acute on chronic kidney disease.  Improving.   Assessment & Plan:   Principal Problem:   Hypotension due to hypovolemia Active Problems:   Diarrhea with dehydration   History of completed stroke   Hypothyroidism   Moderate dementia without behavioral disturbance   Essential hypertension   Acute renal failure superimposed on stage 4 chronic kidney disease (HCC)   Diarrhea: Unclear etiology.  C. difficile testing & GI panel PCR negative.  Persisted despite stopping newly started Namenda.  Holding colchicine.  Discontinued empirically started azithromycin.  Improving.  As per family, no recent change in diet, new medications other than Namenda which was stopped.  PRN Imodium.  If persists, may need to consider outpatient GI consultation for further evaluation.  Added probiotics.  Dehydration: Secondary to GI losses and poor oral intake.  Improved.  Changed to IV D5 infusion d/t hypernatremia and hyperchloremia  Non-anion gap metabolic acidosis/mild hyperkalemia: Likely related to hypernatremia and hyperchloremia.  Change to D5 IV infusion.  Follow BMP in a.m.  Hypotension:  Secondary to dehydration.  Blood pressure 74/45 on arrival.  Responded to IV fluids.  Hold antihypertensives.  Blood pressures have normalized.  Acute on stage IV chronic kidney disease: Baseline creatinine approximately 2.2.  Presented with creatinine of 3.07 on admission.  Likely prerenal.  Hydrated with IV fluids.  Creatinine has improved to 2.37.  Continue IV fluids.  Dementia: Without behavioral disturbances.  Continue Aricept.  Discontinued Namenda.  History of CVA: No new focal neurological deficits.  Continue aspirin, Plavix and statins.  Chronic abdominal abscess: CT abdomen and pelvis shows resolution of abscess and no new abscesses.  As per daughter, patient has had abdominal drain for 7 years.  Hypothyroid: Continue Synthroid.  Pancreatic cyst: Noted incidentally on CT.  Follow-up imaging recommended.  Macrocytic anemia: Could be dilutional.  No bleeding reported.  Stable.  1 of 2 blood cultures positive for coagulase-negative staph: Likely contaminant.  Follow final culture results.  Abnormal LFTs: Unclear etiology.  Stable.  Thrombocytopenia: Unclear etiology.  DC subcutaneous heparin.  Place SCDs.  Follow CBC in a.m.   DVT prophylaxis: Subcutaneous heparin Code Status: Full Family Communication: Discussed in detail with patient's another daughter at bedside.  Updated care and answered questions. Disposition: Determined pending clinical improvement, possibly 6/2.  Family declines SNF recommended by PT and preferred to take him home with 24/7 supervision and home health PT.   Consultants:  None.  Procedures:  None  Antimicrobials:  Azithromycin-discontinue   Subjective: Patient poor historian due to advanced dementia.  States that he feels "fine".  Denies diarrhea or any complaints.  As per nursing, patient had 3 "squirts" of small soft stools.  Not watery.  As per daughter, patient sits  on the toilet and has a soft BM, gets up and then has small lumps of soft BM  "like mud" droppings on the floor soon after.  It almost feels like patient does not sit on the toilet to complete his BM.  ROS: As above.  Objective:  Vitals:   05/28/18 0945 05/28/18 1529 05/28/18 2225 05/29/18 0958  BP: (!) 102/57 (!) 125/59 (!) 144/79 (!) 105/46  Pulse: (!) 53 (!) 52 (!) 54 (!) 59  Resp: 18 18 20 18   Temp: 97.8 F (36.6 C) 97.9 F (36.6 C) 98.1 F (36.7 C) 97.8 F (36.6 C)  TempSrc: Oral Oral Oral Oral  SpO2: 100% 99% (!) 77% 100%  Weight:      Height:        Examination:  General exam: Pleasant elderly male, moderately built and nourished, sitting up comfortably in recliner chair.  Oral mucosa moist Respiratory system: Clear to auscultation. Respiratory effort normal.  Stable Cardiovascular system: S1 & S2 heard, RRR. No JVD, murmurs, rubs, gallops or clicks. No pedal edema.  Telemetry personally reviewed: SB in the 50s-occasionally SB in the 40s with first-degree AV block. Gastrointestinal system: Abdomen is nondistended, soft and nontender. No organomegaly or masses felt. Normal bowel sounds heard.  Abdominal drain site without acute findings.  Stable Central nervous system: Alert and oriented to person, partly to place but not to time. No focal neurological deficits.  No change/stable Extremities: Symmetric 5 x 5 power. Skin: No rashes, lesions or ulcers Psychiatry: Judgement and insight impaired. Mood & affect appropriate.     Data Reviewed: I have personally reviewed following labs and imaging studies  CBC: Recent Labs  Lab 05/27/18 1515 05/28/18 0734 05/29/18 0858  WBC 5.9 5.5 4.2  NEUTROABS 4.0 3.4  --   HGB 13.2 10.4* 10.4*  HCT 41.7 33.9* 33.8*  MCV 98.6 102.4* 102.1*  PLT 209 162 465*   Basic Metabolic Panel: Recent Labs  Lab 05/27/18 1515 05/28/18 0734 05/29/18 1348  NA 142 142 147*  K 5.2* 4.6 5.3*  CL 117* 113* 124*  CO2 17* 19* 15*  GLUCOSE 120* 67 93  BUN 44* 36* 29*  CREATININE 3.07* 2.63* 2.37*  CALCIUM 8.9 8.4*  8.6*   Liver Function Tests: Recent Labs  Lab 05/27/18 1515 05/28/18 0734 05/29/18 1348  AST 171* 173* 155*  ALT 92* 92* 83*  ALKPHOS 82 73 70  BILITOT 0.8 0.7 0.7  PROT 7.3 6.3* 6.0*  ALBUMIN 2.9* 2.6* 2.6*     Recent Results (from the past 240 hour(s))  Culture, blood (routine x 2)     Status: Abnormal (Preliminary result)   Collection Time: 05/27/18  4:11 PM  Result Value Ref Range Status   Specimen Description BLOOD RIGHT ANTECUBITAL  Final   Special Requests   Final    BOTTLES DRAWN AEROBIC AND ANAEROBIC Blood Culture adequate volume   Culture  Setup Time   Final    GRAM POSITIVE COCCI IN CLUSTERS IN BOTH AEROBIC AND ANAEROBIC BOTTLES CRITICAL RESULT CALLED TO, READ BACK BY AND VERIFIED WITH: N BATCHELDER,PHARMD AT 0900 05/28/18 BY L BENFIELD    Culture (A)  Final    STAPHYLOCOCCUS SPECIES (COAGULASE NEGATIVE) THE SIGNIFICANCE OF ISOLATING THIS ORGANISM FROM A SINGLE SET OF BLOOD CULTURES WHEN MULTIPLE SETS ARE DRAWN IS UNCERTAIN. PLEASE NOTIFY THE MICROBIOLOGY DEPARTMENT WITHIN ONE WEEK IF SPECIATION AND SENSITIVITIES ARE REQUIRED. Performed at [facility], [place] [address], [place], [place] [address]    Report Status  PENDING  Incomplete  Blood Culture ID Panel (Reflexed)     Status: Abnormal   Collection Time: 05/27/18  4:11 PM  Result Value Ref Range Status   Enterococcus species NOT DETECTED NOT DETECTED Final   Listeria monocytogenes NOT DETECTED NOT DETECTED Final   Staphylococcus species DETECTED (A) NOT DETECTED Final    Comment: Methicillin (oxacillin) susceptible coagulase negative staphylococcus. Possible blood culture contaminant (unless isolated from more than one blood culture draw or clinical case suggests pathogenicity). No antibiotic treatment is indicated for blood  culture contaminants. CRITICAL RESULT CALLED TO, READ BACK BY AND VERIFIED WITH: N BATCHELDER,PHARMD AT 0900 05/28/18 BY L BENFIELD    Staphylococcus aureus NOT DETECTED NOT DETECTED  Final   Methicillin resistance NOT DETECTED NOT DETECTED Final   Streptococcus species NOT DETECTED NOT DETECTED Final   Streptococcus agalactiae NOT DETECTED NOT DETECTED Final   Streptococcus pneumoniae NOT DETECTED NOT DETECTED Final   Streptococcus pyogenes NOT DETECTED NOT DETECTED Final   Acinetobacter baumannii NOT DETECTED NOT DETECTED Final   Enterobacteriaceae species NOT DETECTED NOT DETECTED Final   Enterobacter cloacae complex NOT DETECTED NOT DETECTED Final   Escherichia coli NOT DETECTED NOT DETECTED Final   Klebsiella oxytoca NOT DETECTED NOT DETECTED Final   Klebsiella pneumoniae NOT DETECTED NOT DETECTED Final   Proteus species NOT DETECTED NOT DETECTED Final   Serratia marcescens NOT DETECTED NOT DETECTED Final   Haemophilus influenzae NOT DETECTED NOT DETECTED Final   Neisseria meningitidis NOT DETECTED NOT DETECTED Final   Pseudomonas aeruginosa NOT DETECTED NOT DETECTED Final   Candida albicans NOT DETECTED NOT DETECTED Final   Candida glabrata NOT DETECTED NOT DETECTED Final   Candida krusei NOT DETECTED NOT DETECTED Final   Candida parapsilosis NOT DETECTED NOT DETECTED Final   Candida tropicalis NOT DETECTED NOT DETECTED Final    Comment: Performed at [facility], [address] [place], [place] [address]  Culture, blood (routine x 2)     Status: None (Preliminary result)   Collection Time: 05/27/18  4:17 PM  Result Value Ref Range Status   Specimen Description BLOOD RIGHT HAND  Final   Special Requests   Final    BOTTLES DRAWN AEROBIC AND ANAEROBIC Blood Culture adequate volume   Culture   Final    NO GROWTH 2 DAYS Performed at [facility], [place] [address] [place], [place] [address]    Report Status PENDING  Incomplete  Gastrointestinal Panel by PCR , Stool     Status: None   Collection Time: 05/28/18  5:38 AM  Result Value Ref Range Status   Campylobacter species NOT DETECTED NOT DETECTED Final   Plesimonas shigelloides NOT DETECTED NOT  DETECTED Final   Salmonella species NOT DETECTED NOT DETECTED Final   Yersinia enterocolitica NOT DETECTED NOT DETECTED Final   Vibrio species NOT DETECTED NOT DETECTED Final   Vibrio cholerae NOT DETECTED NOT DETECTED Final   Enteroaggregative E coli (EAEC) NOT DETECTED NOT DETECTED Final   Enteropathogenic E coli (EPEC) NOT DETECTED NOT DETECTED Final   Enterotoxigenic E coli (ETEC) NOT DETECTED NOT DETECTED Final   Shiga like toxin producing E coli (STEC) NOT DETECTED NOT DETECTED Final   Shigella/Enteroinvasive E coli (EIEC) NOT DETECTED NOT DETECTED Final   Cryptosporidium NOT DETECTED NOT DETECTED Final   Cyclospora cayetanensis NOT DETECTED NOT DETECTED Final   Entamoeba histolytica NOT DETECTED NOT DETECTED Final   Giardia lamblia NOT DETECTED NOT DETECTED Final   Adenovirus F40/41 NOT  DETECTED NOT DETECTED Final   Astrovirus NOT DETECTED NOT DETECTED Final   Norovirus GI/GII NOT DETECTED NOT DETECTED Final   Rotavirus A NOT DETECTED NOT DETECTED Final   Sapovirus (I, II, IV, and V) NOT DETECTED NOT DETECTED Final    Comment: Performed at [facility], [address], [place], [place] [address]  C difficile quick scan w PCR reflex     Status: None   Collection Time: 05/28/18  5:38 AM  Result Value Ref Range Status   C Diff antigen NEGATIVE NEGATIVE Final   C Diff toxin NEGATIVE NEGATIVE Final   C Diff interpretation No C. difficile detected.  Final    Comment: Performed at [facility], [place] [address] [place], [place] [address]         Radiology Studies: Ct Abdomen Pelvis Wo Contrast  Result Date: 05/27/2018 CLINICAL DATA:  Abdominal pain and fever. Abscess suspected. Patient had an abdomen abscess drained earlier this month. EXAM: CT ABDOMEN AND PELVIS WITHOUT CONTRAST TECHNIQUE: Multidetector CT imaging of the abdomen and pelvis was performed following the standard protocol without IV contrast. COMPARISON:  05/06/2018 FINDINGS: Lower chest: Heart is normal  size. Three-vessel coronary artery calcifications. Minor lung base subsegmental atelectasis. Persistent partly visualized right breast gynecomastia, which has been previously evaluated Hepatobiliary: Liver is unremarkable. There are dependent gallstones. No acute cholecystitis. No bile duct dilation. Pancreas: Small cystic lesion noted along the superior margin of the pancreatic neck and body measuring 2 cm. No other pancreatic abnormality. Spleen: Normal in size without focal abnormality. Adrenals/Urinary Tract: Bilateral renal cortical thinning. Posterior midpole right renal cyst. No other masses. No stones. No hydronephrosis. Normal ureters. Bladder wall is thickened anteriorly and superiorly adjacent to the drainage catheter, presumed reactive. No bladder mass or stone. Stomach/Bowel: Stomach and small bowel unremarkable. There are numerous colonic diverticula. Adjacent to the mid sigmoid colon, there is a pigtail catheter, which abuts the anterior superior bladder. The previous collection been completely evacuated. There is no evidence of residual abscess or of a new abscess. The adjacent sigmoid colon wall is without significant thickening. There is subtle inflammatory haziness adjacent to the decompressed prior collection and the mid sigmoid colon. No other colonic abnormality.  Normal appendix visualized. Vascular/Lymphatic: Aortic atherosclerosis. 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 Left common iliac artery is dilated to 2.5 cm. These findings are stable. No pathologically enlarged lymph nodes. Reproductive: Prostate is enlarged, but unchanged from the recent prior studies. Other: No ascites. Musculoskeletal: No fracture or acute finding. No osteoblastic or osteolytic lesions. IMPRESSION: 1. No evidence of a residual pelvic abscess or of a new abscess. The pigtail catheter lies just above the anterior superior bladder and below the mid sigmoid colon, completely evacuating the previous  collection. 2. There are mild inflammatory changes adjacent to the mid sigmoid colon, pigtail catheter and superior bladder. This is consistent with mild residual infection/inflammation. This has improved from the most recent prior CT. 3. Small cystic area in the anterior superior pancreatic neck and body measuring 2 cm. Recommend follow-up pancreatic protocol CT every 2 years x2 to establish stability. This follows consensus criteria for patients of this age. 4. 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 Electronically Signed   By: Lajean Manes M.D.   On: 05/27/2018 18:07   Dg Chest 2 View  Result Date: 05/27/2018 CLINICAL DATA:  Vomiting and diarrhea. Weakness. Low blood pressure. EXAM: CHEST - 2  VIEW COMPARISON:  No recent. FINDINGS: Mediastinum and hilar structures normal. Low lung volumes. Mild bibasilar atelectasis/infiltrates no pleural effusion or pneumothorax. Cardiomegaly with normal pulmonary vascularity. Degenerative change thoracic spine. IMPRESSION: 1. Mild bibasilar atelectasis/infiltrates. These are best identified on lateral view. 2.  Mild cardiomegaly with normal pulmonary vascularity. Electronically Signed   By: [place]   On: 05/27/2018 17:02        Scheduled Meds: . aspirin EC  81 mg Oral Daily  . clopidogrel  75 mg Oral Daily  . donepezil  10 mg Oral QHS  . heparin  5,000 Units Subcutaneous Q8H  . levothyroxine  50 mcg Oral QAC breakfast  . simvastatin  20 mg Oral q1800  . sodium chloride flush  3 mL Intravenous Q12H   Continuous Infusions:    LOS: 2 days     Vernell Leep, MD, FACP, [facility]. [facility] Pager [phone]  If 7PM-7AM, please contact night-coverage [email or web address] Password [facility] 05/29/2018, 3:42 PM

## 2018-05-29 NOTE — Progress Notes (Signed)
Pt. confused;in bed and wants to sit on sit of bed alone; explained to pt. that we can't let him sit alone; he's a fall risk, and he's constantly forgetting he's in the hospital; stating that we better leave him alone- he's going to get his gun and knife; tried to assist with urinal and pt. wet bed with urine and will not let staff change bed. Pt. stated for staff not to touch him; told him his family will not like it that his bed is wet and stated he didn't care about his family. Charge nurse Kim aware and in room also. Tried to assist pt. to recliner and sit at nurses' station/to change bed and pt. wouldn't stand and stated that we are not going to "boss" him.

## 2018-05-29 NOTE — Progress Notes (Signed)
Pt. Trying to stand and void again and urinated on the bed again; while stand able to quickly change bed linens; bed alarm back on.

## 2018-05-29 NOTE — Plan of Care (Signed)
  Problem: Education: Goal: Knowledge of General Education information will improve Outcome: Not Progressing Note:  Pt. confused and not understanding what's going on; POC reviewed with pt.

## 2018-05-29 NOTE — Progress Notes (Signed)
Physical Therapy Treatment Patient Details Name: Robert Barnett MRN: [id number] DOB: October 24, 1932 Today's Date: 05/29/2018    History of Present Illness 84 year old male, lives with his son, independent with his activities until 5 days prior to admission, PMH of HTN, stage IV chronic kidney disease, dementia, chronic abdominal abscess with percutaneous chronic drain, CVA, hypothyroid, presented to the ED at the direction of his PCP for evaluation of diarrhea, generalized weakness, hypotension and frequent falls.  Reportedly started having diarrhea about 3 weeks ago, continued despite stopping newly started Namenda, no abdominal pain, nausea vomiting, fever or chills, no recent antibiotics, travel or sick contacts.  Admitted for diarrhea, hypotension, dehydration, acute on chronic kidney disease.  Improving.    PT Comments    Session focused on family education concerning levels of assistance and reducing risk of falls for at home. All 5 siblings/caretakers present and confirm they will provide 24/7 support at home, all questions and concerns answered from mobility standpoint. Family requesting OP therapy as in the past HHPT has not been successful.   Follow Up Recommendations  Outpatient PT 24 hour supervision.      Equipment Recommendations  3in1 (PT);Other (comment)    Recommendations for Other Services OT consult     Precautions / Restrictions Precautions Precautions: Fall Precaution Comments: AOx1 person only Restrictions Weight Bearing Restrictions: No    Mobility  Bed Mobility                  Transfers                    Ambulation/Gait                 Stairs             Wheelchair Mobility    Modified Rankin (Stroke Patients Only)       Balance                                            Cognition Arousal/Alertness: Lethargic Behavior During Therapy: WFL for tasks assessed/performed;Anxious Overall Cognitive  Status: History of cognitive impairments - at baseline                                 General Comments: pt oriented to self only.  followed one step commands intermittently       Exercises      General Comments General comments (skin integrity, edema, etc.): Extensive discussion with all 5 sibilings regarding caretaking. see impression statement above.       Pertinent Vitals/Pain      Home Living                      Prior Function            PT Goals (current goals can now be found in the care plan section) Acute Rehab PT Goals PT Goal Formulation: With patient/family Time For Goal Achievement: 06/12/18 Potential to Achieve Goals: Good Progress towards PT goals: Progressing toward goals    Frequency    Min 2X/week      PT Plan Discharge plan needs to be updated    Co-evaluation              AM-PAC PT "6 Clicks" Daily Activity  Outcome Measure  Difficulty  turning over in bed (including adjusting bedclothes, sheets and blankets)?: A Little Difficulty moving from lying on back to sitting on the side of the bed? : A Little Difficulty sitting down on and standing up from a chair with arms (e.g., wheelchair, bedside commode, etc,.)?: A Little Help needed moving to and from a bed to chair (including a wheelchair)?: A Little Help needed walking in hospital room?: A Little Help needed climbing 3-5 steps with a railing? : A Lot 6 Click Score: 17    End of Session   Activity Tolerance: Patient tolerated treatment well Patient left: in chair;with family/visitor present;with call bell/phone within reach Nurse Communication: Mobility status PT Visit Diagnosis: Unsteadiness on feet (R26.81);Other (comment)     Time: 1130-1200 PT Time Calculation (min) (ACUTE ONLY): 30 min  Charges:  $Self Care/Home Management: 8-22                    G Codes:       Reinaldo Berber, PT, DPT Acute Rehab Services Pager: [phone]     Reinaldo Berber 05/29/2018, 10:46 PM

## 2018-05-30 ENCOUNTER — Other Ambulatory Visit: Payer: Self-pay | Admitting: Internal Medicine

## 2018-05-30 DIAGNOSIS — I10 Essential (primary) hypertension: Secondary | ICD-10-CM

## 2018-05-30 DIAGNOSIS — E039 Hypothyroidism, unspecified: Secondary | ICD-10-CM

## 2018-05-30 DIAGNOSIS — M10071 Idiopathic gout, right ankle and foot: Secondary | ICD-10-CM

## 2018-05-30 LAB — COMPREHENSIVE METABOLIC PANEL
ALBUMIN: 2.4 g/dL — AB (ref 3.5–5.0)
ALT: 70 U/L — ABNORMAL HIGH (ref 17–63)
ANION GAP: 6 (ref 5–15)
AST: 129 U/L — ABNORMAL HIGH (ref 15–41)
Alkaline Phosphatase: 68 U/L (ref 38–126)
BILIRUBIN TOTAL: 0.8 mg/dL (ref 0.3–1.2)
BUN: 22 mg/dL — ABNORMAL HIGH (ref 6–20)
CO2: 17 mmol/L — ABNORMAL LOW (ref 22–32)
Calcium: 8.4 mg/dL — ABNORMAL LOW (ref 8.9–10.3)
Chloride: 118 mmol/L — ABNORMAL HIGH (ref 101–111)
Creatinine, Ser: 2.05 mg/dL — ABNORMAL HIGH (ref 0.61–1.24)
GFR calc Af Amer: 33 mL/min — ABNORMAL LOW (ref >60)
GFR, EST NON AFRICAN AMERICAN: 28 mL/min — AB (ref >60)
Glucose, Bld: 99 mg/dL (ref 65–99)
POTASSIUM: 5.2 mmol/L — AB (ref 3.5–5.1)
Sodium: 141 mmol/L (ref 135–145)
TOTAL PROTEIN: 5.7 g/dL — AB (ref 6.5–8.1)

## 2018-05-30 LAB — CULTURE, BLOOD (ROUTINE X 2): Special Requests: ADEQUATE

## 2018-05-30 LAB — CBC
HEMATOCRIT: 29.8 % — AB (ref 39.0–52.0)
HEMOGLOBIN: 9.8 g/dL — AB (ref 13.0–17.0)
MCH: 31.7 pg (ref 26.0–34.0)
MCHC: 32.9 g/dL (ref 30.0–36.0)
MCV: 96.4 fL (ref 78.0–100.0)
Platelets: 133 10*3/uL — ABNORMAL LOW (ref 150–400)
RBC: 3.09 MIL/uL — ABNORMAL LOW (ref 4.22–5.81)
RDW: 15.6 % — ABNORMAL HIGH (ref 11.5–15.5)
WBC: 4.9 10*3/uL (ref 4.0–10.5)

## 2018-05-30 MED ORDER — SODIUM BICARBONATE 650 MG PO TABS: 650.0000 mg | ORAL_TABLET | Freq: Two times a day (BID) | ORAL | 0 refills | Status: DC

## 2018-05-30 NOTE — Progress Notes (Signed)
Pt is arguing and combative. He want allow Otila Kluver and Myself to change his wet sheet. Bed alarm placed on bed and pt left alone.

## 2018-05-30 NOTE — Discharge Instructions (Signed)
Please get your medications reviewed and adjusted by your Primary MD. ° °Please request your Primary MD to go over all Hospital Tests and Procedure/Radiological results at the follow up, please get all Hospital records sent to your Prim MD by signing hospital release before you go home. ° °If you had Pneumonia of Lung problems at the Hospital: °Please get a 2 view Chest X ray done in 6-8 weeks after hospital discharge or sooner if instructed by your Primary MD. ° °If you have Congestive Heart Failure: °Please call your Cardiologist or Primary MD anytime you have any of the following symptoms:  °1) 3 pound weight gain in 24 hours or 5 pounds in 1 week  °2) shortness of breath, with or without a dry hacking cough  °3) swelling in the hands, feet or stomach  °4) if you have to sleep on extra pillows at night in order to breathe ° °Follow cardiac low salt diet and 1.5 lit/day fluid restriction. ° °If you have diabetes °Accuchecks 4 times/day, Once in AM empty stomach and then before each meal. °Log in all results and show them to your primary doctor at your next visit. °If any glucose reading is under 80 or above 300 call your primary MD immediately. ° °If you have Seizure/Convulsions/Epilepsy: °Please do not drive, operate heavy machinery, participate in activities at heights or participate in high speed sports until you have seen by Primary MD or a Neurologist and advised to do so again. ° °If you had Gastrointestinal Bleeding: °Please ask your Primary MD to check a complete blood count within one week of discharge or at your next visit. Your endoscopic/colonoscopic biopsies that are pending at the time of discharge, will also need to followed by your Primary MD. ° °Get Medicines reviewed and adjusted. °Please take all your medications with you for your next visit with your Primary MD ° °Please request your Primary MD to go over all hospital tests and procedure/radiological results at the follow up, please ask your  Primary MD to get all Hospital records sent to his/her office. ° °If you experience worsening of your admission symptoms, develop shortness of breath, life threatening emergency, suicidal or homicidal thoughts you must seek medical attention immediately by calling 911 or calling your MD immediately  if symptoms less severe. ° °You must read complete instructions/literature along with all the possible adverse reactions/side effects for all the Medicines you take and that have been prescribed to you. Take any new Medicines after you have completely understood and accpet all the possible adverse reactions/side effects.  ° °Do not drive or operate heavy machinery when taking Pain medications.  ° °Do not take more than prescribed Pain, Sleep and Anxiety Medications ° °Special Instructions: If you have smoked or chewed Tobacco  in the last 2 yrs please stop smoking, stop any regular Alcohol  and or any Recreational drug use. ° °Wear Seat belts while driving. ° °Please note °You were cared for by a hospitalist during your hospital stay. If you have any questions about your discharge medications or the care you received while you were in the hospital after you are discharged, you can call the unit and asked to speak with the hospitalist on call if the hospitalist that took care of you is not available. Once you are discharged, your primary care physician will handle any further medical issues. Please note that NO REFILLS for any discharge medications will be authorized once you are discharged, as it is imperative that you   return to your primary care physician (or establish a relationship with a primary care physician if you do not have one) for your aftercare needs so that they can reassess your need for medications and monitor your lab values.  You can reach the hospitalist office at phone [phone] or fax [phone]   If you do not have a primary care physician, you can call [phone] for a physician  referral.  Potassium Content of Foods Potassium is a mineral found in many foods and drinks. It helps keep fluids and minerals balanced in your body and affects how steadily your heart beats. Potassium also helps control your blood pressure and keep your muscles and nervous system healthy. Certain health conditions and medicines may change the balance of potassium in your body. When this happens, you can help balance your level of potassium through the foods that you do or do not eat. Your health care provider or dietitian may recommend an amount of potassium that you should have each day. The following lists of foods provide the amount of potassium (in parentheses) per serving in each item.  High in potassium The following foods and beverages have 200 mg or more of potassium per serving:  Apricots, 2 raw or 5 dry (200 mg).  Artichoke, 1 medium (345 mg).  Avocado, raw,  each (245 mg).  Banana, 1 medium (425 mg).  Beans, lima, or baked beans, canned,  cup (280 mg).  Beans, white, canned,  cup (595 mg).  Beef roast, 3 oz (320 mg).  Beef, ground, 3 oz (270 mg).  Beets, raw or cooked,  cup (260 mg).  Bran muffin, 2 oz (300 mg).  Broccoli,  cup (230 mg).  Brussels sprouts,  cup (250 mg).  Cantaloupe,  cup (215 mg).  Cereal, 100% bran,  cup (200-400 mg).  Cheeseburger, single, fast food, 1 each (225-400 mg).  Chicken, 3 oz (220 mg).  Clams, canned, 3 oz (535 mg).  Crab, 3 oz (225 mg).  Dates, 5 each (270 mg).  Dried beans and peas,  cup (300-475 mg).  Figs, dried, 2 each (260 mg).  Fish: halibut, tuna, cod, snapper, 3 oz (480 mg).  Fish: salmon, haddock, swordfish, perch, 3 oz (300 mg).  Fish, tuna, canned 3 oz (200 mg).  [place] fries, fast food, 3 oz (470 mg).  Granola with fruit and nuts,  cup (200 mg).  Grapefruit juice,  cup (200 mg).  Greens, beet,  cup (655 mg).  Honeydew melon,  cup (200 mg).  Kale, raw, 1 cup (300 mg).  Kiwi, 1  medium (240 mg).  Kohlrabi, rutabaga, parsnips,  cup (280 mg).  Lentils,  cup (365 mg).  Mango, 1 each (325 mg).  Milk, chocolate, 1 cup (420 mg).  Milk: nonfat, low-fat, whole, buttermilk, 1 cup (350-380 mg).  Molasses, 1 Tbsp (295 mg).  Mushrooms,  cup (280) mg.  Nectarine, 1 each (275 mg).  Nuts: almonds, peanuts, hazelnuts, [place], cashew, mixed, 1 oz (200 mg).  Nuts, pistachios, 1 oz (295 mg).  Orange, 1 each (240 mg).  Orange juice,  cup (235 mg).  Papaya, medium,  fruit (390 mg).  Peanut butter, chunky, 2 Tbsp (240 mg).  Peanut butter, smooth, 2 Tbsp (210 mg).  Pear, 1 medium (200 mg).  Pomegranate, 1 whole (400 mg).  Pomegranate juice,  cup (215 mg).  Pork, 3 oz (350 mg).  Potato chips, salted, 1 oz (465 mg).  Potato, baked with skin, 1 medium (925 mg).  Potatoes, boiled,  cup (  255 mg).  Potatoes, mashed,  cup (330 mg).  Prune juice,  cup (370 mg).  Prunes, 5 each (305 mg).  Pudding, chocolate,  cup (230 mg).  Pumpkin, canned,  cup (250 mg).  Raisins, seedless,  cup (270 mg).  Seeds, sunflower or pumpkin, 1 oz (240 mg).  Soy milk, 1 cup (300 mg).  Spinach,  cup (420 mg).  Spinach, canned,  cup (370 mg).  Sweet potato, baked with skin, 1 medium (450 mg).  Swiss chard,  cup (480 mg).  Tomato or vegetable juice,  cup (275 mg).  Tomato sauce or puree,  cup (400-550 mg).  Tomato, raw, 1 medium (290 mg).  Tomatoes, canned,  cup (200-300 mg).  [place], 3 oz (250 mg).  Wheat germ, 1 oz (250 mg).  Winter squash,  cup (250 mg).  Yogurt, plain or fruited, 6 oz (260-435 mg).  Zucchini,  cup (220 mg).  Moderate in potassium The following foods and beverages have 50-200 mg of potassium per serving:  Apple, 1 each (150 mg).  Apple juice,  cup (150 mg).  Applesauce,  cup (90 mg).  Apricot nectar,  cup (140 mg).  Asparagus, small spears,  cup or 6 spears (155 mg).  Bagel, cinnamon raisin, 1 each (130  mg).  Bagel, egg or plain, 4 in., 1 each (70 mg).  Beans, green,  cup (90 mg).  Beans, yellow,  cup (190 mg).  Beer, regular, 12 oz (100 mg).  Beets, canned,  cup (125 mg).  Blackberries,  cup (115 mg).  Blueberries,  cup (60 mg).  Bread, whole wheat, 1 slice (70 mg).  Broccoli, raw,  cup (145 mg).  Cabbage,  cup (150 mg).  Carrots, cooked or raw,  cup (180 mg).  Cauliflower, raw,  cup (150 mg).  Celery, raw,  cup (155 mg).  Cereal, bran flakes, cup (120-150 mg).  Cheese, cottage,  cup (110 mg).  Cherries, 10 each (150 mg).  Chocolate, 1 oz bar (165 mg).  Coffee, brewed 6 oz (90 mg).  Corn,  cup or 1 ear (195 mg).  Cucumbers,  cup (80 mg).  Egg, large, 1 each (60 mg).  Eggplant,  cup (60 mg).  Endive, raw, cup (80 mg).  English muffin, 1 each (65 mg).  Fish, orange roughy, 3 oz (150 mg).  Frankfurter, beef or pork, 1 each (75 mg).  Fruit cocktail,  cup (115 mg).  Grape juice,  cup (170 mg).  Grapefruit,  fruit (175 mg).  Grapes,  cup (155 mg).  Greens: kale, turnip, collard,  cup (110-150 mg).  Ice cream or frozen yogurt, chocolate,  cup (175 mg).  Ice cream or frozen yogurt, vanilla,  cup (120-150 mg).  Lemons, limes, 1 each (80 mg).  Lettuce, all types, 1 cup (100 mg).  Mixed vegetables,  cup (150 mg).  Mushrooms, raw,  cup (110 mg).  Nuts: walnuts, pecans, or macadamia, 1 oz (125 mg).  Oatmeal,  cup (80 mg).  Okra,  cup (110 mg).  Onions, raw,  cup (120 mg).  Peach, 1 each (185 mg).  Peaches, canned,  cup (120 mg).  Pears, canned,  cup (120 mg).  Peas, green, frozen,  cup (90 mg).  Peppers, green,  cup (130 mg).  Peppers, red,  cup (160 mg).  Pineapple juice,  cup (165 mg).  Pineapple, fresh or canned,  cup (100 mg).  Plums, 1 each (105 mg).  Pudding, vanilla,  cup (150 mg).  Raspberries,  cup (90 mg).  Rhubarb,  cup (115 mg).  Rice, wild,  cup (80 mg).  Shrimp, 3  oz (155 mg).  Spinach, raw, 1 cup (170 mg).  Strawberries,  cup (125 mg).  Summer squash  cup (175-200 mg).  Swiss chard, raw, 1 cup (135 mg).  Tangerines, 1 each (140 mg).  Tea, brewed, 6 oz (65 mg).  Turnips,  cup (140 mg).  Watermelon,  cup (85 mg).  Wine, red, table, 5 oz (180 mg).  Wine, white, table, 5 oz (100 mg).  Low in potassium The following foods and beverages have less than 50 mg of potassium per serving.  Bread, white, 1 slice (30 mg).  Carbonated beverages, 12 oz (less than 5 mg).  Cheese, 1 oz (20-30 mg).  Cranberries,  cup (45 mg).  Cranberry juice cocktail,  cup (20 mg).  Fats and oils, 1 Tbsp (less than 5 mg).  Hummus, 1 Tbsp (32 mg).  Nectar: papaya, mango, or pear,  cup (35 mg).  Rice, white or brown,  cup (50 mg).  Spaghetti or macaroni,  cup cooked (30 mg).  Tortilla, flour or corn, 1 each (50 mg).  Waffle, 4 in., 1 each (50 mg).  Water chestnuts,  cup (40 mg).  This information is not intended to replace advice given to you by your health care provider. Make sure you discuss any questions you have with your health care provider. Document Released: 07/29/2005 Document Revised: 05/22/2016 Document Reviewed: 11/11/2013 Elsevier Interactive Patient Education  2018 [employer].  Acute Kidney Injury, Adult Acute kidney injury is a sudden worsening of kidney function. The kidneys are organs that have several jobs. They filter the blood to remove waste products and extra fluid. They also maintain a healthy balance of minerals and hormones in the body, which helps control blood pressure and keep bones strong. With this condition, your kidneys do not do their jobs as well as they should. This condition ranges from mild to severe. Over time it may develop into long-lasting (chronic) kidney disease. Early detection and treatment may prevent acute kidney injury from developing into a chronic condition. What are the causes? Common  causes of this condition include:  A problem with blood flow to the kidneys. This may be caused by: ? Low blood pressure (hypotension) or shock. ? Blood loss. ? Heart and blood vessel (cardiovascular) disease. ? Severe burns. ? Liver disease.  Direct damage to the kidneys. This may be caused by: ? Certain medicines. ? A kidney infection. ? Poisoning. ? Being around or in contact with toxic substances. ? A surgical wound. ? A hard, direct hit to the kidney area.  A sudden blockage of urine flow. This may be caused by: ? Cancer. ? Kidney stones. ? An enlarged prostate in males.  What are the signs or symptoms? Symptoms of this condition may not be obvious until the condition becomes severe. Symptoms of this condition can include:  Tiredness (lethargy), or difficulty staying awake.  Nausea or vomiting.  Swelling (edema) of the face, legs, ankles, or feet.  Problems with urination, such as: ? Abdominal pain, or pain along the side of your stomach (flank). ? Decreased urine production. ? Decrease in the force of urine flow.  Muscle twitches and cramps, especially in the legs.  Confusion or trouble concentrating.  Loss of appetite.  Fever.  How is this diagnosed? This condition may be diagnosed with tests, including:  Blood tests.  Urine tests.  Imaging tests.  A test in which a sample of tissue is  removed from the kidneys to be examined under a microscope (kidney biopsy).  How is this treated? Treatment for this condition depends on the cause and how severe the condition is. In mild cases, treatment may not be needed. The kidneys may heal on their own. In more severe cases, treatment will involve:  Treating the cause of the kidney injury. This may involve changing any medicines you are taking or adjusting your dosage.  Fluids. You may need specialized IV fluids to balance your body's needs.  Having a catheter placed to drain urine and prevent  blockages.  Preventing problems from occurring. This may mean avoiding certain medicines or procedures that can cause further injury to the kidneys.  In some cases treatment may also require:  A procedure to remove toxic wastes from the body (dialysis or continuous renal replacement therapy - CRRT).  Surgery. This may be done to repair a torn kidney, or to remove the blockage from the urinary system.  Follow these instructions at home: Medicines  Take over-the-counter and prescription medicines only as told by your health care provider.  Do not take any new medicines without your health care provider's approval. Many medicines can worsen your kidney damage.  Do not take any vitamin and mineral supplements without your health care provider's approval. Many nutritional supplements can worsen your kidney damage. Lifestyle  If your health care provider prescribed changes to your diet, follow them. You may need to decrease the amount of protein you eat.  Achieve and maintain a healthy weight. If you need help with this, ask your health care provider.  Start or continue an exercise plan. Try to exercise at least 30 minutes a day, 5 days a week.  Do not use any tobacco products, such as cigarettes, chewing tobacco, and e-cigarettes. If you need help quitting, ask your health care provider. General instructions  Keep track of your blood pressure. Report changes in your blood pressure as told by your health care provider.  Stay up to date with immunizations. Ask your health care provider which immunizations you need.  Keep all follow-up visits as told by your health care provider. This is important. Where to find more information:  American Association of Kidney Patients: BombTimer.gl  National Kidney Foundation: www.kidney.[place]: https://mathis.com/  [facility]: ? www.lifeoptions.org ? www.kidneyschool.org Contact a health care provider  if:  Your symptoms get worse.  You develop new symptoms. Get help right away if:  You develop symptoms of worsening kidney disease, which include: ? Headaches. ? Abnormally dark or light skin. ? Easy bruising. ? Frequent hiccups. ? Chest pain. ? Shortness of breath. ? End of menstruation in women. ? Seizures. ? Confusion or altered mental status. ? Abdominal or back pain. ? Itchiness.  You have a fever.  Your body is producing less urine.  You have pain or bleeding when you urinate. Summary  Acute kidney injury is a sudden worsening of kidney function.  Acute kidney injury can be caused by problems with blood flow to the kidneys, direct damage to the kidneys, and sudden blockage of urine flow.  Symptoms of this condition may not be obvious until it becomes severe. Symptoms may include edema, lethargy, confusion, nausea or vomiting, and problems passing urine.  This condition can usually be diagnosed with blood tests, urine tests, and imaging tests. Sometimes a kidney biopsy is done to diagnose this condition.  Treatment for this condition often involves treating the underlying cause. It is treated with  fluids, medicines, dialysis, diet changes, or surgery. This information is not intended to replace advice given to you by your health care provider. Make sure you discuss any questions you have with your health care provider. Document Released: 06/30/2011 Document Revised: 04/16/2017 Document Reviewed: 12/05/2016 Elsevier Interactive Patient Education  [employer].

## 2018-05-30 NOTE — Discharge Summary (Signed)
Physician Discharge Summary  Josh Nicolosi Scovill LKG:[id number] DOB: January 30, 1932  PCP: Janith Lima, MD  Admit date: 05/27/2018 Discharge date: 05/30/2018  Recommendations for Outpatient Follow-up:  1. Dr. Scarlette Calico, PCP in 3 days with repeat labs (CBC & CMP). 2. Consider outpatient GI consultation if patient has recurrent or persistent diarrhea.  Home Health: PT, RN and aide.  Family however prefers outpatient OT which has been arranged by case management. Equipment/Devices: 3 in 1, rolling walker, wheelchair  Discharge Condition: Improved and stable but remains at risk for decline.  This was discussed in detail with patient's 3 daughters at bedside who verbalized understanding. CODE STATUS: Full Diet recommendation: Heart healthy/low potassium diet.  Patient's daughters were provided with instructions regarding same.  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  Reportedly started having diarrhea about 3 weeks ago, continued despite stopping newly started Namenda, no abdominal pain, nausea vomiting, fever or chills, no recent antibiotics, travel or sick contacts.  Admitted for diarrhea, hypotension, dehydration, acute on chronic kidney disease.     Assessment & Plan:  Diarrhea: Unclear etiology.  C. difficile testing & GI panel PCR  negative.  Persisted outpatient despite stopping newly started Namenda.  Discontinued colchicine.  Discontinued empirically started azithromycin. As per family, no recent change in diet, new medications other than Namenda which was stopped.  PRN Imodium OTC, family aware.  If persists, may need to consider outpatient GI consultation for further evaluation. As per daughter, patient sits on the toilet and has a soft BM, gets up and then has small lumps of soft BM "like mud" droppings on the floor soon after.  It almost feels like patient does not sit on the toilet to complete his BM which maybe related to his progressive dementia.  Patient needs to be encouraged to sit on the toilet and complete his BM.   Improved.    Dehydration: Secondary to GI losses and poor oral intake.  Resolved after IV fluids.  Remains at risk for recurrent dehydration due to progressive dementia and inconsistent oral intake.  Non-anion gap metabolic acidosis/mild hyperkalemia: NAG likely related to hypernatremia and hyperchloremia.  IV fluids were changed to D5 infusion.  Hypernatremia resolved.  Hyperchloremia has improved from 124 to 118.  Bicarbonate has improved as well.  As his hyperchloremia improves, expect bicarbonate to normalize as well.  Added low-dose bicarbonate supplements for a week.  Follow BMP closely in the next few days as outpatient.  Hypotension: Secondary to dehydration.  Blood pressure 74/45 on arrival.  Responded to IV fluids.    Blood pressures remain normal despite holding all antihypertensives.  Reassess during outpatient follow-up.  Acute on stage IV chronic kidney disease: Baseline creatinine approximately 2.2.  Presented with creatinine of 3.07 on admission.  Likely prerenal.  Hydrated with IV fluids.  Creatinine has improved to baseline.  Encourage adequate oral hydration.  Discontinued benazepril due to current acute  on chronic kidney disease and mild hyperkalemia.  Follow BMP in a few days as  outpatient.  Hyperkalemia, mild: Benazepril discontinued.  Slightly improved compared to yesterday.  Oral bicarbonate started for metabolic acidosis should help.  Low potassium diet discussed with family.  Follow BMP in a few days as outpatient.  Dementia with behavioral disturbances:  Continue Aricept.  Discontinued Namenda.  It appears that patient has progressive, at least moderate if not advanced dementia complicated by hearing loss and visual impairment.  Discussed in detail with patient's 3 daughters at bedside today and advised them that patient is likely to gradually decline and if they are unable to provide 24/7 supervision and care at home, he may need placement.  May also need to consider outpatient palliative care consultation.  History of CVA: No new focal neurological deficits.  Continue aspirin, Plavix and statins.  Chronic abdominal abscess: CT abdomen and pelvis shows resolution of abscess and no new abscesses.  As per daughter, patient has had abdominal drain for 7 years.  Hypothyroid: Continue Synthroid.  Pancreatic cyst: Noted incidentally on CT.  Follow-up imaging recommended and can be pursued as outpatient depending on how aggressive family wishes to be..  Macrocytic anemia: Could be dilutional.  No bleeding reported.  Stable.  1 of 2 blood cultures positive for coagulase-negative staph: Likely contaminant.  Abnormal LFTs: Unclear etiology.  Stable.  Thrombocytopenia: Unclear etiology.  Stable.  Has intermittently had mild thrombocytopenia in 2018.  Adult failure to thrive: Multifactorial related to advanced age, progressive dementia, hearing and visual impairment and multiple significant comorbidities.  Discussion as above.    Consultants:  None.  Procedures:  None    Discharge Instructions  Discharge Instructions    Call MD for:   Complete by:  As directed    Worsening diarrhea.   Call MD for:  difficulty breathing, headache or visual  disturbances   Complete by:  As directed    Call MD for:  extreme fatigue   Complete by:  As directed    Call MD for:  persistant dizziness or light-headedness   Complete by:  As directed    Call MD for:  persistant nausea and vomiting   Complete by:  As directed    Call MD for:  severe uncontrolled pain   Complete by:  As directed    Call MD for:  temperature >100.4   Complete by:  As directed    Diet - low sodium heart healthy   Complete by:  As directed    Discharge instructions   Complete by:  As directed    Avoid high potassium diet.   Increase activity slowly   Complete by:  As directed        Medication List    STOP taking these medications   benazepril 40 MG tablet Commonly known as:  LOTENSIN   Colchicine 0.6 MG Caps   memantine 10 MG tablet Commonly known as:  NAMENDA     TAKE these medications   aspirin 81 MG tablet Take 81 mg by mouth daily.   clopidogrel 75 MG tablet Commonly known as:  PLAVIX TAKE 1 TABLET (75 MG TOTAL) BY MOUTH DAILY.   donepezil 10 MG tablet Commonly known as:  ARICEPT Take 1 tablet (10 mg total) by mouth at bedtime.   levothyroxine 50 MCG tablet Commonly known as:  SYNTHROID, LEVOTHROID TAKE 1 TABLET (50 MCG TOTAL) BY MOUTH DAILY.   simvastatin 20 MG tablet Commonly known as:  ZOCOR Take 20  mg by mouth daily.   sodium bicarbonate 650 MG tablet Take 1 tablet (650 mg total) by mouth 2 (two) times daily.   triamcinolone cream 0.5 % Commonly known as:  KENALOG Apply 1 application topically 2 (two) times daily.      Follow-up Information    [facility] Follow up.   Specialty:  Rehabilitation Why:  They will call you to set up appointment Contact information: [place] [id number] [facility] Follow up.   Why:  Arranged for delivery of 3-in-1, wheelchair to patient room prior to  discharge Contact information: [address] [place] [address] [phone]        Janith Lima, MD. Schedule an appointment as soon as possible for a visit in 3 day(s).   Specialty:  Internal Medicine Why:  To be seen with repeat labs (CBC & BMP). Contact information: [address] [place] [address] [phone]          No Known Allergies    Procedures/Studies: Ct Abdomen Pelvis Wo Contrast  Result Date: 05/27/2018 CLINICAL DATA:  Abdominal pain and fever. Abscess suspected. Patient had an abdomen abscess drained earlier this month. EXAM: CT ABDOMEN AND PELVIS WITHOUT CONTRAST TECHNIQUE: Multidetector CT imaging of the abdomen and pelvis was performed following the standard protocol without IV contrast. COMPARISON:  05/06/2018 FINDINGS: Lower chest: Heart is normal size. Three-vessel coronary artery calcifications. Minor lung base subsegmental atelectasis. Persistent partly visualized right breast gynecomastia, which has been previously evaluated Hepatobiliary: Liver is unremarkable. There are dependent gallstones. No acute cholecystitis. No bile duct dilation. Pancreas: Small cystic lesion noted along the superior margin of the pancreatic neck and body measuring 2 cm. No other pancreatic abnormality. Spleen: Normal in size without focal abnormality. Adrenals/Urinary Tract: Bilateral renal cortical thinning. Posterior midpole right renal cyst. No other masses. No stones. No hydronephrosis. Normal ureters. Bladder wall is thickened anteriorly and superiorly adjacent to the drainage catheter, presumed reactive. No bladder mass or stone. Stomach/Bowel: Stomach and small bowel unremarkable. There are numerous colonic diverticula. Adjacent to the mid sigmoid colon, there is a pigtail catheter, which abuts the anterior superior bladder. The previous collection been completely evacuated. There is no evidence of residual abscess or of a new abscess. The adjacent  sigmoid colon wall is without significant thickening. There is subtle inflammatory haziness adjacent to the decompressed prior collection and the mid sigmoid colon. No other colonic abnormality.  Normal appendix visualized. Vascular/Lymphatic: Aortic atherosclerosis. 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 Left common iliac artery is dilated to 2.5 cm. These findings are stable. No pathologically enlarged lymph nodes. Reproductive: Prostate is enlarged, but unchanged from the recent prior studies. Other: No ascites. Musculoskeletal: No fracture or acute finding. No osteoblastic or osteolytic lesions. IMPRESSION: 1. No evidence of a residual pelvic abscess or of a new abscess. The pigtail catheter lies just above the anterior superior bladder and below the mid sigmoid colon, completely evacuating the previous collection. 2. There are mild inflammatory changes adjacent to the mid sigmoid colon, pigtail catheter and superior bladder. This is consistent with mild residual infection/inflammation. This has improved from the most recent prior CT. 3. Small cystic area in the anterior superior pancreatic neck and body measuring 2 cm. Recommend follow-up pancreatic protocol CT every 2 years x2 to establish stability. This follows consensus criteria for patients of  this age. 4. 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 Electronically Signed   By: Lajean Manes M.D.   On: 05/27/2018 18:07    Dg Chest 2 View  Result Date: 05/27/2018 CLINICAL DATA:  Vomiting and diarrhea. Weakness. Low blood pressure. EXAM: CHEST - 2 VIEW COMPARISON:  No recent. FINDINGS: Mediastinum and hilar structures normal. Low lung volumes. Mild bibasilar atelectasis/infiltrates no pleural effusion or pneumothorax. Cardiomegaly with normal pulmonary vascularity. Degenerative change thoracic spine. IMPRESSION: 1. Mild bibasilar atelectasis/infiltrates. These are  best identified on lateral view. 2.  Mild cardiomegaly with normal pulmonary vascularity. Electronically Signed   By: Marcello Moores  Register   On: 05/27/2018 17:02    Subjective: "I am fine".  Denies complaints or diarrhea.  As per nursing, had one small smear of a BM overnight.  Had 2 BMs yesterday which were reported as small and soft not loose or watery.  No nausea, vomiting or abdominal pain reported.  Increased confusion overnight consistent with sundowning.  No other complaints reported by patient or 3 daughters at bedside.  Discharge Exam:  Vitals:   05/29/18 1958 05/30/18 0312 05/30/18 0524 05/30/18 0937  BP: (!) 146/63  (!) 108/34 (!) 108/50  Pulse: (!) 57  (!) 55 (!) 56  Resp: 20  (!) 21 20  Temp: 98.7 F (37.1 C)   97.8 F (36.6 C)  TempSrc: Oral   Oral  SpO2: 100%  100% 100%  Weight: 92 kg (202 lb 13.2 oz) 92 kg (202 lb 13.2 oz)    Height:        General exam: Pleasant elderly male, moderately built and nourished, sitting up comfortably on bedside commode this morning.  Oral mucosa moist Respiratory system: Clear to auscultation. Respiratory effort normal.   Cardiovascular system: S1 & S2 heard, RRR. No JVD, murmurs, rubs, gallops or clicks. No pedal edema.  Telemetry personally reviewed: SB in the 50s-SR with first-degree AV block. Gastrointestinal system: Abdomen is nondistended, soft and nontender. No organomegaly or masses felt. Normal bowel sounds heard.  Abdominal drain site without acute findings.   Central nervous system: Alert and oriented to person, partly to place but not to time. No focal neurological deficits.   Hard of hearing and visual impairment, chronic. Extremities: Symmetric 5 x 5 power. Skin: Patient has chronic lower mid back skin scar and as per daughter, related to his psoriasis.  No acute findings. Psychiatry: Judgement and insight impaired. Mood & affect appropriate.       The results of significant diagnostics from this hospitalization (including  imaging, microbiology, ancillary and laboratory) are listed below for reference.     Microbiology: Recent Results (from the past 240 hour(s))  Culture, blood (routine x 2)     Status: Abnormal   Collection Time: 05/27/18  4:11 PM  Result Value Ref Range Status   Specimen Description BLOOD RIGHT ANTECUBITAL  Final   Special Requests   Final    BOTTLES DRAWN AEROBIC AND ANAEROBIC Blood Culture adequate volume   Culture  Setup Time   Final    GRAM POSITIVE COCCI IN CLUSTERS IN BOTH AEROBIC AND ANAEROBIC BOTTLES CRITICAL RESULT CALLED TO, READ BACK BY AND VERIFIED WITH: N BATCHELDER,PHARMD AT 0900 05/28/18 BY L BENFIELD    Culture (A)  Final    STAPHYLOCOCCUS SPECIES (COAGULASE NEGATIVE) THE SIGNIFICANCE OF ISOLATING THIS ORGANISM FROM A SINGLE SET OF BLOOD CULTURES WHEN MULTIPLE SETS ARE DRAWN IS UNCERTAIN. PLEASE NOTIFY THE MICROBIOLOGY  DEPARTMENT WITHIN ONE WEEK IF SPECIATION AND SENSITIVITIES ARE REQUIRED. Performed at [facility], [place] [address] [place], [place] [address]    Report Status 05/30/2018 FINAL  Final  Blood Culture ID Panel (Reflexed)     Status: Abnormal   Collection Time: 05/27/18  4:11 PM  Result Value Ref Range Status   Enterococcus species NOT DETECTED NOT DETECTED Final   Listeria monocytogenes NOT DETECTED NOT DETECTED Final   Staphylococcus species DETECTED (A) NOT DETECTED Final    Comment: Methicillin (oxacillin) susceptible coagulase negative staphylococcus. Possible blood culture contaminant (unless isolated from more than one blood culture draw or clinical case suggests pathogenicity). No antibiotic treatment is indicated for blood  culture contaminants. CRITICAL RESULT CALLED TO, READ BACK BY AND VERIFIED WITH: N BATCHELDER,PHARMD AT 0900 05/28/18 BY L BENFIELD    Staphylococcus aureus NOT DETECTED NOT DETECTED Final   Methicillin resistance NOT DETECTED NOT DETECTED Final   Streptococcus species NOT DETECTED NOT DETECTED Final   Streptococcus  agalactiae NOT DETECTED NOT DETECTED Final   Streptococcus pneumoniae NOT DETECTED NOT DETECTED Final   Streptococcus pyogenes NOT DETECTED NOT DETECTED Final   Acinetobacter baumannii NOT DETECTED NOT DETECTED Final   Enterobacteriaceae species NOT DETECTED NOT DETECTED Final   Enterobacter cloacae complex NOT DETECTED NOT DETECTED Final   Escherichia coli NOT DETECTED NOT DETECTED Final   Klebsiella oxytoca NOT DETECTED NOT DETECTED Final   Klebsiella pneumoniae NOT DETECTED NOT DETECTED Final   Proteus species NOT DETECTED NOT DETECTED Final   Serratia marcescens NOT DETECTED NOT DETECTED Final   Haemophilus influenzae NOT DETECTED NOT DETECTED Final   Neisseria meningitidis NOT DETECTED NOT DETECTED Final   Pseudomonas aeruginosa NOT DETECTED NOT DETECTED Final   Candida albicans NOT DETECTED NOT DETECTED Final   Candida glabrata NOT DETECTED NOT DETECTED Final   Candida krusei NOT DETECTED NOT DETECTED Final   Candida parapsilosis NOT DETECTED NOT DETECTED Final   Candida tropicalis NOT DETECTED NOT DETECTED Final    Comment: Performed at [facility], [address] [place], [place] [address]  Culture, blood (routine x 2)     Status: None (Preliminary result)   Collection Time: 05/27/18  4:17 PM  Result Value Ref Range Status   Specimen Description BLOOD RIGHT HAND  Final   Special Requests   Final    BOTTLES DRAWN AEROBIC AND ANAEROBIC Blood Culture adequate volume   Culture   Final    NO GROWTH 3 DAYS Performed at [facility], [place] [address] [place], [place] [address]    Report Status PENDING  Incomplete  Gastrointestinal Panel by PCR , Stool     Status: None   Collection Time: 05/28/18  5:38 AM  Result Value Ref Range Status   Campylobacter species NOT DETECTED NOT DETECTED Final   Plesimonas shigelloides NOT DETECTED NOT DETECTED Final   Salmonella species NOT DETECTED NOT DETECTED Final   Yersinia enterocolitica NOT DETECTED NOT DETECTED Final   Vibrio  species NOT DETECTED NOT DETECTED Final   Vibrio cholerae NOT DETECTED NOT DETECTED Final   Enteroaggregative E coli (EAEC) NOT DETECTED NOT DETECTED Final   Enteropathogenic E coli (EPEC) NOT DETECTED NOT DETECTED Final   Enterotoxigenic E coli (ETEC) NOT DETECTED NOT DETECTED Final   Shiga like toxin producing E coli (STEC) NOT DETECTED NOT DETECTED Final   Shigella/Enteroinvasive E coli (EIEC) NOT DETECTED NOT DETECTED Final   Cryptosporidium NOT DETECTED NOT DETECTED Final   Cyclospora  cayetanensis NOT DETECTED NOT DETECTED Final   Entamoeba histolytica NOT DETECTED NOT DETECTED Final   Giardia lamblia NOT DETECTED NOT DETECTED Final   Adenovirus F40/41 NOT DETECTED NOT DETECTED Final   Astrovirus NOT DETECTED NOT DETECTED Final   Norovirus GI/GII NOT DETECTED NOT DETECTED Final   Rotavirus A NOT DETECTED NOT DETECTED Final   Sapovirus (I, II, IV, and V) NOT DETECTED NOT DETECTED Final    Comment: Performed at [facility], [place], [place] [address]  C difficile quick scan w PCR reflex     Status: None   Collection Time: 05/28/18  5:38 AM  Result Value Ref Range Status   C Diff antigen NEGATIVE NEGATIVE Final   C Diff toxin NEGATIVE NEGATIVE Final   C Diff interpretation No C. difficile detected.  Final    Comment: Performed at [facility], [place] [address] [place],  [address]     Labs: CBC: Recent Labs  Lab 05/27/18 1515 05/28/18 0734 05/29/18 0858 05/30/18 1004  WBC 5.9 5.5 4.2 4.9  NEUTROABS 4.0 3.4  --   --   HGB 13.2 10.4* 10.4* 9.8*  HCT 41.7 33.9* 33.8* 29.8*  MCV 98.6 102.4* 102.1* 96.4  PLT 209 162 133* 527*   Basic Metabolic Panel: Recent Labs  Lab 05/27/18 1515 05/28/18 0734 05/29/18 1348 05/30/18 1004  NA 142 142 147* 141  K 5.2* 4.6 5.3* 5.2*  CL 117* 113* 124* 118*  CO2 17* 19* 15* 17*  GLUCOSE 120* 67 93 99  BUN 44* 36* 29* 22*  CREATININE 3.07* 2.63* 2.37* 2.05*  CALCIUM 8.9 8.4* 8.6* 8.4*   Liver Function  Tests: Recent Labs  Lab 05/27/18 1515 05/28/18 0734 05/29/18 1348 05/30/18 1004  AST 171* 173* 155* 129*  ALT 92* 92* 83* 70*  ALKPHOS 82 73 70 68  BILITOT 0.8 0.7 0.7 0.8  PROT 7.3 6.3* 6.0* 5.7*  ALBUMIN 2.9* 2.6* 2.6* 2.4*    CBG: Recent Labs  Lab 05/29/18 0759  GLUCAP 70   Urinalysis    Component Value Date/Time   COLORURINE YELLOW 05/27/2018 1824   APPEARANCEUR HAZY (A) 05/27/2018 1824   LABSPEC 1.015 05/27/2018 1824   PHURINE 5.0 05/27/2018 1824   GLUCOSEU NEGATIVE 05/27/2018 1824   GLUCOSEU NEGATIVE 12/30/2017 1443   HGBUR LARGE (A) 05/27/2018 1824   BILIRUBINUR NEGATIVE 05/27/2018 1824   KETONESUR NEGATIVE 05/27/2018 1824   PROTEINUR NEGATIVE 05/27/2018 1824   UROBILINOGEN 0.2 12/30/2017 1443   NITRITE NEGATIVE 05/27/2018 1824   LEUKOCYTESUR TRACE (A) 05/27/2018 1824      Time coordinating discharge: 45 minutes  SIGNED:  Vernell Leep, MD, FACP, [facility]. [facility] Pager [phone]  If 7PM-7AM, please contact night-coverage [email or web address] Password [email or web address] 05/30/2018, 3:10 PM

## 2018-05-30 NOTE — Care Management Note (Signed)
Case Management Note  Patient Details  Name: Robert Barnett MRN: [id number] Date of Birth: 02-15-1932  Subjective/Objective:          (See previous CM note) Pt requests RW and states they want [facility] to provide [facility] services.           Action/Plan: Order placed for RW and Jermaine, liaison for [facility] advised of [facility] and RW need.  Expected Discharge Date:  05/31/18               Expected Discharge Plan:  [name]  In-House Referral:  Clinical Social Work  Discharge planning Services  CM Consult  Post Acute Care Choice:  Home Health Choice offered to:  Patient, Adult Children  DME Arranged:  Bedside commode, Lightweight manual wheelchair with seat cushion, Walker rolling DME Agency:  [place]:  Refused SNF, RN, Nurse's Aide, PT HH Agency:  [facility]  Status of Service:  Completed, signed off  If discussed at [place] of Stay Meetings, dates discussed:    Additional Comments:  Claudie Leach, RN 05/30/2018, 2:44 PM

## 2018-05-31 ENCOUNTER — Telehealth: Payer: Self-pay | Admitting: *Deleted

## 2018-05-31 NOTE — Telephone Encounter (Signed)
Called pt no answer LMOM for pt/daughter to call back to set-up hosp f/u w/Dr. Ronnald Ramp. Per d/c summary need to see PCP next 3 days. MSG has been sent to [facility].Marland KitchenJohny Chess

## 2018-06-01 ENCOUNTER — Telehealth: Payer: Self-pay

## 2018-06-01 LAB — CULTURE, BLOOD (ROUTINE X 2)
Culture: NO GROWTH
Special Requests: ADEQUATE

## 2018-06-01 NOTE — Telephone Encounter (Signed)
Did you call pt or pt dtr?

## 2018-06-01 NOTE — Telephone Encounter (Signed)
Patient can be scheduled with any provider that has an opening due to PCP being out of town.

## 2018-06-01 NOTE — Telephone Encounter (Signed)
LVM to inform patient he could see another provider for his hospital FU.

## 2018-06-01 NOTE — Telephone Encounter (Signed)
Copied from [place] [phone]. Topic: Appointment Scheduling - Scheduling Inquiry for Clinic >> May 31, 2018  3:14 PM Synthia Innocent wrote: Reason for CRM: Needing hospital follow up within 3 days. Able to work in? Please advise.   >> May 31, 2018  3:45 PM Para Skeans A wrote: Would you like to work patient in or make this with Jodi Mourning?

## 2018-06-01 NOTE — Telephone Encounter (Signed)
Called and left message with patient's daughter.

## 2018-06-01 NOTE — Telephone Encounter (Signed)
I called patient when I had a free moment at lunch. I will forward to someone else who can call the daughter, I am working up front and unable to call patients. I did not see she was the one who called.

## 2018-06-02 ENCOUNTER — Telehealth: Payer: Self-pay | Admitting: Internal Medicine

## 2018-06-02 ENCOUNTER — Telehealth (HOSPITAL_COMMUNITY): Payer: Self-pay | Admitting: Internal Medicine

## 2018-06-02 ENCOUNTER — Other Ambulatory Visit: Payer: Self-pay | Admitting: Internal Medicine

## 2018-06-02 DIAGNOSIS — I6300 Cerebral infarction due to thrombosis of unspecified precerebral artery: Secondary | ICD-10-CM

## 2018-06-02 NOTE — Telephone Encounter (Signed)
Pt is scheduled for hosp f/u on 06/14/18 pls advise on msg below.Marland KitchenJohny Chess

## 2018-06-02 NOTE — Telephone Encounter (Signed)
06/02/18  daughter called to say that patient is still continuing home health and will be for the next 3 weeks and would like for [place] to call her and reschedule him in 3 weeks

## 2018-06-02 NOTE — Telephone Encounter (Signed)
Copied from [place] [phone]. Topic: Quick Communication - See Telephone Encounter >> Jun 02, 2018  3:01 PM Vernona Rieger wrote: CRM for notification. See Telephone encounter for: 06/02/18.  Verdis Frederickson, [facility] from [facility] needs verbals to see him for nursing for 2 week 2 and 1 week 1. He wants to go to outpatient physical therapy in three weeks after nursing is done. Please advise [phone]

## 2018-06-02 NOTE — Telephone Encounter (Signed)
Per chart daughter called back yesterday and scheduled hosp f/u appt for 6/17. Did not complete TCM call due to pt not calling back within time frame.Marland KitchenJohny Barnett

## 2018-06-03 NOTE — Telephone Encounter (Signed)
MD is out of the office pls advise on nursing orders below.Marland KitchenJohny Chess

## 2018-06-03 NOTE — Telephone Encounter (Signed)
Ok for verbals 

## 2018-06-04 NOTE — Telephone Encounter (Signed)
Notified Robert Barnett w/MD response.Marland KitchenJohny Barnett

## 2018-06-08 ENCOUNTER — Ambulatory Visit (HOSPITAL_COMMUNITY): Payer: Medicare Other

## 2018-06-11 DIAGNOSIS — L02211 Cutaneous abscess of abdominal wall: Secondary | ICD-10-CM | POA: Diagnosis not present

## 2018-06-11 DIAGNOSIS — K862 Cyst of pancreas: Secondary | ICD-10-CM | POA: Diagnosis not present

## 2018-06-11 DIAGNOSIS — E039 Hypothyroidism, unspecified: Secondary | ICD-10-CM | POA: Diagnosis not present

## 2018-06-11 DIAGNOSIS — I959 Hypotension, unspecified: Secondary | ICD-10-CM | POA: Diagnosis not present

## 2018-06-11 DIAGNOSIS — E861 Hypovolemia: Secondary | ICD-10-CM | POA: Diagnosis not present

## 2018-06-11 DIAGNOSIS — F0391 Unspecified dementia with behavioral disturbance: Secondary | ICD-10-CM | POA: Diagnosis not present

## 2018-06-11 DIAGNOSIS — N184 Chronic kidney disease, stage 4 (severe): Secondary | ICD-10-CM | POA: Diagnosis not present

## 2018-06-11 DIAGNOSIS — Z7902 Long term (current) use of antithrombotics/antiplatelets: Secondary | ICD-10-CM | POA: Diagnosis not present

## 2018-06-11 DIAGNOSIS — Z8673 Personal history of transient ischemic attack (TIA), and cerebral infarction without residual deficits: Secondary | ICD-10-CM | POA: Diagnosis not present

## 2018-06-11 DIAGNOSIS — I129 Hypertensive chronic kidney disease with stage 1 through stage 4 chronic kidney disease, or unspecified chronic kidney disease: Secondary | ICD-10-CM | POA: Diagnosis not present

## 2018-06-11 DIAGNOSIS — E876 Hypokalemia: Secondary | ICD-10-CM | POA: Diagnosis not present

## 2018-06-11 DIAGNOSIS — Z7982 Long term (current) use of aspirin: Secondary | ICD-10-CM | POA: Diagnosis not present

## 2018-06-14 ENCOUNTER — Encounter

## 2018-06-14 ENCOUNTER — Ambulatory Visit: Payer: Medicare Other | Admitting: Internal Medicine

## 2018-06-14 ENCOUNTER — Other Ambulatory Visit (INDEPENDENT_AMBULATORY_CARE_PROVIDER_SITE_OTHER): Payer: Medicare Other

## 2018-06-14 ENCOUNTER — Encounter: Payer: Self-pay | Admitting: Internal Medicine

## 2018-06-14 VITALS — BP 130/60 | HR 71 | Temp 97.9°F | Resp 16 | Ht 71.0 in | Wt 201.0 lb

## 2018-06-14 DIAGNOSIS — D539 Nutritional anemia, unspecified: Secondary | ICD-10-CM

## 2018-06-14 DIAGNOSIS — I6300 Cerebral infarction due to thrombosis of unspecified precerebral artery: Secondary | ICD-10-CM

## 2018-06-14 DIAGNOSIS — Z23 Encounter for immunization: Secondary | ICD-10-CM | POA: Diagnosis not present

## 2018-06-14 DIAGNOSIS — I714 Abdominal aortic aneurysm, without rupture, unspecified: Secondary | ICD-10-CM

## 2018-06-14 DIAGNOSIS — I700 Atherosclerosis of aorta: Secondary | ICD-10-CM

## 2018-06-14 DIAGNOSIS — K5904 Chronic idiopathic constipation: Secondary | ICD-10-CM

## 2018-06-14 DIAGNOSIS — I10 Essential (primary) hypertension: Secondary | ICD-10-CM | POA: Diagnosis not present

## 2018-06-14 DIAGNOSIS — E039 Hypothyroidism, unspecified: Secondary | ICD-10-CM | POA: Diagnosis not present

## 2018-06-14 DIAGNOSIS — D508 Other iron deficiency anemias: Secondary | ICD-10-CM

## 2018-06-14 DIAGNOSIS — D520 Dietary folate deficiency anemia: Secondary | ICD-10-CM | POA: Diagnosis not present

## 2018-06-14 DIAGNOSIS — H539 Unspecified visual disturbance: Secondary | ICD-10-CM | POA: Diagnosis not present

## 2018-06-14 DIAGNOSIS — Z8673 Personal history of transient ischemic attack (TIA), and cerebral infarction without residual deficits: Secondary | ICD-10-CM | POA: Insufficient documentation

## 2018-06-14 DIAGNOSIS — N184 Chronic kidney disease, stage 4 (severe): Secondary | ICD-10-CM

## 2018-06-14 DIAGNOSIS — I69398 Other sequelae of cerebral infarction: Secondary | ICD-10-CM

## 2018-06-14 LAB — CBC WITH DIFFERENTIAL/PLATELET
BASOS ABS: 0.1 10*3/uL (ref 0.0–0.1)
Basophils Relative: 1.1 % (ref 0.0–3.0)
Eosinophils Absolute: 0.1 10*3/uL (ref 0.0–0.7)
Eosinophils Relative: 0.9 % (ref 0.0–5.0)
HCT: 34.1 % — ABNORMAL LOW (ref 39.0–52.0)
HEMOGLOBIN: 11.5 g/dL — AB (ref 13.0–17.0)
LYMPHS ABS: 1.2 10*3/uL (ref 0.7–4.0)
Lymphocytes Relative: 17.5 % (ref 12.0–46.0)
MCHC: 33.8 g/dL (ref 30.0–36.0)
MCV: 97.4 fl (ref 78.0–100.0)
MONOS PCT: 14.8 % — AB (ref 3.0–12.0)
Monocytes Absolute: 1 10*3/uL (ref 0.1–1.0)
NEUTROS PCT: 65.7 % (ref 43.0–77.0)
Neutro Abs: 4.4 10*3/uL (ref 1.4–7.7)
Platelets: 166 10*3/uL (ref 150.0–400.0)
RBC: 3.5 Mil/uL — ABNORMAL LOW (ref 4.22–5.81)
RDW: 16.9 % — ABNORMAL HIGH (ref 11.5–15.5)
WBC: 6.8 10*3/uL (ref 4.0–10.5)

## 2018-06-14 LAB — FOLATE: Folate: 5.8 ng/mL — ABNORMAL LOW (ref >5.9)

## 2018-06-14 LAB — BASIC METABOLIC PANEL
BUN: 26 mg/dL — ABNORMAL HIGH (ref 6–23)
CALCIUM: 9.2 mg/dL (ref 8.4–10.5)
CHLORIDE: 108 meq/L (ref 96–112)
CO2: 24 meq/L (ref 19–32)
Creatinine, Ser: 1.92 mg/dL — ABNORMAL HIGH (ref 0.40–1.50)
GFR: 43.15 mL/min — ABNORMAL LOW (ref >60.00)
GLUCOSE: 109 mg/dL — AB (ref 70–99)
POTASSIUM: 4.3 meq/L (ref 3.5–5.1)
SODIUM: 142 meq/L (ref 135–145)

## 2018-06-14 LAB — VITAMIN B12: VITAMIN B 12: 772 pg/mL (ref 211–911)

## 2018-06-14 LAB — FERRITIN: Ferritin: 186.7 ng/mL (ref 22.0–322.0)

## 2018-06-14 LAB — IBC PANEL
Iron: 43 ug/dL (ref 42–165)
Saturation Ratios: 15.5 % — ABNORMAL LOW (ref 20.0–50.0)
TRANSFERRIN: 198 mg/dL — AB (ref 212.0–360.0)

## 2018-06-14 LAB — TSH: TSH: 3.45 u[IU]/mL (ref 0.35–4.50)

## 2018-06-14 MED ORDER — CLOPIDOGREL BISULFATE 75 MG PO TABS: 75.0000 mg | ORAL_TABLET | Freq: Every day | ORAL | 1 refills | Status: DC

## 2018-06-14 MED ORDER — LINACLOTIDE 145 MCG PO CAPS: 145.0000 ug | ORAL_CAPSULE | Freq: Every day | ORAL | 0 refills | Status: DC

## 2018-06-14 MED ORDER — FERROUS SULFATE 325 (65 FE) MG PO TBEC: 325.0000 mg | DELAYED_RELEASE_TABLET | Freq: Every day | ORAL | 1 refills | Status: DC

## 2018-06-14 MED ORDER — ASPIRIN 81 MG PO TABS
81.0000 mg | ORAL_TABLET | Freq: Every day | ORAL | 1 refills | Status: AC
Start: 2018-06-14 — End: ?

## 2018-06-14 MED ORDER — FOLIC ACID 1 MG PO TABS
1.0000 mg | ORAL_TABLET | Freq: Every day | ORAL | 1 refills | Status: DC
Start: 2018-06-14 — End: 2018-12-08

## 2018-06-14 NOTE — Patient Instructions (Signed)

## 2018-06-14 NOTE — Progress Notes (Signed)
Subjective:  Patient ID: Robert Barnett, male    DOB: 1932-06-11  Age: 84 y.o. MRN: [id number]  CC: Anemia and Constipation   HPI Robert Barnett presents for f/up after recent admission about 3 weeks ago.  He had an acute diarrheal illness that led to dehydration and acute kidney injury.  He was admitted for several days and rapidly improved.  He was also found to be mildly anemic.  He feels much better but now complains of constipation and straining.  He has tried to treat this with over-the-counter remedies without much relief in his symptoms.  Outpatient Medications Prior to Visit  Medication Sig Dispense Refill  . Colchicine 0.6 MG CAPS TAKE 1 CAPSULE BY MOUTH TWICE A DAY 60 capsule 3  . donepezil (ARICEPT) 10 MG tablet Take 1 tablet (10 mg total) by mouth at bedtime. 90 tablet 3  . levothyroxine (SYNTHROID, LEVOTHROID) 50 MCG tablet TAKE 1 TABLET (50 MCG TOTAL) BY MOUTH DAILY. 90 tablet 0  . simvastatin (ZOCOR) 20 MG tablet Take 20 mg by mouth daily.    Marland Kitchen triamcinolone cream (KENALOG) 0.5 % Apply 1 application topically 2 (two) times daily. 454 g 1  . aspirin 81 MG tablet Take 81 mg by mouth daily.    . clopidogrel (PLAVIX) 75 MG tablet Take 1 tablet (75 mg total) by mouth daily. 30 tablet 0  . sodium bicarbonate 650 MG tablet Take 1 tablet (650 mg total) by mouth 2 (two) times daily. 15 tablet 0   No facility-administered medications prior to visit.     ROS Review of Systems  Constitutional: Positive for unexpected weight change (wt loss). Negative for appetite change, diaphoresis and fatigue.  HENT: Negative.  Negative for trouble swallowing.   Eyes: Negative for visual disturbance.  Respiratory: Negative for cough, chest tightness, shortness of breath and wheezing.   Cardiovascular: Negative for chest pain, palpitations and leg swelling.  Gastrointestinal: Positive for constipation. Negative for abdominal distention, abdominal pain, blood in stool, diarrhea, nausea and  vomiting.  Genitourinary: Negative.  Negative for difficulty urinating and dysuria.  Musculoskeletal: Negative.   Skin: Negative.   Allergic/Immunologic: Negative.   Neurological: Negative.  Negative for dizziness.  Hematological: Negative for adenopathy. Does not bruise/bleed easily.  Psychiatric/Behavioral: Positive for confusion and decreased concentration. Negative for agitation, dysphoric mood, sleep disturbance and suicidal ideas. The patient is not nervous/anxious.     Objective:  BP 130/60 (BP Location: Left Arm, Patient Position: Sitting, Cuff Size: Normal)   Pulse 71   Temp 97.9 F (36.6 C) (Oral)   Resp 16   Ht 5\' 11"  (1.803 m)   Wt 201 lb (91.2 kg)   SpO2 97%   BMI 28.03 kg/m   BP Readings from Last 3 Encounters:  06/14/18 130/60  05/30/18 (!) 108/50  05/27/18 106/64    Wt Readings from Last 3 Encounters:  06/14/18 201 lb (91.2 kg)  05/30/18 202 lb 13.2 oz (92 kg)  05/05/18 213 lb (96.6 kg)    Physical Exam  Constitutional: He is oriented to person, place, and time. No distress.  HENT:  Mouth/Throat: Oropharynx is clear and moist. No oropharyngeal exudate.  Eyes: Conjunctivae are normal. No scleral icterus.  Neck: Normal range of motion. Neck supple. No JVD present. No thyromegaly present.  Cardiovascular: Normal rate, regular rhythm and normal heart sounds. Exam reveals no gallop.  No murmur heard. Pulmonary/Chest: Effort normal and breath sounds normal. No respiratory distress. He has no wheezes. He has no rales.  Abdominal: Soft. Normal appearance and bowel sounds are normal. He exhibits no distension. There is no hepatosplenomegaly. There is no tenderness. There is no guarding. No hernia.    Musculoskeletal: Normal range of motion. He exhibits no edema, tenderness or deformity.  Lymphadenopathy:    He has no cervical adenopathy.  Neurological: He is alert and oriented to person, place, and time.  Skin: He is not diaphoretic.  Vitals  reviewed.   Lab Results  Component Value Date   WBC 6.8 06/14/2018   HGB 11.5 (L) 06/14/2018   HCT 34.1 (L) 06/14/2018   PLT 166.0 06/14/2018   GLUCOSE 109 (H) 06/14/2018   CHOL 113 07/30/2017   TRIG 95.0 07/30/2017   HDL 30.60 (L) 07/30/2017   LDLCALC 63 07/30/2017   ALT 70 (H) 05/30/2018   AST 129 (H) 05/30/2018   NA 142 06/14/2018   K 4.3 06/14/2018   CL 108 06/14/2018   CREATININE 1.92 (H) 06/14/2018   BUN 26 (H) 06/14/2018   CO2 24 06/14/2018   TSH 3.45 06/14/2018   INR 1.00 09/15/2017   HGBA1C 5.6 07/23/2016    Ct Abdomen Pelvis Wo Contrast  Result Date: 05/27/2018 CLINICAL DATA:  Abdominal pain and fever. Abscess suspected. Patient had an abdomen abscess drained earlier this month. EXAM: CT ABDOMEN AND PELVIS WITHOUT CONTRAST TECHNIQUE: Multidetector CT imaging of the abdomen and pelvis was performed following the standard protocol without IV contrast. COMPARISON:  05/06/2018 FINDINGS: Lower chest: Heart is normal size. Three-vessel coronary artery calcifications. Minor lung base subsegmental atelectasis. Persistent partly visualized right breast gynecomastia, which has been previously evaluated Hepatobiliary: Liver is unremarkable. There are dependent gallstones. No acute cholecystitis. No bile duct dilation. Pancreas: Small cystic lesion noted along the superior margin of the pancreatic neck and body measuring 2 cm. No other pancreatic abnormality. Spleen: Normal in size without focal abnormality. Adrenals/Urinary Tract: Bilateral renal cortical thinning. Posterior midpole right renal cyst. No other masses. No stones. No hydronephrosis. Normal ureters. Bladder wall is thickened anteriorly and superiorly adjacent to the drainage catheter, presumed reactive. No bladder mass or stone. Stomach/Bowel: Stomach and small bowel unremarkable. There are numerous colonic diverticula. Adjacent to the mid sigmoid colon, there is a pigtail catheter, which abuts the anterior superior bladder.  The previous collection been completely evacuated. There is no evidence of residual abscess or of a new abscess. The adjacent sigmoid colon wall is without significant thickening. There is subtle inflammatory haziness adjacent to the decompressed prior collection and the mid sigmoid colon. No other colonic abnormality.  Normal appendix visualized. Vascular/Lymphatic: Aortic atherosclerosis. 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 Left common iliac artery is dilated to 2.5 cm. These findings are stable. No pathologically enlarged lymph nodes. Reproductive: Prostate is enlarged, but unchanged from the recent prior studies. Other: No ascites. Musculoskeletal: No fracture or acute finding. No osteoblastic or osteolytic lesions. IMPRESSION: 1. No evidence of a residual pelvic abscess or of a new abscess. The pigtail catheter lies just above the anterior superior bladder and below the mid sigmoid colon, completely evacuating the previous collection. 2. There are mild inflammatory changes adjacent to the mid sigmoid colon, pigtail catheter and superior bladder. This is consistent with mild residual infection/inflammation. This has improved from the most recent prior CT. 3. Small cystic area in the anterior superior pancreatic neck and body measuring 2 cm. Recommend follow-up pancreatic protocol CT every 2 years x2 to establish stability. This follows consensus criteria for patients of this age. 4. 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 Electronically Signed   By: Lajean Manes M.D.   On: 05/27/2018 18:07   Dg Chest 2 View  Result Date: 05/27/2018 CLINICAL DATA:  Vomiting and diarrhea. Weakness. Low blood pressure. EXAM: CHEST - 2 VIEW COMPARISON:  No recent. FINDINGS: Mediastinum and hilar structures normal. Low lung volumes. Mild bibasilar atelectasis/infiltrates no pleural effusion or pneumothorax. Cardiomegaly with normal  pulmonary vascularity. Degenerative change thoracic spine. IMPRESSION: 1. Mild bibasilar atelectasis/infiltrates. These are best identified on lateral view. 2.  Mild cardiomegaly with normal pulmonary vascularity. Electronically Signed   By: Marcello Moores  Register   On: 05/27/2018 17:02    Assessment & Plan:   Robert Barnett was seen today for anemia and constipation.  Diagnoses and all orders for this visit:  Acquired hypothyroidism-his TSH is in the normal range.  He will remain on the current dose of levothyroxine. -     TSH; Future  Essential hypertension-his blood pressure is adequately well controlled.  Electrolytes are normal and renal function has improved. -     Basic metabolic panel; Future  CKD (chronic kidney disease) stage 4, GFR 15-29 ml/min (HCC) -improvement noted -     Basic metabolic panel; Future  Deficiency anemia-his H&H are a little better.  He is mildly deficient in folate and iron. -     CBC with Differential/Platelet; Future -     IBC panel; Future -     Vitamin B12; Future -     Ferritin; Future -     Folate; Future -     Vitamin B1; Future  Abdominal aortic aneurysm (AAA) without rupture (HCC)-no complications noted  Abdominal aortic atherosclerosis (HCC)-no complications noted.  We will continue risk factor modifications.  CVA, old, disturbances of vision -     aspirin 81 MG tablet; Take 1 tablet (81 mg total) by mouth daily. -     clopidogrel (PLAVIX) 75 MG tablet; Take 1 tablet (75 mg total) by mouth daily.  Cerebrovascular accident (CVA) due to thrombosis of precerebral artery (HCC) -     clopidogrel (PLAVIX) 75 MG tablet; Take 1 tablet (75 mg total) by mouth daily.  Chronic idiopathic constipation -     linaclotide (LINZESS) 145 MCG CAPS capsule; Take 1 capsule (145 mcg total) by mouth daily before breakfast.  Need for diphtheria-tetanus-pertussis (Tdap) vaccine -     Tdap vaccine greater than or equal to 7yo IM  Dietary folate deficiency anemia -      folic acid (FOLVITE) 1 MG tablet; Take 1 tablet (1 mg total) by mouth daily.  Iron deficiency anemia due to dietary causes -     ferrous sulfate 325 (65 FE) MG EC tablet; Take 1 tablet (325 mg total) by mouth daily with breakfast.   I have discontinued Hunt Oris. Warzecha's sodium bicarbonate. I have also changed his aspirin. Additionally, I am having him start on linaclotide, folic acid, and ferrous sulfate. Lastly, I am having him maintain his simvastatin, triamcinolone cream, donepezil, Colchicine, levothyroxine, and clopidogrel.  Meds ordered this encounter  Medications  . aspirin 81 MG tablet    Sig: Take 1 tablet (81 mg total) by mouth daily.    Dispense:  90 tablet    Refill:  1  . clopidogrel (PLAVIX) 75 MG tablet    Sig: Take 1 tablet (75 mg total) by mouth daily.    Dispense:  90 tablet    Refill:  1  . linaclotide (  LINZESS) 145 MCG CAPS capsule    Sig: Take 1 capsule (145 mcg total) by mouth daily before breakfast.    Dispense:  84 capsule    Refill:  0  . folic acid (FOLVITE) 1 MG tablet    Sig: Take 1 tablet (1 mg total) by mouth daily.    Dispense:  90 tablet    Refill:  1  . ferrous sulfate 325 (65 FE) MG EC tablet    Sig: Take 1 tablet (325 mg total) by mouth daily with breakfast.    Dispense:  90 tablet    Refill:  1     Follow-up: Return in about 3 months (around 09/14/2018).  Scarlette Calico, MD

## 2018-06-18 ENCOUNTER — Other Ambulatory Visit: Payer: Self-pay | Admitting: Internal Medicine

## 2018-06-18 ENCOUNTER — Encounter: Payer: Self-pay | Admitting: Internal Medicine

## 2018-06-18 DIAGNOSIS — F04 Amnestic disorder due to known physiological condition: Principal | ICD-10-CM

## 2018-06-18 DIAGNOSIS — E538 Deficiency of other specified B group vitamins: Secondary | ICD-10-CM

## 2018-06-18 LAB — VITAMIN B1

## 2018-06-18 MED ORDER — VITAMIN B-1 100 MG PO TABS: 100.0000 mg | ORAL_TABLET | Freq: Every day | ORAL | 1 refills | Status: DC

## 2018-06-28 ENCOUNTER — Other Ambulatory Visit (HOSPITAL_COMMUNITY): Payer: Self-pay | Admitting: Interventional Radiology

## 2018-06-28 DIAGNOSIS — K750 Abscess of liver: Secondary | ICD-10-CM

## 2018-06-29 ENCOUNTER — Encounter (HOSPITAL_COMMUNITY): Payer: Self-pay | Admitting: Interventional Radiology

## 2018-06-29 ENCOUNTER — Ambulatory Visit (HOSPITAL_COMMUNITY)
Admission: RE | Admit: 2018-06-29 | Discharge: 2018-06-29 | Disposition: A | Discharge status: Home / Self Care | Payer: Medicare Other | Source: Ambulatory Visit | Attending: Interventional Radiology | Admitting: Interventional Radiology

## 2018-06-29 ENCOUNTER — Other Ambulatory Visit (HOSPITAL_COMMUNITY): Payer: Self-pay | Admitting: Interventional Radiology

## 2018-06-29 DIAGNOSIS — Z4803 Encounter for change or removal of drains: Secondary | ICD-10-CM | POA: Insufficient documentation

## 2018-06-29 DIAGNOSIS — K632 Fistula of intestine: Secondary | ICD-10-CM | POA: Diagnosis not present

## 2018-06-29 DIAGNOSIS — K750 Abscess of liver: Secondary | ICD-10-CM

## 2018-06-29 HISTORY — PX: IR CATHETER TUBE CHANGE: IMG717

## 2018-06-29 MED ORDER — IOPAMIDOL (ISOVUE-300) INJECTION 61%
INTRAVENOUS | Status: AC
Start: 2018-06-29 — End: 2018-06-29
  Administered 2018-06-29: 12 mL
  Filled 2018-06-29: qty 50

## 2018-06-29 MED ORDER — LIDOCAINE HCL (PF) 1 % IJ SOLN
INTRAMUSCULAR | Status: DC | PRN
  Administered 2018-06-29: 5 mL

## 2018-06-29 MED ORDER — LIDOCAINE HCL 1 % IJ SOLN
INTRAMUSCULAR | Status: AC
  Filled 2018-06-29: qty 20

## 2018-07-08 ENCOUNTER — Other Ambulatory Visit: Payer: Self-pay | Admitting: Internal Medicine

## 2018-07-08 DIAGNOSIS — I6300 Cerebral infarction due to thrombosis of unspecified precerebral artery: Secondary | ICD-10-CM

## 2018-07-18 ENCOUNTER — Other Ambulatory Visit: Payer: Self-pay | Admitting: Internal Medicine

## 2018-07-18 DIAGNOSIS — I6300 Cerebral infarction due to thrombosis of unspecified precerebral artery: Secondary | ICD-10-CM

## 2018-08-10 ENCOUNTER — Ambulatory Visit: Payer: Medicare Other | Admitting: Internal Medicine

## 2018-08-10 ENCOUNTER — Encounter: Payer: Self-pay | Admitting: Internal Medicine

## 2018-08-10 VITALS — BP 132/68 | HR 66 | Temp 97.7°F | Resp 16 | Ht 71.0 in | Wt 187.1 lb

## 2018-08-10 DIAGNOSIS — R64 Cachexia: Secondary | ICD-10-CM | POA: Diagnosis not present

## 2018-08-10 MED ORDER — DRONABINOL 2.5 MG PO CAPS: 2.5000 mg | ORAL_CAPSULE | Freq: Two times a day (BID) | ORAL | 0 refills | Status: DC

## 2018-08-10 NOTE — Progress Notes (Signed)
Subjective:  Patient ID: Robert Barnett, male    DOB: 10/26/1932  Age: 84 y.o. MRN: [id number]  CC: Weight Loss   HPI Levan Aloia Cichy presents for concerns about a 63-month history of decreased appetite and weight loss.  The symptoms started about the same time that he started taking Aricept.  His daughter is with him today and says that she prepares him food and provides it to them but he is just not interested in eating it.  He denies abdominal pain, nausea, vomiting, diarrhea, or constipation.  He is not having any trouble with the percutaneous drain in his left lower abdomen.  Outpatient Medications Prior to Visit  Medication Sig Dispense Refill  . aspirin 81 MG tablet Take 1 tablet (81 mg total) by mouth daily. 90 tablet 1  . clopidogrel (PLAVIX) 75 MG tablet Take 1 tablet (75 mg total) by mouth daily. 90 tablet 1  . Colchicine 0.6 MG CAPS TAKE 1 CAPSULE BY MOUTH TWICE A DAY 60 capsule 3  . ferrous sulfate 325 (65 FE) MG EC tablet Take 1 tablet (325 mg total) by mouth daily with breakfast. 90 tablet 1  . folic acid (FOLVITE) 1 MG tablet Take 1 tablet (1 mg total) by mouth daily. 90 tablet 1  . levothyroxine (SYNTHROID, LEVOTHROID) 50 MCG tablet TAKE 1 TABLET (50 MCG TOTAL) BY MOUTH DAILY. 90 tablet 0  . simvastatin (ZOCOR) 20 MG tablet Take 20 mg by mouth daily.    Marland Kitchen thiamine (VITAMIN B-1) 100 MG tablet Take 1 tablet (100 mg total) by mouth daily. 90 tablet 1  . triamcinolone cream (KENALOG) 0.5 % Apply 1 application topically 2 (two) times daily. 454 g 1  . donepezil (ARICEPT) 10 MG tablet Take 1 tablet (10 mg total) by mouth at bedtime. 90 tablet 3  . linaclotide (LINZESS) 145 MCG CAPS capsule Take 1 capsule (145 mcg total) by mouth daily before breakfast. 84 capsule 0  . clopidogrel (PLAVIX) 75 MG tablet TAKE 1 TABLET BY MOUTH EVERY DAY 90 tablet 0   No facility-administered medications prior to visit.     ROS Review of Systems  Constitutional: Positive for appetite change and  unexpected weight change. Negative for activity change, diaphoresis and fatigue.  HENT: Negative.  Negative for trouble swallowing.   Eyes: Negative.   Respiratory: Negative for cough, chest tightness and shortness of breath.   Cardiovascular: Negative for chest pain, palpitations and leg swelling.  Gastrointestinal: Negative for abdominal pain, constipation, diarrhea, nausea and vomiting.  Endocrine: Negative.   Genitourinary: Negative.  Negative for difficulty urinating and dysuria.  Musculoskeletal: Negative.  Negative for back pain and myalgias.  Skin: Negative.  Negative for color change.  Allergic/Immunologic: Negative.   Neurological: Negative.  Negative for dizziness and weakness.  Hematological: Negative for adenopathy. Does not bruise/bleed easily.  Psychiatric/Behavioral: Positive for confusion and decreased concentration. Negative for agitation, behavioral problems, sleep disturbance and suicidal ideas. The patient is not nervous/anxious.     Objective:  BP 132/68 (BP Location: Left Arm, Patient Position: Sitting, Cuff Size: Large)   Pulse 66   Temp 97.7 F (36.5 C) (Oral)   Resp 16   Ht 5\' 11"  (1.803 m)   Wt 187 lb 1.9 oz (84.9 kg)   SpO2 96%   BMI 26.10 kg/m   BP Readings from Last 3 Encounters:  08/10/18 132/68  06/14/18 130/60  05/30/18 (!) 108/50    Wt Readings from Last 3 Encounters:  08/10/18 187 lb 1.9 oz (  84.9 kg)  06/14/18 201 lb (91.2 kg)  05/30/18 202 lb 13.2 oz (92 kg)    Physical Exam  Constitutional: He is oriented to person, place, and time. No distress.  HENT:  Mouth/Throat: Oropharynx is clear and moist. No oropharyngeal exudate.  Eyes: Conjunctivae are normal. No scleral icterus.  Neck: Normal range of motion. Neck supple. No JVD present. No thyromegaly present.  Cardiovascular: Normal rate, regular rhythm and normal heart sounds. Exam reveals no gallop and no friction rub.  No murmur heard. Pulmonary/Chest: Effort normal and breath  sounds normal. He has no wheezes. He has no rales.  Abdominal: Soft. Normal appearance and bowel sounds are normal. He exhibits no mass. There is no hepatosplenomegaly. There is no tenderness. No hernia.  The percutaneous drain site in the left lower abdomen looks well.  The bandages are intact and there is no tenderness, erythema, or exudate.  Musculoskeletal: Normal range of motion. He exhibits no edema, tenderness or deformity.  Lymphadenopathy:    He has no cervical adenopathy.  Neurological: He is alert and oriented to person, place, and time.  Skin: Skin is warm and dry. No rash noted. He is not diaphoretic.  Vitals reviewed.   Lab Results  Component Value Date   WBC 6.8 06/14/2018   HGB 11.5 (L) 06/14/2018   HCT 34.1 (L) 06/14/2018   PLT 166.0 06/14/2018   GLUCOSE 109 (H) 06/14/2018   CHOL 113 07/30/2017   TRIG 95.0 07/30/2017   HDL 30.60 (L) 07/30/2017   LDLCALC 63 07/30/2017   ALT 70 (H) 05/30/2018   AST 129 (H) 05/30/2018   NA 142 06/14/2018   K 4.3 06/14/2018   CL 108 06/14/2018   CREATININE 1.92 (H) 06/14/2018   BUN 26 (H) 06/14/2018   CO2 24 06/14/2018   TSH 3.45 06/14/2018   INR 1.00 09/15/2017   HGBA1C 5.6 07/23/2016    Ir Catheter Tube Change  Result Date: 06/29/2018 CLINICAL DATA:  Chronic colocutaneous fistula. Drain catheter inadvertently became removed. Fecal material draining from site. EXAM: ABSCESS DRAIN CATHETER REPLACEMENT UNDER FLUOROSCOPY ANESTHESIA/SEDATION: Lidocaine 1% subcutaneous PROCEDURE: The procedure, risks, benefits, and alternatives were explained to the patient. Questions regarding the procedure were encouraged and answered. The patient understands and consents to the procedure. The site of the prior catheter was prepped with Betadinein a sterile fashion, and a sterile drape was applied covering the operative field. A sterile gown and sterile gloves were used for the procedure. Local anesthesia was provided with 1% Lidocaine. Under  fluoroscopic guidance, a 5 [place] Kum[place] the catheter was gently advanced through the tract into the residual cavity. Fistula to the bowel was again demonstrated. The catheter was exchanged over an Amplatz wire for 14 French pigtail catheter, formed within the residual cavity. Catheter secured externally with 0 Prolene suture and StatLock and placed to gravity drain bag. Abdominal binder was placed at the request of the patient's daughter. The patient tolerated the procedure well. COMPLICATIONS: None immediate FINDINGS: Colocutaneous fistula was catheterized. Continued communication to the bowel was demonstrated. New 14 French pigtail drain catheter placed in the residual extraluminal cavity. IMPRESSION: 1. Technically successful abscess drain catheter replacement under fluoroscopy. Electronically Signed   By: Lucrezia Europe M.D.   On: 06/29/2018 10:55    Assessment & Plan:   Rama was seen today for weight loss.  Diagnoses and all orders for this visit:  Cachexia ([place])- I am concerned that the donezepil has decreased his appetite and has contributed to the weight loss.  I have asked he and his daughter for him to stop taking it to see if it helps improve his appetite.  I have also asked him to try dronabinol to see if it will help increase his appetite so that he can stop losing weight. -     dronabinol (MARINOL) 2.5 MG capsule; Take 1 capsule (2.5 mg total) by mouth 2 (two) times daily before lunch and supper.   I have discontinued Eyal Greenhaw. Mazo's donepezil and linaclotide. I am also having him start on dronabinol. Additionally, I am having him maintain his simvastatin, triamcinolone cream, Colchicine, levothyroxine, aspirin, clopidogrel, folic acid, ferrous sulfate, and thiamine.  Meds ordered this encounter  Medications  . dronabinol (MARINOL) 2.5 MG capsule    Sig: Take 1 capsule (2.5 mg total) by mouth 2 (two) times daily before lunch and supper.    Dispense:  60 capsule    Refill:  0      Follow-up: Return in about 3 months (around 11/10/2018).  Scarlette Calico, MD

## 2018-08-10 NOTE — Patient Instructions (Signed)
Failure to Thrive, Adult Failure to thrive is a group of problems. These problems include eating too little and losing weight. People who have this condition may do fewer and fewer activities over time. They may lose interest in being with friends or they may not want to eat or drink. Follow these instructions at home:  Take medicines only as told by your doctor.  Eat a healthy, well-balanced diet. Make sure that you eat enough.  Be active. Do strength training. A physical therapist can help to set up an exercise program that fits you.  Make sure that you are safe at home.  Make sure that you have a plan for what to do if you cannot make decisions for yourself. Contact a doctor if:  You are not able to eat well.  You are not able to move around.  You feel very sad.  You feel very hopeless. Get help right away if:  You think about ending your life.  You cannot eat or drink.  You do not get out of bed.  Staying at home is not safe.  You have a fever. This information is not intended to replace advice given to you by your health care provider. Make sure you discuss any questions you have with your health care provider. Document Released: 12/04/2011 Document Revised: 05/22/2016 Document Reviewed: 03/12/2015 Elsevier Interactive Patient Education  2018 Elsevier Inc.  

## 2018-08-12 ENCOUNTER — Telehealth: Payer: Self-pay | Admitting: Internal Medicine

## 2018-08-12 NOTE — Telephone Encounter (Signed)
Copied from [place] [id number]. Topic: Quick Communication - Rx Refill/Question >> Aug 12, 2018  9:59 AM Margot Ables wrote: Medication: dronabinol (MARINOL) 2.5 MG capsule - pt daughter, Carlyon Shadow, called stating this medication is not covered by insurance and they cannot afford $200+ for the medication - please call back to advise  Preferred Pharmacy (with phone number or street name): [id number] - [place], [place] - [place] [phone] (Phone) [phone] (Fax)

## 2018-08-12 NOTE — Telephone Encounter (Signed)
Is there an alternative to [employer]

## 2018-08-13 NOTE — Telephone Encounter (Signed)
no

## 2018-08-13 NOTE — Telephone Encounter (Signed)
Pt dtr Neoma Laming informed that there is no alternative to Marinol. Neoma Laming stated understanding.

## 2018-08-18 ENCOUNTER — Encounter (HOSPITAL_COMMUNITY): Payer: Self-pay | Admitting: Interventional Radiology

## 2018-08-18 ENCOUNTER — Ambulatory Visit (HOSPITAL_COMMUNITY)
Admission: RE | Admit: 2018-08-18 | Discharge: 2018-08-18 | Disposition: A | Discharge status: Home / Self Care | Payer: Medicare Other | Source: Ambulatory Visit | Attending: Interventional Radiology | Admitting: Interventional Radiology

## 2018-08-18 DIAGNOSIS — K750 Abscess of liver: Secondary | ICD-10-CM | POA: Insufficient documentation

## 2018-08-18 DIAGNOSIS — Z4803 Encounter for change or removal of drains: Secondary | ICD-10-CM | POA: Insufficient documentation

## 2018-08-18 HISTORY — PX: IR CATHETER TUBE CHANGE: IMG717

## 2018-08-18 MED ORDER — LIDOCAINE VISCOUS HCL 2 % MT SOLN
OROMUCOSAL | Status: DC | PRN
  Administered 2018-08-18: 1 mL via OROMUCOSAL

## 2018-08-18 MED ORDER — LIDOCAINE HCL 1 % IJ SOLN
INTRAMUSCULAR | Status: DC | PRN
  Administered 2018-08-18: 2 mL

## 2018-08-18 MED ORDER — LIDOCAINE HCL 1 % IJ SOLN
INTRAMUSCULAR | Status: AC
  Filled 2018-08-18: qty 20

## 2018-08-18 MED ORDER — IOPAMIDOL (ISOVUE-300) INJECTION 61%
INTRAVENOUS | Status: AC
  Administered 2018-08-18: 15 mL
  Filled 2018-08-18: qty 50

## 2018-08-18 MED ORDER — LIDOCAINE VISCOUS HCL 2 % MT SOLN
OROMUCOSAL | Status: AC
  Filled 2018-08-18: qty 15

## 2018-08-24 ENCOUNTER — Other Ambulatory Visit (HOSPITAL_COMMUNITY): Payer: Medicare Other

## 2018-08-25 ENCOUNTER — Other Ambulatory Visit: Payer: Self-pay | Admitting: Internal Medicine

## 2018-08-25 DIAGNOSIS — E039 Hypothyroidism, unspecified: Secondary | ICD-10-CM

## 2018-09-07 ENCOUNTER — Other Ambulatory Visit (HOSPITAL_COMMUNITY): Payer: Self-pay | Admitting: Interventional Radiology

## 2018-09-07 ENCOUNTER — Other Ambulatory Visit (HOSPITAL_COMMUNITY): Payer: Self-pay | Admitting: General Surgery

## 2018-09-07 DIAGNOSIS — K750 Abscess of liver: Secondary | ICD-10-CM

## 2018-09-07 DIAGNOSIS — K651 Peritoneal abscess: Secondary | ICD-10-CM

## 2018-09-08 ENCOUNTER — Ambulatory Visit (HOSPITAL_COMMUNITY): Payer: Medicare Other

## 2018-09-08 ENCOUNTER — Ambulatory Visit (HOSPITAL_COMMUNITY)
Admission: RE | Admit: 2018-09-08 | Discharge: 2018-09-08 | Disposition: A | Discharge status: Home / Self Care | Payer: Medicare Other | Source: Ambulatory Visit | Attending: General Surgery | Admitting: General Surgery

## 2018-09-08 ENCOUNTER — Encounter (HOSPITAL_COMMUNITY): Payer: Self-pay | Admitting: Interventional Radiology

## 2018-09-08 DIAGNOSIS — K651 Peritoneal abscess: Secondary | ICD-10-CM | POA: Insufficient documentation

## 2018-09-08 DIAGNOSIS — Z4682 Encounter for fitting and adjustment of non-vascular catheter: Secondary | ICD-10-CM | POA: Diagnosis present

## 2018-09-08 HISTORY — PX: IR CATHETER TUBE CHANGE: IMG717

## 2018-09-08 MED ORDER — LIDOCAINE HCL (PF) 1 % IJ SOLN
INTRAMUSCULAR | Status: DC | PRN
  Administered 2018-09-08: 5 mL

## 2018-09-08 MED ORDER — LIDOCAINE HCL 1 % IJ SOLN
INTRAMUSCULAR | Status: AC
  Filled 2018-09-08: qty 20

## 2018-09-08 MED ORDER — IOPAMIDOL (ISOVUE-300) INJECTION 61%
INTRAVENOUS | Status: AC
  Administered 2018-09-08: 10 mL
  Filled 2018-09-08: qty 50

## 2018-09-11 ENCOUNTER — Other Ambulatory Visit: Payer: Self-pay | Admitting: Internal Medicine

## 2018-09-18 ENCOUNTER — Other Ambulatory Visit: Payer: Self-pay | Admitting: Internal Medicine

## 2018-09-30 ENCOUNTER — Other Ambulatory Visit: Payer: Self-pay | Admitting: Internal Medicine

## 2018-09-30 NOTE — Telephone Encounter (Signed)
Requested medication (s) are due for refill today: Yes  Requested medication (s) are on the active medication list: Yes  Last refill:  No date listed; done by historical provider  Future visit scheduled: No  Notes to clinic:No Lipid panel listed in chart   Requested Prescriptions  Pending Prescriptions Disp Refills   simvastatin (ZOCOR) 20 MG tablet 30 tablet     Sig: Take 1 tablet (20 mg total) by mouth daily.     Cardiovascular:  Antilipid - Statins Failed - 09/30/2018  5:21 PM      Failed - Total Cholesterol in normal range and within 360 days    Cholesterol  Date Value Ref Range Status  07/30/2017 113 0 - 200 mg/dL Final    Comment:    ATP III Classification       Desirable:  < 200 mg/dL               Borderline High:  200 - 239 mg/dL          High:  > = 240 mg/dL         Failed - LDL in normal range and within 360 days    LDL Cholesterol  Date Value Ref Range Status  07/30/2017 63 0 - 99 mg/dL Final         Failed - HDL in normal range and within 360 days    HDL  Date Value Ref Range Status  07/30/2017 30.60 (L) >39.00 mg/dL Final         Failed - Triglycerides in normal range and within 360 days    Triglycerides  Date Value Ref Range Status  07/30/2017 95.0 0.0 - 149.0 mg/dL Final    Comment:    Normal:  <150 mg/dLBorderline High:  150 - 199 mg/dL         Passed - Patient is not pregnant      Passed - Valid encounter within last 12 months    Recent Outpatient Visits          1 month ago Cachexia [facility])   [place], Thomas L, MD   3 months ago Acquired hypothyroidism   [place], Thomas L, MD   4 months ago Bowel and bladder incontinence   [facility] -Georges Mouse, MD   6 months ago Eczema, unspecified type   [place], Thomas L, MD   9 months ago Acute left flank pain   [facility] -Mayer Camel, MD

## 2018-09-30 NOTE — Telephone Encounter (Signed)
Copied from [place] [id number]. Topic: Quick Communication - Rx Refill/Question >> Sep 30, 2018  4:54 PM Percell Belt A wrote: Medication: simvastatin (ZOCOR) 20 MG tablet [[id number]]   Has the patient contacted their pharmacy? Yes  (Agent: If no, request that the patient contact the pharmacy for the refill.) (Agent: If yes, when and what did the pharmacy advise?)  Preferred Pharmacy (with phone number or street name): best call  [phone] , [id number] [place], [place] [phone] (Phone)   Agent: Please be advised that RX refills may take up to 3 business days. We ask that you follow-up with your pharmacy.

## 2018-10-01 ENCOUNTER — Other Ambulatory Visit: Payer: Self-pay

## 2018-10-01 MED ORDER — SIMVASTATIN 20 MG PO TABS: 20.0000 mg | ORAL_TABLET | Freq: Every day | ORAL | 1 refills | Status: DC

## 2018-10-01 NOTE — Telephone Encounter (Signed)
Per chart refill has already been done by assistant this am../lmb

## 2018-10-14 ENCOUNTER — Emergency Department (HOSPITAL_COMMUNITY)
Admission: EM | Admit: 2018-10-14 | Discharge: 2018-10-14 | Disposition: A | Discharge status: Home / Self Care | Payer: Medicare Other | Attending: Emergency Medicine | Admitting: Emergency Medicine

## 2018-10-14 ENCOUNTER — Encounter (HOSPITAL_COMMUNITY): Payer: Self-pay | Admitting: Emergency Medicine

## 2018-10-14 ENCOUNTER — Other Ambulatory Visit: Payer: Self-pay

## 2018-10-14 ENCOUNTER — Ambulatory Visit: Payer: Self-pay

## 2018-10-14 DIAGNOSIS — N184 Chronic kidney disease, stage 4 (severe): Secondary | ICD-10-CM | POA: Diagnosis not present

## 2018-10-14 DIAGNOSIS — Z79899 Other long term (current) drug therapy: Secondary | ICD-10-CM | POA: Insufficient documentation

## 2018-10-14 DIAGNOSIS — F0390 Unspecified dementia without behavioral disturbance: Secondary | ICD-10-CM | POA: Insufficient documentation

## 2018-10-14 DIAGNOSIS — Z7982 Long term (current) use of aspirin: Secondary | ICD-10-CM | POA: Diagnosis not present

## 2018-10-14 DIAGNOSIS — I129 Hypertensive chronic kidney disease with stage 1 through stage 4 chronic kidney disease, or unspecified chronic kidney disease: Secondary | ICD-10-CM | POA: Diagnosis not present

## 2018-10-14 DIAGNOSIS — K632 Fistula of intestine: Secondary | ICD-10-CM

## 2018-10-14 DIAGNOSIS — Z87891 Personal history of nicotine dependence: Secondary | ICD-10-CM | POA: Insufficient documentation

## 2018-10-14 DIAGNOSIS — Z431 Encounter for attention to gastrostomy: Secondary | ICD-10-CM | POA: Diagnosis present

## 2018-10-14 NOTE — Telephone Encounter (Signed)
Patient's daughter called in and says that the tube the patient has in his stomach was pulled out last Sunday. She says she called the Surgeons office and they pretty much said the patient needed to have a bag placed on the outside of his stomach. She says she decided to call to the office to see what can be done to get the tube back in his stomach or figure something else out, because he keeps pulling out the tube. I advised to take the patient to the ED to be evaluated. She asked will they put the tube back in because a doctor will need to say to do it. I advised physicians are there and can assess the situation and decide the best course of action, she verbalized understanding.   Reason for Disposition . Nursing judgment  Protocols used: NO GUIDELINE OR REFERENCE AVAILABLE-A-AH

## 2018-10-14 NOTE — ED Provider Notes (Addendum)
[facility] EMERGENCY DEPARTMENT Provider Note   CSN: [id number] Arrival date & time: 10/14/18  [place]  History   Chief Complaint Chief Complaint  Patient presents with  . PEG tube out   HPI  Patient is an 84 year old male with history of dementia, prior stroke, CKD, and chronic colocutaneous fistula with indwelling drain presenting to the ED for drain dislodgment.  Per daughters at bedside, he pulled his drain out 4 days ago.  They state they have called his PCP and his surgeon trying to arrange replacement but have been unable to do so far.  They therefore presented to the ED tonight for assistance with this process.  There is been small amount of malodorous output from his fistula.  He continues to have rectal stool output as well.  No vomiting or diarrhea.  No fever.  No complaints of pain.  Good p.o. intake.  No other recent illness.  Past Medical History:  Diagnosis Date  . Arthritis   . BPH (benign prostatic hyperplasia)   . Carotid artery occlusion   . Dementia ([place])   . Dysphagia   . Hyperlipidemia   . Hypertension   . Peripheral vascular disease ([place])   . Stroke [facility])    Right hemispheric CVA  . Umbilical hernia     Patient Active Problem List   Diagnosis Date Noted  . Cachexia ([place]) 08/10/2018  . CVA, old, disturbances of vision 06/14/2018  . Chronic idiopathic constipation 06/14/2018  . Dietary folate deficiency anemia 06/14/2018  . Iron deficiency anemia due to dietary causes 06/14/2018  . Bowel and bladder incontinence 05/27/2018  . Pancreatic cyst   . Eczema 03/31/2018  . 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 CKD (chronic kidney disease) stage 4, GFR 15-29 ml/min (HCC) 09/03/2017  . AAA (abdominal aortic aneurysm) ([place]) 09/03/2017  . Essential hypertension 07/30/2017  . Abdominal aortic atherosclerosis ([place]) 08/19/2016  . Bilateral low back pain without sciatica 08/18/2016  . Hypothyroidism 07/23/2016  . Moderate dementia without behavioral  disturbance ([place]) 07/23/2016  . Hyperlipidemia 11/17/2014  . BPH (benign prostatic hyperplasia)     Past Surgical History:  Procedure Laterality Date  . CAROTID ENDARTERECTOMY  10/06/2007   Right CEA by Dr. Amedeo Plenty  . IR CATHETER TUBE CHANGE  09/15/2017  . IR CATHETER TUBE CHANGE  12/03/2017  . IR CATHETER TUBE CHANGE  12/23/2017  . IR CATHETER TUBE CHANGE  05/07/2018  . IR CATHETER TUBE CHANGE  06/29/2018  . IR CATHETER TUBE CHANGE  08/18/2018  . IR CATHETER TUBE CHANGE  09/08/2018  . IR GENERIC HISTORICAL  07/09/2016   IR RADIOLOGIST EVAL & MGMT 07/09/2016 Arne Cleveland, MD [facility] INTERV RAD  . IR RADIOLOGIST EVAL & MGMT  09/15/2017  . IR RADIOLOGIST EVAL & MGMT  05/06/2018  . PEG TUBE PLACEMENT  2008   after CVA        Home Medications    Prior to Admission medications   Medication Sig Start Date End Date Taking? Authorizing Provider  aspirin 81 MG tablet Take 1 tablet (81 mg total) by mouth daily. 06/14/18   Janith Lima, MD  benazepril (LOTENSIN) 40 MG tablet TAKE 1 TABLET BY MOUTH EVERY DAY 09/18/18   Janith Lima, MD  clopidogrel (PLAVIX) 75 MG tablet Take 1 tablet (75 mg total) by mouth daily. 06/14/18   Janith Lima, MD  Colchicine 0.6 MG CAPS TAKE 1 CAPSULE BY MOUTH TWICE A DAY 05/30/18   Janith Lima, MD  dronabinol [facility])  2.5 MG capsule Take 1 capsule (2.5 mg total) by mouth 2 (two) times daily before lunch and supper. 08/10/18   Janith Lima, MD  ferrous sulfate 325 (65 FE) MG EC tablet Take 1 tablet (325 mg total) by mouth daily with breakfast. 06/14/18   Janith Lima, MD  folic acid (FOLVITE) 1 MG tablet Take 1 tablet (1 mg total) by mouth daily. 06/14/18   Janith Lima, MD  levothyroxine (SYNTHROID, LEVOTHROID) 50 MCG tablet TAKE 1 TABLET (50 MCG TOTAL) BY MOUTH DAILY. 08/25/18   Janith Lima, MD  simvastatin (ZOCOR) 20 MG tablet Take 1 tablet (20 mg total) by mouth daily. 10/01/18   Janith Lima, MD  thiamine (VITAMIN B-1) 100 MG tablet Take 1 tablet  (100 mg total) by mouth daily. 06/18/18   Janith Lima, MD  triamcinolone cream (KENALOG) 0.5 % Apply 1 application topically 2 (two) times daily. 03/31/18   Janith Lima, MD    Family History Family History  Problem Relation Age of Onset  . Obesity Daughter     Social History Social History   Tobacco Use  . Smoking status: Former Research scientist (life sciences)  . Smokeless tobacco: Never Used  . Tobacco comment: remote h/o  Substance Use Topics  . Alcohol use: No  . Drug use: No     Allergies   Patient has no known allergies.   Review of Systems Review of Systems  Constitutional: Negative for fever.  HENT: Negative for congestion.   Respiratory: Negative for cough and shortness of breath.   Cardiovascular: Negative for chest pain.  Gastrointestinal: Negative for abdominal pain, diarrhea and vomiting.  Genitourinary: Negative for dysuria.  Skin: Positive for wound.  All other systems reviewed and are negative.    Physical Exam Updated Vital Signs BP 128/71 (BP Location: Right Arm)   Pulse 61   Temp 98.7 F (37.1 C) (Oral)   Resp 18   Ht 5\' 10"  (1.778 m)   Wt 81.6 kg   SpO2 99%   BMI 25.83 kg/m   Physical Exam  Constitutional: No distress.  HENT:  Head: Normocephalic and atraumatic.  Mouth/Throat: Oropharynx is clear and moist.  Eyes: Pupils are equal, round, and reactive to light.  Neck: Neck supple. No JVD present.  Cardiovascular: Normal rate, regular rhythm, normal heart sounds and intact distal pulses.  No murmur heard. Pulmonary/Chest: Breath sounds normal. No respiratory distress. He has no wheezes. He has no rales.  Abdominal: Soft. He exhibits no distension and no mass. There is no tenderness. There is no guarding.  There is a small fistula present in the LLQ with no surrounding erythema or purulence.  Small amount of malodorous output consistent with fecal material.  Musculoskeletal: Normal range of motion. He exhibits no edema.  Neurological: He is alert.    Skin: Skin is warm and dry.  Nursing note and vitals reviewed.    ED Treatments / Results  Labs (all labs ordered are listed, but only abnormal results are displayed) Labs Reviewed - No data to display  EKG None  Radiology No results found.  Procedures Procedures (including critical care time)  Medications Ordered in ED Medications - No data to display   Initial Impression / Assessment and Plan / ED Course  I have reviewed the triage vital signs and the nursing notes.  Pertinent labs & imaging results that were available during my care of the patient were reviewed by me and considered in my medical decision making (see  chart for details).  Patient is an 84 year old male with history of dementia, prior stroke, CKD, and chronic colocutaneous fistula with indwelling drain presenting to the ED for drain dislodgment as above.  No other active medical issues identified.  Case discussed with on-call IR Dr. Anselm Pancoast who will arrange close follow-up.  Patient afebrile and well-nourished.  Will discharge home.  Family informed of all ED findings. Return precautions and follow up plan reviewed. All questions answered.   Final Clinical Impressions(s) / ED Diagnoses   Final diagnoses:  Colocutaneous fistula     Prescilla Sours, MD 10/14/18 2313    Isla Pence, MD 10/14/18 2316

## 2018-10-14 NOTE — ED Notes (Signed)
Patient/caregiver verbalizes understanding of discharge instructions. Opportunity for questioning and answers were provided. Armband removed by staff, pt discharged from ED ambulatory w/ family.

## 2018-10-14 NOTE — ED Provider Notes (Addendum)
Patient placed in [facility] pathway, seen and evaluated   Chief Complaint: Drain displacement  HPI:   84 year old male presents with displacement of his drain for chronic pelvic abscess. Daughters are at bedside and state that they first noticed the drain was out four days ago. Unclear why they decided to present tonight. They are refusing vital signs because state this is a recurrent issue and just want to know whether or not IR can replace the drain tonight or if they have to follow up to have this done. Had discussion with attending Dr. Gilford Raid - they do not want to be charged for a visit. I advised that we will not be performing any consultation without vital signs and a medical chart.  ROS: +drain displacement  Physical Exam:   Gen: No distress  Neuro: Awake and Alert  Skin: Warm    Focused Exam: Abdomen: Stoma over the left lower abdomen which is not patent   Initiation of care has begun. The patient has been counseled on the process, plan, and necessity for staying for the completion/evaluation, and the remainder of the medical screening examination     Recardo Evangelist, PA-C 10/14/18 2018    Isla Pence, MD 10/14/18 2025

## 2018-10-14 NOTE — ED Notes (Addendum)
Family asking to find out if tube can be changed tonight. Refusing vitals until they know if they should stay or not. [facility] PA at bedside.

## 2018-10-14 NOTE — ED Triage Notes (Signed)
Pt reports PEG tube has come out on Sunday. Pt has hx of IR replacement of tube. Family does not want to stay and wait and wants to know if they can be helped tonight or not.

## 2018-10-15 ENCOUNTER — Other Ambulatory Visit (HOSPITAL_COMMUNITY): Payer: Self-pay | Admitting: Diagnostic Radiology

## 2018-10-15 ENCOUNTER — Encounter (HOSPITAL_COMMUNITY): Payer: Self-pay | Admitting: Interventional Radiology

## 2018-10-15 ENCOUNTER — Ambulatory Visit (HOSPITAL_COMMUNITY)
Admission: RE | Admit: 2018-10-15 | Discharge: 2018-10-15 | Disposition: A | Discharge status: Home / Self Care | Payer: Medicare Other | Source: Ambulatory Visit | Attending: Diagnostic Radiology | Admitting: Diagnostic Radiology

## 2018-10-15 DIAGNOSIS — Z4803 Encounter for change or removal of drains: Secondary | ICD-10-CM | POA: Diagnosis present

## 2018-10-15 DIAGNOSIS — L988 Other specified disorders of the skin and subcutaneous tissue: Secondary | ICD-10-CM | POA: Diagnosis not present

## 2018-10-15 HISTORY — PX: IR CATHETER TUBE CHANGE: IMG717

## 2018-10-15 MED ORDER — LIDOCAINE HCL 1 % IJ SOLN
INTRAMUSCULAR | Status: AC
  Filled 2018-10-15: qty 20

## 2018-10-15 MED ORDER — IOPAMIDOL (ISOVUE-300) INJECTION 61%
INTRAVENOUS | Status: AC
  Administered 2018-10-15: 15 mL
  Filled 2018-10-15: qty 50

## 2018-10-15 MED ORDER — LIDOCAINE HCL (PF) 1 % IJ SOLN
INTRAMUSCULAR | Status: DC | PRN
  Administered 2018-10-15: 5 mL

## 2018-10-18 NOTE — Telephone Encounter (Signed)
Routing to dr jones, fyi.... 

## 2018-11-17 ENCOUNTER — Ambulatory Visit: Payer: Medicare Other | Admitting: Internal Medicine

## 2018-11-17 ENCOUNTER — Encounter: Payer: Self-pay | Admitting: Internal Medicine

## 2018-11-17 VITALS — BP 132/62 | HR 59 | Temp 97.9°F | Resp 16 | Ht 70.0 in | Wt 207.0 lb

## 2018-11-17 DIAGNOSIS — N6310 Unspecified lump in the right breast, unspecified quadrant: Secondary | ICD-10-CM | POA: Diagnosis not present

## 2018-11-17 DIAGNOSIS — Z23 Encounter for immunization: Secondary | ICD-10-CM | POA: Diagnosis not present

## 2018-11-17 NOTE — Progress Notes (Signed)
Subjective:  Patient ID: Robert Barnett, male    DOB: [birth year]  Age: 84 y.o. MRN: [id number]  CC: Breast Mass   HPI [facility] presents for f/up - His daughter has noticed an enlargement in his right breast over the last few weeks.  This lesion was biopsied about 3 years ago.  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  Outpatient Medications Prior to Visit  Medication Sig Dispense Refill  . aspirin 81 MG tablet Take 1 tablet (81 mg total) by mouth daily. 90 tablet 1  . benazepril (LOTENSIN) 40 MG tablet TAKE 1 TABLET BY MOUTH EVERY DAY 90 tablet 1  . clopidogrel (PLAVIX) 75 MG tablet Take 1 tablet (75 mg total) by mouth daily. 90 tablet 1  . Colchicine 0.6 MG CAPS TAKE 1 CAPSULE BY MOUTH TWICE A DAY 60 capsule 3  . dronabinol (MARINOL) 2.5 MG capsule Take 1 capsule (2.5 mg total) by mouth 2 (two) times daily before lunch and supper. 60 capsule 0  . ferrous sulfate 325 (65 FE) MG EC tablet Take 1 tablet (325 mg total) by mouth daily with breakfast. 90 tablet 1  . folic acid (FOLVITE) 1 MG tablet Take 1 tablet (1 mg total) by mouth daily. 90 tablet 1  . levothyroxine (SYNTHROID, LEVOTHROID) 50 MCG tablet TAKE 1 TABLET (50 MCG TOTAL) BY MOUTH DAILY. 90 tablet 1  . simvastatin (ZOCOR) 20 MG tablet Take 1 tablet (20 mg total) by mouth daily. 90 tablet 1  . thiamine (VITAMIN B-1) 100 MG tablet Take 1 tablet (100 mg total) by mouth daily. 90 tablet 1  . triamcinolone cream (KENALOG) 0.5 % Apply 1 application topically 2 (two) times daily. 454 g 1   No facility-administered medications prior to visit.     ROS Review of Systems  Constitutional: Negative for diaphoresis, fatigue and unexpected weight change.  HENT: Negative.   Eyes: Negative.   Respiratory: Negative for cough, chest tightness and shortness of breath.   Cardiovascular: Negative for chest pain, palpitations and leg swelling.  Gastrointestinal: Negative for abdominal pain, constipation,  diarrhea, nausea and vomiting.  Endocrine: Negative.   Genitourinary: Negative.   Musculoskeletal: Negative.   Skin: Negative.   Hematological: Negative for adenopathy. Does not bruise/bleed easily.    Objective:  BP 132/62 (BP Location: Left Arm, Patient Position: Sitting, Cuff Size: Large)   Pulse (!) 59   Temp 97.9 F (36.6 C) (Oral)   Resp 16   Ht _0  (1.778 m)   Wt 207 lb (93.9 kg)   SpO2 97%   BMI 29.70 kg/m   BP Readings from Last 3 Encounters:  11/17/18 132/62  10/14/18 128/71  08/10/18 132/68    Wt Readings from Last 3 Encounters:  11/17/18 207 lb (93.9 kg)  10/14/18 180 lb (81.6 kg)  08/10/18 187 lb 1.9 oz (84.9 kg)    Physical Exam  Constitutional: He is oriented to person, place, and time. No distress.  HENT:  Mouth/Throat: Oropharynx is clear and moist. No oropharyngeal exudate.  Eyes: Conjunctivae are normal. No scleral icterus.  Neck: Normal range of motion. Neck supple. No JVD present. No thyromegaly present.  Cardiovascular: Normal rate, regular rhythm and normal heart sounds.  No murmur heard. Pulmonary/Chest: Effort normal and breath sounds normal. No respiratory distress. He has no wheezes. He has no rales. Right breast exhibits mass. Right breast exhibits no inverted nipple, no nipple discharge, no skin change and no tenderness. Left breast exhibits no inverted  nipple, no mass, no nipple discharge, no skin change and no tenderness.    Abdominal: Soft. Bowel sounds are normal. He exhibits no mass. There is no tenderness.  Musculoskeletal: Normal range of motion. He exhibits no edema, tenderness or deformity.  Lymphadenopathy:    He has no cervical adenopathy.  Neurological: He is alert and oriented to person, place, and time.  Skin: No rash noted. He is not diaphoretic. No erythema.  Vitals reviewed.   Lab Results  Component Value Date   WBC 6.8 06/14/2018   HGB 11.5 (L) 06/14/2018   HCT 34.1 (L) 06/14/2018   PLT 166.0 06/14/2018    GLUCOSE 109 (H) 06/14/2018   CHOL 113 07/30/2017   TRIG 95.0 07/30/2017   HDL 30.60 (L) 07/30/2017   LDLCALC 63 07/30/2017   ALT 70 (H) 05/30/2018   AST 129 (H) 05/30/2018   NA 142 06/14/2018   K 4.3 06/14/2018   CL 108 06/14/2018   CREATININE 1.92 (H) 06/14/2018   BUN 26 (H) 06/14/2018   CO2 24 06/14/2018   TSH 3.45 06/14/2018   INR 1.00 09/15/2017   HGBA1C 5.6 07/23/2016    Ir Catheter Tube Change  Result Date: 10/15/2018 INDICATION: Drain removed EXAM: PELVIC ABSCESS DRAIN REPLACEMENT MEDICATIONS: The patient is currently admitted to the hospital and receiving intravenous antibiotics. The antibiotics were administered within an appropriate time frame prior to the initiation of the procedure. ANESTHESIA/SEDATION: None COMPLICATIONS: None immediate. PROCEDURE: Informed written consent was obtained from the patient after a thorough discussion of the procedural risks, benefits and alternatives. All questions were addressed. Maximal Sterile Barrier Technique was utilized including caps, mask, sterile gowns, sterile gloves, sterile drape, hand hygiene and skin antiseptic. A timeout was performed prior to the initiation of the procedure. The lower abdomen was prepped and draped in a sterile fashion. 1% lidocaine was utilized for local anesthesia. A Kumpe catheter was inserted into the drain entry site. It was advanced over a Bentson wire into the abscess. Contrast was injected filling the small abscess cavity, a fistula, and adjacent bowel. The Kumpe catheter was exchanged over an Amplatz for a 14 [place] drain. It was looped and string fixed in the small abscess cavity. Contrast was injected. It was sewn to the skin. FINDINGS: Imaging confirms replacement of the 5 French abscess drain positioned in the small abscess cavity. There is a fistula to adjacent bowel. IMPRESSION: Successful 14 French pelvic abscess drain replacement. Electronically Signed   By: Marybelle Killings M.D.   On: 10/15/2018 15:11     Microscopic Comment The core biopsies show breast tissue with a bland spindle cell proliferation with interlacing fascicles of elongated cells without atypia, mitotic activity or necrosis. Immunohistochemistry shows positivity with Vimentin, Desmin, estrogen receptor, progesterone receptor, CD34, epithelial membrane antigen and patchy positivity with smooth muscle actin. The spindle cells are negative with CD117, p63, CD99, cytokeratin AE1/AE3, D2-40, S-100, neuron specific Enolase, muscle specific actin and Calponin. The differential diagnosis includes myofibroblastoma and a hamartomatous lesion. (JDP:ds 12/25/15)  Assessment & Plan:   Kainen was seen today for breast mass.  Diagnoses and all orders for this visit:  Need for influenza vaccination -     Flu vaccine HIGH DOSE PF (Fluzone High dose)  Mass of breast, right- I have recommended that he undergo another ultrasound and possible biopsy.  This is to see if this has transitioned to a malignant lesion.  Based on those results I will consider referral to general surgery to see if the mass  needs to be excised. -     US BREAST LTD UNI RIGHT INC AXILLA; Future   I am having Matilde Bash maintain his triamcinolone cream, Colchicine, aspirin, clopidogrel, folic acid, ferrous sulfate, thiamine, dronabinol, levothyroxine, benazepril, and simvastatin.  No orders of the defined types were placed in this encounter.    Follow-up: Return if symptoms worsen or fail to improve.  Scarlette Calico, MD

## 2018-11-17 NOTE — Addendum Note (Signed)
Addended by: Karle Barr on: 11/17/2018 04:25 PM   Modules accepted: Orders

## 2018-11-17 NOTE — Patient Instructions (Signed)
Breast Cancer, Male Breast cancer is an abnormal growth of tissue (tumor) in the breast that is cancerous (malignant). Unlike noncancerous (benign) tumors, malignant tumors can spread to other parts of your body. Men have breast tissue and can get breast cancer. What are the causes? The cause of male breast cancer is unknown. What increases the risk?  Age. Most cases of male breast cancer occur in men between the ages of 77 years and 59 years.  Family history of breast cancer.  Having the BRCA1 and BRCA2 genes.  Personal history of radiation exposure.  Using drugs that contain estrogen.  Scarring of the liver (cirrhosis).  Obesity.  Having the genetic disorder Klinefelter syndrome. What are the signs or symptoms?  A painless lump in the breast.  Breast skin changes, such as puckering or dimpling.  Nipple abnormalities, such as scaling, crustiness, redness, or pulling in (retraction).  Nipple discharge that is bloody or clear. How is this diagnosed? Your health care provider will ask about your medical history. He or she may also perform a number of procedures, such as:  A physical exam. This will involve feeling the tissue around the breast and under the arms.  Taking a sample of any nipple discharge. The sample will be examined under a microscope.  Breast X-rays (mammogram) and breast ultrasound exams.  Taking a tissue sample (biopsy) from the breast. The sample will be examined under a microscope to look for cancer cells.  Your cancer will be staged to determine its severity and extent. Staging is a careful attempt to find out the size of the tumor, whether the cancer has spread, and if so, to what parts of the body. You may need to have more tests to determine the stage of your cancer:  Stage 0-The tumor has not spread to other breast tissue.  Stage I-The cancer is only found in the breast. The tumor may be up to  in (2 cm) wide.  Stage II-The cancer has spread to  nearby lymph nodes. The tumor may be up to 2 in (5 cm) wide.  Stage III-The cancer has spread to more distant lymph nodes. The tumor may be larger than 2 in (5 cm) wide.  Stage IV-The cancer has spread to other parts of the body, such as the bones, brain, liver, or lungs.  How is this treated? Depending on the type and stage, male breast cancer may be treated with one or more of the following therapies:  Surgery to remove all the breast tissue, including the nipple, and the lymph nodes under the arm.  Radiation therapy, which uses high-energy rays to kill cancer cells.  Chemotherapy, which is the use of drugs to kill cancer cells.  Hormonal therapy, which involves taking medicine to adjust the hormone levels in your body. You may take medicine to decrease your estrogen levels. This can help stop cancer cells from growing.  Follow these instructions at home:   Take medicines only as directed by your health care provider.  Maintain a healthy diet.  Consider joining a support group. This may help you learn to cope with the stress of having breast cancer.  Keep all follow-up appointments as directed by your health care provider. Contact a health care provider if:  You have a sudden increase in pain.  You notice a new lump in either breast or under your arm.  You develop swelling in either arm or hand.  You lose weight without trying.  You have a fever.  You  notice new fatigue or weakness. Get help right away if:  You have chest pain or trouble breathing.  You faint. This information is not intended to replace advice given to you by your health care provider. Make sure you discuss any questions you have with your health care provider. Document Released: 11/27/2008 Document Revised: 05/22/2016 Document Reviewed: 02/09/2014 Elsevier Interactive Patient Education  [employer].

## 2018-11-22 ENCOUNTER — Other Ambulatory Visit: Payer: Self-pay | Admitting: Internal Medicine

## 2018-11-22 DIAGNOSIS — N6310 Unspecified lump in the right breast, unspecified quadrant: Secondary | ICD-10-CM

## 2018-11-26 ENCOUNTER — Other Ambulatory Visit: Payer: Self-pay | Admitting: Internal Medicine

## 2018-11-26 DIAGNOSIS — M10071 Idiopathic gout, right ankle and foot: Secondary | ICD-10-CM

## 2018-12-01 ENCOUNTER — Ambulatory Visit
Admission: RE | Admit: 2018-12-01 | Discharge: 2018-12-01 | Disposition: A | Discharge status: Home / Self Care | Payer: Medicare Other | Source: Ambulatory Visit | Attending: Internal Medicine | Admitting: Internal Medicine

## 2018-12-01 ENCOUNTER — Other Ambulatory Visit: Payer: Self-pay | Admitting: Internal Medicine

## 2018-12-01 DIAGNOSIS — N6310 Unspecified lump in the right breast, unspecified quadrant: Secondary | ICD-10-CM

## 2018-12-08 ENCOUNTER — Other Ambulatory Visit: Payer: Self-pay | Admitting: Internal Medicine

## 2018-12-08 DIAGNOSIS — D520 Dietary folate deficiency anemia: Secondary | ICD-10-CM

## 2018-12-08 DIAGNOSIS — D508 Other iron deficiency anemias: Secondary | ICD-10-CM

## 2018-12-30 ENCOUNTER — Other Ambulatory Visit: Payer: Self-pay | Admitting: Internal Medicine

## 2018-12-30 DIAGNOSIS — E039 Hypothyroidism, unspecified: Secondary | ICD-10-CM

## 2019-01-02 ENCOUNTER — Other Ambulatory Visit: Payer: Self-pay | Admitting: Internal Medicine

## 2019-01-02 DIAGNOSIS — F04 Amnestic disorder due to known physiological condition: Principal | ICD-10-CM

## 2019-01-02 DIAGNOSIS — E538 Deficiency of other specified B group vitamins: Secondary | ICD-10-CM

## 2019-01-11 ENCOUNTER — Other Ambulatory Visit: Payer: Self-pay | Admitting: Internal Medicine

## 2019-01-11 DIAGNOSIS — I69398 Other sequelae of cerebral infarction: Secondary | ICD-10-CM

## 2019-01-11 DIAGNOSIS — I6300 Cerebral infarction due to thrombosis of unspecified precerebral artery: Secondary | ICD-10-CM

## 2019-01-11 DIAGNOSIS — H539 Unspecified visual disturbance: Secondary | ICD-10-CM

## 2019-01-26 ENCOUNTER — Other Ambulatory Visit (HOSPITAL_COMMUNITY): Payer: Self-pay | Admitting: Interventional Radiology

## 2019-01-26 ENCOUNTER — Ambulatory Visit (HOSPITAL_COMMUNITY)
Admission: RE | Admit: 2019-01-26 | Discharge: 2019-01-26 | Disposition: A | Discharge status: Home / Self Care | Payer: Medicare Other | Source: Ambulatory Visit | Attending: Interventional Radiology | Admitting: Interventional Radiology

## 2019-01-26 ENCOUNTER — Encounter (HOSPITAL_COMMUNITY): Payer: Self-pay | Admitting: Interventional Radiology

## 2019-01-26 DIAGNOSIS — Z4803 Encounter for change or removal of drains: Secondary | ICD-10-CM | POA: Diagnosis not present

## 2019-01-26 DIAGNOSIS — K651 Peritoneal abscess: Secondary | ICD-10-CM | POA: Insufficient documentation

## 2019-01-26 HISTORY — PX: IR CATHETER TUBE CHANGE: IMG717

## 2019-01-26 MED ORDER — IOPAMIDOL (ISOVUE-300) INJECTION 61%
INTRAVENOUS | Status: AC
  Administered 2019-01-26: 5 mL
  Filled 2019-01-26: qty 50

## 2019-01-26 MED ORDER — LIDOCAINE HCL (PF) 1 % IJ SOLN
INTRAMUSCULAR | Status: DC | PRN
  Administered 2019-01-26: 5 mL

## 2019-01-26 MED ORDER — LIDOCAINE HCL 1 % IJ SOLN
INTRAMUSCULAR | Status: AC
  Filled 2019-01-26: qty 20

## 2019-01-31 ENCOUNTER — Encounter: Payer: Self-pay | Admitting: Internal Medicine

## 2019-02-01 ENCOUNTER — Telehealth: Payer: Self-pay | Admitting: Internal Medicine

## 2019-02-01 NOTE — Telephone Encounter (Signed)
Copied from [place] [id number]. Topic: General - Other >> Feb 01, 2019  1:04 PM Keene Breath wrote: Reason for CRM: Patient's daughter called to speak with the doctor or nurse regarding her father's health.  Please call back as soon as possible.  CB# [phone] or [phone]

## 2019-03-11 ENCOUNTER — Ambulatory Visit: Payer: Self-pay | Admitting: Internal Medicine

## 2019-03-11 NOTE — Telephone Encounter (Signed)
Pt.'s daughter called to reports they have noticed pt. Has been weaker for "about a week." Reports his appetite is "not too good, but he is drinking fluids." Had "some diarrhea a while back, but he was eating a lot of ice cream." Ambulates with his walker per daughter. Appointment made for next week as requested - pt. Put on wait list. Instructed daughter to call back if pt.'d symptoms worsen.Verbalizes understanding.  Reason for Disposition . [1] MODERATE weakness (i.e., interferes with work, school, normal activities) AND [2] persists > 3 days  Answer Assessment - Initial Assessment Questions 1. DESCRIPTION: "Describe how you are feeling."     Weak 2. SEVERITY: "How bad is it?"  "Can you stand and walk?"   - MILD - Feels weak or tired, but does not interfere with work, school or normal activities   - [place] to stand and walk; weakness interferes with work, school, or normal activities   - SEVERE - Unable to stand or walk     Moderate 3. ONSET:  "When did the weakness begin?"     Started 1 week ago 4. CAUSE: "What do you think is causing the weakness?"     Unsure 5. MEDICINES: "Have you recently started a new medicine or had a change in the amount of a medicine?"     No new medicines 6. OTHER SYMPTOMS: "Do you have any other symptoms?" (e.g., chest pain, fever, cough, SOB, vomiting, diarrhea, bleeding, other areas of pain)     Just increased weakness 7. PREGNANCY: "Is there any chance you are pregnant?" "When was your last menstrual period?"     n/a  Protocols used: WEAKNESS (GENERALIZED) AND FATIGUE-A-AH

## 2019-03-17 ENCOUNTER — Encounter (HOSPITAL_COMMUNITY): Payer: Self-pay

## 2019-03-17 ENCOUNTER — Inpatient Hospital Stay (HOSPITAL_COMMUNITY): Payer: Medicare Other

## 2019-03-17 ENCOUNTER — Emergency Department (HOSPITAL_COMMUNITY): Payer: Medicare Other

## 2019-03-17 ENCOUNTER — Other Ambulatory Visit: Payer: Self-pay

## 2019-03-17 ENCOUNTER — Ambulatory Visit: Payer: Medicare Other | Admitting: Internal Medicine

## 2019-03-17 ENCOUNTER — Inpatient Hospital Stay (HOSPITAL_COMMUNITY)
Admission: EM | Admit: 2019-03-17 | Discharge: 2019-03-24 | DRG: 682 | Disposition: A | Discharge status: Home Health | Payer: Medicare Other | Attending: Internal Medicine | Admitting: Internal Medicine

## 2019-03-17 DIAGNOSIS — I214 Non-ST elevation (NSTEMI) myocardial infarction: Secondary | ICD-10-CM | POA: Diagnosis not present

## 2019-03-17 DIAGNOSIS — Z515 Encounter for palliative care: Secondary | ICD-10-CM | POA: Diagnosis not present

## 2019-03-17 DIAGNOSIS — H543 Unqualified visual loss, both eyes: Secondary | ICD-10-CM | POA: Diagnosis present

## 2019-03-17 DIAGNOSIS — R1312 Dysphagia, oropharyngeal phase: Secondary | ICD-10-CM | POA: Diagnosis present

## 2019-03-17 DIAGNOSIS — N179 Acute kidney failure, unspecified: Secondary | ICD-10-CM | POA: Diagnosis present

## 2019-03-17 DIAGNOSIS — Z6828 Body mass index (BMI) 28.0-28.9, adult: Secondary | ICD-10-CM

## 2019-03-17 DIAGNOSIS — R5381 Other malaise: Secondary | ICD-10-CM | POA: Diagnosis present

## 2019-03-17 DIAGNOSIS — F0390 Unspecified dementia without behavioral disturbance: Secondary | ICD-10-CM | POA: Diagnosis not present

## 2019-03-17 DIAGNOSIS — Z79899 Other long term (current) drug therapy: Secondary | ICD-10-CM

## 2019-03-17 DIAGNOSIS — I248 Other forms of acute ischemic heart disease: Secondary | ICD-10-CM | POA: Diagnosis present

## 2019-03-17 DIAGNOSIS — D631 Anemia in chronic kidney disease: Secondary | ICD-10-CM | POA: Diagnosis present

## 2019-03-17 DIAGNOSIS — R7989 Other specified abnormal findings of blood chemistry: Secondary | ICD-10-CM | POA: Diagnosis not present

## 2019-03-17 DIAGNOSIS — Z87891 Personal history of nicotine dependence: Secondary | ICD-10-CM

## 2019-03-17 DIAGNOSIS — I739 Peripheral vascular disease, unspecified: Secondary | ICD-10-CM | POA: Diagnosis present

## 2019-03-17 DIAGNOSIS — E875 Hyperkalemia: Secondary | ICD-10-CM | POA: Diagnosis present

## 2019-03-17 DIAGNOSIS — R197 Diarrhea, unspecified: Secondary | ICD-10-CM | POA: Diagnosis not present

## 2019-03-17 DIAGNOSIS — Z8673 Personal history of transient ischemic attack (TIA), and cerebral infarction without residual deficits: Secondary | ICD-10-CM | POA: Diagnosis not present

## 2019-03-17 DIAGNOSIS — E860 Dehydration: Secondary | ICD-10-CM | POA: Diagnosis present

## 2019-03-17 DIAGNOSIS — R4182 Altered mental status, unspecified: Secondary | ICD-10-CM

## 2019-03-17 DIAGNOSIS — N184 Chronic kidney disease, stage 4 (severe): Secondary | ICD-10-CM | POA: Diagnosis present

## 2019-03-17 DIAGNOSIS — Z79890 Hormone replacement therapy (postmenopausal): Secondary | ICD-10-CM

## 2019-03-17 DIAGNOSIS — E785 Hyperlipidemia, unspecified: Secondary | ICD-10-CM | POA: Diagnosis present

## 2019-03-17 DIAGNOSIS — I2510 Atherosclerotic heart disease of native coronary artery without angina pectoris: Secondary | ICD-10-CM | POA: Diagnosis not present

## 2019-03-17 DIAGNOSIS — E872 Acidosis: Secondary | ICD-10-CM | POA: Diagnosis present

## 2019-03-17 DIAGNOSIS — R269 Unspecified abnormalities of gait and mobility: Secondary | ICD-10-CM | POA: Diagnosis present

## 2019-03-17 DIAGNOSIS — F0391 Unspecified dementia with behavioral disturbance: Secondary | ICD-10-CM | POA: Diagnosis present

## 2019-03-17 DIAGNOSIS — G9341 Metabolic encephalopathy: Secondary | ICD-10-CM | POA: Diagnosis present

## 2019-03-17 DIAGNOSIS — Z7189 Other specified counseling: Secondary | ICD-10-CM | POA: Diagnosis not present

## 2019-03-17 DIAGNOSIS — J690 Pneumonitis due to inhalation of food and vomit: Secondary | ICD-10-CM | POA: Diagnosis present

## 2019-03-17 DIAGNOSIS — E039 Hypothyroidism, unspecified: Secondary | ICD-10-CM | POA: Diagnosis present

## 2019-03-17 DIAGNOSIS — R6520 Severe sepsis without septic shock: Secondary | ICD-10-CM

## 2019-03-17 DIAGNOSIS — E43 Unspecified severe protein-calorie malnutrition: Secondary | ICD-10-CM | POA: Diagnosis present

## 2019-03-17 DIAGNOSIS — D520 Dietary folate deficiency anemia: Secondary | ICD-10-CM

## 2019-03-17 DIAGNOSIS — N400 Enlarged prostate without lower urinary tract symptoms: Secondary | ICD-10-CM | POA: Diagnosis present

## 2019-03-17 DIAGNOSIS — I10 Essential (primary) hypertension: Secondary | ICD-10-CM | POA: Diagnosis not present

## 2019-03-17 DIAGNOSIS — I700 Atherosclerosis of aorta: Secondary | ICD-10-CM | POA: Diagnosis present

## 2019-03-17 DIAGNOSIS — I959 Hypotension, unspecified: Secondary | ICD-10-CM | POA: Diagnosis present

## 2019-03-17 DIAGNOSIS — E870 Hyperosmolality and hypernatremia: Secondary | ICD-10-CM | POA: Diagnosis present

## 2019-03-17 DIAGNOSIS — K632 Fistula of intestine: Secondary | ICD-10-CM | POA: Diagnosis present

## 2019-03-17 DIAGNOSIS — A419 Sepsis, unspecified organism: Secondary | ICD-10-CM

## 2019-03-17 DIAGNOSIS — R778 Other specified abnormalities of plasma proteins: Secondary | ICD-10-CM | POA: Diagnosis present

## 2019-03-17 DIAGNOSIS — I129 Hypertensive chronic kidney disease with stage 1 through stage 4 chronic kidney disease, or unspecified chronic kidney disease: Secondary | ICD-10-CM | POA: Diagnosis present

## 2019-03-17 DIAGNOSIS — R509 Fever, unspecified: Secondary | ICD-10-CM | POA: Diagnosis present

## 2019-03-17 DIAGNOSIS — A040 Enteropathogenic Escherichia coli infection: Secondary | ICD-10-CM | POA: Diagnosis present

## 2019-03-17 DIAGNOSIS — N321 Vesicointestinal fistula: Secondary | ICD-10-CM | POA: Diagnosis present

## 2019-03-17 DIAGNOSIS — Z7902 Long term (current) use of antithrombotics/antiplatelets: Secondary | ICD-10-CM

## 2019-03-17 DIAGNOSIS — Z7982 Long term (current) use of aspirin: Secondary | ICD-10-CM

## 2019-03-17 DIAGNOSIS — I714 Abdominal aortic aneurysm, without rupture: Secondary | ICD-10-CM | POA: Diagnosis present

## 2019-03-17 DIAGNOSIS — R0600 Dyspnea, unspecified: Secondary | ICD-10-CM

## 2019-03-17 DIAGNOSIS — F03B0 Unspecified dementia, moderate, without behavioral disturbance, psychotic disturbance, mood disturbance, and anxiety: Secondary | ICD-10-CM | POA: Diagnosis present

## 2019-03-17 DIAGNOSIS — D696 Thrombocytopenia, unspecified: Secondary | ICD-10-CM | POA: Diagnosis present

## 2019-03-17 DIAGNOSIS — D539 Nutritional anemia, unspecified: Secondary | ICD-10-CM | POA: Diagnosis present

## 2019-03-17 DIAGNOSIS — E869 Volume depletion, unspecified: Secondary | ICD-10-CM | POA: Diagnosis present

## 2019-03-17 DIAGNOSIS — Z931 Gastrostomy status: Secondary | ICD-10-CM

## 2019-03-17 LAB — COMPREHENSIVE METABOLIC PANEL
ALT: 36 U/L (ref 0–44)
AST: 61 U/L — ABNORMAL HIGH (ref 15–41)
Albumin: 2.6 g/dL — ABNORMAL LOW (ref 3.5–5.0)
Alkaline Phosphatase: 68 U/L (ref 38–126)
Anion gap: 14 (ref 5–15)
BILIRUBIN TOTAL: 0.5 mg/dL (ref 0.3–1.2)
BUN: 120 mg/dL — ABNORMAL HIGH (ref 8–23)
CO2: 17 mmol/L — ABNORMAL LOW (ref 22–32)
Calcium: 8.3 mg/dL — ABNORMAL LOW (ref 8.9–10.3)
Chloride: 116 mmol/L — ABNORMAL HIGH (ref 98–111)
Creatinine, Ser: 6.62 mg/dL — ABNORMAL HIGH (ref 0.61–1.24)
GFR calc Af Amer: 8 mL/min — ABNORMAL LOW (ref >60)
GFR calc non Af Amer: 7 mL/min — ABNORMAL LOW (ref >60)
Glucose, Bld: 98 mg/dL (ref 70–99)
POTASSIUM: 5.4 mmol/L — AB (ref 3.5–5.1)
Sodium: 147 mmol/L — ABNORMAL HIGH (ref 135–145)
TOTAL PROTEIN: 7.1 g/dL (ref 6.5–8.1)

## 2019-03-17 LAB — CBC WITH DIFFERENTIAL/PLATELET
ABS IMMATURE GRANULOCYTES: 0.27 10*3/uL — AB (ref 0.00–0.07)
BASOS PCT: 2 %
Basophils Absolute: 0.1 10*3/uL (ref 0.0–0.1)
Eosinophils Absolute: 0.1 10*3/uL (ref 0.0–0.5)
Eosinophils Relative: 2 %
HCT: 40.4 % (ref 39.0–52.0)
Hemoglobin: 11.8 g/dL — ABNORMAL LOW (ref 13.0–17.0)
Immature Granulocytes: 4 %
Lymphocytes Relative: 8 %
Lymphs Abs: 0.6 10*3/uL — ABNORMAL LOW (ref 0.7–4.0)
MCH: 32.1 pg (ref 26.0–34.0)
MCHC: 29.2 g/dL — ABNORMAL LOW (ref 30.0–36.0)
MCV: 109.8 fL — ABNORMAL HIGH (ref 80.0–100.0)
Monocytes Absolute: 1.4 10*3/uL — ABNORMAL HIGH (ref 0.1–1.0)
Monocytes Relative: 18 %
NEUTROS ABS: 5.1 10*3/uL (ref 1.7–7.7)
Neutrophils Relative %: 66 %
Platelets: 191 10*3/uL (ref 150–400)
RBC: 3.68 MIL/uL — ABNORMAL LOW (ref 4.22–5.81)
RDW: 18.1 % — ABNORMAL HIGH (ref 11.5–15.5)
WBC: 7.6 10*3/uL (ref 4.0–10.5)
nRBC: 0 % (ref 0.0–0.2)

## 2019-03-17 LAB — URINALYSIS, MICROSCOPIC (REFLEX)
RBC / HPF: >50 RBC/hpf (ref 0–5)
Squamous Epithelial / HPF: NONE SEEN (ref 0–5)
WBC, UA: >50 WBC/hpf (ref 0–5)

## 2019-03-17 LAB — URINALYSIS, ROUTINE W REFLEX MICROSCOPIC

## 2019-03-17 LAB — LIPASE, BLOOD: Lipase: 72 U/L — ABNORMAL HIGH (ref 11–51)

## 2019-03-17 LAB — LACTIC ACID, PLASMA
LACTIC ACID, VENOUS: 2.4 mmol/L — AB (ref 0.5–1.9)
Lactic Acid, Venous: 2.3 mmol/L (ref 0.5–1.9)

## 2019-03-17 MED ORDER — METRONIDAZOLE IN NACL 5-0.79 MG/ML-% IV SOLN
500.0000 mg | Freq: Three times a day (TID) | INTRAVENOUS | Status: DC
  Administered 2019-03-18 (×3): 500 mg via INTRAVENOUS
  Filled 2019-03-17 (×4): qty 100

## 2019-03-17 MED ORDER — ONDANSETRON HCL 4 MG PO TABS: 4.0000 mg | ORAL_TABLET | Freq: Four times a day (QID) | ORAL | Status: DC | PRN

## 2019-03-17 MED ORDER — LEVOTHYROXINE SODIUM 50 MCG PO TABS
50.0000 ug | ORAL_TABLET | Freq: Every day | ORAL | Status: DC
  Administered 2019-03-18 – 2019-03-24 (×6): 50 ug via ORAL
  Filled 2019-03-17 (×6): qty 1

## 2019-03-17 MED ORDER — SODIUM CHLORIDE 0.9 % IV BOLUS (SEPSIS)
1000.0000 mL | Freq: Once | INTRAVENOUS | Status: AC
  Administered 2019-03-17: 1000 mL via INTRAVENOUS

## 2019-03-17 MED ORDER — SODIUM CHLORIDE 0.9 % IV BOLUS
500.0000 mL | Freq: Once | INTRAVENOUS | Status: AC
  Administered 2019-03-17: 500 mL via INTRAVENOUS

## 2019-03-17 MED ORDER — SODIUM CHLORIDE 0.45 % IV SOLN
INTRAVENOUS | Status: DC
  Administered 2019-03-17: via INTRAVENOUS

## 2019-03-17 MED ORDER — ASPIRIN EC 81 MG PO TBEC
81.0000 mg | DELAYED_RELEASE_TABLET | Freq: Every day | ORAL | Status: DC
  Administered 2019-03-18 – 2019-03-24 (×6): 81 mg via ORAL
  Filled 2019-03-17 (×6): qty 1

## 2019-03-17 MED ORDER — ONDANSETRON HCL 4 MG/2ML IJ SOLN: 4.0000 mg | Freq: Four times a day (QID) | INTRAMUSCULAR | Status: DC | PRN

## 2019-03-17 MED ORDER — SODIUM CHLORIDE 0.9 % IV SOLN
2.0000 g | INTRAVENOUS | Status: DC
  Administered 2019-03-17 – 2019-03-18 (×2): 2 g via INTRAVENOUS
  Filled 2019-03-17 (×3): qty 20

## 2019-03-17 MED ORDER — SODIUM CHLORIDE 0.9 % IV SOLN
2.0000 g | Freq: Once | INTRAVENOUS | Status: AC
  Administered 2019-03-17: 2 g via INTRAVENOUS
  Filled 2019-03-17: qty 2

## 2019-03-17 MED ORDER — ACETAMINOPHEN 650 MG RE SUPP
650.0000 mg | Freq: Four times a day (QID) | RECTAL | Status: DC | PRN
  Administered 2019-03-18 (×2): 650 mg via RECTAL
  Filled 2019-03-17 (×2): qty 1

## 2019-03-17 MED ORDER — CLOPIDOGREL BISULFATE 75 MG PO TABS
75.0000 mg | ORAL_TABLET | Freq: Every day | ORAL | Status: DC
  Administered 2019-03-18 – 2019-03-24 (×6): 75 mg via ORAL
  Filled 2019-03-17 (×6): qty 1

## 2019-03-17 MED ORDER — METRONIDAZOLE IN NACL 5-0.79 MG/ML-% IV SOLN
500.0000 mg | Freq: Once | INTRAVENOUS | Status: AC
  Administered 2019-03-17: 500 mg via INTRAVENOUS
  Filled 2019-03-17: qty 100

## 2019-03-17 MED ORDER — ENOXAPARIN SODIUM 30 MG/0.3ML ~~LOC~~ SOLN
30.0000 mg | SUBCUTANEOUS | Status: DC
  Administered 2019-03-17: 30 mg via SUBCUTANEOUS
  Filled 2019-03-17: qty 0.3

## 2019-03-17 MED ORDER — ACETAMINOPHEN 325 MG PO TABS: 650.0000 mg | ORAL_TABLET | Freq: Four times a day (QID) | ORAL | Status: DC | PRN

## 2019-03-17 NOTE — ED Notes (Signed)
Date and time results received: 03/17/19 1737  Test: Lactic Acid Critical Value: 2.4  Name of Provider Notified: Dr. Melina Copa  Orders Received? Or Actions Taken?: See chart

## 2019-03-17 NOTE — H&P (Signed)
History and Physical    Robert Barnett NMM:[id number] DOB: March 19, 1932 DOA: 03/17/2019  PCP: Janith Lima, MD   Patient coming from: [facility]  I have personally briefly reviewed patient's old medical records in [place]  Chief Complaint: Altered metal status, Diarrhea  HPI: Robert Barnett is a 85 y.o. male with medical history significant for dementia, CKD4, AAA, BPH, hypothyrodism, who was bought to the Ed with c/o mutiple episodes of loose stool and generalized weakness over the past week.  Today patient was not responding communicating as he used to.  History is obtained from daughter who is at bedside.  Patient do awake and alert now with improvement in mental status, is not cooperating and is unable to give me a history.  Daughter denies abdominal pain, reports patient complained of pain in his rectum.   No difficulty breathing normal, no cough, no fever or chills, no sick contacts, no recent travels.  At baseline patient has dementia, most times he recognizes close family members, answers simple questions, but needs assistance with most ADLs.  He is able to ambulate without assistance. On EMS arrival blood pressure 60/40.  ED Course: Blood pressure down to 89/59, temp 100.4, intermittent tachypnea to 28.  Lactic acid 2.4.  Creatinine elevated 6.6, from baseline 1.9-2L.  Analysis of UA limited by red color. 3 L bolus given IV cefepime and metronidazole given.  Blood cultures obtained.  GI pathogen panel and stool C. difficile ordered.  Portable chest x-ray negative for negative for acute abnormality. EDP talked to nephrologist on-call who recommended admission to hospitalist service, IV fluids and hospitalist to consult in a.m. if needed.  Hospitalist to admit for acute kidney injury and hypotension.  Review of Systems: As per HPI all other systems reviewed and negative.  Past Medical History:  Diagnosis Date  . Arthritis   . BPH (benign prostatic hyperplasia)   .  Carotid artery occlusion   . Dementia ([place])   . Dysphagia   . Hyperlipidemia   . Hypertension   . Peripheral vascular disease ([place])   . Stroke [facility])    Right hemispheric CVA  . Umbilical hernia     Past Surgical History:  Procedure Laterality Date  . CAROTID ENDARTERECTOMY  10/06/2007   Right CEA by Dr. Amedeo Plenty  . IR CATHETER TUBE CHANGE  09/15/2017  . IR CATHETER TUBE CHANGE  12/03/2017  . IR CATHETER TUBE CHANGE  12/23/2017  . IR CATHETER TUBE CHANGE  05/07/2018  . IR CATHETER TUBE CHANGE  06/29/2018  . IR CATHETER TUBE CHANGE  08/18/2018  . IR CATHETER TUBE CHANGE  09/08/2018  . IR CATHETER TUBE CHANGE  10/15/2018  . IR CATHETER TUBE CHANGE  01/26/2019  . IR GENERIC HISTORICAL  07/09/2016   IR RADIOLOGIST EVAL & MGMT 07/09/2016 Arne Cleveland, MD [facility] INTERV RAD  . IR RADIOLOGIST EVAL & MGMT  09/15/2017  . IR RADIOLOGIST EVAL & MGMT  05/06/2018  . PEG TUBE PLACEMENT  2008   after CVA     reports that he has quit smoking. He has never used smokeless tobacco. He reports that he does not drink alcohol or use drugs.  No Known Allergies  Family History  Problem Relation Age of Onset  . Obesity Daughter     Prior to Admission medications   Medication Sig Start Date End Date Taking? Authorizing Provider  aspirin 81 MG tablet Take 1 tablet (81 mg total) by mouth daily. 06/14/18  Yes Janith Lima, MD  benazepril (LOTENSIN) 40 MG tablet TAKE 1 TABLET BY MOUTH EVERY DAY 09/18/18  Yes Janith Lima, MD  clopidogrel (PLAVIX) 75 MG tablet TAKE 1 TABLET BY MOUTH EVERY DAY 01/11/19  Yes Janith Lima, MD  Colchicine 0.6 MG CAPS TAKE 1 CAPSULE BY MOUTH TWICE A DAY 11/26/18  Yes Janith Lima, MD  donepezil (ARICEPT) 10 MG tablet Take 1 tablet by mouth at bedtime. 12/28/18  Yes [provider]  dronabinol (MARINOL) 2.5 MG capsule Take 1 capsule (2.5 mg total) by mouth 2 (two) times daily before lunch and supper. 08/10/18  Yes Janith Lima, MD  ferrous sulfate 325 (65 FE) MG EC  tablet TAKE 1 TABLET BY MOUTH EVERY DAY WITH BREAKFAST 12/08/18  Yes Janith Lima, MD  levothyroxine (SYNTHROID, LEVOTHROID) 50 MCG tablet TAKE 1 TABLET (50 MCG TOTAL) BY MOUTH DAILY. 12/30/18  Yes Janith Lima, MD  simvastatin (ZOCOR) 20 MG tablet Take 1 tablet (20 mg total) by mouth daily. 10/01/18  Yes Janith Lima, MD  thiamine 100 MG tablet TAKE 1 TABLET BY MOUTH EVERY DAY 01/03/19  Yes Janith Lima, MD  triamcinolone cream (KENALOG) 0.5 % Apply 1 application topically 2 (two) times daily. 03/31/18  Yes Janith Lima, MD  folic acid (FOLVITE) 1 MG tablet TAKE 1 TABLET BY MOUTH EVERY DAY Patient not taking: Reported on 03/17/2019 12/08/18   Janith Lima, MD    Physical Exam: Vitals:   03/17/19 1800 03/17/19 1815 03/17/19 1830 03/17/19 1900  BP: (!) 117/42  129/63 134/61  Pulse:  77 78 (!) 25  Resp: (!) 23 (!) 23 17 (!) 26  Temp:      TempSrc:      SpO2:  100% 100% (!) 79%    Constitutional: Refusing to cooperate with exam, awake and alert, vocalizing,  Vitals:   03/17/19 1800 03/17/19 1815 03/17/19 1830 03/17/19 1900  BP: (!) 117/42  129/63 134/61  Pulse:  77 78 (!) 25  Resp: (!) 23 (!) 23 17 (!) 26  Temp:      TempSrc:      SpO2:  100% 100% (!) 79%   Eyes: Unable to examine patient not cooperative ENMT: Mucous membranes are dry.  Unable to examine posterior pharynx Neck: normal, supple, no masses, no thyromegaly Respiratory: clear to auscultation bilaterally, no wheezing, no crackles. Normal respiratory effort. No accessory muscle use.  Cardiovascular: Regular rate and rhythm, no murmurs / rubs / gallops. No extremity edema. 2+ pedal pulses.   Abdomen: Pigtail catheter left lower quadrant with bag draining black fluid, foul-smelling, but abdomen is not tender. Daughter tells me catheter has been there for 5 to 6 years, foul smell is chronic and unchanged, patient regularly follows with IR last visit a month ago. No masses palpated. No hepatosplenomegaly. Bowel sounds  positive.  Musculoskeletal: no clubbing / cyanosis. No joint deformity upper and lower extremities. Good ROM, no contractures. Normal muscle tone.  Skin: no rashes, lesions, ulcers. No induration Neurologic: CN 2-12 grossly intact.  Strength 5/5 in all 4.  Psychiatric: Normal judgment and insight. Alert and oriented x 3. Normal mood.   Labs on Admission: I have personally reviewed following labs and imaging studies  CBC: Recent Labs  Lab 03/17/19 1625  WBC 7.6  NEUTROABS 5.1  HGB 11.8*  HCT 40.4  MCV 109.8*  PLT 417   Basic Metabolic Panel: Recent Labs  Lab 03/17/19 1625  NA 147*  K 5.4*  CL 116*  CO2 17*  GLUCOSE 98  BUN 120*  CREATININE 6.62*  CALCIUM 8.3*   GFR: CrCl cannot be calculated (Unknown ideal weight.). Liver Function Tests: Recent Labs  Lab 03/17/19 1625  AST 61*  ALT 36  ALKPHOS 68  BILITOT 0.5  PROT 7.1  ALBUMIN 2.6*   Recent Labs  Lab 03/17/19 1625  LIPASE 72*   Urine analysis:    Component Value Date/Time   COLORURINE RED (A) 03/17/2019 1506   APPEARANCEUR CLOUDY (A) 03/17/2019 1506   LABSPEC  03/17/2019 1506    TEST NOT REPORTED DUE TO COLOR INTERFERENCE OF URINE PIGMENT   PHURINE  03/17/2019 1506    TEST NOT REPORTED DUE TO COLOR INTERFERENCE OF URINE PIGMENT   GLUCOSEU (A) 03/17/2019 1506    TEST NOT REPORTED DUE TO COLOR INTERFERENCE OF URINE PIGMENT   GLUCOSEU NEGATIVE 12/30/2017 1443   HGBUR (A) 03/17/2019 1506    TEST NOT REPORTED DUE TO COLOR INTERFERENCE OF URINE PIGMENT   BILIRUBINUR (A) 03/17/2019 1506    TEST NOT REPORTED DUE TO COLOR INTERFERENCE OF URINE PIGMENT   KETONESUR (A) 03/17/2019 1506    TEST NOT REPORTED DUE TO COLOR INTERFERENCE OF URINE PIGMENT   PROTEINUR (A) 03/17/2019 1506    TEST NOT REPORTED DUE TO COLOR INTERFERENCE OF URINE PIGMENT   UROBILINOGEN 0.2 12/30/2017 1443   NITRITE (A) 03/17/2019 1506    TEST NOT REPORTED DUE TO COLOR INTERFERENCE OF URINE PIGMENT   LEUKOCYTESUR (A) 03/17/2019 1506     TEST NOT REPORTED DUE TO COLOR INTERFERENCE OF URINE PIGMENT    Radiological Exams on Admission: Dg Chest Port 1 View  Result Date: 03/17/2019 CLINICAL DATA:  Diarrhea. EXAM: PORTABLE CHEST 1 VIEW COMPARISON:  No recent prior. FINDINGS: Stable cardiomegaly with normal pulmonary vascularity. Low lung volumes with mild basilar atelectasis. No pleural effusion or pneumothorax. Interposition of the colon under the right hemidiaphragm. IMPRESSION: Stable cardiomegaly with normal pulmonary vascularity. Low lung volumes with mild basilar atelectasis. No focal infiltrate. Electronically Signed   By: Marcello Moores  Register   On: 03/17/2019 15:40    EKG: Independently reviewed.  Sinus rhythm.  QTc 437.  No significant EKG changes from prior.  Assessment/Plan Active Problems:   AKI (acute kidney injury) ([place])   Acute kidney injury- Cr markedly elevated 6.6, from baseline 1.9-2. With elevated  BUN 120.  Likely ATN from hypotension, and dehydration over the past week.  With ACE-inh inhibitor use. - 1/2 N/s 100cc/hr x 1 day - BMP a.m - Hold Lisinopril - EDP talked to Dr. Marval Regal with nephrology, hospitalist to call reconsult in a.m. if needed.  Diarrhea- without abdominal pain. Temp 100.4. WBC- 7.6.  Nontender.  No vomiting. - F/u stool GI pathogen panel, Stool C.diff - hydrate - CT abdomen and pelvis without IV or Oral contrast (Called that patient was refusing oral contrast, CT ordered without) -Continue IV ceftriaxone and metronidazole -Follow-up blood cultures  Metabolic encephalopathy-likely from azotemia with BUN 120, hypotension, possible infectious etiology-intra-abdominal considering fever 100.4.  No Respiratory or flulike complaints. Chest x-ray negative for acute abnormality.  UA -analysis limited by urine pigment. 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  Daughter says catheter has been present for several years 5 to 6 years.   - Today with fever 100.4 and foul-smelling drainage-though daughter says smell is not new will obtain CT abdomen and pelvis  Dementia, CVA-significant,  requiring assistance with most ADLs.  -Continue home aspirin and Plavix  HTN-initial hypotension, now stable. -Hold home benazepril  DVT prophylaxis: Lovenox Code Status: Full Family Communication: Daughter at bedside Disposition Plan: per rounding team Consults called: None Admission status: Inpt, Tele.  At the point of admission it is my clinical judgment that the patient will require inpatient hospital care spanning beyond 2 midnights, due to high intensity of service, high risk for further deterioration and high frequency of surveillance required. The following factors support the patient status of inpatient.  "      Patient's presentation with hypotension, diarrhea, fever. He will need IV antibiotics to cover possible intra-abdominal infection, pending culture results.  Bethena Roys MD [facility]  03/17/2019, 8:08 PM

## 2019-03-17 NOTE — ED Provider Notes (Signed)
[place] Provider Note   CSN: [id number] Arrival date & time: 03/17/19  1401    History   Chief Complaint Chief Complaint  Patient presents with  . Weakness  . Diarrhea    HPI Robert Barnett is a 85 y.o. male.  Level 5 caveat secondary to altered mental status.  History is given by daughter who is present.  85 year old male with history of dementia has been getting progressively weak over 1 week.  He is having multiple loose bowel movements a day.  Over the past few days he has not been able to stand on his own and they were going to bring him to the office today but he could not even be moved.  EMS found blood pressure of 60/40.  They gave him normal saline bolus.  No reported cough no vomiting.  Daughter states he has not been communicating.  No sick contacts no recent travel.     The history is provided by the EMS personnel and a relative. The history is limited by the condition of the patient.  Weakness  Severity:  Severe Onset quality:  Gradual Duration:  1 week Timing:  Constant Progression:  Worsening Chronicity:  New Relieved by:  Nothing Worsened by:  Nothing Ineffective treatments:  None tried Associated symptoms: diarrhea, difficulty walking and lethargy   Associated symptoms: no falls, no hematochezia and no vomiting   Diarrhea:    Number of occurrences:  4/day   Severity:  Moderate   Timing:  Intermittent   Progression:  Unchanged Diarrhea  Associated symptoms: no vomiting     Past Medical History:  Diagnosis Date  . Arthritis   . BPH (benign prostatic hyperplasia)   . Carotid artery occlusion   . Dementia ([place])   . Dysphagia   . Hyperlipidemia   . Hypertension   . Peripheral vascular disease ([place])   . Stroke [facility])    Right hemispheric CVA  . Umbilical hernia     Patient Active Problem List   Diagnosis Date Noted  . Mass of breast, right 11/17/2018  . Cachexia ([place]) 08/10/2018  . CVA, old, disturbances of vision  06/14/2018  . Chronic idiopathic constipation 06/14/2018  . Dietary folate deficiency anemia 06/14/2018  . Iron deficiency anemia due to dietary causes 06/14/2018  . Bowel and bladder incontinence 05/27/2018  . Pancreatic cyst   . Eczema 03/31/2018  . 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 CKD (chronic kidney disease) stage 4, GFR 15-29 ml/min (HCC) 09/03/2017  . AAA (abdominal aortic aneurysm) ([place]) 09/03/2017  . Essential hypertension 07/30/2017  . Abdominal aortic atherosclerosis ([place]) 08/19/2016  . Bilateral low back pain without sciatica 08/18/2016  . Hypothyroidism 07/23/2016  . Moderate dementia without behavioral disturbance ([place]) 07/23/2016  . Hyperlipidemia 11/17/2014  . BPH (benign prostatic hyperplasia)     Past Surgical History:  Procedure Laterality Date  . CAROTID ENDARTERECTOMY  10/06/2007   Right CEA by Dr. Amedeo Plenty  . IR CATHETER TUBE CHANGE  09/15/2017  . IR CATHETER TUBE CHANGE  12/03/2017  . IR CATHETER TUBE CHANGE  12/23/2017  . IR CATHETER TUBE CHANGE  05/07/2018  . IR CATHETER TUBE CHANGE  06/29/2018  . IR CATHETER TUBE CHANGE  08/18/2018  . IR CATHETER TUBE CHANGE  09/08/2018  . IR CATHETER TUBE CHANGE  10/15/2018  . IR CATHETER TUBE CHANGE  01/26/2019  . IR GENERIC HISTORICAL  07/09/2016   IR RADIOLOGIST EVAL & MGMT 07/09/2016 Arne Cleveland, MD [facility] INTERV RAD  . IR  RADIOLOGIST EVAL & MGMT  09/15/2017  . IR RADIOLOGIST EVAL & MGMT  05/06/2018  . PEG TUBE PLACEMENT  2008   after CVA        Home Medications    Prior to Admission medications   Medication Sig Start Date End Date Taking? Authorizing Provider  aspirin 81 MG tablet Take 1 tablet (81 mg total) by mouth daily. 06/14/18   Janith Lima, MD  benazepril (LOTENSIN) 40 MG tablet TAKE 1 TABLET BY MOUTH EVERY DAY 09/18/18   Janith Lima, MD  clopidogrel (PLAVIX) 75 MG tablet TAKE 1 TABLET BY MOUTH EVERY DAY 01/11/19   Janith Lima, MD  Colchicine 0.6 MG CAPS TAKE 1 CAPSULE BY MOUTH TWICE A DAY 11/26/18    Janith Lima, MD  dronabinol (MARINOL) 2.5 MG capsule Take 1 capsule (2.5 mg total) by mouth 2 (two) times daily before lunch and supper. 08/10/18   Janith Lima, MD  ferrous sulfate 325 (65 FE) MG EC tablet TAKE 1 TABLET BY MOUTH EVERY DAY WITH BREAKFAST 12/08/18   Janith Lima, MD  folic acid (FOLVITE) 1 MG tablet TAKE 1 TABLET BY MOUTH EVERY DAY 12/08/18   Janith Lima, MD  levothyroxine (SYNTHROID, LEVOTHROID) 50 MCG tablet TAKE 1 TABLET (50 MCG TOTAL) BY MOUTH DAILY. 12/30/18   Janith Lima, MD  simvastatin (ZOCOR) 20 MG tablet Take 1 tablet (20 mg total) by mouth daily. 10/01/18   Janith Lima, MD  thiamine 100 MG tablet TAKE 1 TABLET BY MOUTH EVERY DAY 01/03/19   Janith Lima, MD  triamcinolone cream (KENALOG) 0.5 % Apply 1 application topically 2 (two) times daily. 03/31/18   Janith Lima, MD    Family History Family History  Problem Relation Age of Onset  . Obesity Daughter     Social History Social History   Tobacco Use  . Smoking status: Former Research scientist (life sciences)  . Smokeless tobacco: Never Used  . Tobacco comment: remote h/o  Substance Use Topics  . Alcohol use: No  . Drug use: No     Allergies   Patient has no known allergies.   Review of Systems Review of Systems  Unable to perform ROS: Mental status change  Gastrointestinal: Positive for diarrhea. Negative for hematochezia and vomiting.  Musculoskeletal: Negative for falls.  Neurological: Positive for weakness.     Physical Exam Updated Vital Signs BP (!) 89/55   Pulse 83   Temp (!) 100.4 F (38 C) (Oral)   Resp (!) 27   SpO2 92%   Physical Exam Vitals signs and nursing note reviewed.  Constitutional:      Appearance: He is well-developed. He is ill-appearing.  HENT:     Head: Normocephalic and atraumatic.     Mouth/Throat:     Mouth: Mucous membranes are dry.  Eyes:     Conjunctiva/sclera: Conjunctivae normal.  Neck:     Musculoskeletal: Neck supple.  Cardiovascular:     Rate and  Rhythm: Normal rate and regular rhythm.     Heart sounds: No murmur.  Pulmonary:     Effort: Pulmonary effort is normal. Tachypnea present. No respiratory distress.     Breath sounds: Normal breath sounds.  Abdominal:     Palpations: Abdomen is soft.     Tenderness: There is abdominal tenderness (diffuse).     Comments: He is a drain in his left lower quadrant from a reported abdominal abscess.  Daughter states this is been there for a  while and is draining smelly fluid.  Musculoskeletal:        General: No deformity or signs of injury.     Right lower leg: No edema.     Left lower leg: No edema.  Skin:    General: Skin is warm and dry.     Capillary Refill: Capillary refill takes less than 2 seconds.  Neurological:     Comments: Patient is lethargic.  He will moan to exam and localize.  He is not following commands.      ED Treatments / Results  Labs (all labs ordered are listed, but only abnormal results are displayed) Labs Reviewed  LACTIC ACID, PLASMA - Abnormal; Notable for the following components:      Result Value   Lactic Acid, Venous 2.4 (*)    All other components within normal limits  LACTIC ACID, PLASMA - Abnormal; Notable for the following components:   Lactic Acid, Venous 2.3 (*)    All other components within normal limits  COMPREHENSIVE METABOLIC PANEL - Abnormal; Notable for the following components:   Sodium 147 (*)    Potassium 5.4 (*)    Chloride 116 (*)    CO2 17 (*)    BUN 120 (*)    Creatinine, Ser 6.62 (*)    Calcium 8.3 (*)    Albumin 2.6 (*)    AST 61 (*)    GFR calc non Af Amer 7 (*)    GFR calc Af Amer 8 (*)    All other components within normal limits  CBC WITH DIFFERENTIAL/PLATELET - Abnormal; Notable for the following components:   RBC 3.68 (*)    Hemoglobin 11.8 (*)    MCV 109.8 (*)    MCHC 29.2 (*)    RDW 18.1 (*)    Lymphs Abs 0.6 (*)    Monocytes Absolute 1.4 (*)    Abs Immature Granulocytes 0.27 (*)    All other components  within normal limits  URINALYSIS, ROUTINE W REFLEX MICROSCOPIC - Abnormal; Notable for the following components:   Color, Urine RED (*)    APPearance CLOUDY (*)    Glucose, UA   (*)    Value: TEST NOT REPORTED DUE TO COLOR INTERFERENCE OF URINE PIGMENT   Hgb urine dipstick   (*)    Value: TEST NOT REPORTED DUE TO COLOR INTERFERENCE OF URINE PIGMENT   Bilirubin Urine   (*)    Value: TEST NOT REPORTED DUE TO COLOR INTERFERENCE OF URINE PIGMENT   Ketones, ur   (*)    Value: TEST NOT REPORTED DUE TO COLOR INTERFERENCE OF URINE PIGMENT   Protein, ur   (*)    Value: TEST NOT REPORTED DUE TO COLOR INTERFERENCE OF URINE PIGMENT   Nitrite   (*)    Value: TEST NOT REPORTED DUE TO COLOR INTERFERENCE OF URINE PIGMENT   Leukocytes,Ua   (*)    Value: TEST NOT REPORTED DUE TO COLOR INTERFERENCE OF URINE PIGMENT   All other components within normal limits  LIPASE, BLOOD - Abnormal; Notable for the following components:   Lipase 72 (*)    All other components within normal limits  URINALYSIS, MICROSCOPIC (REFLEX) - Abnormal; Notable for the following components:   Bacteria, UA MANY (*)    All other components within normal limits  CBC - Abnormal; Notable for the following components:   RBC 3.56 (*)    Hemoglobin 11.5 (*)    MCV 109.8 (*)    MCHC 29.4 (*)  RDW 17.9 (*)    nRBC 0.4 (*)    All other components within normal limits  BASIC METABOLIC PANEL - Abnormal; Notable for the following components:   Sodium 146 (*)    Potassium 5.6 (*)    Chloride 121 (*)    CO2 12 (*)    BUN 121 (*)    Creatinine, Ser 5.83 (*)    Calcium 7.7 (*)    GFR calc non Af Amer 8 (*)    GFR calc Af Amer 9 (*)    All other components within normal limits  URINALYSIS, ROUTINE W REFLEX MICROSCOPIC - Abnormal; Notable for the following components:   APPearance CLOUDY (*)    Hgb urine dipstick LARGE (*)    Protein, ur 30 (*)    Leukocytes,Ua LARGE (*)    RBC / HPF >50 (*)    WBC, UA >50 (*)    Bacteria, UA  RARE (*)    All other components within normal limits  CULTURE, BLOOD (ROUTINE X 2)  CULTURE, BLOOD (ROUTINE X 2)  C DIFFICILE QUICK SCREEN W PCR REFLEX  GASTROINTESTINAL PANEL BY PCR, STOOL (REPLACES STOOL CULTURE)  LACTIC ACID, PLASMA  URINALYSIS, ROUTINE W REFLEX MICROSCOPIC  CREATININE, URINE, RANDOM  SODIUM, URINE, RANDOM  BASIC METABOLIC PANEL    EKG EKG Interpretation  Date/Time:  Thursday March 17 2019 14:11:33 EDT Ventricular Rate:  85 PR Interval:    QRS Duration: 101 QT Interval:  367 QTC Calculation: 437 R Axis:   61 Text Interpretation:  Sinus rhythm Low voltage, precordial leads similar to prior 5/19 Confirmed by Aletta Edouard [phone]) on 03/17/2019 3:14:25 PM   Radiology Ct Abdomen Pelvis Wo Contrast  Result Date: 03/18/2019 CLINICAL DATA:  85 year old male with history of dementia. Generalized weakness over the past week. EXAM: CT ABDOMEN AND PELVIS WITHOUT CONTRAST TECHNIQUE: Multidetector CT imaging of the abdomen and pelvis was performed following the standard protocol without IV contrast. COMPARISON:  CT the abdomen and pelvis 05/27/2018. FINDINGS: Lower chest: Heart size is normal. There is no significant pericardial fluid, thickening or pericardial calcification. There is aortic atherosclerosis, as well as atherosclerosis of the great vessels of the mediastinum and the coronary arteries, including calcified atherosclerotic plaque in the left main, left anterior descending, left circumflex and right coronary arteries. Calcifications of the aortic valve. Bibasilar areas of volume loss and architectural distortion with some peribronchovascular ground-glass attenuation, which may reflect sequela of mild aspiration. Hepatobiliary: No definite suspicious cystic or solid hepatic lesions are confidently identified on today's noncontrast CT examination. Multiple small calcified gallstones lying dependently in the gallbladder. No surrounding inflammatory changes to suggest  an acute cholecystitis at this time. Pancreas: No definite pancreatic mass or peripancreatic fluid or inflammatory changes noted on today's noncontrast CT examination. Spleen: Unremarkable. Adrenals/Urinary Tract: Low-attenuation lesions in both kidneys, incompletely characterized on today's noncontrast CT examination, but statistically likely to represent cysts, measuring up to 3.7 cm in the upper pole of the right kidney. Bilateral adrenal glands are normal in appearance. No hydroureteronephrosis. Stomach/Bowel: Normal appearance of the stomach. No pathologic dilatation of small bowel or colon. Numerous colonic diverticulae are noted, without surrounding inflammatory changes to suggest an acute diverticulitis at this time. Normal appendix. Adjacent to the mid sigmoid colon there is again a pigtail drainage catheter which is immediately anterior to the superior aspect of the urinary bladder and immediately deep to the anterior abdominal wall musculature. This is within a completely collapsed cavity. Vascular/Lymphatic: Aortic atherosclerosis. Aneurysmal dilatation of  the common iliac arteries bilaterally measuring 3.1 cm on the right and 2.3 cm on the left. No lymphadenopathy noted in the abdomen or pelvis. Reproductive: Prostate gland and seminal vesicles are unremarkable in appearance. Other: No significant volume of ascites.  No pneumoperitoneum. Musculoskeletal: There are no aggressive appearing lytic or blastic lesions noted in the visualized portions of the skeleton. IMPRESSION: 1. There are no definite acute findings noted in the abdomen or pelvis to account for the patient's symptoms. 2. Dependent changes in the lung bases bilaterally concerning for sequela of mild aspiration. 3. Indwelling pigtail drainage catheter in the low anterior anatomic pelvis for chronic colocutaneous fistula redemonstrated. This is within a completely collapsed collection. No new abscess identified. 4. Colonic diverticulosis  without evidence of acute diverticulitis at this time. 5. Aortic atherosclerosis, in addition to left main and 3 vessel coronary artery disease. 6. There are calcifications of the aortic valve. Echocardiographic correlation for evaluation of potential valvular dysfunction may be warranted if clinically indicated. Electronically Signed   By: Vinnie Langton M.D.   On: 03/18/2019 09:15   Dg Chest Port 1 View  Result Date: 03/17/2019 CLINICAL DATA:  Diarrhea. EXAM: PORTABLE CHEST 1 VIEW COMPARISON:  No recent prior. FINDINGS: Stable cardiomegaly with normal pulmonary vascularity. Low lung volumes with mild basilar atelectasis. No pleural effusion or pneumothorax. Interposition of the colon under the right hemidiaphragm. IMPRESSION: Stable cardiomegaly with normal pulmonary vascularity. Low lung volumes with mild basilar atelectasis. No focal infiltrate. Electronically Signed   By: Marcello Moores  Register   On: 03/17/2019 15:40    Procedures .Critical Care Performed by: Hayden Rasmussen, MD Authorized by: Hayden Rasmussen, MD   Critical care provider statement:    Critical care time (minutes):  45   Critical care time was exclusive of:  Separately billable procedures and treating other patients   Critical care was necessary to treat or prevent imminent or life-threatening deterioration of the following conditions:  Circulatory failure, sepsis and renal failure   Critical care was time spent personally by me on the following activities:  Discussions with consultants, evaluation of patient's response to treatment, examination of patient, ordering and performing treatments and interventions, ordering and review of laboratory studies, ordering and review of radiographic studies, pulse oximetry, re-evaluation of patient's condition, obtaining history from patient or surrogate, review of old charts and development of treatment plan with patient or surrogate   I assumed direction of critical care for this patient  from another provider in my specialty: no     (including critical care time)  Medications Ordered in ED Medications  sodium chloride 0.9 % bolus 1,000 mL (has no administration in time range)    And  sodium chloride 0.9 % bolus 1,000 mL (has no administration in time range)    And  sodium chloride 0.9 % bolus 1,000 mL (has no administration in time range)  ceFEPIme (MAXIPIME) 2 g in sodium chloride 0.9 % 100 mL IVPB (has no administration in time range)  metroNIDAZOLE (FLAGYL) IVPB 500 mg (has no administration in time range)     Initial Impression / Assessment and Plan / ED Course  I have reviewed the triage vital signs and the nursing notes.  Pertinent labs & imaging results that were available during my care of the patient were reviewed by me and considered in my medical decision making (see chart for details).  Clinical Course as of Mar 17 1399  Thu Mar 16, 4589  37 85 year old male here with generalized  weakness in the setting of diarrhea.  He is febrile and hypotensive here.  He looks extremely dehydrated.  We have activated him as a code sepsis and he is getting fluid resuscitated.  Differential diagnosis includes hypovolemia, sepsis, shock, intra-abdominal abscess, ischemic bowel, pneumonia   [MB]  1804 Discussed with nephrology Dr. Jane Canary at [facility]  He agrees with current management of fluid resuscitation and he would recommend to the admitting team to hold his benazepril.  He can see the patient tomorrow but asks if the hospitalist would call him tonight if he needs to be seen tonight.  He does not recommend dialysis at this time.   [MB]  0981 Discussed with [facility] hospitalist to evaluate the patient for admission.  Of updated the patient and his daughter about the results and the need for admission.   [MB]    Clinical Course User Index [MB] Hayden Rasmussen, MD        Final Clinical Impressions(s) / ED Diagnoses   Final diagnoses:  Sepsis with acute renal  failure without septic shock, due to unspecified organism, unspecified acute renal failure type ([place])  Diarrhea, unspecified type  Altered mental status, unspecified altered mental status type    ED Discharge Orders    None       Hayden Rasmussen, MD 03/18/19 [id number]

## 2019-03-17 NOTE — ED Triage Notes (Signed)
Pt lives at home with family>  Reports called ems because pt has generalized weakness and diarrhea x 1 week.    Reports family was unable to get pt in the car due to weakness.  BP 60/40 with ems.  EMS administered 533ml bolus nss

## 2019-03-17 NOTE — ED Notes (Signed)
Phlebotomy at bedside.

## 2019-03-17 NOTE — ED Notes (Signed)
ED Provider at bedside. 

## 2019-03-17 NOTE — ED Notes (Signed)
Notified phlebotomy that patient is a code sepsis and lab work was needed ASAP.

## 2019-03-17 NOTE — ED Notes (Signed)
Unable to obtain labs with second IV stick. Phlebotomy notified.

## 2019-03-18 ENCOUNTER — Encounter (HOSPITAL_COMMUNITY): Payer: Self-pay

## 2019-03-18 ENCOUNTER — Other Ambulatory Visit: Payer: Self-pay

## 2019-03-18 ENCOUNTER — Inpatient Hospital Stay (HOSPITAL_COMMUNITY): Payer: Medicare Other

## 2019-03-18 DIAGNOSIS — E870 Hyperosmolality and hypernatremia: Secondary | ICD-10-CM

## 2019-03-18 DIAGNOSIS — R4182 Altered mental status, unspecified: Secondary | ICD-10-CM

## 2019-03-18 DIAGNOSIS — R197 Diarrhea, unspecified: Secondary | ICD-10-CM

## 2019-03-18 DIAGNOSIS — E875 Hyperkalemia: Secondary | ICD-10-CM

## 2019-03-18 DIAGNOSIS — F0391 Unspecified dementia with behavioral disturbance: Secondary | ICD-10-CM

## 2019-03-18 LAB — URINALYSIS, ROUTINE W REFLEX MICROSCOPIC
BILIRUBIN URINE: NEGATIVE
Bilirubin Urine: NEGATIVE
Glucose, UA: NEGATIVE mg/dL
Glucose, UA: NEGATIVE mg/dL
Ketones, ur: NEGATIVE mg/dL
Ketones, ur: NEGATIVE mg/dL
Nitrite: NEGATIVE
Nitrite: NEGATIVE
Protein, ur: 30 mg/dL — AB
Protein, ur: NEGATIVE mg/dL
RBC / HPF: >50 RBC/hpf — ABNORMAL HIGH (ref 0–5)
Specific Gravity, Urine: 1.011 (ref 1.005–1.030)
Specific Gravity, Urine: 1.012 (ref 1.005–1.030)
WBC, UA: >50 WBC/hpf — ABNORMAL HIGH (ref 0–5)
WBC, UA: >50 WBC/hpf — ABNORMAL HIGH (ref 0–5)
pH: 5 (ref 5.0–8.0)
pH: 5 (ref 5.0–8.0)

## 2019-03-18 LAB — CBC
HCT: 39.1 % (ref 39.0–52.0)
Hemoglobin: 11.5 g/dL — ABNORMAL LOW (ref 13.0–17.0)
MCH: 32.3 pg (ref 26.0–34.0)
MCHC: 29.4 g/dL — ABNORMAL LOW (ref 30.0–36.0)
MCV: 109.8 fL — ABNORMAL HIGH (ref 80.0–100.0)
PLATELETS: 164 10*3/uL (ref 150–400)
RBC: 3.56 MIL/uL — AB (ref 4.22–5.81)
RDW: 17.9 % — ABNORMAL HIGH (ref 11.5–15.5)
WBC: 5.7 10*3/uL (ref 4.0–10.5)
nRBC: 0.4 % — ABNORMAL HIGH (ref 0.0–0.2)

## 2019-03-18 LAB — BASIC METABOLIC PANEL
Anion gap: 13 (ref 5–15)
Anion gap: 12 (ref 5–15)
BUN: 121 mg/dL — ABNORMAL HIGH (ref 8–23)
BUN: 116 mg/dL — ABNORMAL HIGH (ref 8–23)
CO2: 12 mmol/L — ABNORMAL LOW (ref 22–32)
CO2: 16 mmol/L — ABNORMAL LOW (ref 22–32)
Calcium: 7.7 mg/dL — ABNORMAL LOW (ref 8.9–10.3)
Calcium: 7.6 mg/dL — ABNORMAL LOW (ref 8.9–10.3)
Chloride: 121 mmol/L — ABNORMAL HIGH (ref 98–111)
Chloride: 118 mmol/L — ABNORMAL HIGH (ref 98–111)
Creatinine, Ser: 5.83 mg/dL — ABNORMAL HIGH (ref 0.61–1.24)
Creatinine, Ser: 5.14 mg/dL — ABNORMAL HIGH (ref 0.61–1.24)
GFR calc Af Amer: 9 mL/min — ABNORMAL LOW (ref >60)
GFR calc Af Amer: 11 mL/min — ABNORMAL LOW (ref >60)
GFR calc non Af Amer: 8 mL/min — ABNORMAL LOW (ref >60)
GFR calc non Af Amer: 10 mL/min — ABNORMAL LOW (ref >60)
GLUCOSE: 81 mg/dL (ref 70–99)
Glucose, Bld: 80 mg/dL (ref 70–99)
Potassium: 5.6 mmol/L — ABNORMAL HIGH (ref 3.5–5.1)
Potassium: 4.8 mmol/L (ref 3.5–5.1)
Sodium: 146 mmol/L — ABNORMAL HIGH (ref 135–145)
Sodium: 146 mmol/L — ABNORMAL HIGH (ref 135–145)

## 2019-03-18 LAB — C DIFFICILE QUICK SCREEN W PCR REFLEX
C DIFFICILE (CDIFF) TOXIN: NEGATIVE
C Diff antigen: NEGATIVE
C Diff interpretation: NOT DETECTED

## 2019-03-18 LAB — CREATININE, URINE, RANDOM: CREATININE, URINE: 98.37 mg/dL

## 2019-03-18 LAB — LACTIC ACID, PLASMA: LACTIC ACID, VENOUS: 1.7 mmol/L (ref 0.5–1.9)

## 2019-03-18 LAB — SODIUM, URINE, RANDOM: Sodium, Ur: 49 mmol/L

## 2019-03-18 MED ORDER — SODIUM BICARBONATE 650 MG PO TABS
650.0000 mg | ORAL_TABLET | Freq: Three times a day (TID) | ORAL | Status: DC
  Filled 2019-03-18: qty 1

## 2019-03-18 MED ORDER — FENTANYL CITRATE (PF) 100 MCG/2ML IJ SOLN
25.0000 ug | INTRAMUSCULAR | Status: DC | PRN
  Administered 2019-03-18 (×2): 25 ug via INTRAVENOUS
  Filled 2019-03-18: qty 2

## 2019-03-18 MED ORDER — FENTANYL CITRATE (PF) 100 MCG/2ML IJ SOLN
INTRAMUSCULAR | Status: AC
  Filled 2019-03-18: qty 2

## 2019-03-18 MED ORDER — STERILE WATER FOR INJECTION IV SOLN
INTRAVENOUS | Status: DC
  Administered 2019-03-18 – 2019-03-19 (×3): via INTRAVENOUS
  Filled 2019-03-18 (×13): qty 850

## 2019-03-18 MED ORDER — SODIUM ZIRCONIUM CYCLOSILICATE 10 G PO PACK: 10.0000 g | PACK | Freq: Two times a day (BID) | ORAL | Status: AC

## 2019-03-18 NOTE — Progress Notes (Signed)
Dr.Madera given status update. Less alert. Temp 100.1 AX, B/P 98/50, P-82, RR 16, and P-OX 96% on RA. CT pending. Pt refused to drink contrast at beginning of shift and order changed to CT scan without contrast. LLQ drain leaking around site- brown foul smelling drainage. MD requested Tylenol to be given.

## 2019-03-18 NOTE — Progress Notes (Signed)
PT Cancellation Note  Patient Details Name: Robert Barnett MRN: [id number] DOB: 05-Dec-1932   Cancelled Treatment:    Reason Eval/Treat Not Completed: Fatigue/lethargy limiting ability to participate;Patient's level of consciousness(pt very lethargic and difficulty to keep awake. Pt has palliative consult awaiting for Monday, 03/21/19, Will check back at a later time/day as appropriate.)   Geraldine Solar PT, DPT

## 2019-03-18 NOTE — Progress Notes (Signed)
Palliative Medicine consult order noted. PM[place] provider[place] onMarch 23, 2020.If the patient remains hospitalized, the consult will be evaluated at that time. If recommendations are needed in the interim, please call our office at [phone].   Marjie Skiff Elby Blackwelder, RN, BSN, [facility] Palliative Medicine Team 03/18/2019 10:42 AM Office [phone]

## 2019-03-18 NOTE — Progress Notes (Signed)
PROGRESS NOTE    Robert Barnett  MOQ:[id number] DOB: January 11, 1932 DOA: 03/17/2019 PCP: Janith Lima, MD     Brief Narrative:  85 y.o. male with medical history significant for dementia, CKD4, AAA, BPH, hypothyrodism, who was bought to the Ed with c/o mutiple episodes of loose stool and generalized weakness over the past week.  Today patient was not responding communicating as he used to. Patient found with acute on chronic renal failure and concerns of underlying infectious.  Assessment & Plan: 1-Acute on chronic renal failure: Patient stage IIIb/IV at baseline. -Appears to be secondary to prerenal azotemia and volume depletion. -Continue IV fluids -Appreciate nephrology service recommendation and assistance -Continue holding nephrotoxic agents -Follow renal function trend -Avoid NSAIDs.  2-hyperkalemia -Secondary to acute on chronic kidney injury and preceding use of ACE inhibitors -Continue holding ACE inhibitor -Continue IV fluids -Patient started on Lokelma. -Follow electrolytes trend.  3-mixed anion gap with non-gap metabolic acidosis -Secondary to diarrhea and acute on chronic renal failure. -Continue isotonic sodium bicarbonate infusion as recommended by nephrology service -Follow electrolytes and response.  4-fever/hypotension -Continue avoiding antihypertensive medication as taken prior to admission -Continue current antibiotics -No acute abnormalities appreciated on CT abdomen pelvis -Follow urine cultures -Continue supportive care.  5-hypernatremia: very indicative of poor oral intake and overall dehydration -Continue fluid resuscitation's -Elevated sodium most likely contributing with patient worsening mentation.  6-altered mental status in a patient with underlying dementia -Continue to minimize medications that can alter sensorium -Fix abnormal electrolytes -Continue supportive care.   DVT prophylaxis: SCD's Code Status: Full code Family  Communication: No family at bedside. Disposition Plan: To be determined.  Continue fluid resuscitation, started on Lokelma, follow electrolytes and continue IV antibiotics.  Consultants:   Nephrology service  Palliative care  Procedures:   See below for x-ray reports  Antimicrobials:  Anti-infectives (From admission, onward)   Start     Dose/Rate Route Frequency Ordered Stop   03/17/19 2315  metroNIDAZOLE (FLAGYL) IVPB 500 mg     500 mg 100 mL/hr over 60 Minutes Intravenous Every 8 hours 03/17/19 2306     03/17/19 2300  cefTRIAXone (ROCEPHIN) 2 g in sodium chloride 0.9 % 100 mL IVPB     2 g 200 mL/hr over 30 Minutes Intravenous Every 24 hours 03/17/19 2215     03/17/19 1515  ceFEPIme (MAXIPIME) 2 g in sodium chloride 0.9 % 100 mL IVPB     2 g 200 mL/hr over 30 Minutes Intravenous  Once 03/17/19 1508 03/17/19 1702   03/17/19 1515  metroNIDAZOLE (FLAGYL) IVPB 500 mg     500 mg 100 mL/hr over 60 Minutes Intravenous  Once 03/17/19 1508 03/17/19 1810       Subjective: Afebrile, no chest pain, no nausea, no vomiting.  Patient is screaming out in pain with low-grade temperature overnight.  Positive dry oral mucosa.  Objective: Vitals:   03/17/19 2137 03/18/19 0529 03/18/19 0856 03/18/19 1359  BP: 139/68 (!) 98/50  (!) 90/52  Pulse: 86 82  93  Resp: (!) 26   17  Temp: 98.9 F (37.2 C) 100.1 F (37.8 C)  99.1 F (37.3 C)  TempSrc: Oral Oral    SpO2: 92% 96%  98%  Weight: 87 kg     Height:   5\' 10"  (1.778 m)     Intake/Output Summary (Last 24 hours) at 03/18/2019 1848 Last data filed at 03/18/2019 0620 Gross per 24 hour  Intake 810 ml  Output 350 ml  Net 460  ml   Filed Weights   03/17/19 2137  Weight: 87 kg    Examination: General exam: Alert, awake, oriented x 1; able to follow simple commands.  In mild distress and yelling out in pain.  Afebrile currently. Respiratory system: Positive scattered rhonchi.  Respiratory effort normal.  No using accessory  muscle. Cardiovascular system:Rate controlled, no rubs, no gallops. Gastrointestinal system: Abdomen is nondistended, Soft and with positive bowel sounds.  Left lower quadrant drain in place, positive discharge and foul-smelling appreciated.   Central nervous system: No focal neurological deficits; moving 4 limbs. Extremities: No cyanosis or clubbing. Skin: No rashes, lesions or ulcers Psychiatry: Poor judgment and insight in the setting of underlying dementia; unable to properly assessed mood.  Data Reviewed: I have personally reviewed following labs and imaging studies  CBC: Recent Labs  Lab 03/17/19 1625 03/18/19 0217  WBC 7.6 5.7  NEUTROABS 5.1  --   HGB 11.8* 11.5*  HCT 40.4 39.1  MCV 109.8* 109.8*  PLT 191 431   Basic Metabolic Panel: Recent Labs  Lab 03/17/19 1625 03/18/19 0217 03/18/19 1648  NA 147* 146* 146*  K 5.4* 5.6* 4.8  CL 116* 121* 118*  CO2 17* 12* 16*  GLUCOSE 98 81 80  BUN 120* 121* 116*  CREATININE 6.62* 5.83* 5.14*  CALCIUM 8.3* 7.7* 7.6*   GFR: Estimated Creatinine Clearance: 11 mL/min (A) (by C-G formula based on SCr of 5.14 mg/dL (H)).   Liver Function Tests: Recent Labs  Lab 03/17/19 1625  AST 61*  ALT 36  ALKPHOS 68  BILITOT 0.5  PROT 7.1  ALBUMIN 2.6*   Recent Labs  Lab 03/17/19 1625  LIPASE 72*   Urine analysis:    Component Value Date/Time   COLORURINE YELLOW 03/18/2019 1500   APPEARANCEUR CLOUDY (A) 03/18/2019 1500   LABSPEC 1.012 03/18/2019 1500   PHURINE 5.0 03/18/2019 1500   GLUCOSEU NEGATIVE 03/18/2019 1500   GLUCOSEU NEGATIVE 12/30/2017 1443   HGBUR LARGE (A) 03/18/2019 1500   BILIRUBINUR NEGATIVE 03/18/2019 1500   KETONESUR NEGATIVE 03/18/2019 1500   PROTEINUR NEGATIVE 03/18/2019 1500   UROBILINOGEN 0.2 12/30/2017 1443   NITRITE NEGATIVE 03/18/2019 1500   LEUKOCYTESUR LARGE (A) 03/18/2019 1500    Recent Results (from the past 240 hour(s))  Blood Culture (routine x 2)     Status: None (Preliminary result)    Collection Time: 03/17/19  4:25 PM  Result Value Ref Range Status   Specimen Description BLOOD RIGHT HAND  Final   Special Requests AEROBIC BOTTLE ONLY Blood Culture adequate volume  Final   Culture   Final    NO GROWTH < 24 HOURS Performed at [facility], [address], [place], [place] [address]    Report Status PENDING  Incomplete  Blood Culture (routine x 2)     Status: None (Preliminary result)   Collection Time: 03/17/19  9:12 PM  Result Value Ref Range Status   Specimen Description BLOOD RIGHT ANTECUBITAL  Final   Special Requests   Final    BOTTLES DRAWN AEROBIC AND ANAEROBIC Blood Culture adequate volume   Culture   Final    NO GROWTH < 12 HOURS Performed at [facility], [address], [place],  [address]    Report Status PENDING  Incomplete  C Difficile Quick Screen w PCR reflex     Status: None   Collection Time: 03/18/19  9:00 AM  Result Value Ref Range Status   C Diff antigen NEGATIVE NEGATIVE Final   C  Diff toxin NEGATIVE NEGATIVE Final   C Diff interpretation No C. difficile detected.  Final    Comment: Performed at [facility], [address], [place], [place] [address]    Radiology Studies: Ct Abdomen Pelvis Wo Contrast  Result Date: 03/18/2019 CLINICAL DATA:  85 year old male with history of dementia. Generalized weakness over the past week. EXAM: CT ABDOMEN AND PELVIS WITHOUT CONTRAST TECHNIQUE: Multidetector CT imaging of the abdomen and pelvis was performed following the standard protocol without IV contrast. COMPARISON:  CT the abdomen and pelvis 05/27/2018. FINDINGS: Lower chest: Heart size is normal. There is no significant pericardial fluid, thickening or pericardial calcification. There is aortic atherosclerosis, as well as atherosclerosis of the great vessels of the mediastinum and the coronary arteries, including calcified atherosclerotic plaque in the left main, left anterior descending, left circumflex and right coronary arteries. Calcifications  of the aortic valve. Bibasilar areas of volume loss and architectural distortion with some peribronchovascular ground-glass attenuation, which may reflect sequela of mild aspiration. Hepatobiliary: No definite suspicious cystic or solid hepatic lesions are confidently identified on today's noncontrast CT examination. Multiple small calcified gallstones lying dependently in the gallbladder. No surrounding inflammatory changes to suggest an acute cholecystitis at this time. Pancreas: No definite pancreatic mass or peripancreatic fluid or inflammatory changes noted on today's noncontrast CT examination. Spleen: Unremarkable. Adrenals/Urinary Tract: Low-attenuation lesions in both kidneys, incompletely characterized on today's noncontrast CT examination, but statistically likely to represent cysts, measuring up to 3.7 cm in the upper pole of the right kidney. Bilateral adrenal glands are normal in appearance. No hydroureteronephrosis. Stomach/Bowel: Normal appearance of the stomach. No pathologic dilatation of small bowel or colon. Numerous colonic diverticulae are noted, without surrounding inflammatory changes to suggest an acute diverticulitis at this time. Normal appendix. Adjacent to the mid sigmoid colon there is again a pigtail drainage catheter which is immediately anterior to the superior aspect of the urinary bladder and immediately deep to the anterior abdominal wall musculature. This is within a completely collapsed cavity. Vascular/Lymphatic: Aortic atherosclerosis. Aneurysmal dilatation of the common iliac arteries bilaterally measuring 3.1 cm on the right and 2.3 cm on the left. No lymphadenopathy noted in the abdomen or pelvis. Reproductive: Prostate gland and seminal vesicles are unremarkable in appearance. Other: No significant volume of ascites.  No pneumoperitoneum. Musculoskeletal: There are no aggressive appearing lytic or blastic lesions noted in the visualized portions of the skeleton.  IMPRESSION: 1. There are no definite acute findings noted in the abdomen or pelvis to account for the patient's symptoms. 2. Dependent changes in the lung bases bilaterally concerning for sequela of mild aspiration. 3. Indwelling pigtail drainage catheter in the low anterior anatomic pelvis for chronic colocutaneous fistula redemonstrated. This is within a completely collapsed collection. No new abscess identified. 4. Colonic diverticulosis without evidence of acute diverticulitis at this time. 5. Aortic atherosclerosis, in addition to left main and 3 vessel coronary artery disease. 6. There are calcifications of the aortic valve. Echocardiographic correlation for evaluation of potential valvular dysfunction may be warranted if clinically indicated. Electronically Signed   By: Vinnie Langton M.D.   On: 03/18/2019 09:15   Dg Chest Port 1 View  Result Date: 03/17/2019 CLINICAL DATA:  Diarrhea. EXAM: PORTABLE CHEST 1 VIEW COMPARISON:  No recent prior. FINDINGS: Stable cardiomegaly with normal pulmonary vascularity. Low lung volumes with mild basilar atelectasis. No pleural effusion or pneumothorax. Interposition of the colon under the right hemidiaphragm. IMPRESSION: Stable cardiomegaly with normal pulmonary vascularity. Low lung volumes with mild  basilar atelectasis. No focal infiltrate. Electronically Signed   By: Marcello Moores  Register   On: 03/17/2019 15:40    Scheduled Meds:  aspirin EC  81 mg Oral Daily   clopidogrel  75 mg Oral Daily   levothyroxine  50 mcg Oral Daily   sodium zirconium cyclosilicate  10 g Oral BID   Continuous Infusions:  cefTRIAXone (ROCEPHIN)  IV Stopped (03/18/19 0016)   metronidazole 500 mg (03/18/19 1600)    sodium bicarbonate (isotonic) infusion in sterile water 150 mL/hr at 03/18/19 1130     LOS: 1 day    Time spent: 30 minutes   Barton Dubois, MD [facility] Pager [phone]   03/18/2019, 6:48 PM

## 2019-03-18 NOTE — Consult Note (Signed)
Reason for Consult: Acute kidney injury on chronic kidney disease stage IIIb/IV Referring Physician: Barton Dubois MD  HPI: (History obtained entirely from chart/daughter as patient has altered mentation) 85 year old African-American man with past medical history significant for CVA, dementia, hypertension, dyslipidemia, peripheral vascular disease, chronic colocutaneous fistula with indwelling drain and chronic kidney disease stage IIIb/IV at baseline (creatinine 1.9-2.1).  He was brought yesterday to the emergency room by his daughter with 2-week history of diarrhea and 4 day history of altered mental status accompanied by decreasing oral intake and rectal pain from wiping himself frequently.  His daughter denies any fever or chills, cough/sputum production or recent sick contacts.  She denies any hematochezia or melena and he has not had any nausea or vomiting.  In the emergency room noted to have acute kidney injury, hypotension and tachycardia for which he was admitted for sepsis management.  He is on broad-spectrum antimicrobial therapy with cefepime, ceftriaxone and metronidazole.  Earlier this morning, found to have recurrent fever and hypotension.  He has had some increased drainage around his colocutaneous fistula drain.  Past Medical History:  Diagnosis Date  . Arthritis   . BPH (benign prostatic hyperplasia)   . Carotid artery occlusion   . Dementia ([place])   . Dysphagia   . Hyperlipidemia   . Hypertension   . Peripheral vascular disease ([place])   . Stroke [facility])    Right hemispheric CVA  . Umbilical hernia     Past Surgical History:  Procedure Laterality Date  . CAROTID ENDARTERECTOMY  10/06/2007   Right CEA by Dr. Amedeo Plenty  . IR CATHETER TUBE CHANGE  09/15/2017  . IR CATHETER TUBE CHANGE  12/03/2017  . IR CATHETER TUBE CHANGE  12/23/2017  . IR CATHETER TUBE CHANGE  05/07/2018  . IR CATHETER TUBE CHANGE  06/29/2018  . IR CATHETER TUBE CHANGE  08/18/2018  . IR CATHETER TUBE CHANGE   09/08/2018  . IR CATHETER TUBE CHANGE  10/15/2018  . IR CATHETER TUBE CHANGE  01/26/2019  . IR GENERIC HISTORICAL  07/09/2016   IR RADIOLOGIST EVAL & MGMT 07/09/2016 Arne Cleveland, MD [facility] INTERV RAD  . IR RADIOLOGIST EVAL & MGMT  09/15/2017  . IR RADIOLOGIST EVAL & MGMT  05/06/2018  . PEG TUBE PLACEMENT  2008   after CVA    Family History  Problem Relation Age of Onset  . Obesity Daughter     Social History:  reports that he has quit smoking. He has never used smokeless tobacco. He reports that he does not drink alcohol or use drugs.  Allergies: No Known Allergies  Medications:  Scheduled: . aspirin EC  81 mg Oral Daily  . clopidogrel  75 mg Oral Daily  . levothyroxine  50 mcg Oral Daily  . sodium bicarbonate  650 mg Oral TID    BMP Latest Ref Rng & Units 03/18/2019 03/17/2019 06/14/2018  Glucose 70 - 99 mg/dL 81 98 109(H)  BUN 8 - 23 mg/dL 121(H) 120(H) 26(H)  Creatinine 0.61 - 1.24 mg/dL 5.83(H) 6.62(H) 1.92(H)  Sodium 135 - 145 mmol/L 146(H) 147(H) 142  Potassium 3.5 - 5.1 mmol/L 5.6(H) 5.4(H) 4.3  Chloride 98 - 111 mmol/L 121(H) 116(H) 108  CO2 22 - 32 mmol/L 12(L) 17(L) 24  Calcium 8.9 - 10.3 mg/dL 7.7(L) 8.3(L) 9.2   CBC Latest Ref Rng & Units 03/18/2019 03/17/2019 06/14/2018  WBC 4.0 - 10.5 K/uL 5.7 7.6 6.8  Hemoglobin 13.0 - 17.0 g/dL 11.5(L) 11.8(L) 11.5(L)  Hematocrit 39.0 - 52.0 %  39.1 40.4 34.1(L)  Platelets 150 - 400 K/uL 164 191 166.0   Urinalysis    Component Value Date/Time   COLORURINE YELLOW 03/18/2019 0800   APPEARANCEUR CLOUDY (A) 03/18/2019 0800   LABSPEC 1.011 03/18/2019 0800   PHURINE 5.0 03/18/2019 0800   GLUCOSEU NEGATIVE 03/18/2019 0800   GLUCOSEU NEGATIVE 12/30/2017 1443   HGBUR LARGE (A) 03/18/2019 0800   BILIRUBINUR NEGATIVE 03/18/2019 0800   KETONESUR NEGATIVE 03/18/2019 0800   PROTEINUR 30 (A) 03/18/2019 0800   UROBILINOGEN 0.2 12/30/2017 1443   NITRITE NEGATIVE 03/18/2019 0800   LEUKOCYTESUR LARGE (A) 03/18/2019 0800    Ct Abdomen  Pelvis Wo Contrast  Result Date: 03/18/2019 CLINICAL DATA:  85 year old male with history of dementia. Generalized weakness over the past week. EXAM: CT ABDOMEN AND PELVIS WITHOUT CONTRAST TECHNIQUE: Multidetector CT imaging of the abdomen and pelvis was performed following the standard protocol without IV contrast. COMPARISON:  CT the abdomen and pelvis 05/27/2018. FINDINGS: Lower chest: Heart size is normal. There is no significant pericardial fluid, thickening or pericardial calcification. There is aortic atherosclerosis, as well as atherosclerosis of the great vessels of the mediastinum and the coronary arteries, including calcified atherosclerotic plaque in the left main, left anterior descending, left circumflex and right coronary arteries. Calcifications of the aortic valve. Bibasilar areas of volume loss and architectural distortion with some peribronchovascular ground-glass attenuation, which may reflect sequela of mild aspiration. Hepatobiliary: No definite suspicious cystic or solid hepatic lesions are confidently identified on today's noncontrast CT examination. Multiple small calcified gallstones lying dependently in the gallbladder. No surrounding inflammatory changes to suggest an acute cholecystitis at this time. Pancreas: No definite pancreatic mass or peripancreatic fluid or inflammatory changes noted on today's noncontrast CT examination. Spleen: Unremarkable. Adrenals/Urinary Tract: Low-attenuation lesions in both kidneys, incompletely characterized on today's noncontrast CT examination, but statistically likely to represent cysts, measuring up to 3.7 cm in the upper pole of the right kidney. Bilateral adrenal glands are normal in appearance. No hydroureteronephrosis. Stomach/Bowel: Normal appearance of the stomach. No pathologic dilatation of small bowel or colon. Numerous colonic diverticulae are noted, without surrounding inflammatory changes to suggest an acute diverticulitis at this time.  Normal appendix. Adjacent to the mid sigmoid colon there is again a pigtail drainage catheter which is immediately anterior to the superior aspect of the urinary bladder and immediately deep to the anterior abdominal wall musculature. This is within a completely collapsed cavity. Vascular/Lymphatic: Aortic atherosclerosis. Aneurysmal dilatation of the common iliac arteries bilaterally measuring 3.1 cm on the right and 2.3 cm on the left. No lymphadenopathy noted in the abdomen or pelvis. Reproductive: Prostate gland and seminal vesicles are unremarkable in appearance. Other: No significant volume of ascites.  No pneumoperitoneum. Musculoskeletal: There are no aggressive appearing lytic or blastic lesions noted in the visualized portions of the skeleton. IMPRESSION: 1. There are no definite acute findings noted in the abdomen or pelvis to account for the patient's symptoms. 2. Dependent changes in the lung bases bilaterally concerning for sequela of mild aspiration. 3. Indwelling pigtail drainage catheter in the low anterior anatomic pelvis for chronic colocutaneous fistula redemonstrated. This is within a completely collapsed collection. No new abscess identified. 4. Colonic diverticulosis without evidence of acute diverticulitis at this time. 5. Aortic atherosclerosis, in addition to left main and 3 vessel coronary artery disease. 6. There are calcifications of the aortic valve. Echocardiographic correlation for evaluation of potential valvular dysfunction may be warranted if clinically indicated. Electronically Signed   By: Vinnie Langton  M.D.   On: 03/18/2019 09:15   Dg Chest Port 1 View  Result Date: 03/17/2019 CLINICAL DATA:  Diarrhea. EXAM: PORTABLE CHEST 1 VIEW COMPARISON:  No recent prior. FINDINGS: Stable cardiomegaly with normal pulmonary vascularity. Low lung volumes with mild basilar atelectasis. No pleural effusion or pneumothorax. Interposition of the colon under the right hemidiaphragm.  IMPRESSION: Stable cardiomegaly with normal pulmonary vascularity. Low lung volumes with mild basilar atelectasis. No focal infiltrate. Electronically Signed   By: Marcello Moores  Register   On: 03/17/2019 15:40    Review of Systems  Unable to perform ROS: Mental status change   Blood pressure (!) 98/50, pulse 82, temperature 100.1 F (37.8 C), temperature source Oral, resp. rate (!) 26, height 5\' 10"  (1.778 m), weight 87 kg, SpO2 96 %. Physical Exam  Nursing note and vitals reviewed. Constitutional: He appears well-developed and well-nourished.  Somnolent, groans to examination  HENT:  Head: Normocephalic and atraumatic.  Dry oral mucosa  Eyes: Pupils are equal, round, and reactive to light. Conjunctivae are normal. No scleral icterus.  Neck: No JVD present. No thyromegaly present.  Cardiovascular: Normal rate, regular rhythm and normal heart sounds.  No murmur heard. Respiratory: Effort normal. He has no wheezes. He has no rales.  Coarse/transmitted breath sounds bilaterally  GI: Soft. Bowel sounds are normal. There is no abdominal tenderness.  Left lower quadrant drain in situ with foul-smelling discharge/dressings  Musculoskeletal:        General: No edema.  Skin: Skin is warm and dry. No rash noted.    Assessment/Plan: 1.  Acute kidney injury on chronic kidney disease stage IIIb/IV: I suspect that this is primarily hemodynamically mediated in the setting of recent diarrhea/volume depletion with ongoing benazepril use.  I will send off for repeat urinalysis, urine electrolytes and adjust intravenous fluids.  He has a CT scan ordered of his abdomen (with p.o. contrast) and I will order a renal ultrasound to rule out any obstruction if adequate imaging not available.  Continue to avoid hypotension/iodinated intravenous contrast and nonsteroidal anti-inflammatory drugs.  He does not have any acute indications for dialysis. 2.  Hyperkalemia: Secondary to acute kidney injury and preceding ACE  inhibitor use.  Will treat with intravenous fluids and Lokelma. 3.  Mixed anion gap with non-gap metabolic acidosis: Secondary to losses from diarrhea as well as acute kidney injury.  We will give isotonic sodium bicarbonate for volume expansion/potassium shifting and discontinue oral sodium bicarbonate. 4.  Fever/hypotension: Ongoing evaluation for focus of infection-I suspect that with the foul odor from his colocutaneous fistula, this may indeed be the etiology.  He is on broad-spectrum antimicrobial coverage with ceftriaxone, cefepime and metronidazole. 5.  Hypernatremia: Indicative of poor water intake/fluid intake possibly associated with his altered mental status-we will give intravenous fluids.  Noma Quijas K. 03/18/2019, 9:50 AM

## 2019-03-18 NOTE — TOC Initial Note (Signed)
Transition of Care [facility]) - Initial/Assessment Note    Patient Details  Name: Robert Barnett MRN: [id number] Date of Birth: [birth year]  Transition [facility]) CM/SW Contact:    Sherald Barge, RN Phone Number: 03/18/2019, 11:06 AM  Clinical Narrative:        Pt on contact precautions. CM attempted contact with daugher vis phone both in pt room and on cell phone with no answer. CM will attempt contact again later today. Pt acutely ill and DC this weekend not anticipated. Palliative consult delayed to Monday. TOC team will cont to follow.             Expected Discharge Plan: [place]     Expected Discharge Plan and Services Expected Discharge Plan: [place] In-house Referral: Hospice / Palliative Care       Expected Discharge Date: 03/20/19                       Prior Living Arrangements/Services   Lives with:: Adult Children          Activities of Daily Living Home Assistive Devices/Equipment: None ADL Screening (condition at time of admission) Patient's cognitive ability adequate to safely complete daily activities?: No Is the patient deaf or have difficulty hearing?: Yes Does the patient have difficulty seeing, even when wearing glasses/contacts?: Yes Does the patient have difficulty concentrating, remembering, or making decisions?: Yes Patient able to express need for assistance with ADLs?: No Does the patient have difficulty dressing or bathing?: Yes Independently performs ADLs?: No Communication: Independent Dressing (OT): Dependent Is this a change from baseline?: Pre-admission baseline Grooming: Dependent Is this a change from baseline?: Pre-admission baseline Feeding: Dependent Is this a change from baseline?: Pre-admission baseline Bathing: Dependent Is this a change from baseline?: Pre-admission baseline Toileting: Dependent Is this a change from baseline?: Pre-admission baseline In/Out Bed:  Dependent Is this a change from baseline?: Pre-admission baseline Walks in Home: Dependent Is this a change from baseline?: Pre-admission baseline Does the patient have difficulty walking or climbing stairs?: Yes Weakness of Legs: Both Weakness of Arms/Hands: Both    Admission diagnosis:  Altered mental status, unspecified altered mental status type [R41.82] Diarrhea, unspecified type [R19.7] Sepsis with acute renal failure without septic shock, due to unspecified organism, unspecified acute renal failure type ([place]) [A41.9, R65.20, N17.9] Patient Active Problem List   Diagnosis Date Noted  . AKI (acute kidney injury) ([place]) 03/17/2019  . Mass of breast, right 11/17/2018  . Cachexia ([place]) 08/10/2018  . CVA, old, disturbances of vision 06/14/2018  . Chronic idiopathic constipation 06/14/2018  . Dietary folate deficiency anemia 06/14/2018  . Iron deficiency anemia due to dietary causes 06/14/2018  . Bowel and bladder incontinence 05/27/2018  . Pancreatic cyst   . Eczema 03/31/2018  . 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 CKD (chronic kidney disease) stage 4, GFR 15-29 ml/min (HCC) 09/03/2017  . AAA (abdominal aortic aneurysm) ([place]) 09/03/2017  . Essential hypertension 07/30/2017  . Abdominal aortic atherosclerosis ([place]) 08/19/2016  . Bilateral low back pain without sciatica 08/18/2016  . Hypothyroidism 07/23/2016  . Moderate dementia without behavioral disturbance ([place]) 07/23/2016  . Hyperlipidemia 11/17/2014  . BPH (benign prostatic hyperplasia)    PCP:  Janith Lima, MD Pharmacy:   [id number] - [place], [address] [place] [address] Phone: [phone] Fax: [phone]   Readmission Risk Interventions No flowsheet data found.

## 2019-03-19 ENCOUNTER — Inpatient Hospital Stay (HOSPITAL_COMMUNITY): Payer: Medicare Other

## 2019-03-19 DIAGNOSIS — R7989 Other specified abnormal findings of blood chemistry: Secondary | ICD-10-CM

## 2019-03-19 DIAGNOSIS — R4182 Altered mental status, unspecified: Secondary | ICD-10-CM | POA: Diagnosis present

## 2019-03-19 DIAGNOSIS — A419 Sepsis, unspecified organism: Secondary | ICD-10-CM

## 2019-03-19 DIAGNOSIS — Z515 Encounter for palliative care: Secondary | ICD-10-CM

## 2019-03-19 DIAGNOSIS — J690 Pneumonitis due to inhalation of food and vomit: Secondary | ICD-10-CM

## 2019-03-19 DIAGNOSIS — R6520 Severe sepsis without septic shock: Secondary | ICD-10-CM

## 2019-03-19 DIAGNOSIS — Z8673 Personal history of transient ischemic attack (TIA), and cerebral infarction without residual deficits: Secondary | ICD-10-CM

## 2019-03-19 DIAGNOSIS — R509 Fever, unspecified: Secondary | ICD-10-CM | POA: Diagnosis present

## 2019-03-19 DIAGNOSIS — I214 Non-ST elevation (NSTEMI) myocardial infarction: Secondary | ICD-10-CM

## 2019-03-19 DIAGNOSIS — F0390 Unspecified dementia without behavioral disturbance: Secondary | ICD-10-CM

## 2019-03-19 DIAGNOSIS — I2510 Atherosclerotic heart disease of native coronary artery without angina pectoris: Secondary | ICD-10-CM | POA: Diagnosis present

## 2019-03-19 DIAGNOSIS — I10 Essential (primary) hypertension: Secondary | ICD-10-CM

## 2019-03-19 DIAGNOSIS — R778 Other specified abnormalities of plasma proteins: Secondary | ICD-10-CM | POA: Diagnosis present

## 2019-03-19 DIAGNOSIS — Z7189 Other specified counseling: Secondary | ICD-10-CM

## 2019-03-19 DIAGNOSIS — R197 Diarrhea, unspecified: Secondary | ICD-10-CM | POA: Diagnosis present

## 2019-03-19 DIAGNOSIS — N321 Vesicointestinal fistula: Secondary | ICD-10-CM

## 2019-03-19 DIAGNOSIS — N184 Chronic kidney disease, stage 4 (severe): Secondary | ICD-10-CM

## 2019-03-19 DIAGNOSIS — N179 Acute kidney failure, unspecified: Principal | ICD-10-CM

## 2019-03-19 LAB — RENAL FUNCTION PANEL
Albumin: 1.9 g/dL — ABNORMAL LOW (ref 3.5–5.0)
Albumin: 1.5 g/dL — ABNORMAL LOW (ref 3.5–5.0)
Anion gap: 11 (ref 5–15)
Anion gap: 20 — ABNORMAL HIGH (ref 5–15)
BUN: 112 mg/dL — ABNORMAL HIGH (ref 8–23)
BUN: 102 mg/dL — AB (ref 8–23)
CALCIUM: 7.1 mg/dL — AB (ref 8.9–10.3)
CO2: 19 mmol/L — AB (ref 22–32)
CO2: 20 mmol/L — ABNORMAL LOW (ref 22–32)
Calcium: 7.8 mg/dL — ABNORMAL LOW (ref 8.9–10.3)
Chloride: 119 mmol/L — ABNORMAL HIGH (ref 98–111)
Chloride: 113 mmol/L — ABNORMAL HIGH (ref 98–111)
Creatinine, Ser: 4.85 mg/dL — ABNORMAL HIGH (ref 0.61–1.24)
Creatinine, Ser: 4.12 mg/dL — ABNORMAL HIGH (ref 0.61–1.24)
GFR calc Af Amer: 12 mL/min — ABNORMAL LOW (ref >60)
GFR calc Af Amer: 14 mL/min — ABNORMAL LOW (ref >60)
GFR calc non Af Amer: 10 mL/min — ABNORMAL LOW (ref >60)
GFR calc non Af Amer: 12 mL/min — ABNORMAL LOW (ref >60)
Glucose, Bld: 67 mg/dL — ABNORMAL LOW (ref 70–99)
Glucose, Bld: 72 mg/dL (ref 70–99)
Phosphorus: 5.2 mg/dL — ABNORMAL HIGH (ref 2.5–4.6)
Phosphorus: 5 mg/dL — ABNORMAL HIGH (ref 2.5–4.6)
Potassium: 4.3 mmol/L (ref 3.5–5.1)
Potassium: 4.7 mmol/L (ref 3.5–5.1)
Sodium: 149 mmol/L — ABNORMAL HIGH (ref 135–145)
Sodium: 153 mmol/L — ABNORMAL HIGH (ref 135–145)

## 2019-03-19 LAB — BASIC METABOLIC PANEL
Anion gap: 12 (ref 5–15)
BUN: 97 mg/dL — ABNORMAL HIGH (ref 8–23)
CO2: 23 mmol/L (ref 22–32)
Calcium: 7.4 mg/dL — ABNORMAL LOW (ref 8.9–10.3)
Chloride: 114 mmol/L — ABNORMAL HIGH (ref 98–111)
Creatinine, Ser: 3.88 mg/dL — ABNORMAL HIGH (ref 0.61–1.24)
GFR calc Af Amer: 15 mL/min — ABNORMAL LOW (ref >60)
GFR calc non Af Amer: 13 mL/min — ABNORMAL LOW (ref >60)
Glucose, Bld: 73 mg/dL (ref 70–99)
Potassium: 3.6 mmol/L (ref 3.5–5.1)
Sodium: 149 mmol/L — ABNORMAL HIGH (ref 135–145)

## 2019-03-19 LAB — GASTROINTESTINAL PANEL BY PCR, STOOL (REPLACES STOOL CULTURE)
Adenovirus F40/41: NOT DETECTED
Astrovirus: NOT DETECTED
Campylobacter species: NOT DETECTED
Cryptosporidium: NOT DETECTED
Cyclospora cayetanensis: NOT DETECTED
ENTEROPATHOGENIC E COLI (EPEC): DETECTED — AB
Entamoeba histolytica: NOT DETECTED
Enteroaggregative E coli (EAEC): NOT DETECTED
Enterotoxigenic E coli (ETEC): NOT DETECTED
Giardia lamblia: NOT DETECTED
Norovirus GI/GII: NOT DETECTED
Plesimonas shigelloides: NOT DETECTED
Rotavirus A: NOT DETECTED
Salmonella species: NOT DETECTED
Sapovirus (I, II, IV, and V): NOT DETECTED
Shiga like toxin producing E coli (STEC): NOT DETECTED
Shigella/Enteroinvasive E coli (EIEC): NOT DETECTED
Vibrio cholerae: NOT DETECTED
Vibrio species: NOT DETECTED
Yersinia enterocolitica: NOT DETECTED

## 2019-03-19 LAB — CBC WITH DIFFERENTIAL/PLATELET
Abs Immature Granulocytes: 0.17 10*3/uL — ABNORMAL HIGH (ref 0.00–0.07)
BASOS ABS: 0 10*3/uL (ref 0.0–0.1)
Basophils Relative: 1 %
Eosinophils Absolute: 0.1 10*3/uL (ref 0.0–0.5)
Eosinophils Relative: 1 %
HEMATOCRIT: 34 % — AB (ref 39.0–52.0)
Hemoglobin: 10.7 g/dL — ABNORMAL LOW (ref 13.0–17.0)
Immature Granulocytes: 3 %
LYMPHS ABS: 0.3 10*3/uL — AB (ref 0.7–4.0)
Lymphocytes Relative: 4 %
MCH: 32.4 pg (ref 26.0–34.0)
MCHC: 31.5 g/dL (ref 30.0–36.0)
MCV: 103 fL — ABNORMAL HIGH (ref 80.0–100.0)
Monocytes Absolute: 1.3 10*3/uL — ABNORMAL HIGH (ref 0.1–1.0)
Monocytes Relative: 22 %
NRBC: 0 % (ref 0.0–0.2)
Neutro Abs: 4.3 10*3/uL (ref 1.7–7.7)
Neutrophils Relative %: 69 %
Platelets: 142 10*3/uL — ABNORMAL LOW (ref 150–400)
RBC: 3.3 MIL/uL — ABNORMAL LOW (ref 4.22–5.81)
RDW: 17.7 % — ABNORMAL HIGH (ref 11.5–15.5)
WBC: 6.2 10*3/uL (ref 4.0–10.5)

## 2019-03-19 LAB — COMPREHENSIVE METABOLIC PANEL
ALT: 32 U/L (ref 0–44)
AST: 80 U/L — ABNORMAL HIGH (ref 15–41)
Albumin: 2 g/dL — ABNORMAL LOW (ref 3.5–5.0)
Alkaline Phosphatase: 64 U/L (ref 38–126)
Anion gap: 11 (ref 5–15)
BUN: 115 mg/dL — ABNORMAL HIGH (ref 8–23)
CHLORIDE: 120 mmol/L — AB (ref 98–111)
CO2: 18 mmol/L — ABNORMAL LOW (ref 22–32)
Calcium: 7.5 mg/dL — ABNORMAL LOW (ref 8.9–10.3)
Creatinine, Ser: 5.08 mg/dL — ABNORMAL HIGH (ref 0.61–1.24)
GFR calc Af Amer: 11 mL/min — ABNORMAL LOW (ref >60)
GFR calc non Af Amer: 10 mL/min — ABNORMAL LOW (ref >60)
Glucose, Bld: 74 mg/dL (ref 70–99)
Potassium: 4.5 mmol/L (ref 3.5–5.1)
Sodium: 149 mmol/L — ABNORMAL HIGH (ref 135–145)
Total Bilirubin: 0.7 mg/dL (ref 0.3–1.2)
Total Protein: 6.1 g/dL — ABNORMAL LOW (ref 6.5–8.1)

## 2019-03-19 LAB — PROTIME-INR
INR: 1.3 — ABNORMAL HIGH (ref 0.8–1.2)
Prothrombin Time: 16.3 seconds — ABNORMAL HIGH (ref 11.4–15.2)

## 2019-03-19 LAB — TROPONIN I
TROPONIN I: UNDETERMINED ng/mL (ref <0.03)
Troponin I: 2.75 ng/mL (ref <0.03)
Troponin I: 0.86 ng/mL (ref <0.03)
Troponin I: 0.67 ng/mL (ref <0.03)

## 2019-03-19 LAB — ECHOCARDIOGRAM LIMITED
Height: 70 in
Weight: 3174.62 oz

## 2019-03-19 LAB — LACTIC ACID, PLASMA: Lactic Acid, Venous: 1.6 mmol/L (ref 0.5–1.9)

## 2019-03-19 LAB — HEPARIN LEVEL (UNFRACTIONATED): Heparin Unfractionated: 0.13 IU/mL — ABNORMAL LOW (ref 0.30–0.70)

## 2019-03-19 LAB — APTT: aPTT: 32 seconds (ref 24–36)

## 2019-03-19 MED ORDER — DEXTROSE-NACL 5-0.2 % IV SOLN
INTRAVENOUS | Status: DC
  Administered 2019-03-20: 02:00:00 via INTRAVENOUS
  Filled 2019-03-19: qty 1000

## 2019-03-19 MED ORDER — IPRATROPIUM BROMIDE 0.02 % IN SOLN
0.5000 mg | Freq: Four times a day (QID) | RESPIRATORY_TRACT | Status: DC
  Administered 2019-03-19 – 2019-03-20 (×7): 0.5 mg via RESPIRATORY_TRACT
  Filled 2019-03-19 (×7): qty 2.5

## 2019-03-19 MED ORDER — SODIUM CHLORIDE 0.9 % IV SOLN
1.0000 g | Freq: Two times a day (BID) | INTRAVENOUS | Status: DC
  Administered 2019-03-19 – 2019-03-23 (×8): 1 g via INTRAVENOUS
  Filled 2019-03-19 (×8): qty 1

## 2019-03-19 MED ORDER — ASPIRIN 300 MG RE SUPP
300.0000 mg | Freq: Once | RECTAL | Status: AC
  Administered 2019-03-19: 300 mg via RECTAL
  Filled 2019-03-19: qty 1

## 2019-03-19 MED ORDER — HEPARIN SODIUM (PORCINE) 5000 UNIT/ML IJ SOLN
5000.0000 [IU] | Freq: Three times a day (TID) | INTRAMUSCULAR | Status: DC
  Administered 2019-03-19 – 2019-03-24 (×13): 5000 [IU] via SUBCUTANEOUS
  Filled 2019-03-19 (×13): qty 1

## 2019-03-19 MED ORDER — ORAL CARE MOUTH RINSE
15.0000 mL | Freq: Two times a day (BID) | OROMUCOSAL | Status: DC
  Administered 2019-03-19 – 2019-03-23 (×6): 15 mL via OROMUCOSAL

## 2019-03-19 MED ORDER — CHLORHEXIDINE GLUCONATE 0.12 % MT SOLN
15.0000 mL | Freq: Two times a day (BID) | OROMUCOSAL | Status: DC
  Administered 2019-03-19 – 2019-03-24 (×10): 15 mL via OROMUCOSAL
  Filled 2019-03-19 (×10): qty 15

## 2019-03-19 MED ORDER — IPRATROPIUM-ALBUTEROL 0.5-2.5 (3) MG/3ML IN SOLN
3.0000 mL | Freq: Four times a day (QID) | RESPIRATORY_TRACT | Status: DC
  Administered 2019-03-19: 3 mL via RESPIRATORY_TRACT
  Filled 2019-03-19: qty 3

## 2019-03-19 MED ORDER — SODIUM CHLORIDE 0.9 % IV BOLUS
250.0000 mL | Freq: Once | INTRAVENOUS | Status: AC
  Administered 2019-03-19: 250 mL via INTRAVENOUS

## 2019-03-19 MED ORDER — SODIUM CHLORIDE 0.45 % IV SOLN
INTRAVENOUS | Status: DC
  Administered 2019-03-19: 17:00:00 via INTRAVENOUS
  Filled 2019-03-19: qty 1000

## 2019-03-19 MED ORDER — SODIUM CHLORIDE 0.9 % IV SOLN
1.0000 g | Freq: Once | INTRAVENOUS | Status: AC
  Administered 2019-03-19: 1 g via INTRAVENOUS
  Filled 2019-03-19: qty 1

## 2019-03-19 MED ORDER — MAGNESIUM SULFATE 2 GM/50ML IV SOLN
2.0000 g | Freq: Once | INTRAVENOUS | Status: AC
  Administered 2019-03-19: 2 g via INTRAVENOUS
  Filled 2019-03-19: qty 50

## 2019-03-19 MED ORDER — HEPARIN (PORCINE) 25000 UT/250ML-% IV SOLN
1250.0000 [IU]/h | INTRAVENOUS | Status: DC
  Administered 2019-03-19: 1050 [IU]/h via INTRAVENOUS
  Filled 2019-03-19 (×2): qty 250

## 2019-03-19 MED ORDER — SODIUM CHLORIDE 0.45 % IV BOLUS
1000.0000 mL | Freq: Once | INTRAVENOUS | Status: AC
  Administered 2019-03-19: 1000 mL via INTRAVENOUS

## 2019-03-19 MED ORDER — HEPARIN BOLUS VIA INFUSION
2000.0000 [IU] | Freq: Once | INTRAVENOUS | Status: AC
  Administered 2019-03-19: 2000 [IU] via INTRAVENOUS
  Filled 2019-03-19: qty 2000

## 2019-03-19 MED ORDER — NITROGLYCERIN 0.4 MG SL SUBL: 0.4000 mg | SUBLINGUAL_TABLET | SUBLINGUAL | Status: DC | PRN

## 2019-03-19 MED ORDER — LEVALBUTEROL HCL 0.63 MG/3ML IN NEBU
0.6300 mg | INHALATION_SOLUTION | Freq: Four times a day (QID) | RESPIRATORY_TRACT | Status: DC
  Administered 2019-03-19 – 2019-03-20 (×7): 0.63 mg via RESPIRATORY_TRACT
  Filled 2019-03-19 (×7): qty 3

## 2019-03-19 NOTE — Progress Notes (Signed)
Pharmacy Antibiotic Note  Robert Barnett is a 85 y.o. male admitted on 03/17/2019 with aspiration pneumonia.  Pharmacy has been consulted for meropenem dosing.  Plan: Meropenem 1gm IV q 12hr  Height: 5\' 10"  (177.8 cm) Weight: 191 lb 12.8 oz (87 kg) IBW/kg (Calculated) : 73  Temp (24hrs), Avg:100.1 F (37.8 C), Min:98.2 F (36.8 C), Max:102.9 F (39.4 C)  Recent Labs  Lab 03/17/19 1625 03/17/19 2112 03/18/19 0217 03/18/19 1648 03/19/19 0023 03/19/19 0208  WBC 7.6  --  5.7  --  6.2  --   CREATININE 6.62*  --  5.83* 5.14* 5.08* 4.85*  LATICACIDVEN 2.4* 2.3* 1.7  --  1.6  --     Estimated Creatinine Clearance: 11.7 mL/min (A) (by C-G formula based on SCr of 4.85 mg/dL (H)).    No Known Allergies  Antimicrobials this admission: ceftrtiaxone 2 gm IV q24h  3/19- 3/20 Metronidazole 500mg  IV q 8hr  3/20 for 3 doses  Dose adjustments this admission: Meropenem dosed q12 hr in this pt with crcl ~ 50ml/min  Microbiology results: 3/19 BCx: x 2 NGTD   Thank you for allowing pharmacy to be a part of this patient's care.  Wyline Mood 03/19/2019 3:35 AM

## 2019-03-19 NOTE — Progress Notes (Signed)
RT orally suctioned patient without any complications removing moderate amount of thick white secretions.

## 2019-03-19 NOTE — Progress Notes (Signed)
ANTICOAGULATION CONSULT NOTE  Pharmacy Consult for Heparin dosing Indication: chest pain/ACS  No Known Allergies  Patient Measurements: Height: 5\' 10"  (177.8 cm) Weight: 198 lb 6.6 oz (90 kg) IBW/kg (Calculated) : 73 Heparin Dosing Weight: 87 kg  Vital Signs: Temp: 97.8 F (36.6 C) (03/21 1113) Temp Source: Oral (03/21 1113) BP: 95/46 (03/21 1113) Pulse Rate: 68 (03/21 1113)  Labs: Recent Labs    03/17/19 1625 03/18/19 0217 03/18/19 1648 03/19/19 0023 03/19/19 0208 03/19/19 0906 03/19/19 1146  HGB 11.8* 11.5*  --  10.7*  --   --   --   HCT 40.4 39.1  --  34.0*  --   --   --   PLT 191 164  --  142*  --   --   --   APTT  --   --   --   --  32  --   --   LABPROT  --   --   --   --  16.3*  --   --   INR  --   --   --   --  1.3*  --   --   HEPARINUNFRC  --   --   --   --   --   --  0.13*  CREATININE 6.62* 5.83* 5.14* 5.08* 4.85*  --   --   TROPONINI  --   --   --  2.75*  --  QUANTITY NOT SUFFICIENT, UNABLE TO PERFORM TEST  --     Estimated Creatinine Clearance: 12.8 mL/min (A) (by C-G formula based on SCr of 4.85 mg/dL (H)).   Medical History: Past Medical History:  Diagnosis Date  . Arthritis   . BPH (benign prostatic hyperplasia)   . Carotid artery occlusion   . Dementia ([place])   . Dysphagia   . Hyperlipidemia   . Hypertension   . Peripheral vascular disease ([place])   . Stroke [facility])    Right hemispheric CVA  . Umbilical hernia     Medications:  Scheduled:  . aspirin EC  81 mg Oral Daily  . clopidogrel  75 mg Oral Daily  . ipratropium  0.5 mg Nebulization Q6H  . levalbuterol  0.63 mg Nebulization Q6H  . levothyroxine  50 mcg Oral Daily   Infusions:  . heparin 1,050 Units/hr (03/19/19 0344)  . meropenem (MERREM) IV    .  sodium bicarbonate (isotonic) infusion in sterile water 150 mL/hr at 03/19/19 1218   PRN: acetaminophen **OR** acetaminophen, fentaNYL (SUBLIMAZE) injection, nitroGLYCERIN, ondansetron **OR** ondansetron (ZOFRAN)  IV  Assessment: Pharmacy consulted to dose heparin in patient with  ACS/STEMI, who is admitted with concern for aspiration pneumonia.  Initial heparin level subtherapeutic at 0.13 on heparin 1050 units/hr. Hb relatively stable. No signs/symptoms of bleeding or issues with infusion reported by nursing.  Goal of Therapy:  Heparin level 0.3-0.7 units/ml Monitor platelets by anticoagulation protocol: Yes   Plan:  Give 2000 units bolus x 1 Increase heparin infusion to 1250 units/hr Check anti-Xa level in 8 hours and daily while on heparin Continue to monitor H&H and platelets   Claiborne Billings, PharmD PGY2 Cardiology Pharmacy Resident Please check AMION for all Pharmacist numbers by unit 03/19/2019 12:33 PM

## 2019-03-19 NOTE — Progress Notes (Signed)
PT Cancellation Note  Patient Details Name: Sequoyah Counterman MRN: [id number] DOB: [birth year]   Cancelled Treatment:    Reason Eval/Treat Not Completed: Patient not medically ready. Critical troponin level this AM. Will follow-up for PT evaluation as appropriate.  Mabeline Caras, PT, DPT Acute Rehabilitation Services  Pager [phone] Office [place] 03/19/2019, 7:59 AM

## 2019-03-19 NOTE — Consult Note (Addendum)
Cardiology Consultation:   Patient ID: Robert Barnett MRN: [id number]; DOB: 1932-08-05  Admit date: 03/17/2019 Date of Consult: 03/19/2019  Primary Care Provider: Janith Lima, MD Primary Cardiologist: No primary care provider on file. Primary Electrophysiologist:  None    Patient Profile:   Robert Barnett is a 85 y.o. male with a hx of dementia post CVA who is being seen today for the evaluation of elevated troponin at the request of Dr Nevada Crane.  History of Present Illness:   Robert Barnett is an 85 year old male from [place].  He had a stroke in 2008.  He has moderate dementia without behavioral disturbance.  He is cared for by his family.  He has not had prior documented cardiac disease or prior cardiac evaluation.  He was in his usual state of health until about 2 weeks ago when the patient's daughter noticed he was having some loose stools.  He was scheduled to see his PCP as an outpatient but in the interim he had decreased responsiveness at home and was taken to [facility].  On arrival to the emergency room he was febrile with a fever of 102.  He had acute on chronic renal insufficiency with a creatinine bumped to 6, his usual creatinine is in the 3-4 range.  Since admission a troponin level came back elevated at 2.75.  His EKG showed no acute changes. CT of his abdomin showed 4V CAD.  The family requested transfer to [facility]. Up unitl he recently became ill the family states he did pretty well at home, he would go out to eat with them and going to church. The patient is currently not responsive to voice.  The family says no history of recent chest pain.  He did complain of back pain a few days ago but couldn't get specifics from him.    Past Medical History:  Diagnosis Date  . Arthritis   . BPH (benign prostatic hyperplasia)   . Carotid artery occlusion   . Dementia ([place])   . Dysphagia   . Hyperlipidemia   . Hypertension   . Peripheral vascular  disease ([place])   . Stroke [facility])    Right hemispheric CVA  . Umbilical hernia     Past Surgical History:  Procedure Laterality Date  . CAROTID ENDARTERECTOMY  10/06/2007   Right CEA by Dr. Amedeo Plenty  . IR CATHETER TUBE CHANGE  09/15/2017  . IR CATHETER TUBE CHANGE  12/03/2017  . IR CATHETER TUBE CHANGE  12/23/2017  . IR CATHETER TUBE CHANGE  05/07/2018  . IR CATHETER TUBE CHANGE  06/29/2018  . IR CATHETER TUBE CHANGE  08/18/2018  . IR CATHETER TUBE CHANGE  09/08/2018  . IR CATHETER TUBE CHANGE  10/15/2018  . IR CATHETER TUBE CHANGE  01/26/2019  . IR GENERIC HISTORICAL  07/09/2016   IR RADIOLOGIST EVAL & MGMT 07/09/2016 Arne [place], MD [facility] INTERV RAD  . IR RADIOLOGIST EVAL & MGMT  09/15/2017  . IR RADIOLOGIST EVAL & MGMT  05/06/2018  . PEG TUBE PLACEMENT  2008   after CVA     Home Medications:  Prior to Admission medications   Medication Sig Start Date End Date Taking? Authorizing Provider  aspirin 81 MG tablet Take 1 tablet (81 mg total) by mouth daily. 06/14/18  Yes Janith Lima, MD  benazepril (LOTENSIN) 40 MG tablet TAKE 1 TABLET BY MOUTH EVERY DAY 09/18/18  Yes Janith Lima, MD  clopidogrel (PLAVIX) 75 MG tablet TAKE 1 TABLET  BY MOUTH EVERY DAY 01/11/19  Yes Janith Lima, MD  Colchicine 0.6 MG CAPS TAKE 1 CAPSULE BY MOUTH TWICE A DAY 11/26/18  Yes Janith Lima, MD  donepezil (ARICEPT) 10 MG tablet Take 1 tablet by mouth at bedtime. 12/28/18  Yes [provider]  dronabinol (MARINOL) 2.5 MG capsule Take 1 capsule (2.5 mg total) by mouth 2 (two) times daily before lunch and supper. 08/10/18  Yes Janith Lima, MD  ferrous sulfate 325 (65 FE) MG EC tablet TAKE 1 TABLET BY MOUTH EVERY DAY WITH BREAKFAST 12/08/18  Yes Janith Lima, MD  levothyroxine (SYNTHROID, LEVOTHROID) 50 MCG tablet TAKE 1 TABLET (50 MCG TOTAL) BY MOUTH DAILY. 12/30/18  Yes Janith Lima, MD  simvastatin (ZOCOR) 20 MG tablet Take 1 tablet (20 mg total) by mouth daily. 10/01/18  Yes Janith Lima, MD   thiamine 100 MG tablet TAKE 1 TABLET BY MOUTH EVERY DAY 01/03/19  Yes Janith Lima, MD  triamcinolone cream (KENALOG) 0.5 % Apply 1 application topically 2 (two) times daily. 03/31/18  Yes Janith Lima, MD  folic acid (FOLVITE) 1 MG tablet TAKE 1 TABLET BY MOUTH EVERY DAY Patient not taking: Reported on 03/17/2019 12/08/18   Janith Lima, MD    Inpatient Medications: Scheduled Meds: . aspirin EC  81 mg Oral Daily  . clopidogrel  75 mg Oral Daily  . heparin  2,000 Units Intravenous Once  . ipratropium  0.5 mg Nebulization Q6H  . levalbuterol  0.63 mg Nebulization Q6H  . levothyroxine  50 mcg Oral Daily   Continuous Infusions: . heparin 1,050 Units/hr (03/19/19 0344)  . meropenem (MERREM) IV    .  sodium bicarbonate (isotonic) infusion in sterile water 150 mL/hr at 03/19/19 1218   PRN Meds: acetaminophen **OR** acetaminophen, fentaNYL (SUBLIMAZE) injection, nitroGLYCERIN, ondansetron **OR** ondansetron (ZOFRAN) IV  Allergies:   No Known Allergies  Social History:   Social History   Socioeconomic History  . Marital status: Widowed    Spouse name: Not on file  . Number of children: Not on file  . Years of education: Not on file  . Highest education level: Not on file  Occupational History  . Occupation: Retired  Scientific laboratory technician  . Financial resource strain: Not on file  . Food insecurity:    Worry: Not on file    Inability: Not on file  . Transportation needs:    Medical: Not on file    Non-medical: Not on file  Tobacco Use  . Smoking status: Former Research scientist (life sciences)  . Smokeless tobacco: Never Used  . Tobacco comment: remote h/o  Substance and Sexual Activity  . Alcohol use: No  . Drug use: No  . Sexual activity: Not on file  Lifestyle  . Physical activity:    Days per week: Not on file    Minutes per session: Not on file  . Stress: Not on file  Relationships  . Social connections:    Talks on phone: Not on file    Gets together: Not on file    Attends religious  service: Not on file    Active member of club or organization: Not on file    Attends meetings of clubs or organizations: Not on file    Relationship status: Not on file  . Intimate partner violence:    Fear of current or ex partner: Not on file    Emotionally abused: Not on file    Physically abused: Not on file  Forced sexual activity: Not on file  Other Topics Concern  . Not on file  Social History Narrative  . Not on file    Family History:    Family History  Problem Relation Age of Onset  . Obesity Daughter      ROS:  Please see the history of present illness.  Unobtainable secondary non communicative dementia All other ROS reviewed and negative.     Physical Exam/Data:   Vitals:   03/19/19 0556 03/19/19 0807 03/19/19 0824 03/19/19 1113  BP: (!) 101/57 (!) 89/55 (!) 110/56 (!) 95/46  Pulse: 73 67 71 68  Resp: (!) 22 19 18  (!) 21  Temp: 97.8 F (36.6 C) 97.9 F (36.6 C) 97.6 F (36.4 C) 97.8 F (36.6 C)  TempSrc: Axillary Oral Oral Oral  SpO2: 98% 98% 99% 95%  Weight: 90 kg     Height: 5\' 10"  (1.778 m)       Intake/Output Summary (Last 24 hours) at 03/19/2019 1243 Last data filed at 03/19/2019 1100 Gross per 24 hour  Intake 3793.06 ml  Output 1025 ml  Net 2768.06 ml   Last 3 Weights 03/19/2019 03/19/2019 03/17/2019  Weight (lbs) 198 lb 6.6 oz 191 lb 12.8 oz 191 lb 12.8 oz  Weight (kg) 90 kg 87 kg 87 kg     Body mass index is 28.47 kg/m.  General: elderly AA male- NAD HEENT: poor dentition Lymph: no adenopathy Neck: no JVD Endocrine:  No thryomegaly Vascular: No carotid bruits Cardiac:  normal S1, S2; RRR; no murmur  Lungs:  Decreased breath sounds  Abd: soft, nontender, no hepatomegaly, colovesical fistula with drain in place  Ext: no edema Musculoskeletal:  No deformities, BUE and BLE strength normal and equal Skin: warm and dry  Neuro:  Responds to physical stimuli Psych:  Not responsive to verbal stimulation  EKG:  The EKG was personally  reviewed and demonstrates:  NSR, NSST changes Telemetry:  Telemetry was personally reviewed and demonstrates:  NSR, PVCs  Relevant CV Studies:   Laboratory Data:  Chemistry Recent Labs  Lab 03/18/19 1648 03/19/19 0023 03/19/19 0208  NA 146* 149* 149*  K 4.8 4.5 4.3  CL 118* 120* 119*  CO2 16* 18* 19*  GLUCOSE 80 74 67*  BUN 116* 115* 112*  CREATININE 5.14* 5.08* 4.85*  CALCIUM 7.6* 7.5* 7.1*  GFRNONAA 10* 10* 10*  GFRAA 11* 11* 12*  ANIONGAP 12 11 11     Recent Labs  Lab 03/17/19 1625 03/19/19 0023 03/19/19 0208  PROT 7.1 6.1*  --   ALBUMIN 2.6* 2.0* 1.9*  AST 61* 80*  --   ALT 36 32  --   ALKPHOS 68 64  --   BILITOT 0.5 0.7  --    Hematology Recent Labs  Lab 03/17/19 1625 03/18/19 0217 03/19/19 0023  WBC 7.6 5.7 6.2  RBC 3.68* 3.56* 3.30*  HGB 11.8* 11.5* 10.7*  HCT 40.4 39.1 34.0*  MCV 109.8* 109.8* 103.0*  MCH 32.1 32.3 32.4  MCHC 29.2* 29.4* 31.5  RDW 18.1* 17.9* 17.7*  PLT 191 164 142*   Cardiac Enzymes Recent Labs  Lab 03/19/19 0023 03/19/19 0906  TROPONINI 2.75* QUANTITY NOT SUFFICIENT, UNABLE TO PERFORM TEST   No results for input(s): TROPIPOC in the last 168 hours.  BNPNo results for input(s): BNP, PROBNP in the last 168 hours.  DDimer No results for input(s): DDIMER in the last 168 hours.  Radiology/Studies:  Ct Abdomen Pelvis Wo Contrast  Result Date: 03/18/2019 CLINICAL DATA:  85 year old male with history of dementia. Generalized weakness over the past week. EXAM: CT ABDOMEN AND PELVIS WITHOUT CONTRAST TECHNIQUE: Multidetector CT imaging of the abdomen and pelvis was performed following the standard protocol without IV contrast. COMPARISON:  CT the abdomen and pelvis 05/27/2018. FINDINGS: Lower chest: Heart size is normal. There is no significant pericardial fluid, thickening or pericardial calcification. There is aortic atherosclerosis, as well as atherosclerosis of the great vessels of the mediastinum and the coronary arteries,  including calcified atherosclerotic plaque in the left main, left anterior descending, left circumflex and right coronary arteries. Calcifications of the aortic valve. Bibasilar areas of volume loss and architectural distortion with some peribronchovascular ground-glass attenuation, which may reflect sequela of mild aspiration. Hepatobiliary: No definite suspicious cystic or solid hepatic lesions are confidently identified on today's noncontrast CT examination. Multiple small calcified gallstones lying dependently in the gallbladder. No surrounding inflammatory changes to suggest an acute cholecystitis at this time. Pancreas: No definite pancreatic mass or peripancreatic fluid or inflammatory changes noted on today's noncontrast CT examination. Spleen: Unremarkable. Adrenals/Urinary Tract: Low-attenuation lesions in both kidneys, incompletely characterized on today's noncontrast CT examination, but statistically likely to represent cysts, measuring up to 3.7 cm in the upper pole of the right kidney. Bilateral adrenal glands are normal in appearance. No hydroureteronephrosis. Stomach/Bowel: Normal appearance of the stomach. No pathologic dilatation of small bowel or colon. Numerous colonic diverticulae are noted, without surrounding inflammatory changes to suggest an acute diverticulitis at this time. Normal appendix. Adjacent to the mid sigmoid colon there is again a pigtail drainage catheter which is immediately anterior to the superior aspect of the urinary bladder and immediately deep to the anterior abdominal wall musculature. This is within a completely collapsed cavity. Vascular/Lymphatic: Aortic atherosclerosis. Aneurysmal dilatation of the common iliac arteries bilaterally measuring 3.1 cm on the right and 2.3 cm on the left. No lymphadenopathy noted in the abdomen or pelvis. Reproductive: Prostate gland and seminal vesicles are unremarkable in appearance. Other: No significant volume of ascites.  No  pneumoperitoneum. Musculoskeletal: There are no aggressive appearing lytic or blastic lesions noted in the visualized portions of the skeleton. IMPRESSION: 1. There are no definite acute findings noted in the abdomen or pelvis to account for the patient's symptoms. 2. Dependent changes in the lung bases bilaterally concerning for sequela of mild aspiration. 3. Indwelling pigtail drainage catheter in the low anterior anatomic pelvis for chronic colocutaneous fistula redemonstrated. This is within a completely collapsed collection. No new abscess identified. 4. Colonic diverticulosis without evidence of acute diverticulitis at this time. 5. Aortic atherosclerosis, in addition to left main and 3 vessel coronary artery disease. 6. There are calcifications of the aortic valve. Echocardiographic correlation for evaluation of potential valvular dysfunction may be warranted if clinically indicated. Electronically Signed   By: Vinnie Langton M.D.   On: 03/18/2019 09:15   Dg Chest Port 1 View  Result Date: 03/19/2019 CLINICAL DATA:  Dyspnea, multiple episodes of loose stools, hypothyroidism, dementia, chronic kidney disease EXAM: PORTABLE CHEST 1 VIEW COMPARISON:  Portable exam 0020 hours compared to 03/17/2019 FINDINGS: Normal heart size mediastinal contours. BILATERAL lower lobe infiltrates greater on RIGHT consistent with pneumonia. Upper lungs clear. No pleural effusion or pneumothorax. Osseous structures unremarkable. IMPRESSION: Bibasilar infiltrates consistent with pneumonia, greater on RIGHT. Electronically Signed   By: Lavonia Dana M.D.   On: 03/19/2019 00:35   Dg Chest Port 1 View  Result Date: 03/17/2019 CLINICAL DATA:  Diarrhea. EXAM: PORTABLE CHEST 1 VIEW COMPARISON:  No  recent prior. FINDINGS: Stable cardiomegaly with normal pulmonary vascularity. Low lung volumes with mild basilar atelectasis. No pleural effusion or pneumothorax. Interposition of the colon under the right hemidiaphragm. IMPRESSION:  Stable cardiomegaly with normal pulmonary vascularity. Low lung volumes with mild basilar atelectasis. No focal infiltrate. Electronically Signed   By: Marcello Moores  Register   On: 03/17/2019 15:40    Assessment and Plan:   Elevated Troponin Significance in the setting of AKI and possible sepsis is unclear.   Dementia Moderate dementia prior to admission per family, recent acute decompensation   H/O stroke Right brain stroke 2008-on Plavix and ASA 81 mg prior to admission  CRI-4  With AKI on admission secondary to dehydration- ACE D/C'd  Diarrhea and fever Dehydrated on admission- Rx per attending service    Plan:  Echo was ordered by attending service but then cancelled- will review with MD.  I suspect no aggressive work up will be recommended but an echo may help with guiding treatment and prognosis.   For questions or updates, please contact [place] Please consult [email or web address] for contact info under     Signed, Kerin Ransom, PA-C  03/19/2019 12:43 PM   I have seen and examined the patient along with Kerin Ransom, PA-C .  I have reviewed the chart, notes and new data.  I agree with PA/NP's note.  Key new complaints: There are no reported complaints of dyspnea or angina, with the caveat that his dementia prevents detailed review of systems Key examination changes: No overt findings to suggest congestive heart failure, no arrhythmia.  He was febrile on admission. Key new findings / data: Acute kidney injury secondary to hypovolemia  PLAN: The isolated abnormal troponin, without significant ECG changes and without any cardiovascular complaints is of questionable value.  Suspect demand ischemia in the setting of acute infectious illness and hemodynamic fluctuation due to hypovolemia.  He is obviously not a great candidate for invasive evaluation or revascularization, even if he were to have significant underlying coronary artery stenoses.  The current clinical presentation is not  consistent with an acute coronary event and I do not think further evaluation is recommended during this hospitalization.  He remains borderline hypotensive and I would not recommend initiation of beta-blocker therapy.  Sanda Klein, MD, [place] [phone] 03/19/2019, 1:29 PM

## 2019-03-19 NOTE — Progress Notes (Signed)
Subjective:  Transferred to [facility] per family request.  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 He was brought to [facility] on 3/19 with diarrhea and worsening MS- found to have NSTEMI and A on CRF (baseline crt 2) as well as may have aspirated while at other hospital.  Abdominal CT did not show hydro.  Urine looks like UTI.  UOP picking up some- 525 last 24 hours- crt coming down slowly - BP still low.  3 liters positive     Objective Vital signs in last 24 hours: Vitals:   03/19/19 0137 03/19/19 0324 03/19/19 0400 03/19/19 0556  BP: 102/61 (!) 84/49  (!) 101/57  Pulse: 85 77  73  Resp: (!) 30 (!) 38  (!) 22  Temp: 98.2 F (36.8 C)  (!) 97.1 F (36.2 C) 97.8 F (36.6 C)  TempSrc: Axillary  Axillary Axillary  SpO2: 94% 95%  98%  Weight:    90 kg  Height:    5\' 10"  (1.778 m)   Weight change: 0 kg  Intake/Output Summary (Last 24 hours) at 03/19/2019 0805 Last data filed at 03/19/2019 0500 Gross per 24 hour  Intake 3793.06 ml  Output 525 ml  Net 3268.06 ml    Assessment/Plan: 85 year old BM with dementia, HTN.  Now has suffered an NSTEMI, likely aspiration PNA, UTI and A on CRF 1.  Acute kidney injury on chronic kidney disease stage IIIb/IV: I suspect that this is primarily hemodynamically mediated in the setting of recent diarrhea/volume depletion with ongoing benazepril use.   urinalysis looks like UTI- culture pending and on meropenam. Imaging does not show hydro.  Continue to avoid hypotension/iodinated intravenous contrast and nonsteroidal anti-inflammatory drugs.  He does not have any acute indications for dialysis, improving slowly for now, hope this will continue to be the case. 2.  Hyperkalemia: resolved 3.  Mixed anion gap with non-gap metabolic acidosis: Secondary to losses from diarrhea as well as acute kidney injury.  Giving isotonic sodium bicarbonate for volume expansion/potassium shifting. 4.  Fever/hypotension:  Ongoing evaluation for focus of infection-UTI/PNA.  He is on meropenam- blood cultures negative. Is not overloaded- agree with IVF.  ACE being held  5.  Hypernatremia: Indicative of poor water intake/fluid intake possibly associated with his altered mental status-has been bolused with 1/2 NS today - will check chemistries later in the day     [place]: Basic Metabolic Panel: Recent Labs  Lab 03/18/19 1648 03/19/19 0023 03/19/19 0208  NA 146* 149* 149*  K 4.8 4.5 4.3  CL 118* 120* 119*  CO2 16* 18* 19*  GLUCOSE 80 74 67*  BUN 116* 115* 112*  CREATININE 5.14* 5.08* 4.85*  CALCIUM 7.6* 7.5* 7.1*  PHOS  --   --  5.2*   Liver Function Tests: Recent Labs  Lab 03/17/19 1625 03/19/19 0023 03/19/19 0208  AST 61* 80*  --   ALT 36 32  --   ALKPHOS 68 64  --   BILITOT 0.5 0.7  --   PROT 7.1 6.1*  --   ALBUMIN 2.6* 2.0* 1.9*   Recent Labs  Lab 03/17/19 1625  LIPASE 72*   No results for input(s): AMMONIA in the last 168 hours. CBC: Recent Labs  Lab 03/17/19 1625 03/18/19 0217 03/19/19 0023  WBC 7.6 5.7 6.2  NEUTROABS 5.1  --  4.3  HGB 11.8* 11.5* 10.7*  HCT 40.4 39.1 34.0*  MCV  109.8* 109.8* 103.0*  PLT 191 164 142*   Cardiac Enzymes: Recent Labs  Lab 03/19/19 0023  TROPONINI 2.75*   CBG: No results for input(s): GLUCAP in the last 168 hours.  Iron Studies: No results for input(s): IRON, TIBC, TRANSFERRIN, FERRITIN in the last 72 hours. Studies/Results: Ct Abdomen Pelvis Wo Contrast  Result Date: 03/18/2019 CLINICAL DATA:  85 year old male with history of dementia. Generalized weakness over the past week. EXAM: CT ABDOMEN AND PELVIS WITHOUT CONTRAST TECHNIQUE: Multidetector CT imaging of the abdomen and pelvis was performed following the standard protocol without IV contrast. COMPARISON:  CT the abdomen and pelvis 05/27/2018. FINDINGS: Lower chest: Heart size is normal. There is no significant pericardial fluid, thickening or pericardial  calcification. There is aortic atherosclerosis, as well as atherosclerosis of the great vessels of the mediastinum and the coronary arteries, including calcified atherosclerotic plaque in the left main, left anterior descending, left circumflex and right coronary arteries. Calcifications of the aortic valve. Bibasilar areas of volume loss and architectural distortion with some peribronchovascular ground-glass attenuation, which may reflect sequela of mild aspiration. Hepatobiliary: No definite suspicious cystic or solid hepatic lesions are confidently identified on today's noncontrast CT examination. Multiple small calcified gallstones lying dependently in the gallbladder. No surrounding inflammatory changes to suggest an acute cholecystitis at this time. Pancreas: No definite pancreatic mass or peripancreatic fluid or inflammatory changes noted on today's noncontrast CT examination. Spleen: Unremarkable. Adrenals/Urinary Tract: Low-attenuation lesions in both kidneys, incompletely characterized on today's noncontrast CT examination, but statistically likely to represent cysts, measuring up to 3.7 cm in the upper pole of the right kidney. Bilateral adrenal glands are normal in appearance. No hydroureteronephrosis. Stomach/Bowel: Normal appearance of the stomach. No pathologic dilatation of small bowel or colon. Numerous colonic diverticulae are noted, without surrounding inflammatory changes to suggest an acute diverticulitis at this time. Normal appendix. Adjacent to the mid sigmoid colon there is again a pigtail drainage catheter which is immediately anterior to the superior aspect of the urinary bladder and immediately deep to the anterior abdominal wall musculature. This is within a completely collapsed cavity. Vascular/Lymphatic: Aortic atherosclerosis. Aneurysmal dilatation of the common iliac arteries bilaterally measuring 3.1 cm on the right and 2.3 cm on the left. No lymphadenopathy noted in the abdomen or  pelvis. Reproductive: Prostate gland and seminal vesicles are unremarkable in appearance. Other: No significant volume of ascites.  No pneumoperitoneum. Musculoskeletal: There are no aggressive appearing lytic or blastic lesions noted in the visualized portions of the skeleton. IMPRESSION: 1. There are no definite acute findings noted in the abdomen or pelvis to account for the patient's symptoms. 2. Dependent changes in the lung bases bilaterally concerning for sequela of mild aspiration. 3. Indwelling pigtail drainage catheter in the low anterior anatomic pelvis for chronic colocutaneous fistula redemonstrated. This is within a completely collapsed collection. No new abscess identified. 4. Colonic diverticulosis without evidence of acute diverticulitis at this time. 5. Aortic atherosclerosis, in addition to left main and 3 vessel coronary artery disease. 6. There are calcifications of the aortic valve. Echocardiographic correlation for evaluation of potential valvular dysfunction may be warranted if clinically indicated. Electronically Signed   By: Vinnie Langton M.D.   On: 03/18/2019 09:15   Dg Chest Port 1 View  Result Date: 03/19/2019 CLINICAL DATA:  Dyspnea, multiple episodes of loose stools, hypothyroidism, dementia, chronic kidney disease EXAM: PORTABLE CHEST 1 VIEW COMPARISON:  Portable exam 0020 hours compared to 03/17/2019 FINDINGS: Normal heart size mediastinal contours. BILATERAL lower lobe infiltrates  greater on RIGHT consistent with pneumonia. Upper lungs clear. No pleural effusion or pneumothorax. Osseous structures unremarkable. IMPRESSION: Bibasilar infiltrates consistent with pneumonia, greater on RIGHT. Electronically Signed   By: Lavonia Dana M.D.   On: 03/19/2019 00:35   Dg Chest Port 1 View  Result Date: 03/17/2019 CLINICAL DATA:  Diarrhea. EXAM: PORTABLE CHEST 1 VIEW COMPARISON:  No recent prior. FINDINGS: Stable cardiomegaly with normal pulmonary vascularity. Low lung volumes with  mild basilar atelectasis. No pleural effusion or pneumothorax. Interposition of the colon under the right hemidiaphragm. IMPRESSION: Stable cardiomegaly with normal pulmonary vascularity. Low lung volumes with mild basilar atelectasis. No focal infiltrate. Electronically Signed   By: Marcello Moores  Register   On: 03/17/2019 15:40   Medications: Infusions: . heparin 1,050 Units/hr (03/19/19 0344)  . meropenem (MERREM) IV    .  sodium bicarbonate (isotonic) infusion in sterile water 150 mL/hr at 03/19/19 0344    Scheduled Medications: . aspirin EC  81 mg Oral Daily  . clopidogrel  75 mg Oral Daily  . ipratropium  0.5 mg Nebulization Q6H  . levalbuterol  0.63 mg Nebulization Q6H  . levothyroxine  50 mcg Oral Daily  . sodium zirconium cyclosilicate  10 g Oral BID    have reviewed scheduled and prn medications.  Physical Exam: General: very difficult to arouse- tech did get him to squeeze hands- daughter at bedside- she is suspicious and really doesn't seem to get how sick he is  Heart: RRR Lungs: CBS bilat but still only [place] o2 Abdomen: soft, colocutaneous fistula Extremities: no edema    03/19/2019,8:05 AM  LOS: 2 days

## 2019-03-19 NOTE — Evaluation (Signed)
Clinical/Bedside Swallow Evaluation Patient Details  Name: Robert Barnett MRN: [id number] Date of Birth: May 17, 1932  Today's Date: 03/19/2019 Time: SLP Start Time (ACUTE ONLY): 12 SLP Stop Time (ACUTE ONLY): 1332 SLP Time Calculation (min) (ACUTE ONLY): 20 min  Past Medical History:  Past Medical History:  Diagnosis Date  . Arthritis   . BPH (benign prostatic hyperplasia)   . Carotid artery occlusion   . Dementia ([place])   . Dysphagia   . Hyperlipidemia   . Hypertension   . Peripheral vascular disease ([place])   . Stroke [facility])    Right hemispheric CVA  . Umbilical hernia    Past Surgical History:  Past Surgical History:  Procedure Laterality Date  . CAROTID ENDARTERECTOMY  10/06/2007   Right CEA by Dr. Amedeo Plenty  . IR CATHETER TUBE CHANGE  09/15/2017  . IR CATHETER TUBE CHANGE  12/03/2017  . IR CATHETER TUBE CHANGE  12/23/2017  . IR CATHETER TUBE CHANGE  05/07/2018  . IR CATHETER TUBE CHANGE  06/29/2018  . IR CATHETER TUBE CHANGE  08/18/2018  . IR CATHETER TUBE CHANGE  09/08/2018  . IR CATHETER TUBE CHANGE  10/15/2018  . IR CATHETER TUBE CHANGE  01/26/2019  . IR GENERIC HISTORICAL  07/09/2016   IR RADIOLOGIST EVAL & MGMT 07/09/2016 Arne Cleveland, MD [facility] INTERV RAD  . IR RADIOLOGIST EVAL & MGMT  09/15/2017  . IR RADIOLOGIST EVAL & MGMT  05/06/2018  . PEG TUBE PLACEMENT  2008   after CVA   HPI:  85 y.o. male with medical history significant for dementia, CKD4, AAA, BPH, hypothyrodism, who was bought to the Ed with c/o mutiple episodes of loose stool and generalized weakness over the past week.  Today patient was not responding communicating as he used to.  History is obtained from daughter who is at bedside.  Patient do awake and alert now with improvement in mental status, is not cooperating and is unable to give me a history.  Daughter denies abdominal pain, reports patient complained of pain in his rectum.   CXR 03/19/19 indicated Bibasilar infiltrates consistent with pneumonia,  greater on RIGHT.   Assessment / Plan / Recommendation Clinical Impression   Pt presents with oropharyngeal dysphagia characterized by oral holding, decreased bolus propulsion/manipulation and suspected delay in the initiation of the swallow; pt had immediate cough with 1/2 tsp of thin, but this improved with 1/2 tsp of nectar-thickened liquids without overt s/s of aspiration noted with decreased volume and mod-max verbal/tactile cues provided; educated family regarding aspiration precautions/exhibiting an increased level of alertness during intake and/or not eating/drinking if LOA decreased; family in agreement with POC; initiate Dysphagia 1 (puree)/nectar-thickened liquid diet with 1/2 tsp amounts only to reduce/eliminate aspiration risk IF PT ALERT; MBS may be indicated once pt can participate fully/cognition has improved overall for best results; thank you for this consult. SLP Visit Diagnosis: Dysphagia, oropharyngeal phase (R13.12)    Aspiration Risk  Mild aspiration risk;Moderate aspiration risk    Diet Recommendation   Dysphagia 1/nectar-thick via 1/2 tsp amounts only  Medication Administration: Via alternative means(and/or crushed with puree)    Other  Recommendations Oral Care Recommendations: Oral care BID Other Recommendations: Order thickener from pharmacy   Follow up Recommendations 24 hour supervision/assistance      Frequency and Duration min 2x/week  2 weeks       Prognosis Prognosis for Safe Diet Advancement: Fair Barriers to Reach Goals: Cognitive deficits      Swallow Study   General Date of  Onset: 03/17/19 HPI: 85 y.o. male with medical history significant for dementia, CKD4, AAA, BPH, hypothyrodism, who was bought to the Ed with c/o mutiple episodes of loose stool and generalized weakness over the past week.  Today patient was not responding communicating as he used to.  History is obtained from daughter who is at bedside.  Patient do awake and alert now with  improvement in mental status, is not cooperating and is unable to give me a history.  Daughter denies abdominal pain, reports patient complained of pain in his rectum.   Type of Study: Bedside Swallow Evaluation Previous Swallow Assessment: n/a Diet Prior to this Study: NPO Temperature Spikes Noted: No Respiratory Status: Room air History of Recent Intubation: No Behavior/Cognition: Cooperative;Distractible;Lethargic/Drowsy;Requires cueing Oral Cavity Assessment: Dry Oral Care Completed by SLP: Yes Oral Cavity - Dentition: Edentulous Vision: Impaired for self-feeding Self-Feeding Abilities: Needs assist Patient Positioning: Upright in bed Baseline Vocal Quality: Low vocal intensity Volitional Cough: Weak Volitional Swallow: Unable to elicit    Oral/Motor/Sensory Function Overall Oral Motor/Sensory Function: Generalized oral weakness   Ice Chips Ice chips: Impaired Presentation: Spoon Oral Phase Functional Implications: Oral holding Pharyngeal Phase Impairments: Suspected delayed Swallow   Thin Liquid Thin Liquid: Impaired Presentation: Spoon Oral Phase Functional Implications: Oral holding Pharyngeal  Phase Impairments: Suspected delayed Swallow;Cough - Immediate    Nectar Thick Nectar Thick Liquid: Impaired Presentation: Spoon Oral phase functional implications: Oral holding Pharyngeal Phase Impairments: Suspected delayed Swallow   Honey Thick Honey Thick Liquid: Not tested   Puree Puree: Impaired Presentation: Spoon Oral Phase Functional Implications: Oral holding Pharyngeal Phase Impairments: Suspected delayed Swallow   Solid     Solid: Not tested      Elvina Sidle, M.S., CCC-SLP 03/19/2019,1:56 PM

## 2019-03-19 NOTE — Progress Notes (Signed)
Mid-level MD was previously notified of pts condition and temp decreased to 101.5 within the hr. No new orders were given. However after re-assessing pt, noted to have and increase in respiratory rate of 28 and jerking movements. Sats 97% on room air. Notified Dr. Olevia Bowens to come up and see pt.

## 2019-03-19 NOTE — Progress Notes (Signed)
*  PRELIMINARY RESULTS* Echocardiogram 2D Echocardiogram LIMITED has been performed.  Robert Barnett 03/19/2019, 4:07 PM

## 2019-03-19 NOTE — Consult Note (Signed)
Consultation Note Date: 03/19/2019   Patient Name: Robert Barnett  DOB: 1932-10-29  MRN: [id number]  Age / Sex: 85 y.o., male  PCP: Janith Lima, MD Referring Physician: Kayleen Memos, DO  Reason for Consultation: Establishing goals of care  HPI/Patient Profile: 85 y.o. male admitted to [facility] on 03/17/2019 with generalized weakness and loose stools. During his admission family requested to transfer to [facility] hospital due to suspected MI and further work-up. During his ED course at [facility] it was reported patient awake and alert but unable to provide history or cooperate due to dementia. History was provided by daughters, who reported patient was experiencing loose stools and generalized weakness for approximately a week prior to admission. Family reported at baseline patient has dementia, recognizes family, and will answer simple questions. He was found to be tachypneid (28), hypotensive (88/59), lactic acid 2.4, UA positive for UTI, Creatinine 6.6, chest x-ray negative for abnormality. Since admission patient showed signs of decline and fever, despite antibiotics. EKG on 03/19/19 showed new T wave changes with prolonged QT and elevated troponin 2.75. Chest x-ray showed bilateral lower pneumonia. Family made decision for patient to be transferred to [facility] hospital for continued work-up and interventions. Palliative Medicine team consulted for goals of care.   Clinical Assessment and Goals of Care: I have reviewed medical records including lab results, imaging, Epic notes, and MAR, received report from the bedside RN, and assessed the patient. I met at the bedside with daughter, Luanna Salk [facility]) to discuss diagnosis prognosis, GOC, EOL wishes, disposition and options.Luanna Salk also contacted her sister Darlene via phone. Patient is at baseline with dementia and short answers per daughter. He is awake, alert, but  does not respond to most questions appropriately and will not follow commands.   I introduced Palliative Medicine as specialized medical care for people living with serious illness. It focuses on providing relief from the symptoms and stress of a serious illness. The goal is to improve quality of life for both the patient and the family.  We discussed a brief life review of the patient. Daughter reports patient is a retired. He lives with his son. He has 4 children and many grandchildren. He is of [place] faith. Darol Destine reports her mother passed away over 15 years ago and since then she and her siblings have cared for her father.   As far as functional and nutritional status patient suffered a CVA in 2008 which caused him to have a PEG and extensive rehab. Darol Destine reports he recovered however, mentally he continued to have difficulty with his memory later to be diagnosed with dementia. 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 Daughters report noticeable signs of decreased appetite over the past 2 months, where he generally had a big appetite and would often eat seconds. He was able to ambulate without assistance and use of walker at times. Darol Destine reports family cares for him 24/7 in regards to meal preparation and assistance with ADLs such as bathing.  We discussed his current illness and what it means in the larger context of his on-going co-morbidities. I spent a detailed amount of time discussing patient's current cardiac condition, renal function, dementia, risk of aspiration, and overall function. Natural disease trajectory and expectations at EOL were discussed. Daughter tearful and expressed understanding of his decline and signs of functional decline, risk of aspiration, and possibility of long-term complications and repetitive hospitalizations.   Daughters verbalized they are remaining hopeful despite updates and "what it seems like now". They continued to refer to his  condition when he had his stroke in 2008 and required extensive therapy and PEG tube. Shae expressed they care for him in the home and have continued to do so as his children with success of not requiring SNF placement. She expressed they are aware of some decline and have come to terms of possibility that he may require SNF at this point. She expresses they did not care much for PEG and having to provide feedings etc. However, they would be willing to go through this again if patient continued to show signs of aspiration and/or poor appetite. I attempted to explain the differences in PEG use in advanced dementia compared to an acute CVA and the long-term versus short-term use. Daughters verbalized understanding, but voiced continued support and hopes for their father.   Daughter and son verbalized concern that family visitation will soon be restricted and patient requires their presence for familiarity and behavior stability. Support given and they are aware medical team will discuss with appropriate personnel.   I attempted to elicit values and goals of care important to the patient.    The difference between aggressive medical intervention and comfort care was considered in light of the patient's goals of care.  Again at this time family voiced wishes to continue with full aggressive measures and all interventions as recommended.   Daughters reports patient has an advanced directive identify all children as medical decision makers and full aggressive care. I discussed in detailed patient's full code status with consideration to his current illness and co-morbidities. Daughters voiced wishes to continue with full aggressive measures and full code. They request all heroic measures including intubation if required.   Hospice and Palliative Care services outpatient were explained. Daughter verbalized they were not interested in hospice but would consider outpatient palliative for additional support once  discharged.   Questions and concerns were addressed.  Hard Choices booklet left for review. The family was encouraged to call with questions or concerns.  PMT will continue to support holistically.  Primary Decision Maker: NEXT OF KIN/Daughters: Luanna Salk and Darlena    SUMMARY OF RECOMMENDATIONS    Full Code  Full aggressive measures, continue to treat  Daughters remain hopeful patient will show signs of improvement. [place] with family if patient requires interventions including PEG despite detailed discussion and education on dementia progression and current illness trajectory.   Open to outpatient palliative and SNF for rehab   Family will require continuous open and honest discussion regarding patient's condition, status, and care needed.   Unsure of visitation exceptions, however patient will require a family member to continue to be present 24/7 for support and suppression of behavioral disturbance. Advised if exceptions are allowed this would only include immediate family which is one of his children with only 1 present at any given time, IF [place].   Palliative Medicine team will continue to support patient, family, and medical team during hospitalization.   Code Status/Advance  Care Planning:  Full code   Symptom Management:   Per attending   Palliative Prophylaxis:   Aspiration, Bowel Regimen, Eye Care, Frequent Pain Assessment, Oral Care and Turn Reposition  Additional Recommendations (Limitations, Scope, Preferences):  Full Scope Treatment  Psycho-social/Spiritual:   Desire for further Chaplaincy support:NO  Additional Recommendations: Caregiving  Support/Resources and Education on Hospice  Prognosis:   Guarded to Poor in the setting of advanced dementia, aspiration, poor po intake, deconditioning, acute on chronic kidney disease stage 4, NSTEMI, hypertension, behavior disturbance, CAD, elevated troponin, right brain CVA (2008), fever,  and diarrhea.   Discharge Planning: To Be Determined outpatient palliative services at minimum.       Primary Diagnoses: Present on Admission:  AKI (acute kidney injury) ([place])  Essential hypertension  Moderate dementia without behavioral disturbance (HCC)  CAD (coronary artery disease), native coronary artery  CKD (chronic kidney disease) stage 4, GFR 15-29 ml/min (HCC)  Colovesical fistula  Mental status alteration  Fever  Diarrhea  Elevated troponin   I have reviewed the medical record, interviewed the patient and family, and examined the patient. The following aspects are pertinent.  Past Medical History:  Diagnosis Date   Arthritis    BPH (benign prostatic hyperplasia)    Carotid artery occlusion    Dementia (HCC)    Dysphagia    Hyperlipidemia    Hypertension    Peripheral vascular disease (HCC)    Stroke (HCC)    Right hemispheric CVA   Umbilical hernia    Social History   Socioeconomic History   Marital status: Widowed    Spouse name: Not on file   Number of children: Not on file   Years of education: Not on file   Highest education level: Not on file  Occupational History   Occupation: Retired  Scientist, product/process development strain: Not on file   Food insecurity:    Worry: Not on file    Inability: Not on Lexicographer needs:    Medical: Not on file    Non-medical: Not on file  Tobacco Use   Smoking status: Former Smoker   Smokeless tobacco: Never Used   Tobacco comment: remote h/o  Substance and Sexual Activity   Alcohol use: No   Drug use: No   Sexual activity: Not on file  Lifestyle   Physical activity:    Days per week: Not on file    Minutes per session: Not on file   Stress: Not on file  Relationships   Social connections:    Talks on phone: Not on file    Gets together: Not on file    Attends religious service: Not on file    Active member of club or organization: Not on file     Attends meetings of clubs or organizations: Not on file    Relationship status: Not on file  Other Topics Concern   Not on file  Social History Narrative   Not on file   Family History  Problem Relation Age of Onset   Obesity Daughter    Scheduled Meds:  aspirin EC  81 mg Oral Daily   chlorhexidine  15 mL Mouth Rinse BID   clopidogrel  75 mg Oral Daily   heparin injection (subcutaneous)  5,000 Units Subcutaneous Q8H   ipratropium  0.5 mg Nebulization Q6H   levalbuterol  0.63 mg Nebulization Q6H   levothyroxine  50 mcg Oral Daily   mouth rinse  15 mL Mouth Rinse  [id number]   Continuous Infusions:  dextrose 5 % and 0.2 % NaCl     meropenem (MERREM) IV 1 g (03/19/19 1452)   PRN Meds:.acetaminophen **OR** acetaminophen, fentaNYL (SUBLIMAZE) injection, nitroGLYCERIN, ondansetron **OR** ondansetron (ZOFRAN) IV Medications Prior to Admission:  Prior to Admission medications   Medication Sig Start Date End Date Taking? Authorizing Provider  aspirin 81 MG tablet Take 1 tablet (81 mg total) by mouth daily. 06/14/18  Yes Janith Lima, MD  benazepril (LOTENSIN) 40 MG tablet TAKE 1 TABLET BY MOUTH EVERY DAY 09/18/18  Yes Janith Lima, MD  clopidogrel (PLAVIX) 75 MG tablet TAKE 1 TABLET BY MOUTH EVERY DAY 01/11/19  Yes Janith Lima, MD  Colchicine 0.6 MG CAPS TAKE 1 CAPSULE BY MOUTH TWICE A DAY 11/26/18  Yes Janith Lima, MD  donepezil (ARICEPT) 10 MG tablet Take 1 tablet by mouth at bedtime. 12/28/18  Yes [provider]  dronabinol (MARINOL) 2.5 MG capsule Take 1 capsule (2.5 mg total) by mouth 2 (two) times daily before lunch and supper. 08/10/18  Yes Janith Lima, MD  ferrous sulfate 325 (65 FE) MG EC tablet TAKE 1 TABLET BY MOUTH EVERY DAY WITH BREAKFAST 12/08/18  Yes Janith Lima, MD  levothyroxine (SYNTHROID, LEVOTHROID) 50 MCG tablet TAKE 1 TABLET (50 MCG TOTAL) BY MOUTH DAILY. 12/30/18  Yes Janith Lima, MD  simvastatin (ZOCOR) 20 MG tablet Take 1  tablet (20 mg total) by mouth daily. 10/01/18  Yes Janith Lima, MD  thiamine 100 MG tablet TAKE 1 TABLET BY MOUTH EVERY DAY 01/03/19  Yes Janith Lima, MD  triamcinolone cream (KENALOG) 0.5 % Apply 1 application topically 2 (two) times daily. 03/31/18  Yes Janith Lima, MD  folic acid (FOLVITE) 1 MG tablet TAKE 1 TABLET BY MOUTH EVERY DAY Patient not taking: Reported on 03/17/2019 12/08/18   Janith Lima, MD   No Known Allergies Review of Systems  Unable to perform ROS: Dementia    Physical Exam Vitals signs and nursing note reviewed.  Constitutional:      General: He is awake.     Appearance: He is underweight. He is ill-appearing.     Comments: Thin, chronically ill appearing   Cardiovascular:     Rate and Rhythm: Normal rate and regular rhythm.     Pulses: Normal pulses.     Heart sounds: Normal heart sounds.  Pulmonary:     Effort: Pulmonary effort is normal.     Breath sounds: Decreased breath sounds present.  Abdominal:     General: Bowel sounds are normal.     Palpations: Abdomen is soft.  Skin:    General: Skin is warm and dry.  Neurological:     Mental Status: He is alert. Mental status is at baseline. He is confused.     Comments: Hx of dementia, confused to self, date of birth, is alert to daughter, at baseline per family, verbal with short phrase responses and non-verbal at times  Psychiatric:        Behavior: Behavior is uncooperative.        Cognition and Memory: Cognition is impaired. Memory is impaired.        Judgment: Judgment is inappropriate.     Vital Signs: BP (!) 112/44 (BP Location: Right Arm)    Pulse 66    Temp (!) 97.5 F (36.4 C) (Oral)    Resp (!) 29    Ht '5\' 10"'$  (1.778 m)    Wt  90 kg    SpO2 92%    BMI 28.47 kg/m  Pain Scale: PAINAD   Pain Score: 0-No pain   SpO2: SpO2: 92 % O2 Device:SpO2: 92 % O2 Flow Rate: .O2 Flow Rate (L/min): 2 L/min  IO: Intake/output summary:   Intake/Output Summary (Last 24 hours) at 03/19/2019  1838 Last data filed at 03/19/2019 1700 Gross per 24 hour  Intake 3793.06 ml  Output 575 ml  Net 3218.06 ml    LBM: Last BM Date: 03/19/19 Baseline Weight: Weight: 87 kg Most recent weight: Weight: 90 kg     Palliative Assessment/Data: PPS 30%   Flowsheet Rows     Most Recent Value  Intake Tab  Referral Department  Hospitalist  Unit at Time of Referral  ICU  Palliative Care Primary Diagnosis  Sepsis/Infectious Disease  Date Notified  03/18/19  Palliative Care Type  New Palliative care  Reason for referral  Clarify Goals of Care  Date of Admission  03/17/19  Date first seen by Palliative Care  03/19/19  # of days Palliative referral response time  1 Day(s)  # of days IP prior to Palliative referral  1  Clinical Assessment  Palliative Performance Scale Score  20%  Psychosocial & Spiritual Assessment  Palliative Care Outcomes  Patient/Family meeting held?  Yes  Who was at the meeting?  daughter, Luanna Salk and Carlyon Shadow (over phone)  Palliative Care Outcomes  Clarified goals of care, Provided psychosocial or spiritual support  Palliative Care follow-up planned  Yes, Home [pending dispo]  Palliative Care Follow-up Reason  Clarify goals of care, Advanced care planning  Other Treatment Preference Instructions  family support      Time In: 1430 Time Out: 1600 Time Total: 90 min  Greater than 50%  of this time was spent counseling and coordinating care related to the above assessment and plan.  Signed by:  Alda Lea, AGPCNP-BC Palliative Medicine Team  Phone: [phone] Fax: [phone] Pager: [phone] Amion: Bjorn Pippin    Please contact Palliative Medicine Team phone at [phone] for questions and concerns.  For individual provider: See Shea Evans

## 2019-03-19 NOTE — Progress Notes (Signed)
Chest x-ray showing pnuemonia, critical troponin of 2.75. Dr. Olevia Bowens aware. New order for stat EKG and to transfer to [facility]. Daughter at bedside updated. Pt transferred down to ICU at approx. 0200.

## 2019-03-19 NOTE — Progress Notes (Signed)
ANTICOAGULATION CONSULT NOTE - Initial Consult  Pharmacy Consult for Heparin dosing Indication: chest pain/ACS  No Known Allergies  Patient Measurements: Height: 5\' 10"  (177.8 cm) Weight: 191 lb 12.8 oz (87 kg) IBW/kg (Calculated) : 73 Heparin Dosing Weight: 87 kg  Vital Signs: Temp: 97.1 F (36.2 C) (03/21 0400) Temp Source: Axillary (03/21 0400) BP: 84/49 (03/21 0324) Pulse Rate: 77 (03/21 0324)  Labs: Recent Labs    03/17/19 1625 03/18/19 0217 03/18/19 1648 03/19/19 0023 03/19/19 0208  HGB 11.8* 11.5*  --  10.7*  --   HCT 40.4 39.1  --  34.0*  --   PLT 191 164  --  142*  --   APTT  --   --   --   --  32  LABPROT  --   --   --   --  16.3*  INR  --   --   --   --  1.3*  CREATININE 6.62* 5.83* 5.14* 5.08* 4.85*  TROPONINI  --   --   --  2.75*  --     Estimated Creatinine Clearance: 11.7 mL/min (A) (by C-G formula based on SCr of 4.85 mg/dL (H)).   Medical History: Past Medical History:  Diagnosis Date  . Arthritis   . BPH (benign prostatic hyperplasia)   . Carotid artery occlusion   . Dementia ([place])   . Dysphagia   . Hyperlipidemia   . Hypertension   . Peripheral vascular disease ([place])   . Stroke [facility])    Right hemispheric CVA  . Umbilical hernia     Medications:  Scheduled:  . aspirin EC  81 mg Oral Daily  . clopidogrel  75 mg Oral Daily  . ipratropium  0.5 mg Nebulization Q6H  . levalbuterol  0.63 mg Nebulization Q6H  . levothyroxine  50 mcg Oral Daily  . sodium zirconium cyclosilicate  10 g Oral BID   Infusions:  . heparin 1,050 Units/hr (03/19/19 0344)  . meropenem (MERREM) IV    .  sodium bicarbonate (isotonic) infusion in sterile water 150 mL/hr at 03/19/19 0344   PRN: acetaminophen **OR** acetaminophen, fentaNYL (SUBLIMAZE) injection, ondansetron **OR** ondansetron (ZOFRAN) IV  Assessment: Pharmacy consulted to dose heparin in patient with  ACS/STEMI, who is admitted with concern for aspiration pneumonia.  Goal of Therapy:  Heparin  level 0.3-0.7 units/ml Monitor platelets by anticoagulation protocol: Yes   Plan:  Give 2000 units bolus x 1 Start heparin infusion at 1050 units/hr Check anti-Xa level in 8 hours and daily while on heparin Continue to monitor H&H and platelets  Wyline Mood 03/19/2019,4:08 AM

## 2019-03-19 NOTE — Progress Notes (Signed)
Notified by tech of 102.9 temp at 2130, administered tylenol suppository with 10 mins. At this time pt. noted to have coarse rattles in upper chest with weak cough. Notified respiratory at this time and deep po suctioning with effectiveness, sats at this time 94% on room air. Continued to monitor.

## 2019-03-19 NOTE — Progress Notes (Signed)
New orders in place per Dr. Naomie Dean stat EKG, stat troponin, chest done and to be transferred to step down. Administered .0.45 NS bolus and mag sulfate at this time.

## 2019-03-19 NOTE — Progress Notes (Signed)
Robert Barnett was seen and examined with his daughter at his bedside.  Per RN had watery bowel movement last night.  Patient is somnolent and minimally interactive.  Elevated troponin at 2.75.  Cycle troponin x3.  2D echo ordered.  Cardiology consulted.  Negative C. difficile PCR.  GI panel pending.  Please refer to progress note dictated by Dr. Olevia Bowens on 03/19/2019 for further details of the assessment and plan.

## 2019-03-19 NOTE — Progress Notes (Signed)
Patient ID: Robert Barnett, male   DOB: 1932-03-12, 85 y.o.   MRN: [id number]  Night shift telemetry coverage note.  The patient was seen due to respiratory distress and fever. His respiratory rate was 28 bpm.  O2 sat was 92% on room air on initial evaluation.  After nasal cannula placement his O2 sat went up to the high 90s.  Blood pressure was 109/58 mmHg and most recent temperature was 99.7 F.  However the patient had a fever of 102.9 F earlier in the evening.  After talking to his daughter, she believes that he may have aspirated earlier today.  He has a history of very functional baseline dementia, still walks without assistance and recognizes all family members.  H&P even lab results were reviewed.  General: Mildly febrile and in respiratory distress. HEENT: Oral mucosa is dry. Neck: Supple, no JVD. Respiratory: Tachypneic in the high 20s and low 30s bpm, decreased breath sounds on both bases right > left with bilateral rhonchi. Cardiovascular: S1-S2 with occasional extrasystole, RRR, no murmurs. Abdomen: Soft, mild diffuse tenderness, particularly on lower quadrants. Extremities: No edema. Neuro: Obtunded, but may answer some simple questions.  Work-up: Repeat CBC shows a white count of 6.2 with 69% neutrophils, 4% lymphocytes, 22% monocytes.  Hemoglobin 10.7 g/dL and platelets 142.  Lactic acid was normal at 1.6 mmol/L.  Sodium is 149, potassium 4.5, chloride 120 and CO2 18 mmol/L.  Calcium 7.5, glucose 74, BUN 115 and creatinine 5.08 mg/dL.  Total protein 6.1 and albumin 2.0 g/dL.  AST was 80, ALT, alk phos and total bilirubin are within normal limits.  EKG shows new T wave changes on anterolateral leads.  His QT/QTc was prolonged 384/482.  Troponin was 2.75 ng/mL. Chest radiograph is showing bilateral lower pneumonia with a bigger infiltrate on the right lower lobe.  Assessment:   NSTEMI Aspirin 300 mg PR x1 given. Heparin infusion was started. Magnesium sulfate 2 g  IVPB. Troponin level will be trended.  Deferred NTG or beta-blockers due to soft pressures. Obtain echocardiogram in a.m. Discussed with the patient's daughter who after discussion with her other siblings they have decided to transfer to tertiary care facility.  I spoke with cardiology on-call which will evaluate on consult [facility].  Aspiration pneumonia Switch antibiotic coverage from ceftriaxone plus metronidazole to meropenem for better anaerobic and overall broader spectrum coverage.  This should also treat any intra-abdominal or GU infection.  AKI Acidosis has resolved.  Hypernatremia Tinea hypotonic fluids. Follow-up potassium.  About 65 minutes of critical care time were spent during the process of these emergent events.  Tennis Must, MD  This document was prepared using Dragon voice recognition software and may contain some unintended transcription errors.

## 2019-03-20 LAB — CBC
HCT: 29.3 % — ABNORMAL LOW (ref 39.0–52.0)
Hemoglobin: 9 g/dL — ABNORMAL LOW (ref 13.0–17.0)
MCH: 31.1 pg (ref 26.0–34.0)
MCHC: 30.7 g/dL (ref 30.0–36.0)
MCV: 101.4 fL — ABNORMAL HIGH (ref 80.0–100.0)
Platelets: 111 10*3/uL — ABNORMAL LOW (ref 150–400)
RBC: 2.89 MIL/uL — AB (ref 4.22–5.81)
RDW: 17.3 % — ABNORMAL HIGH (ref 11.5–15.5)
WBC: 3.8 10*3/uL — ABNORMAL LOW (ref 4.0–10.5)
nRBC: 0 % (ref 0.0–0.2)

## 2019-03-20 LAB — BASIC METABOLIC PANEL
Anion gap: 14 (ref 5–15)
Anion gap: 11 (ref 5–15)
Anion gap: 8 (ref 5–15)
Anion gap: 5 (ref 5–15)
Anion gap: 6 (ref 5–15)
BUN: 92 mg/dL — ABNORMAL HIGH (ref 8–23)
BUN: 88 mg/dL — ABNORMAL HIGH (ref 8–23)
BUN: 88 mg/dL — ABNORMAL HIGH (ref 8–23)
BUN: 84 mg/dL — ABNORMAL HIGH (ref 8–23)
BUN: 81 mg/dL — ABNORMAL HIGH (ref 8–23)
CHLORIDE: 113 mmol/L — AB (ref 98–111)
CHLORIDE: 115 mmol/L — AB (ref 98–111)
CO2: 24 mmol/L (ref 22–32)
CO2: 26 mmol/L (ref 22–32)
CO2: 26 mmol/L (ref 22–32)
CO2: 29 mmol/L (ref 22–32)
CO2: 27 mmol/L (ref 22–32)
Calcium: 7.5 mg/dL — ABNORMAL LOW (ref 8.9–10.3)
Calcium: 7.6 mg/dL — ABNORMAL LOW (ref 8.9–10.3)
Calcium: 7.5 mg/dL — ABNORMAL LOW (ref 8.9–10.3)
Calcium: 7.5 mg/dL — ABNORMAL LOW (ref 8.9–10.3)
Calcium: 7.4 mg/dL — ABNORMAL LOW (ref 8.9–10.3)
Chloride: 114 mmol/L — ABNORMAL HIGH (ref 98–111)
Chloride: 115 mmol/L — ABNORMAL HIGH (ref 98–111)
Chloride: 116 mmol/L — ABNORMAL HIGH (ref 98–111)
Creatinine, Ser: 3.62 mg/dL — ABNORMAL HIGH (ref 0.61–1.24)
Creatinine, Ser: 3.5 mg/dL — ABNORMAL HIGH (ref 0.61–1.24)
Creatinine, Ser: 3.38 mg/dL — ABNORMAL HIGH (ref 0.61–1.24)
Creatinine, Ser: 3.24 mg/dL — ABNORMAL HIGH (ref 0.61–1.24)
Creatinine, Ser: 2.88 mg/dL — ABNORMAL HIGH (ref 0.61–1.24)
GFR calc Af Amer: 17 mL/min — ABNORMAL LOW (ref >60)
GFR calc Af Amer: 18 mL/min — ABNORMAL LOW (ref >60)
GFR calc Af Amer: 18 mL/min — ABNORMAL LOW (ref >60)
GFR calc Af Amer: 19 mL/min — ABNORMAL LOW (ref >60)
GFR calc Af Amer: 22 mL/min — ABNORMAL LOW (ref >60)
GFR calc non Af Amer: 15 mL/min — ABNORMAL LOW (ref >60)
GFR calc non Af Amer: 15 mL/min — ABNORMAL LOW (ref >60)
GFR calc non Af Amer: 16 mL/min — ABNORMAL LOW (ref >60)
GFR calc non Af Amer: 17 mL/min — ABNORMAL LOW (ref >60)
GFR calc non Af Amer: 19 mL/min — ABNORMAL LOW (ref >60)
Glucose, Bld: 82 mg/dL (ref 70–99)
Glucose, Bld: 89 mg/dL (ref 70–99)
Glucose, Bld: 100 mg/dL — ABNORMAL HIGH (ref 70–99)
Glucose, Bld: 104 mg/dL — ABNORMAL HIGH (ref 70–99)
Glucose, Bld: 120 mg/dL — ABNORMAL HIGH (ref 70–99)
Potassium: 3.5 mmol/L (ref 3.5–5.1)
Potassium: 3.5 mmol/L (ref 3.5–5.1)
Potassium: 3.6 mmol/L (ref 3.5–5.1)
Potassium: 3.4 mmol/L — ABNORMAL LOW (ref 3.5–5.1)
Potassium: 3.2 mmol/L — ABNORMAL LOW (ref 3.5–5.1)
SODIUM: 151 mmol/L — AB (ref 135–145)
Sodium: 151 mmol/L — ABNORMAL HIGH (ref 135–145)
Sodium: 149 mmol/L — ABNORMAL HIGH (ref 135–145)
Sodium: 150 mmol/L — ABNORMAL HIGH (ref 135–145)
Sodium: 148 mmol/L — ABNORMAL HIGH (ref 135–145)

## 2019-03-20 LAB — RENAL FUNCTION PANEL
ALBUMIN: 1.5 g/dL — AB (ref 3.5–5.0)
Anion gap: 13 (ref 5–15)
BUN: 96 mg/dL — ABNORMAL HIGH (ref 8–23)
CALCIUM: 7.4 mg/dL — AB (ref 8.9–10.3)
CO2: 24 mmol/L (ref 22–32)
Chloride: 113 mmol/L — ABNORMAL HIGH (ref 98–111)
Creatinine, Ser: 3.76 mg/dL — ABNORMAL HIGH (ref 0.61–1.24)
GFR calc Af Amer: 16 mL/min — ABNORMAL LOW (ref >60)
GFR calc non Af Amer: 14 mL/min — ABNORMAL LOW (ref >60)
Glucose, Bld: 74 mg/dL (ref 70–99)
Phosphorus: 4.4 mg/dL (ref 2.5–4.6)
Potassium: 3.7 mmol/L (ref 3.5–5.1)
SODIUM: 150 mmol/L — AB (ref 135–145)

## 2019-03-20 MED ORDER — DEXTROSE 5 % IV SOLN
INTRAVENOUS | Status: DC
  Administered 2019-03-20 – 2019-03-22 (×4): via INTRAVENOUS

## 2019-03-20 NOTE — Evaluation (Signed)
Occupational Therapy Evaluation Patient Details Name: Robert Barnett MRN: [id number] DOB: November 28, 1932 Today's Date: 03/20/2019    History of Present Illness Pt is an 85 year old man admitted with loose stools, generalized weakness and AMD. +NSTEMI and acute on chronic renal failure with dehydration. PMH: dementia, HTN, CKD IV, AAA, CVA, CAD. Palliative care is involved in pt's care.   Clinical Impression   Pt walked with supervision prior to admission, but was otherwise dependent in all ADL due to impaired cognition and low vision. Pt currently requires 2 person max assist for bed level mobility and was unable to stand this visit despite maximum encouragement. Recommending SNF vs home depending on family's ability to care for pt. No acute OT needs.    Follow Up Recommendations  SNF    Equipment Recommendations  Hospital bed(hoyer lift)    Recommendations for Other Services       Precautions / Restrictions Precautions Precautions: Fall      Mobility Bed Mobility Overal bed mobility: Needs Assistance Bed Mobility: Rolling;Supine to Sit;Sit to Supine Rolling: Max assist   Supine to sit: Max assist;+2 for physical assistance;+2 for safety/equipment Sit to supine: Max assist;+2 for physical assistance;+2 for safety/equipment   General bed mobility comments: Max to total assist  Transfers Overall transfer level: Needs assistance Equipment used: 2 person hand held assist Transfers: Sit to/from Stand Sit to Stand: +2 physical assistance;Total assist         General transfer comment: +2 total assist with multiple attempts, barely able to clear buttocks from surface. Max encouragement to participate    Balance Overall balance assessment: Needs assistance Sitting-balance support: Feet supported Sitting balance-Leahy Scale: Poor Sitting balance - Comments: posterior bias, requires hands on assist to maintain sitting balance                                    ADL either performed or assessed with clinical judgement   ADL                                         General ADL Comments: Pt requires total assist.     Vision Baseline Vision/History: Legally blind Patient Visual Report: No change from baseline       Perception     Praxis      Pertinent Vitals/Pain Pain Assessment: Faces Faces Pain Scale: Hurts even more Pain Location: groin region due to swelling and generalized pain and discomfort Pain Descriptors / Indicators: Grimacing Pain Intervention(s): Monitored during session     Hand Dominance Right   Extremity/Trunk Assessment Upper Extremity Assessment Upper Extremity Assessment: Generalized weakness   Lower Extremity Assessment Lower Extremity Assessment: Defer to PT evaluation       Communication Communication Communication: HOH   Cognition Arousal/Alertness: Lethargic Behavior During Therapy: Flat affect Overall Cognitive Status: Impaired/Different from baseline Area of Impairment: Orientation;Attention;Memory;Safety/judgement;Problem solving                 Orientation Level: Disoriented to;Time;Situation;Place Current Attention Level: Sustained;Focused Memory: Decreased short-term memory   Safety/Judgement: Decreased awareness of safety;Decreased awareness of deficits   Problem Solving: Slow processing;Decreased initiation;Difficulty sequencing;Requires verbal cues;Requires tactile cues     General Comments       Exercises     Shoulder Instructions      Home Living Family/patient expects  to be discharged to:: Private residence Living Arrangements: Children Available Help at Discharge: Family;Available 24 hours/day Type of Home: House Home Access: Level entry     Home Layout: One level     Bathroom Shower/Tub: Tub/shower unit;Door   [employer] Toilet: Standard     Home Equipment: Environmental consultant - 2 wheels;Bedside commode;Wheelchair - manual   Additional Comments:  Family able to provide 24 hour assist at discharge       Prior Functioning/Environment Level of Independence: Needs assistance  Gait / Transfers Assistance Needed: typically ambulates with supervision  ADL's / Homemaking Assistance Needed: daughters sponge bathe and dress pt from his recliner and do all IADL for pt   Comments: Patient lives with son, has 4 duagthers that help to dake care of them. assists with ADLs, but has been able to ambulate with supervision from family to go to church or out to breakfast, as of week he has not been able  to walk, falling 5x at home.         OT Problem List:        OT Treatment/Interventions:      OT Goals(Current goals can be found in the care plan section) Acute Rehab OT Goals Patient Stated Goal: to go home  OT Frequency:     Barriers to D/C:            Co-evaluation PT/OT/SLP Co-Evaluation/Treatment: Yes Reason for Co-Treatment: Necessary to address cognition/behavior during functional activity;For patient/therapist safety PT goals addressed during session: Mobility/safety with mobility;Balance OT goals addressed during session: ADL's and self-care      AM-PAC OT "6 Clicks" Daily Activity     Outcome Measure Help from another person eating meals?: Total Help from another person taking care of personal grooming?: Total Help from another person toileting, which includes using toliet, bedpan, or urinal?: Total Help from another person bathing (including washing, rinsing, drying)?: Total Help from another person to put on and taking off regular upper body clothing?: Total Help from another person to put on and taking off regular lower body clothing?: Total 6 Click Score: 6   End of Session Equipment Utilized During Treatment: Gait belt;Oxygen(3L)  Activity Tolerance: Patient limited by lethargy Patient left: in bed;with call bell/phone within reach;with family/visitor present  OT Visit Diagnosis: Pain;Muscle weakness (generalized)  (M62.81);Other symptoms and signs involving cognitive function                Time: 1200-1220 OT Time Calculation (min): 20 min Charges:  OT General Charges $OT Visit: 1 Visit OT Evaluation $OT Eval Moderate Complexity: 1 Mod  Nestor Lewandowsky, OTR/L Acute Rehabilitation Services Pager: [phone] Office: [phone]  Malka So 03/20/2019, 1:49 PM

## 2019-03-20 NOTE — Progress Notes (Addendum)
PROGRESS NOTE  Robert Barnett LOV:[id number] DOB: 1932/07/17 DOA: 03/17/2019 PCP: Janith Lima, MD  HPI/Recap of past 24 hours: Robert Barnett is a 85 y.o. male from home with medical history significant for dementia, CKD4, AAA, BPH, hypothyrodism, who was bought to the Ed with c/o mutiple episodes of loose stool and generalized weakness over the past week.  Today patient was not responding communicating as he used to.  History is obtained from daughter who is at bedside.  Patient do awake and alert now with improvement in mental status, is not cooperating and is unable to give me a history.  Daughter denies abdominal pain, reports patient complained of pain in his rectum.   No difficulty breathing normal, no cough, no fever or chills, no sick contacts, no recent travels.  At baseline patient has dementia, most times he recognizes close family members, answers simple questions, but needs assistance with most ADLs.  He is able to ambulate without assistance. On EMS arrival blood pressure 60/40.  03/19/19: Robert Barnett was seen and examined with his daughter at his bedside.  Per RN had watery bowel movement last night.  Patient is somnolent and minimally interactive.Elevated troponin at 2.75, trended down.  Unremarkable 2D echo per cardiology.  GI panel negative for C. difficile PCR and positive for entero-pathogenic E. Coli.  03/20/19: Patient seen and examined with his daughter at bedside.  No acute events overnight.  Blood pressure, vital signs are improving.  He is more alert today.  He has no new complaints.   Assessment/Plan: Active Problems:   Moderate dementia without behavioral disturbance (HCC)   Essential hypertension   Colovesical fistula   CKD (chronic kidney disease) stage 4, GFR 15-29 ml/min (HCC)   History of CVA (cerebrovascular accident)   AKI (acute kidney injury) ([facility])   CAD (coronary artery disease), native coronary artery   Mental status alteration   Fever   Diarrhea   Elevated troponin  Acute metabolic encephalopathy in the setting of dementia suspect secondary to severe dehydration versus aspiration pneumonia versus others Improving He is more alert today and interactive Continue IV fluid hydration D5 W only per nephrology due to hypernatremia  Aspiration pneumonia Presented with reported fever 102.9, hypoxia, dysphagia, chest x-ray suggestive of right middle lobe pneumonia with suspicion for aspiration  AKI on CKD 4 suspect prerenal secondary to dehydration Presented with creatinine of 4.12 Creatinine continues to improve 3.50 on 03/20/2019 Continue to avoid nephrotoxic agents/dehydration/hypotension Repeat BMP  Dysphagia Speech therapist evaluated recommended dysphagia 1 with nectar thick via half teaspoon amount only Aspiration precautions Feeding assistance  Elevated troponin suspect secondary to demand ischemia in the setting of acute illness Troponins peaked at 2.75 and trended down 2D echo done and unremarkable per cardiology Denies chest pain  Diarrhea secondary to enteropathogenic E. coli C. difficile negative GI panel positive for enteropathogenic E. coli Continue to closely monitor for any sign of dehydration  Physical debility/ambulatory dysfunction Patient is from home PT to assess Fall precautions  Severe protein calorie malnutrition Albumin 1.5 with BMI of 28 Oral supplement Encourage increase oral protein calorie intake  Hypotension Suspect secondary to volume loss severe dehydration with persistent diarrhea Blood pressure is improved and maintaining map greater than 65 Continue to closely monitor vital signs  Hypovolemic hypernatremia suspect secondary to severe dehydration From poor oral intake Sodium 151 D5W at 100 cc/h Continue BMP every 4 hours     Code Status: Full code  Family Communication: Daughter at bedside.  All questions answered to her satisfaction.  Disposition Plan: Home  versus SNF when hemodynamically stable.   Consultants:  Nephrology  Cardiology signed off on 03/20/2019  Procedures:  2D echo  Antimicrobials:  Meropenem  DVT prophylaxis: Subcu heparin 3 times daily   Objective: Vitals:   03/19/19 2038 03/19/19 2044 03/19/19 2342 03/20/19 0427  BP: 108/68  113/81 (!) 124/56  Pulse: 70  75 71  Resp: (!) 36  (!) 27 11  Temp: (!) 97.4 F (36.3 C)  (!) 97 F (36.1 C) 97.8 F (36.6 C)  TempSrc: Axillary  Axillary Oral  SpO2: 95% 94% 96% 98%  Weight:      Height:        Intake/Output Summary (Last 24 hours) at 03/20/2019 0935 Last data filed at 03/20/2019 0430 Gross per 24 hour  Intake 0 ml  Output 1425 ml  Net -1425 ml   Filed Weights   03/17/19 2137 03/19/19 0100 03/19/19 0556  Weight: 87 kg 87 kg 90 kg    Exam:  . General: 85 y.o. year-old male well developed well nourished in no acute distress.  Alert and interactive in the setting of dementia. . Cardiovascular: Regular rate and rhythm with no rubs or gallops.  No thyromegaly or JVD noted.   Marland Kitchen Respiratory: Clear to auscultation with no wheezes or rales. Good inspiratory effort. . Abdomen: Soft nontender nondistended with normal bowel sounds x4 quadrants. . Musculoskeletal: No lower extremity edema. 2/4 pulses in all 4 extremities. Marland Kitchen Psychiatry: Mood is appropriate for condition and setting   Data Reviewed: CBC: Recent Labs  Lab 03/17/19 1625 03/18/19 0217 03/19/19 0023 03/20/19 0558  WBC 7.6 5.7 6.2 3.8*  NEUTROABS 5.1  --  4.3  --   HGB 11.8* 11.5* 10.7* 9.0*  HCT 40.4 39.1 34.0* 29.3*  MCV 109.8* 109.8* 103.0* 101.4*  PLT 191 164 142* 466*   Basic Metabolic Panel: Recent Labs  Lab 03/19/19 0208 03/19/19 1440 03/19/19 2124 03/20/19 0207 03/20/19 0558 03/20/19 0907  NA 149* 153* 149* 150* 151* 151*  K 4.3 4.7 3.6 3.7 3.5 3.5  CL 119* 113* 114* 113* 113* 114*  CO2 19* 20* 23 24 24 26   GLUCOSE 67* 72 73 74 82 89  BUN 112* 102* 97* 96* 92* 88*   CREATININE 4.85* 4.12* 3.88* 3.76* 3.62* 3.50*  CALCIUM 7.1* 7.8* 7.4* 7.4* 7.5* 7.6*  PHOS 5.2* 5.0*  --  4.4  --   --    GFR: Estimated Creatinine Clearance: 17.7 mL/min (A) (by C-G formula based on SCr of 3.5 mg/dL (H)). Liver Function Tests: Recent Labs  Lab 03/17/19 1625 03/19/19 0023 03/19/19 0208 03/19/19 1440 03/20/19 0207  AST 61* 80*  --   --   --   ALT 36 32  --   --   --   ALKPHOS 68 64  --   --   --   BILITOT 0.5 0.7  --   --   --   PROT 7.1 6.1*  --   --   --   ALBUMIN 2.6* 2.0* 1.9* 1.5* 1.5*   Recent Labs  Lab 03/17/19 1625  LIPASE 72*   No results for input(s): AMMONIA in the last 168 hours. Coagulation Profile: Recent Labs  Lab 03/19/19 0208  INR 1.3*   Cardiac Enzymes: Recent Labs  Lab 03/19/19 0023 03/19/19 0906 03/19/19 1459 03/19/19 2124  TROPONINI 2.75* QUANTITY NOT SUFFICIENT, UNABLE TO PERFORM TEST 0.86* 0.67*   BNP (last 3 results) No results  for input(s): PROBNP in the last 8760 hours. HbA1C: No results for input(s): HGBA1C in the last 72 hours. CBG: No results for input(s): GLUCAP in the last 168 hours. Lipid Profile: No results for input(s): CHOL, HDL, LDLCALC, TRIG, CHOLHDL, LDLDIRECT in the last 72 hours. Thyroid Function Tests: No results for input(s): TSH, T4TOTAL, FREET4, T3FREE, THYROIDAB in the last 72 hours. Anemia Panel: No results for input(s): VITAMINB12, FOLATE, FERRITIN, TIBC, IRON, RETICCTPCT in the last 72 hours. Urine analysis:    Component Value Date/Time   COLORURINE YELLOW 03/18/2019 1500   APPEARANCEUR CLOUDY (A) 03/18/2019 1500   LABSPEC 1.012 03/18/2019 1500   PHURINE 5.0 03/18/2019 1500   GLUCOSEU NEGATIVE 03/18/2019 1500   GLUCOSEU NEGATIVE 12/30/2017 1443   HGBUR LARGE (A) 03/18/2019 1500   BILIRUBINUR NEGATIVE 03/18/2019 1500   KETONESUR NEGATIVE 03/18/2019 1500   PROTEINUR NEGATIVE 03/18/2019 1500   UROBILINOGEN 0.2 12/30/2017 1443   NITRITE NEGATIVE 03/18/2019 1500   LEUKOCYTESUR LARGE (A)  03/18/2019 1500   Sepsis Labs: @LABRCNTIP (procalcitonin:4,lacticidven:4)  ) Recent Results (from the past 240 hour(s))  Blood Culture (routine x 2)     Status: None (Preliminary result)   Collection Time: 03/17/19  4:25 PM  Result Value Ref Range Status   Specimen Description BLOOD RIGHT HAND  Final   Special Requests AEROBIC BOTTLE ONLY Blood Culture adequate volume  Final   Culture   Final    NO GROWTH 3 DAYS Performed at [facility], [address] [place], [place] [address]    Report Status PENDING  Incomplete  Blood Culture (routine x 2)     Status: None (Preliminary result)   Collection Time: 03/17/19  9:12 PM  Result Value Ref Range Status   Specimen Description BLOOD RIGHT ANTECUBITAL  Final   Special Requests   Final    BOTTLES DRAWN AEROBIC AND ANAEROBIC Blood Culture adequate volume   Culture   Final    NO GROWTH 3 DAYS Performed at [facility], [address], [place], [place] [address]    Report Status PENDING  Incomplete  Gastrointestinal Panel by PCR , Stool     Status: Abnormal   Collection Time: 03/18/19  9:00 AM  Result Value Ref Range Status   Campylobacter species NOT DETECTED NOT DETECTED Final   Plesimonas shigelloides NOT DETECTED NOT DETECTED Final   Salmonella species NOT DETECTED NOT DETECTED Final   Yersinia enterocolitica NOT DETECTED NOT DETECTED Final   Vibrio species NOT DETECTED NOT DETECTED Final   Vibrio cholerae NOT DETECTED NOT DETECTED Final   Enteroaggregative E coli (EAEC) NOT DETECTED NOT DETECTED Final   Enteropathogenic E coli (EPEC) DETECTED (A) NOT DETECTED Final    Comment: RESULT CALLED TO, READ BACK BY AND VERIFIED WITH:  AMINA MOUHAMED AT 1609 03/19/19 SDR    Enterotoxigenic E coli (ETEC) NOT DETECTED NOT DETECTED Final   Shiga like toxin producing E coli (STEC) NOT DETECTED NOT DETECTED Final   Shigella/Enteroinvasive E coli (EIEC) NOT DETECTED NOT DETECTED Final   Cryptosporidium NOT DETECTED NOT DETECTED Final    Cyclospora cayetanensis NOT DETECTED NOT DETECTED Final   Entamoeba histolytica NOT DETECTED NOT DETECTED Final   Giardia lamblia NOT DETECTED NOT DETECTED Final   Adenovirus F40/41 NOT DETECTED NOT DETECTED Final   Astrovirus NOT DETECTED NOT DETECTED Final   Norovirus GI/GII NOT DETECTED NOT DETECTED Final   Rotavirus A NOT DETECTED NOT DETECTED Final   Sapovirus (I, II, IV, and V) NOT DETECTED NOT DETECTED Final  Comment: Performed at [facility], [place], [place] [address]  C Difficile Quick Screen w PCR reflex     Status: None   Collection Time: 03/18/19  9:00 AM  Result Value Ref Range Status   C Diff antigen NEGATIVE NEGATIVE Final   C Diff toxin NEGATIVE NEGATIVE Final   C Diff interpretation No C. difficile detected.  Final    Comment: Performed at [facility], [address], [place], [place] [address]      Studies: No results found.  Scheduled Meds: . aspirin EC  81 mg Oral Daily  . chlorhexidine  15 mL Mouth Rinse BID  . clopidogrel  75 mg Oral Daily  . heparin injection (subcutaneous)  5,000 Units Subcutaneous Q8H  . ipratropium  0.5 mg Nebulization Q6H  . levalbuterol  0.63 mg Nebulization Q6H  . levothyroxine  50 mcg Oral Daily  . mouth rinse  15 mL Mouth Rinse q12n4p    Continuous Infusions: . dextrose    . meropenem (MERREM) IV 1 g (03/20/19 0212)     LOS: 3 days     Kayleen Memos, MD [facility] Pager [phone]  If 7PM-7AM, please contact night-coverage [email or web address] Password [facility] 03/20/2019, 9:35 AM

## 2019-03-20 NOTE — Progress Notes (Signed)
Echo reviewed with Dr Sallyanne Kuster. No further recommendations, cardiology will sign off.  Kerin Ransom PA-C 03/20/2019 9:03 AM

## 2019-03-20 NOTE — Progress Notes (Signed)
Subjective:  Transferred to [facility] per family request.  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 He was brought to [facility] on 3/19 with diarrhea and worsening MS- found to have NSTEMI and A on CRF (baseline crt 2) as well as may have aspirated while at other hospital.  Abdominal CT did not show hydro.  Urine looks like UTI.    UOP picking up again- 1425 last 24 hours- crt coming down slowly - BP better- higher.  Had meeting with palliative care- for continued full aggressive care and full code    Objective Vital signs in last 24 hours: Vitals:   03/19/19 2038 03/19/19 2044 03/19/19 2342 03/20/19 0427  BP: 108/68  113/81 (!) 124/56  Pulse: 70  75 71  Resp: (!) 36  (!) 27 11  Temp: (!) 97.4 F (36.3 C)  (!) 97 F (36.1 C) 97.8 F (36.6 C)  TempSrc: Axillary  Axillary Oral  SpO2: 95% 94% 96% 98%  Weight:      Height:       Weight change:   Intake/Output Summary (Last 24 hours) at 03/20/2019 0657 Last data filed at 03/20/2019 0430 Gross per 24 hour  Intake 0 ml  Output 1625 ml  Net -1625 ml    Assessment/Plan: 85 year old BM with dementia, HTN.  Now has suffered an NSTEMI, likely aspiration PNA, UTI and A on CRF 1.  Acute kidney injury on chronic kidney disease stage IIIb/IV: I suspect that this is primarily hemodynamically mediated in the setting of recent diarrhea/volume depletion with ongoing benazepril use.   urinalysis looks like UTI- culture pending and on meropenam. Imaging does not show hydro.  Continue to avoid hypotension/iodinated intravenous contrast and nonsteroidal anti-inflammatory drugs.  He does not have any acute indications for dialysis, improving slowly for now, hope this will continue to be the case. 2.  Hyperkalemia: resolved 3.  Mixed anion gap with non-gap metabolic acidosis: Secondary to losses from diarrhea as well as acute kidney injury.  better 4.  Fever/hypotension: Ongoing evaluation for focus of  infection-UTI/PNA.  He is on meropenam- blood cultures negative. Is not overloaded- agree with IVF.  ACE being held  5.  Hypernatremia: Indicative of poor water intake/fluid intake possibly associated with his altered mental status-has been bolused with 1/2 NS 3/21- worse today, will just change IVF to d5 6. Anemia- not an issue     Louis Meckel    Labs: Basic Metabolic Panel: Recent Labs  Lab 03/19/19 0208 03/19/19 1440 03/19/19 2124 03/20/19 0207 03/20/19 0558  NA 149* 153* 149* 150* 151*  K 4.3 4.7 3.6 3.7 3.5  CL 119* 113* 114* 113* 113*  CO2 19* 20* 23 24 24   GLUCOSE 67* 72 73 74 82  BUN 112* 102* 97* 96* 92*  CREATININE 4.85* 4.12* 3.88* 3.76* 3.62*  CALCIUM 7.1* 7.8* 7.4* 7.4* 7.5*  PHOS 5.2* 5.0*  --  4.4  --    Liver Function Tests: Recent Labs  Lab 03/17/19 1625 03/19/19 0023 03/19/19 0208 03/19/19 1440 03/20/19 0207  AST 61* 80*  --   --   --   ALT 36 32  --   --   --   ALKPHOS 68 64  --   --   --   BILITOT 0.5 0.7  --   --   --   PROT 7.1 6.1*  --   --   --   ALBUMIN 2.6* 2.0*  1.9* 1.5* 1.5*   Recent Labs  Lab 03/17/19 1625  LIPASE 72*   No results for input(s): AMMONIA in the last 168 hours. CBC: Recent Labs  Lab 03/17/19 1625 03/18/19 0217 03/19/19 0023  WBC 7.6 5.7 6.2  NEUTROABS 5.1  --  4.3  HGB 11.8* 11.5* 10.7*  HCT 40.4 39.1 34.0*  MCV 109.8* 109.8* 103.0*  PLT 191 164 142*   Cardiac Enzymes: Recent Labs  Lab 03/19/19 0023 03/19/19 0906 03/19/19 1459 03/19/19 2124  TROPONINI 2.75* QUANTITY NOT SUFFICIENT, UNABLE TO PERFORM TEST 0.86* 0.67*   CBG: No results for input(s): GLUCAP in the last 168 hours.  Iron Studies: No results for input(s): IRON, TIBC, TRANSFERRIN, FERRITIN in the last 72 hours. Studies/Results: Ct Abdomen Pelvis Wo Contrast  Result Date: 03/18/2019 CLINICAL DATA:  85 year old male with history of dementia. Generalized weakness over the past week. EXAM: CT ABDOMEN AND PELVIS WITHOUT CONTRAST  TECHNIQUE: Multidetector CT imaging of the abdomen and pelvis was performed following the standard protocol without IV contrast. COMPARISON:  CT the abdomen and pelvis 05/27/2018. FINDINGS: Lower chest: Heart size is normal. There is no significant pericardial fluid, thickening or pericardial calcification. There is aortic atherosclerosis, as well as atherosclerosis of the great vessels of the mediastinum and the coronary arteries, including calcified atherosclerotic plaque in the left main, left anterior descending, left circumflex and right coronary arteries. Calcifications of the aortic valve. Bibasilar areas of volume loss and architectural distortion with some peribronchovascular ground-glass attenuation, which may reflect sequela of mild aspiration. Hepatobiliary: No definite suspicious cystic or solid hepatic lesions are confidently identified on today's noncontrast CT examination. Multiple small calcified gallstones lying dependently in the gallbladder. No surrounding inflammatory changes to suggest an acute cholecystitis at this time. Pancreas: No definite pancreatic mass or peripancreatic fluid or inflammatory changes noted on today's noncontrast CT examination. Spleen: Unremarkable. Adrenals/Urinary Tract: Low-attenuation lesions in both kidneys, incompletely characterized on today's noncontrast CT examination, but statistically likely to represent cysts, measuring up to 3.7 cm in the upper pole of the right kidney. Bilateral adrenal glands are normal in appearance. No hydroureteronephrosis. Stomach/Bowel: Normal appearance of the stomach. No pathologic dilatation of small bowel or colon. Numerous colonic diverticulae are noted, without surrounding inflammatory changes to suggest an acute diverticulitis at this time. Normal appendix. Adjacent to the mid sigmoid colon there is again a pigtail drainage catheter which is immediately anterior to the superior aspect of the urinary bladder and immediately deep  to the anterior abdominal wall musculature. This is within a completely collapsed cavity. Vascular/Lymphatic: Aortic atherosclerosis. Aneurysmal dilatation of the common iliac arteries bilaterally measuring 3.1 cm on the right and 2.3 cm on the left. No lymphadenopathy noted in the abdomen or pelvis. Reproductive: Prostate gland and seminal vesicles are unremarkable in appearance. Other: No significant volume of ascites.  No pneumoperitoneum. Musculoskeletal: There are no aggressive appearing lytic or blastic lesions noted in the visualized portions of the skeleton. IMPRESSION: 1. There are no definite acute findings noted in the abdomen or pelvis to account for the patient's symptoms. 2. Dependent changes in the lung bases bilaterally concerning for sequela of mild aspiration. 3. Indwelling pigtail drainage catheter in the low anterior anatomic pelvis for chronic colocutaneous fistula redemonstrated. This is within a completely collapsed collection. No new abscess identified. 4. Colonic diverticulosis without evidence of acute diverticulitis at this time. 5. Aortic atherosclerosis, in addition to left main and 3 vessel coronary artery disease. 6. There are calcifications of the aortic valve. Echocardiographic  correlation for evaluation of potential valvular dysfunction may be warranted if clinically indicated. Electronically Signed   By: Vinnie Langton M.D.   On: 03/18/2019 09:15   Dg Chest Port 1 View  Result Date: 03/19/2019 CLINICAL DATA:  Dyspnea, multiple episodes of loose stools, hypothyroidism, dementia, chronic kidney disease EXAM: PORTABLE CHEST 1 VIEW COMPARISON:  Portable exam 0020 hours compared to 03/17/2019 FINDINGS: Normal heart size mediastinal contours. BILATERAL lower lobe infiltrates greater on RIGHT consistent with pneumonia. Upper lungs clear. No pleural effusion or pneumothorax. Osseous structures unremarkable. IMPRESSION: Bibasilar infiltrates consistent with pneumonia, greater on  RIGHT. Electronically Signed   By: Lavonia Dana M.D.   On: 03/19/2019 00:35   Medications: Infusions: . dextrose 5 % and 0.2 % NaCl 75 mL/hr at 03/20/19 0215  . meropenem (MERREM) IV 1 g (03/20/19 6394)    Scheduled Medications: . aspirin EC  81 mg Oral Daily  . chlorhexidine  15 mL Mouth Rinse BID  . clopidogrel  75 mg Oral Daily  . heparin injection (subcutaneous)  5,000 Units Subcutaneous Q8H  . ipratropium  0.5 mg Nebulization Q6H  . levalbuterol  0.63 mg Nebulization Q6H  . levothyroxine  50 mcg Oral Daily  . mouth rinse  15 mL Mouth Rinse q12n4p    have reviewed scheduled and prn medications.  Physical Exam: General: more alert today - daughter at bedside- she is suspicious and really doesn't seem to get how sick he is  Heart: RRR Lungs: CBS bilat but still only [place] o2 Abdomen: soft, colocutaneous fistula Extremities: no edema    03/20/2019,6:57 AM  LOS: 3 days

## 2019-03-20 NOTE — Evaluation (Signed)
Physical Therapy Evaluation Patient Details Name: Robert Barnett MRN: [id number] DOB: 21-Oct-1932 Today's Date: 03/20/2019   History of Present Illness  Pt is an 85 year old man admitted with loose stools, generalized weakness and AMD. +NSTEMI and acute on chronic renal failure with dehydration. PMH: dementia, HTN, CKD IV, AAA, CVA, CAD. Palliative care is involved in pt's care.  Clinical Impression  Orders received for PT evaluation. Patient demonstrates deficits in functional mobility as indicated below. Will benefit from continued skilled PT to address deficits and maximize function. Will see as indicated and progress as tolerated.  At this time, patient requires max to total physical assist of 2 persons. Anticipate recovery of function will be difficulty given nature of deficits. May benefit from consideration of increased resources at home to decrease caregiver burden.     Follow Up Recommendations SNF;Supervision/Assistance - 24 hour(vs home with palliative care)    Equipment Recommendations  Hospital bed(hoyer lift)    Recommendations for Other Services       Precautions / Restrictions Precautions Precautions: Fall      Mobility  Bed Mobility Overal bed mobility: Needs Assistance Bed Mobility: Rolling;Supine to Sit;Sit to Supine Rolling: Max assist   Supine to sit: Max assist;+2 for physical assistance;+2 for safety/equipment Sit to supine: Max assist;+2 for physical assistance;+2 for safety/equipment   General bed mobility comments: Max to total assist  Transfers Overall transfer level: Needs assistance Equipment used: 2 person hand held assist Transfers: Sit to/from Stand Sit to Stand: +2 physical assistance;Total assist         General transfer comment: +2 total assist with multiple attempts, barely able to clear buttocks from surface. Max encouragement to perform.  Ambulation/Gait             General Gait Details: unable  Stairs             Wheelchair Mobility    Modified Rankin (Stroke Patients Only)       Balance Overall balance assessment: Needs assistance Sitting-balance support: Feet supported Sitting balance-Leahy Scale: Poor Sitting balance - Comments: posterior bias, requires hands on assist to maintain sitting balance                                     Pertinent Vitals/Pain Pain Assessment: Faces Faces Pain Scale: Hurts even more Pain Location: groin region due to swelling and generalized pain and discomfort Pain Descriptors / Indicators: Grimacing Pain Intervention(s): Limited activity within patient's tolerance    Home Living Family/patient expects to be discharged to:: Private residence Living Arrangements: Children Available Help at Discharge: Family;Available 24 hours/day Type of Home: House Home Access: Level entry     Home Layout: One level Home Equipment: Walker - 2 wheels;Bedside commode;Wheelchair - manual Additional Comments: Family able to provide 24 hour assist at discharge     Prior Function Level of Independence: Needs assistance   Gait / Transfers Assistance Needed: typically ambulates with supervision   ADL's / Homemaking Assistance Needed: famile gives sponge baths  Comments: Patient lives with son, has 4 duagthers that help to take care of them. assists with ADLs, but has been able to ambulate with supervision from family to go to church or out to breakfast, as of week he has not been able  to walk, falling 5x at home.      Hand Dominance   Dominant Hand: Right    Extremity/Trunk Assessment  Upper Extremity Assessment Upper Extremity Assessment: Generalized weakness    Lower Extremity Assessment Lower Extremity Assessment: Generalized weakness       Communication   Communication: HOH  Cognition Arousal/Alertness: Lethargic Behavior During Therapy: Flat affect Overall Cognitive Status: Impaired/Different from baseline Area of Impairment:  Orientation;Attention;Memory;Safety/judgement;Problem solving                 Orientation Level: Disoriented to;Time;Situation;Place(per daughter usually not oriented to time) Current Attention Level: Sustained;Focused Memory: Decreased short-term memory   Safety/Judgement: Decreased awareness of safety;Decreased awareness of deficits   Problem Solving: Slow processing;Decreased initiation;Difficulty sequencing;Requires verbal cues;Requires tactile cues        General Comments      Exercises     Assessment/Plan    PT Assessment Patient needs continued PT services  PT Problem List Decreased strength;Decreased activity tolerance;Decreased balance;Decreased mobility;Decreased coordination;Decreased safety awareness;Pain       PT Treatment Interventions DME instruction;Gait training;Therapeutic activities;Functional mobility training;Balance training;Therapeutic exercise;Neuromuscular re-education;Cognitive remediation;Patient/family education    PT Goals (Current goals can be found in the Care Plan section)  Acute Rehab PT Goals Patient Stated Goal: to go home PT Goal Formulation: With patient/family Time For Goal Achievement: 04/03/19 Potential to Achieve Goals: Poor    Frequency Min 2X/week   Barriers to discharge        Co-evaluation PT/OT/SLP Co-Evaluation/Treatment: Yes Reason for Co-Treatment: Complexity of the patient's impairments (multi-system involvement);Necessary to address cognition/behavior during functional activity;For patient/therapist safety;To address functional/ADL transfers PT goals addressed during session: Mobility/safety with mobility;Balance OT goals addressed during session: ADL's and self-care;Strengthening/ROM       AM-PAC PT "6 Clicks" Mobility  Outcome Measure Help needed turning from your back to your side while in a flat bed without using bedrails?: A Lot Help needed moving from lying on your back to sitting on the side of a flat  bed without using bedrails?: Total Help needed moving to and from a bed to a chair (including a wheelchair)?: Total Help needed standing up from a chair using your arms (e.g., wheelchair or bedside chair)?: Total Help needed to walk in hospital room?: Total Help needed climbing 3-5 steps with a railing? : Total 6 Click Score: 7    End of Session Equipment Utilized During Treatment: Oxygen Activity Tolerance: Patient limited by fatigue;Patient limited by lethargy Patient left: in bed;with call bell/phone within reach;with family/visitor present Nurse Communication: Mobility status PT Visit Diagnosis: Muscle weakness (generalized) (M62.81);Adult, failure to thrive (R62.7)    Time: 1200-1220 PT Time Calculation (min) (ACUTE ONLY): 20 min   Charges:   PT Evaluation $PT Eval Moderate Complexity: 1 Mod          Alben Deeds, PT DPT  Board Certified Neurologic Specialist Acute Rehabilitation Services Pager [phone] Office [place] 03/20/2019, 1:21 PM

## 2019-03-21 ENCOUNTER — Other Ambulatory Visit: Payer: Self-pay | Admitting: Internal Medicine

## 2019-03-21 DIAGNOSIS — Z7189 Other specified counseling: Secondary | ICD-10-CM

## 2019-03-21 DIAGNOSIS — Z515 Encounter for palliative care: Secondary | ICD-10-CM

## 2019-03-21 LAB — BASIC METABOLIC PANEL
Anion gap: 8 (ref 5–15)
Anion gap: 9 (ref 5–15)
Anion gap: 9 (ref 5–15)
Anion gap: 9 (ref 5–15)
Anion gap: 10 (ref 5–15)
Anion gap: 6 (ref 5–15)
BUN: 79 mg/dL — ABNORMAL HIGH (ref 8–23)
BUN: 74 mg/dL — ABNORMAL HIGH (ref 8–23)
BUN: 71 mg/dL — ABNORMAL HIGH (ref 8–23)
BUN: 69 mg/dL — ABNORMAL HIGH (ref 8–23)
BUN: 67 mg/dL — ABNORMAL HIGH (ref 8–23)
BUN: 65 mg/dL — ABNORMAL HIGH (ref 8–23)
CHLORIDE: 113 mmol/L — AB (ref 98–111)
CO2: 28 mmol/L (ref 22–32)
CO2: 27 mmol/L (ref 22–32)
CO2: 27 mmol/L (ref 22–32)
CO2: 28 mmol/L (ref 22–32)
CO2: 26 mmol/L (ref 22–32)
CO2: 26 mmol/L (ref 22–32)
Calcium: 7.5 mg/dL — ABNORMAL LOW (ref 8.9–10.3)
Calcium: 7.6 mg/dL — ABNORMAL LOW (ref 8.9–10.3)
Calcium: 7.7 mg/dL — ABNORMAL LOW (ref 8.9–10.3)
Calcium: 7.8 mg/dL — ABNORMAL LOW (ref 8.9–10.3)
Calcium: 7.7 mg/dL — ABNORMAL LOW (ref 8.9–10.3)
Calcium: 7.6 mg/dL — ABNORMAL LOW (ref 8.9–10.3)
Chloride: 114 mmol/L — ABNORMAL HIGH (ref 98–111)
Chloride: 115 mmol/L — ABNORMAL HIGH (ref 98–111)
Chloride: 114 mmol/L — ABNORMAL HIGH (ref 98–111)
Chloride: 113 mmol/L — ABNORMAL HIGH (ref 98–111)
Chloride: 115 mmol/L — ABNORMAL HIGH (ref 98–111)
Creatinine, Ser: 2.77 mg/dL — ABNORMAL HIGH (ref 0.61–1.24)
Creatinine, Ser: 2.68 mg/dL — ABNORMAL HIGH (ref 0.61–1.24)
Creatinine, Ser: 2.57 mg/dL — ABNORMAL HIGH (ref 0.61–1.24)
Creatinine, Ser: 2.46 mg/dL — ABNORMAL HIGH (ref 0.61–1.24)
Creatinine, Ser: 2.4 mg/dL — ABNORMAL HIGH (ref 0.61–1.24)
Creatinine, Ser: 2.38 mg/dL — ABNORMAL HIGH (ref 0.61–1.24)
GFR calc Af Amer: 23 mL/min — ABNORMAL LOW (ref >60)
GFR calc Af Amer: 24 mL/min — ABNORMAL LOW (ref >60)
GFR calc Af Amer: 25 mL/min — ABNORMAL LOW (ref >60)
GFR calc Af Amer: 27 mL/min — ABNORMAL LOW (ref >60)
GFR calc non Af Amer: 20 mL/min — ABNORMAL LOW (ref >60)
GFR calc non Af Amer: 21 mL/min — ABNORMAL LOW (ref >60)
GFR calc non Af Amer: 22 mL/min — ABNORMAL LOW (ref >60)
GFR calc non Af Amer: 24 mL/min — ABNORMAL LOW (ref >60)
GFR calc non Af Amer: 24 mL/min — ABNORMAL LOW (ref >60)
GFR, EST AFRICAN AMERICAN: 28 mL/min — AB (ref >60)
GFR, EST AFRICAN AMERICAN: 28 mL/min — AB (ref >60)
GFR, EST NON AFRICAN AMERICAN: 23 mL/min — AB (ref >60)
GLUCOSE: 104 mg/dL — AB (ref 70–99)
GLUCOSE: 112 mg/dL — AB (ref 70–99)
Glucose, Bld: 107 mg/dL — ABNORMAL HIGH (ref 70–99)
Glucose, Bld: 131 mg/dL — ABNORMAL HIGH (ref 70–99)
Glucose, Bld: 149 mg/dL — ABNORMAL HIGH (ref 70–99)
Glucose, Bld: 130 mg/dL — ABNORMAL HIGH (ref 70–99)
Potassium: 3.5 mmol/L (ref 3.5–5.1)
Potassium: 3.3 mmol/L — ABNORMAL LOW (ref 3.5–5.1)
Potassium: 3.5 mmol/L (ref 3.5–5.1)
Potassium: 3.4 mmol/L — ABNORMAL LOW (ref 3.5–5.1)
Potassium: 3.2 mmol/L — ABNORMAL LOW (ref 3.5–5.1)
Potassium: 3.6 mmol/L (ref 3.5–5.1)
SODIUM: 149 mmol/L — AB (ref 135–145)
SODIUM: 147 mmol/L — AB (ref 135–145)
Sodium: 150 mmol/L — ABNORMAL HIGH (ref 135–145)
Sodium: 151 mmol/L — ABNORMAL HIGH (ref 135–145)
Sodium: 150 mmol/L — ABNORMAL HIGH (ref 135–145)
Sodium: 150 mmol/L — ABNORMAL HIGH (ref 135–145)

## 2019-03-21 LAB — CBC
HCT: 30.2 % — ABNORMAL LOW (ref 39.0–52.0)
Hemoglobin: 9.2 g/dL — ABNORMAL LOW (ref 13.0–17.0)
MCH: 31.2 pg (ref 26.0–34.0)
MCHC: 30.5 g/dL (ref 30.0–36.0)
MCV: 102.4 fL — ABNORMAL HIGH (ref 80.0–100.0)
Platelets: 102 10*3/uL — ABNORMAL LOW (ref 150–400)
RBC: 2.95 MIL/uL — ABNORMAL LOW (ref 4.22–5.81)
RDW: 17.1 % — ABNORMAL HIGH (ref 11.5–15.5)
WBC: 3 10*3/uL — ABNORMAL LOW (ref 4.0–10.5)
nRBC: 0 % (ref 0.0–0.2)

## 2019-03-21 MED ORDER — IPRATROPIUM BROMIDE 0.02 % IN SOLN
0.5000 mg | Freq: Three times a day (TID) | RESPIRATORY_TRACT | Status: DC
  Administered 2019-03-21: 0.5 mg via RESPIRATORY_TRACT
  Filled 2019-03-21: qty 2.5

## 2019-03-21 MED ORDER — IPRATROPIUM BROMIDE 0.02 % IN SOLN
0.5000 mg | Freq: Two times a day (BID) | RESPIRATORY_TRACT | Status: DC
  Administered 2019-03-21 – 2019-03-23 (×4): 0.5 mg via RESPIRATORY_TRACT
  Filled 2019-03-21 (×4): qty 2.5

## 2019-03-21 MED ORDER — POTASSIUM CHLORIDE CRYS ER 20 MEQ PO TBCR
40.0000 meq | EXTENDED_RELEASE_TABLET | Freq: Once | ORAL | Status: AC
  Administered 2019-03-21: 40 meq via ORAL
  Filled 2019-03-21: qty 2

## 2019-03-21 MED ORDER — LEVALBUTEROL HCL 0.63 MG/3ML IN NEBU
0.6300 mg | INHALATION_SOLUTION | Freq: Three times a day (TID) | RESPIRATORY_TRACT | Status: DC
  Administered 2019-03-21: 0.63 mg via RESPIRATORY_TRACT
  Filled 2019-03-21: qty 3

## 2019-03-21 MED ORDER — ENSURE ENLIVE PO LIQD
237.0000 mL | Freq: Two times a day (BID) | ORAL | Status: DC
  Administered 2019-03-21 – 2019-03-23 (×5): 237 mL via ORAL

## 2019-03-21 MED ORDER — LEVALBUTEROL HCL 0.63 MG/3ML IN NEBU
0.6300 mg | INHALATION_SOLUTION | Freq: Two times a day (BID) | RESPIRATORY_TRACT | Status: DC
  Administered 2019-03-21 – 2019-03-23 (×4): 0.63 mg via RESPIRATORY_TRACT
  Filled 2019-03-21 (×4): qty 3

## 2019-03-21 MED ORDER — LEVALBUTEROL HCL 0.63 MG/3ML IN NEBU: 0.6300 mg | INHALATION_SOLUTION | Freq: Four times a day (QID) | RESPIRATORY_TRACT | Status: DC | PRN

## 2019-03-21 MED ORDER — RESOURCE THICKENUP CLEAR PO POWD
ORAL | Status: DC | PRN
  Filled 2019-03-21: qty 125

## 2019-03-21 NOTE — Progress Notes (Signed)
Palliative:  I met today with Robert Barnett. He is sleepy but arouses and answers my questions. He is only oriented to self. No distress.   I called and spoke with daughter, Darol Destine. However, when I offered Shae support in attempting to answer any questions or give updates on her father's condition she was very frustrated. She was only interested in expressing her frustration with the visitor policy put in place today d/t Covid-19. She tells me that she cannot make any decisions for her father's care without seeing his progress. She understands that these guidelines are out of my control. Before I could attempt to provide any updates regarding his status (i.e. renal function, SLP recommendations, etc) she informed me that she was at work and could no longer discuss this with me.   At this time palliative will not follow up unless family more open to conversation. Goals are clear for full aggressive care including PEG placement per my colleagues original consult with family. Please call [phone] for any further acute concerns.   Plan: - Full, aggressive care desired.  - Palliative care signing off.   20 min  Vinie Sill, NP Palliative Medicine Team Pager # [phone] (M-F 8a-5p) Team Phone # [phone] (Nights/Weekends)

## 2019-03-21 NOTE — NC FL2 (Addendum)
[place] LEVEL OF CARE SCREENING TOOL     IDENTIFICATION  Patient Name: Robert Barnett Birthdate: 1932/04/19 Sex: male Admission Date (Current Location): 03/17/2019  [facility] and [place] Number:  [employer] and Address:  [facility], [place] [address] [place], [place] [address]      Provider Number: [id number]  Attending Physician Name and Address:  Kayleen Memos, DO  Relative Name and Phone Number:       Current Level of Care: Hospital Recommended Level of Care: [place] Prior Approval Number:    Date Approved/Denied:   PASRR Number: [id number]  Discharge Plan: SNF    Current Diagnoses: Patient Active Problem List   Diagnosis Date Noted  . CAD (coronary artery disease), native coronary artery 03/19/2019  . Mental status alteration 03/19/2019  . Fever 03/19/2019  . Diarrhea 03/19/2019  . Elevated troponin 03/19/2019  . AKI (acute kidney injury) ([place]) 03/17/2019  . Mass of breast, right 11/17/2018  . Cachexia ([place]) 08/10/2018  . History of CVA (cerebrovascular accident) 06/14/2018  . Chronic idiopathic constipation 06/14/2018  . Dietary folate deficiency anemia 06/14/2018  . Iron deficiency anemia due to dietary causes 06/14/2018  . Bowel and bladder incontinence 05/27/2018  . Pancreatic cyst   . Eczema 03/31/2018  . 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 CKD (chronic kidney disease) stage 4, GFR 15-29 ml/min (HCC) 09/03/2017  . Essential hypertension 07/30/2017  . Abdominal aortic atherosclerosis ([place]) 08/19/2016  . Bilateral low back pain without sciatica 08/18/2016  . Hypothyroidism 07/23/2016  . Moderate dementia without behavioral disturbance ([place]) 07/23/2016  . Hyperlipidemia 11/17/2014  . BPH (benign prostatic hyperplasia)     Orientation RESPIRATION BLADDER Height & Weight     Self, Place  Normal Incontinent, External catheter Weight: 199 lb 11.8 oz (90.6 kg) Height:  5\' 10"  (177.8  cm)  BEHAVIORAL SYMPTOMS/MOOD NEUROLOGICAL BOWEL NUTRITION STATUS  (None) (Moderate dementia without behavioral disturbance. History of CVA.) Incontinent Diet(DYS 1. Fluid nectar thick.)  AMBULATORY STATUS COMMUNICATION OF NEEDS Skin   Extensive Assist Verbally Skin abrasions, Other (Comment)(Skin tear.)                       Personal Care Assistance Level of Assistance  Total care       Total Care Assistance: Maximum assistance   Functional Limitations Info  Sight, Hearing, Speech Sight Info: Impaired Hearing Info: Adequate Speech Info: Adequate    SPECIAL CARE FACTORS FREQUENCY  PT (By licensed PT), Blood pressure, OT (By licensed OT), Speech therapy     PT Frequency: 5 x week OT Frequency: 5 x week     Speech Therapy Frequency: 5 x week      Contractures Contractures Info: Not present    Additional Factors Info  Code Status, Allergies   Isolation Code Status Info: Full code Allergies Info: NKDA    Enteric precautions.       Current Medications (03/21/2019):  This is the current hospital active medication list Current Facility-Administered Medications  Medication Dose Route Frequency Provider Last Rate Last Dose  . acetaminophen (TYLENOL) tablet 650 mg  650 mg Oral Q6H PRN Reubin Milan, MD       Or  . acetaminophen (TYLENOL) suppository 650 mg  650 mg Rectal Q6H PRN Reubin Milan, MD   650 mg at 03/18/19 2134  . aspirin EC tablet 81 mg  81 mg Oral Daily Reubin Milan, MD   [phone]  mg at 03/21/19 0909  . chlorhexidine (PERIDEX) 0.12 % solution 15 mL  15 mL Mouth Rinse BID Irene Pap N, DO   15 mL at 03/21/19 0909  . clopidogrel (PLAVIX) tablet 75 mg  75 mg Oral Daily Reubin Milan, MD   75 mg at 03/21/19 1610  . dextrose 5 % solution   Intravenous Continuous Dwana Melena, MD 125 mL/hr at 03/21/19 1214    . feeding supplement (ENSURE ENLIVE) (ENSURE ENLIVE) liquid 237 mL  237 mL Oral BID BM Hall, Carole N, DO      . fentaNYL (SUBLIMAZE)  injection 25 mcg  25 mcg Intravenous Q3H PRN Reubin Milan, MD   25 mcg at 03/18/19 2351  . heparin injection 5,000 Units  5,000 Units Subcutaneous [address], DO   5,000 Units at 03/21/19 [phone]  . ipratropium (ATROVENT) nebulizer solution 0.5 mg  0.5 mg Nebulization TID Donato Heinz, MD   0.5 mg at 03/21/19 0848  . levalbuterol (XOPENEX) nebulizer solution 0.63 mg  0.63 mg Nebulization TID Donato Heinz, MD   0.63 mg at 03/21/19 0848  . levothyroxine (SYNTHROID, LEVOTHROID) tablet 50 mcg  50 mcg Oral Daily Reubin Milan, MD   50 mcg at 03/21/19 5409  . MEDLINE mouth rinse  15 mL Mouth Rinse q12n4p Irene Pap N, DO   15 mL at 03/20/19 1729  . meropenem (MERREM) 1 g in sodium chloride 0.9 % 100 mL IVPB  1 g Intravenous Q12H Reubin Milan, MD 200 mL/hr at 03/21/19 0224 1 g at 03/21/19 0224  . nitroGLYCERIN (NITROSTAT) SL tablet 0.4 mg  0.4 mg Sublingual Q5 Min x 3 PRN Reubin Milan, MD      . ondansetron [facility]) tablet 4 mg  4 mg Oral Q6H PRN Reubin Milan, MD       Or  . ondansetron [facility]) injection 4 mg  4 mg Intravenous Q6H PRN Reubin Milan, MD      . Resource ThickenUp Clear   Oral PRN Kayleen Memos, DO         Discharge Medications: Please see discharge summary for a list of discharge medications.  Relevant Imaging Results:  Relevant Lab Results:   Additional Information SS#: [id number]  Candie Chroman, LCSW

## 2019-03-21 NOTE — Progress Notes (Signed)
  Speech Language Pathology Treatment: Dysphagia  Patient Details Name: Robert Barnett MRN: [id number] DOB: 28-Mar-1932 Today's Date: 03/21/2019 Time: 1740-8144 SLP Time Calculation (min) (ACUTE ONLY): 16 min  Assessment / Plan / Recommendation Clinical Impression  Pt observed with varying volumes of nectar thick for potential increase in bolus presentations after swallow assessment 3/21. Full teaspoon and cup sip consumed with delayed throat clear noted more when volume increased however uncertain direct correlation to po's. Instrumental assessment would be beneficial for recommendation of least restrictive liquid consistency and will plan on tomorrow morning.    HPI HPI: 85 y.o. male with medical history significant for dementia, CKD4, AAA, BPH, hypothyrodism, who was bought to the Ed with c/o mutiple episodes of loose stool and generalized weakness over the past week.  Today patient was not responding communicating as he used to.  History is obtained from daughter who is at bedside.  Patient do awake and alert now with improvement in mental status, is not cooperating and is unable to give me a history.  Daughter denies abdominal pain, reports patient complained of pain in his rectum.        SLP Plan  Continue with current plan of care       Recommendations  Diet recommendations: Nectar-thick liquid(1/2 tsp) Liquids provided via: Teaspoon(1/2 tsp) Medication Administration: Crushed with puree Supervision: Staff to assist with self feeding;Full supervision/cueing for compensatory strategies Compensations: Slow rate;Minimize environmental distractions;Small sips/bites;Lingual sweep for clearance of pocketing Postural Changes and/or Swallow Maneuvers: Seated upright 90 degrees                Oral Care Recommendations: Oral care BID Follow up Recommendations: 24 hour supervision/assistance SLP Visit Diagnosis: Dysphagia, unspecified (R13.10) Plan: Continue with current plan of  care       GO                Houston Siren 03/21/2019, 11:10 AM  Orbie Pyo Colvin Caroli.Ed Risk analyst [phone] Office [phone]

## 2019-03-21 NOTE — Progress Notes (Signed)
[facility] Progress Note    Assessment/ Plan:   85 year old BM with dementia, HTN.  Now has suffered an NSTEMI, likely aspiration PNA, UTI and A on CRF  1. Acute kidney injury on chronic kidney disease stage IIIb/IV: I suspect that this is primarily hemodynamically mediated in the setting of recent diarrhea/volume depletion with ongoing benazepril use.  urinalysis looks like UTI- culture pending and on meropenam. Imaging does not show hydro.  - Continue to avoid hypotension/iodinated intravenous contrast and nonsteroidal anti-inflammatory drugs.  - Fortunately his renal function is slowly improving. 2. Hyperkalemia: resolved 3. Mixed anion gap with non-gap metabolic acidosis: Secondary to losses from diarrhea as well as acute kidney injury. better 4. Fever/hypotension: Ongoing evaluation for focus of infection-UTI/PNA. He is on meropenam- blood cultures negative. Is not overloaded- agree with IVF.  ACE being held  5. Hypernatremia:Indicative of poor water intake/fluid intake possibly associated with his altered mental status-had been bolused with 1/2 NS 3/2 and had also rx d5w + 3 amps HCO3. - Increase  d5w to 138ml/hr. Will also check free water clearance in AM. 6. Anemia- not an issue   Subjective:   Denies f/c/n/v. Has appetite. Denies dyspnea or CP.   Objective:   BP 123/70 (BP Location: Left Arm)   Pulse 68   Temp 97.6 F (36.4 C) (Oral)   Resp 14   Ht 5\' 10"  (1.778 m)   Wt 90.6 kg   SpO2 94%   BMI 28.66 kg/m   Intake/Output Summary (Last 24 hours) at 03/21/2019 1021 Last data filed at 03/21/2019 0500 Gross per 24 hour  Intake 996.04 ml  Output 1500 ml  Net -503.96 ml   Weight change:   Physical Exam: General: alert today and cooperative; hard of hearing Heart: RRR Lungs: CBS bilat but still only [place] o2 Abdomen: soft, colocutaneous fistula Extremities: no edema GU: condom cath  Imaging: No results found.  Labs: BMET Recent Labs   Lab 03/19/19 0208 03/19/19 1440  03/20/19 0207 03/20/19 0558 03/20/19 0907 03/20/19 1232 03/20/19 1655 03/20/19 2159 03/21/19 0141 03/21/19 0555  NA 149* 153*   < > 150* 151* 151* 149* 150* 148* 150* 151*  K 4.3 4.7   < > 3.7 3.5 3.5 3.6 3.4* 3.2* 3.5 3.3*  CL 119* 113*   < > 113* 113* 114* 115* 116* 115* 114* 115*  CO2 19* 20*   < > 24 24 26 26 29 27 28 27   GLUCOSE 67* 72   < > 74 82 89 100* 104* 120* 104* 107*  BUN 112* 102*   < > 96* 92* 88* 88* 84* 81* 79* 74*  CREATININE 4.85* 4.12*   < > 3.76* 3.62* 3.50* 3.38* 3.24* 2.88* 2.77* 2.68*  CALCIUM 7.1* 7.8*   < > 7.4* 7.5* 7.6* 7.5* 7.5* 7.4* 7.5* 7.6*  PHOS 5.2* 5.0*  --  4.4  --   --   --   --   --   --   --    < > = values in this interval not displayed.   CBC Recent Labs  Lab 03/17/19 1625 03/18/19 0217 03/19/19 0023 03/20/19 0558 03/21/19 0555  WBC 7.6 5.7 6.2 3.8* 3.0*  NEUTROABS 5.1  --  4.3  --   --   HGB 11.8* 11.5* 10.7* 9.0* 9.2*  HCT 40.4 39.1 34.0* 29.3* 30.2*  MCV 109.8* 109.8* 103.0* 101.4* 102.4*  PLT 191 164 142* 111* 102*    Medications:    .  aspirin EC  81 mg Oral Daily  . chlorhexidine  15 mL Mouth Rinse BID  . clopidogrel  75 mg Oral Daily  . heparin injection (subcutaneous)  5,000 Units Subcutaneous Q8H  . ipratropium  0.5 mg Nebulization TID  . levalbuterol  0.63 mg Nebulization TID  . levothyroxine  50 mcg Oral Daily  . mouth rinse  15 mL Mouth Rinse q12n4p      Otelia Santee, MD 03/21/2019, 10:21 AM

## 2019-03-21 NOTE — Progress Notes (Signed)
PROGRESS NOTE  Robert Barnett SVX:[id number] DOB: 03/05/1932 DOA: 03/17/2019 PCP: Janith Lima, MD  HPI/Recap of past 24 hours: Robert Barnett is a 85 y.o. male from home with medical history significant for dementia, CKD4, AAA, BPH, hypothyrodism, who was bought to the Ed with c/o mutiple episodes of loose stool and generalized weakness over the past week.  Today patient was not responding communicating as he used to.  History is obtained from daughter who is at bedside.  Patient do awake and alert now with improvement in mental status, is not cooperating and is unable to give me a history.  Daughter denies abdominal pain, reports patient complained of pain in his rectum.   No difficulty breathing normal, no cough, no fever or chills, no sick contacts, no recent travels.  At baseline patient has dementia, most times he recognizes close family members, answers simple questions, but needs assistance with most ADLs.  On EMS arrival blood pressure 60/40.    Elevated troponin which trended down suspect secondary to demand ischemia from acute illness.  2D echo unremarkable.  Cardiology consulted and signed off.  Significant AKI and hypovolemic hypernatremia, suspect prerenal secondary to severe dehydration  Hospital course complicated by somnolence, lethargy, improved after initiation of IV fluid hydration.  03/21/19: Patient seen and examined at his bedside.  No acute events overnight.  He is more alert and interactive today.  He has no new complaints.   Assessment/Plan: Active Problems:   Moderate dementia without behavioral disturbance (HCC)   Essential hypertension   Colovesical fistula   CKD (chronic kidney disease) stage 4, GFR 15-29 ml/min (HCC)   History of CVA (cerebrovascular accident)   AKI (acute kidney injury) ([place])   CAD (coronary artery disease), native coronary artery   Mental status alteration   Fever   Diarrhea   Elevated troponin  Resolving acute  metabolic encephalopathy in the setting of dementia suspect secondary to severe dehydration versus aspiration pneumonia versus others Improving, he is more alert and interactive today Continue IV fluid hydration On D5W at 100 125 cc/h per nephrology Continue to closely monitor sodium level  BMP every 4 hours  Aspiration pneumonia Presented with reported fever 102.9, hypoxia, dysphagia, chest x-ray suggestive of right middle lobe pneumonia with suspicion for aspiration Aspiration precautions Recommendations per speech therapist On meropenem day #3  Improving AKI on CKD 4 suspect prerenal secondary to dehydration Presented with creatinine of 4.12 Creatinine continues to improve 2.77 from 3.50 on 03/20/2019 Continue to avoid nephrotoxic agents/dehydration/hypotension Repeat BMP  Hypovolemic hypernatremia Sodium plateaued at 150 D5W increased to 125 cc/h per nephrology Continue close monitoring of sodium level BMP every 4 hours  Dysphagia Speech therapist evaluated recommended dysphagia 1 with nectar thick via half teaspoon amount only Aspiration precautions Feeding assistance  Elevated troponin suspect secondary to demand ischemia in the setting of acute illness Troponins peaked at 2.75 and trended down 2D echo done and unremarkable per cardiology Denies chest pain  Diarrhea secondary to enteropathogenic E. coli C. difficile negative GI panel positive for enteropathogenic E. coli Continue to closely monitor for any sign of dehydration  Physical debility/ambulatory dysfunction Patient is from home PT assessed and recommended SNF CSW consulted for placement Fall precautions  Severe protein calorie malnutrition Albumin 1.5 with BMI of 28 Oral supplement Encourage increase oral protein calorie intake  Resolved hypotension Suspect secondary to volume loss severe dehydration with persistent diarrhea Blood pressure is improved and maintaining map greater than 65 Continue to  closely  monitor vital signs     Code Status: Full code  Family Communication: None at bedside this morning  Disposition Plan: SNF when bed is available or when nephrology signs off.   Consultants:  Nephrology  Cardiology signed off on 03/20/2019  Procedures:  2D echo  Antimicrobials:  Meropenem  DVT prophylaxis: Subcu heparin 3 times daily   Objective: Vitals:   03/20/19 2320 03/21/19 0500 03/21/19 0815 03/21/19 0848  BP: (!) 121/59 132/62 123/70   Pulse: 64 63 68   Resp: 18 (!) 21 14   Temp: (!) 97.3 F (36.3 C) (!) 97.4 F (36.3 C) 97.6 F (36.4 C)   TempSrc: Axillary Oral Oral   SpO2:  98% 93% 94%  Weight:  90.6 kg    Height:        Intake/Output Summary (Last 24 hours) at 03/21/2019 1046 Last data filed at 03/21/2019 0500 Gross per 24 hour  Intake 996.04 ml  Output 1500 ml  Net -503.96 ml   Filed Weights   03/19/19 0100 03/19/19 0556 03/21/19 0500  Weight: 87 kg 90 kg 90.6 kg    Exam:  . General: 85 y.o. year-old male well developed well-nourished in no acute distress.  Alert and interactive in the setting of dementia. . Cardiovascular: Regular rate and rhythm with no rubs or gallops.  No JVD or thyromegaly. Marland Kitchen Respiratory: Mild rales at bases with no wheezes.  Poor inspiratory effort.  Abdomen: Soft nontender nondistended with normal bowel sounds x4 quadrants. . Musculoskeletal: No lower extremity edema. 2/4 pulses in all 4 extremities. Marland Kitchen Psychiatry: Mood is appropriate for condition and setting   Data Reviewed: CBC: Recent Labs  Lab 03/17/19 1625 03/18/19 0217 03/19/19 0023 03/20/19 0558 03/21/19 0555  WBC 7.6 5.7 6.2 3.8* 3.0*  NEUTROABS 5.1  --  4.3  --   --   HGB 11.8* 11.5* 10.7* 9.0* 9.2*  HCT 40.4 39.1 34.0* 29.3* 30.2*  MCV 109.8* 109.8* 103.0* 101.4* 102.4*  PLT 191 164 142* 111* 412*   Basic Metabolic Panel: Recent Labs  Lab 03/19/19 0208 03/19/19 1440  03/20/19 0207  03/20/19 1655 03/20/19 2159 03/21/19 0141  03/21/19 0555 03/21/19 0928  NA 149* 153*   < > 150*   < > 150* 148* 150* 151* 150*  K 4.3 4.7   < > 3.7   < > 3.4* 3.2* 3.5 3.3* 3.5  CL 119* 113*   < > 113*   < > 116* 115* 114* 115* 114*  CO2 19* 20*   < > 24   < > 29 27 28 27 27   GLUCOSE 67* 72   < > 74   < > 104* 120* 104* 107* 112*  BUN 112* 102*   < > 96*   < > 84* 81* 79* 74* 71*  CREATININE 4.85* 4.12*   < > 3.76*   < > 3.24* 2.88* 2.77* 2.68* 2.57*  CALCIUM 7.1* 7.8*   < > 7.4*   < > 7.5* 7.4* 7.5* 7.6* 7.7*  PHOS 5.2* 5.0*  --  4.4  --   --   --   --   --   --    < > = values in this interval not displayed.   GFR: Estimated Creatinine Clearance: 24.2 mL/min (A) (by C-G formula based on SCr of 2.57 mg/dL (H)). Liver Function Tests: Recent Labs  Lab 03/17/19 1625 03/19/19 0023 03/19/19 0208 03/19/19 1440 03/20/19 0207  AST 61* 80*  --   --   --  ALT 36 32  --   --   --   ALKPHOS 68 64  --   --   --   BILITOT 0.5 0.7  --   --   --   PROT 7.1 6.1*  --   --   --   ALBUMIN 2.6* 2.0* 1.9* 1.5* 1.5*   Recent Labs  Lab 03/17/19 1625  LIPASE 72*   No results for input(s): AMMONIA in the last 168 hours. Coagulation Profile: Recent Labs  Lab 03/19/19 0208  INR 1.3*   Cardiac Enzymes: Recent Labs  Lab 03/19/19 0023 03/19/19 0906 03/19/19 1459 03/19/19 2124  TROPONINI 2.75* QUANTITY NOT SUFFICIENT, UNABLE TO PERFORM TEST 0.86* 0.67*   BNP (last 3 results) No results for input(s): PROBNP in the last 8760 hours. HbA1C: No results for input(s): HGBA1C in the last 72 hours. CBG: No results for input(s): GLUCAP in the last 168 hours. Lipid Profile: No results for input(s): CHOL, HDL, LDLCALC, TRIG, CHOLHDL, LDLDIRECT in the last 72 hours. Thyroid Function Tests: No results for input(s): TSH, T4TOTAL, FREET4, T3FREE, THYROIDAB in the last 72 hours. Anemia Panel: No results for input(s): VITAMINB12, FOLATE, FERRITIN, TIBC, IRON, RETICCTPCT in the last 72 hours. Urine analysis:    Component Value Date/Time    COLORURINE YELLOW 03/18/2019 1500   APPEARANCEUR CLOUDY (A) 03/18/2019 1500   LABSPEC 1.012 03/18/2019 1500   PHURINE 5.0 03/18/2019 1500   GLUCOSEU NEGATIVE 03/18/2019 1500   GLUCOSEU NEGATIVE 12/30/2017 1443   HGBUR LARGE (A) 03/18/2019 1500   BILIRUBINUR NEGATIVE 03/18/2019 1500   KETONESUR NEGATIVE 03/18/2019 1500   PROTEINUR NEGATIVE 03/18/2019 1500   UROBILINOGEN 0.2 12/30/2017 1443   NITRITE NEGATIVE 03/18/2019 1500   LEUKOCYTESUR LARGE (A) 03/18/2019 1500   Sepsis Labs: @LABRCNTIP (procalcitonin:4,lacticidven:4)  ) Recent Results (from the past 240 hour(s))  Blood Culture (routine x 2)     Status: None (Preliminary result)   Collection Time: 03/17/19  4:25 PM  Result Value Ref Range Status   Specimen Description BLOOD RIGHT HAND  Final   Special Requests AEROBIC BOTTLE ONLY Blood Culture adequate volume  Final   Culture   Final    NO GROWTH 4 DAYS Performed at [facility], [address], [place], [place] [address]    Report Status PENDING  Incomplete  Blood Culture (routine x 2)     Status: None (Preliminary result)   Collection Time: 03/17/19  9:12 PM  Result Value Ref Range Status   Specimen Description BLOOD RIGHT ANTECUBITAL  Final   Special Requests   Final    BOTTLES DRAWN AEROBIC AND ANAEROBIC Blood Culture adequate volume   Culture   Final    NO GROWTH 4 DAYS Performed at [facility], [address] [place], [place] [address]    Report Status PENDING  Incomplete  Gastrointestinal Panel by PCR , Stool     Status: Abnormal   Collection Time: 03/18/19  9:00 AM  Result Value Ref Range Status   Campylobacter species NOT DETECTED NOT DETECTED Final   Plesimonas shigelloides NOT DETECTED NOT DETECTED Final   Salmonella species NOT DETECTED NOT DETECTED Final   Yersinia enterocolitica NOT DETECTED NOT DETECTED Final   Vibrio species NOT DETECTED NOT DETECTED Final   Vibrio cholerae NOT DETECTED NOT DETECTED Final   Enteroaggregative E coli (EAEC) NOT DETECTED  NOT DETECTED Final   Enteropathogenic E coli (EPEC) DETECTED (A) NOT DETECTED Final    Comment: RESULT CALLED TO, READ BACK BY AND VERIFIED WITH:  AMINA MOUHAMED AT 2951 03/19/19 SDR    Enterotoxigenic E coli (ETEC) NOT DETECTED NOT DETECTED Final   Shiga like toxin producing E coli (STEC) NOT DETECTED NOT DETECTED Final   Shigella/Enteroinvasive E coli (EIEC) NOT DETECTED NOT DETECTED Final   Cryptosporidium NOT DETECTED NOT DETECTED Final   Cyclospora cayetanensis NOT DETECTED NOT DETECTED Final   Entamoeba histolytica NOT DETECTED NOT DETECTED Final   Giardia lamblia NOT DETECTED NOT DETECTED Final   Adenovirus F40/41 NOT DETECTED NOT DETECTED Final   Astrovirus NOT DETECTED NOT DETECTED Final   Norovirus GI/GII NOT DETECTED NOT DETECTED Final   Rotavirus A NOT DETECTED NOT DETECTED Final   Sapovirus (I, II, IV, and V) NOT DETECTED NOT DETECTED Final    Comment: Performed at [facility], [address] [place], [place] [address]  C Difficile Quick Screen w PCR reflex     Status: None   Collection Time: 03/18/19  9:00 AM  Result Value Ref Range Status   C Diff antigen NEGATIVE NEGATIVE Final   C Diff toxin NEGATIVE NEGATIVE Final   C Diff interpretation No C. difficile detected.  Final    Comment: Performed at [facility], [address] [place], [place] [address]      Studies: No results found.  Scheduled Meds: . aspirin EC  81 mg Oral Daily  . chlorhexidine  15 mL Mouth Rinse BID  . clopidogrel  75 mg Oral Daily  . heparin injection (subcutaneous)  5,000 Units Subcutaneous Q8H  . ipratropium  0.5 mg Nebulization TID  . levalbuterol  0.63 mg Nebulization TID  . levothyroxine  50 mcg Oral Daily  . mouth rinse  15 mL Mouth Rinse q12n4p    Continuous Infusions: . dextrose 100 mL/hr at 03/21/19 0640  . meropenem (MERREM) IV 1 g (03/21/19 0224)     LOS: 4 days     Kayleen Memos, MD [facility] Pager [phone]  If 7PM-7AM, please contact  night-coverage [email or web address] Password [facility] 03/21/2019, 10:46 AM

## 2019-03-21 NOTE — TOC Initial Note (Signed)
Transition of Care [facility]) - Initial/Assessment Note    Patient Details  Name: Robert Barnett MRN: [id number] Date of Birth: 1932/03/17  [facility] of [facility]) CM/SW Contact:    Candie Chroman, LCSW Phone Number: 03/21/2019, 1:16 PM  Clinical Narrative:  Patient not fully oriented. Family unable to visit due to COVID-19 visitor restrictions. CSW called patient's daughter Carlyon Shadow. CSW introduced role and explained that PT recommendations would be discussed. Darlene asked CSW to call her sister [name] to discuss SNF placement. [name] is unsure if she wants SNF or not. There is concern with not being able to visit him at the facility. She is also concerned about his feeding since he can't see/feed himself. Discussed potential for home health but made her aware of level of assistance patient is needing at this time. She stated that home health has not been beneficial in the past. [name] will discuss with her family and update CSW with decision. She gave CSW permission to go ahead and send out SNF referral. She prefers the [place] area. No further concerns. CSW encouraged patient's daughters to contact CSW as needed. CSW will continue to follow patient and his daughters for support and facilitate discharge to SNF vs. Home once medically stable.            Expected Discharge Plan: Skilled Nursing Facility Barriers to Discharge: Continued Medical Work up, Ship broker   Patient Goals and CMS Choice Patient states their goals for this hospitalization and ongoing recovery are:: Patient not fully oriented. Dementia. CMS Medicare.gov Compare Post Acute Care list provided to:: Other (Comment Required)(Daughter prefers to review after we have bed offers.)    Expected Discharge Plan and Services Expected Discharge Plan: [place] In-house Referral: Hospice / Major arrangements for the past 2 months: Single Family Home Expected Discharge Date: 03/20/19                         Prior Living Arrangements/Services Living arrangements for the past 2 months: Single Family Home Lives with:: Adult Children Patient language and need for interpreter reviewed:: No Do you feel safe going back to the place where you live?: Yes      Need for Family Participation in Patient Care: Yes (Comment) Care giver support system in place?: Yes (comment)(Children at home. Patient has a total of six children.)   Criminal Activity/Legal Involvement Pertinent to Current Situation/Hospitalization: No - Comment as needed  Activities of Daily Living Home Assistive Devices/Equipment: None ADL Screening (condition at time of admission) Patient's cognitive ability adequate to safely complete daily activities?: No Is the patient deaf or have difficulty hearing?: Yes Does the patient have difficulty seeing, even when wearing glasses/contacts?: Yes Does the patient have difficulty concentrating, remembering, or making decisions?: Yes Patient able to express need for assistance with ADLs?: No Does the patient have difficulty dressing or bathing?: Yes Independently performs ADLs?: No Communication: Independent Dressing (OT): Dependent Is this a change from baseline?: Pre-admission baseline Grooming: Dependent Is this a change from baseline?: Pre-admission baseline Feeding: Dependent Is this a change from baseline?: Pre-admission baseline Bathing: Dependent Is this a change from baseline?: Pre-admission baseline Toileting: Dependent Is this a change from baseline?: Pre-admission baseline In/Out Bed: Dependent Is this a change from baseline?: Pre-admission baseline Walks in Home: Dependent Is this a change from baseline?: Pre-admission baseline Does the patient have difficulty walking or climbing stairs?: Yes Weakness of Legs: Both Weakness of Arms/Hands:  Both  Permission Sought/Granted Permission sought to share information with : Facility Sport and exercise psychologist,  Family Supports Permission granted to share information with : No(Patient not fully oriented. Dementia.)  Share Information with NAME: Darlene Dirocco, Claudette Stapler  Permission granted to share info w AGENCY: SNF's  Permission granted to share info w Relationship: Daughters  Permission granted to share info w Contact Information: Darlene: [phone]: [phone]  Emotional Assessment Appearance:: Appears stated age Attitude/Demeanor/Rapport: Unable to Assess Affect (typically observed): Unable to Assess Orientation: : Oriented to Self, Oriented to Place Alcohol / Substance Use: Never Used Psych Involvement: No (comment)  Admission diagnosis:  Altered mental status, unspecified altered mental status type [R41.82] Diarrhea, unspecified type [R19.7] Sepsis with acute renal failure without septic shock, due to unspecified organism, unspecified acute renal failure type ([place]) [A41.9, R65.20, N17.9] Patient Active Problem List   Diagnosis Date Noted  . CAD (coronary artery disease), native coronary artery 03/19/2019  . Mental status alteration 03/19/2019  . Fever 03/19/2019  . Diarrhea 03/19/2019  . Elevated troponin 03/19/2019  . AKI (acute kidney injury) ([place]) 03/17/2019  . Mass of breast, right 11/17/2018  . Cachexia ([place]) 08/10/2018  . History of CVA (cerebrovascular accident) 06/14/2018  . Chronic idiopathic constipation 06/14/2018  . Dietary folate deficiency anemia 06/14/2018  . Iron deficiency anemia due to dietary causes 06/14/2018  . Bowel and bladder incontinence 05/27/2018  . Pancreatic cyst   . Eczema 03/31/2018  . 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 CKD (chronic kidney disease) stage 4, GFR 15-29 ml/min (HCC) 09/03/2017  . Essential hypertension 07/30/2017  . Abdominal aortic atherosclerosis ([place]) 08/19/2016  . Bilateral low back pain without sciatica 08/18/2016  . Hypothyroidism 07/23/2016  . Moderate dementia without behavioral disturbance ([place]) 07/23/2016  .  Hyperlipidemia 11/17/2014  . BPH (benign prostatic hyperplasia)    PCP:  Janith Lima, MD Pharmacy:   [id number] - [place], [address] [place] [address] Phone: [phone] Fax: [phone]     Social Determinants of Health (SDOH) Interventions    Readmission Risk Interventions No flowsheet data found.

## 2019-03-21 NOTE — Clinical Social Work Note (Signed)
Left voicemail for patient's daughter on mobile number. Tried on home number as well but no answer. Did not leave a voicemail here. Will discuss SNF placement when she calls back.  Dayton Scrape, [place]

## 2019-03-22 ENCOUNTER — Inpatient Hospital Stay (HOSPITAL_COMMUNITY): Payer: Medicare Other

## 2019-03-22 LAB — BASIC METABOLIC PANEL
Anion gap: 3 — ABNORMAL LOW (ref 5–15)
Anion gap: 5 (ref 5–15)
Anion gap: 6 (ref 5–15)
Anion gap: 10 (ref 5–15)
BUN: 57 mg/dL — ABNORMAL HIGH (ref 8–23)
BUN: 54 mg/dL — ABNORMAL HIGH (ref 8–23)
BUN: 53 mg/dL — ABNORMAL HIGH (ref 8–23)
BUN: 51 mg/dL — ABNORMAL HIGH (ref 8–23)
CO2: 27 mmol/L (ref 22–32)
CO2: 26 mmol/L (ref 22–32)
CO2: 30 mmol/L (ref 22–32)
CO2: 27 mmol/L (ref 22–32)
CREATININE: 1.97 mg/dL — AB (ref 0.61–1.24)
Calcium: 7.7 mg/dL — ABNORMAL LOW (ref 8.9–10.3)
Calcium: 7.8 mg/dL — ABNORMAL LOW (ref 8.9–10.3)
Calcium: 7.8 mg/dL — ABNORMAL LOW (ref 8.9–10.3)
Calcium: 8.1 mg/dL — ABNORMAL LOW (ref 8.9–10.3)
Chloride: 118 mmol/L — ABNORMAL HIGH (ref 98–111)
Chloride: 116 mmol/L — ABNORMAL HIGH (ref 98–111)
Chloride: 113 mmol/L — ABNORMAL HIGH (ref 98–111)
Chloride: 112 mmol/L — ABNORMAL HIGH (ref 98–111)
Creatinine, Ser: 2.08 mg/dL — ABNORMAL HIGH (ref 0.61–1.24)
Creatinine, Ser: 2.12 mg/dL — ABNORMAL HIGH (ref 0.61–1.24)
Creatinine, Ser: 2.03 mg/dL — ABNORMAL HIGH (ref 0.61–1.24)
GFR calc Af Amer: 33 mL/min — ABNORMAL LOW (ref >60)
GFR calc Af Amer: 32 mL/min — ABNORMAL LOW (ref >60)
GFR calc Af Amer: 34 mL/min — ABNORMAL LOW (ref >60)
GFR calc Af Amer: 35 mL/min — ABNORMAL LOW (ref >60)
GFR calc non Af Amer: 28 mL/min — ABNORMAL LOW (ref >60)
GFR calc non Af Amer: 28 mL/min — ABNORMAL LOW (ref >60)
GFR calc non Af Amer: 29 mL/min — ABNORMAL LOW (ref >60)
GFR calc non Af Amer: 30 mL/min — ABNORMAL LOW (ref >60)
Glucose, Bld: 129 mg/dL — ABNORMAL HIGH (ref 70–99)
Glucose, Bld: 107 mg/dL — ABNORMAL HIGH (ref 70–99)
Glucose, Bld: 124 mg/dL — ABNORMAL HIGH (ref 70–99)
Glucose, Bld: 106 mg/dL — ABNORMAL HIGH (ref 70–99)
Potassium: 3.8 mmol/L (ref 3.5–5.1)
Potassium: 4.4 mmol/L (ref 3.5–5.1)
Potassium: 3.9 mmol/L (ref 3.5–5.1)
Potassium: 4 mmol/L (ref 3.5–5.1)
SODIUM: 148 mmol/L — AB (ref 135–145)
SODIUM: 149 mmol/L — AB (ref 135–145)
Sodium: 147 mmol/L — ABNORMAL HIGH (ref 135–145)
Sodium: 149 mmol/L — ABNORMAL HIGH (ref 135–145)

## 2019-03-22 LAB — RENAL FUNCTION PANEL
ANION GAP: 9 (ref 5–15)
Albumin: 1.6 g/dL — ABNORMAL LOW (ref 3.5–5.0)
BUN: 62 mg/dL — ABNORMAL HIGH (ref 8–23)
CALCIUM: 7.7 mg/dL — AB (ref 8.9–10.3)
CO2: 27 mmol/L (ref 22–32)
Chloride: 114 mmol/L — ABNORMAL HIGH (ref 98–111)
Creatinine, Ser: 2.3 mg/dL — ABNORMAL HIGH (ref 0.61–1.24)
GFR calc Af Amer: 29 mL/min — ABNORMAL LOW (ref >60)
GFR calc non Af Amer: 25 mL/min — ABNORMAL LOW (ref >60)
Glucose, Bld: 126 mg/dL — ABNORMAL HIGH (ref 70–99)
Phosphorus: 2.5 mg/dL (ref 2.5–4.6)
Potassium: 3.8 mmol/L (ref 3.5–5.1)
Sodium: 150 mmol/L — ABNORMAL HIGH (ref 135–145)

## 2019-03-22 LAB — CULTURE, BLOOD (ROUTINE X 2)
Culture: NO GROWTH
Culture: NO GROWTH
Special Requests: ADEQUATE
Special Requests: ADEQUATE

## 2019-03-22 LAB — MRSA PCR SCREENING: MRSA BY PCR: NEGATIVE

## 2019-03-22 LAB — NA AND K (SODIUM & POTASSIUM), RAND UR
Potassium Urine: 15 mmol/L
Sodium, Ur: 88 mmol/L

## 2019-03-22 MED ORDER — GERHARDT'S BUTT CREAM
TOPICAL_CREAM | Freq: Two times a day (BID) | CUTANEOUS | Status: DC
  Administered 2019-03-22 – 2019-03-24 (×5): via TOPICAL
  Filled 2019-03-22: qty 1

## 2019-03-22 NOTE — Plan of Care (Signed)
  Problem: Health Behavior/Discharge Planning: Goal: Ability to manage health-related needs will improve Outcome: Progressing   Problem: Clinical Measurements: Goal: Ability to maintain clinical measurements within normal limits will improve Outcome: Progressing Goal: Will remain free from infection Outcome: Progressing Goal: Diagnostic test results will improve Outcome: Progressing Goal: Cardiovascular complication will be avoided Outcome: Progressing   Problem: Pain Managment: Goal: General experience of comfort will improve Outcome: Progressing   Problem: Safety: Goal: Ability to remain free from injury will improve Outcome: Progressing   Problem: Skin Integrity: Goal: Risk for impaired skin integrity will decrease Outcome: Progressing

## 2019-03-22 NOTE — Progress Notes (Signed)
Palliative:  Discussed with Dr. [place] Crane. Palliative will sign off at this time. Please re-consult for decline or if family more open to conversations regarding [place]. Thank you for this consult.   No charge  Vinie Sill, NP Palliative Medicine Team [phone]

## 2019-03-22 NOTE — Plan of Care (Signed)

## 2019-03-22 NOTE — Progress Notes (Signed)
Pharmacy Antibiotic Note  Robert Barnett is a 85 y.o. male admitted on 03/17/2019 with aspiration pneumonia.  Pharmacy has been consulted for meropenem dosing.  On day #6 of antibiotics. WBC 3, Scr 2.08 (CrCl 29 mL/min). Afebrile. GI panel 3/20 grew enteropathogenic E Coli.    Plan: Continue Meropenem 1gm IV q 12hr F/u duration of therapy  Height: 5\' 10"  (177.8 cm) Weight: 199 lb 11.8 oz (90.6 kg) IBW/kg (Calculated) : 73  Temp (24hrs), Avg:97.8 F (36.6 C), Min:97.4 F (36.3 C), Max:98 F (36.7 C)  Recent Labs  Lab 03/17/19 1625 03/17/19 2112 03/18/19 0217  03/19/19 0023  03/20/19 0558  03/21/19 0555  03/21/19 1318 03/21/19 1905 03/21/19 2307 03/22/19 0357 03/22/19 1011  WBC 7.6  --  5.7  --  6.2  --  3.8*  --  3.0*  --   --   --   --   --   --   CREATININE 6.62*  --  5.83*   < > 5.08*   < > 3.62*   < > 2.68*   < > 2.46* 2.40* 2.38* 2.30* 2.08*  LATICACIDVEN 2.4* 2.3* 1.7  --  1.6  --   --   --   --   --   --   --   --   --   --    < > = values in this interval not displayed.    Estimated Creatinine Clearance: 29.9 mL/min (A) (by C-G formula based on SCr of 2.08 mg/dL (H)).    No Known Allergies  Antimicrobials this admission: Meropenem 3/21>> Ceftrtiaxone 2 gm IV q24h  3/19- 3/20 Metronidazole 500mg  IV q 8hr  3/20 for 3 doses  Dose adjustments this admission: Meropenem dosed q12 hr   Microbiology results: 3/19 Bcx: NGTD 3/20 cdiff neg  3/20 GI panel enteropathogenic E Coli  Thank you for allowing pharmacy to be a part of this patient's care.  Antonietta Jewel, PharmD, [place] Clinical Pharmacist  Pager: [phone] Phone: [phone] 03/22/2019 11:17 AM

## 2019-03-22 NOTE — Care Management (Signed)
CM informed family would like to take pt home and are now declining SNF.  Pt family request hospital bed and hoyer life - orders written.  Medicare.gov list given for DME - [facility] chosen - referral given and accepted by [facility]. Attending is a aware of discharge plan change to home

## 2019-03-22 NOTE — Progress Notes (Signed)
Modified Barium Swallow Progress Note  Patient Details  Name: Devarious Pavek MRN: [id number] Date of Birth: [birth year]  Today's Date: 03/22/2019  Modified Barium Swallow completed.  Full report located under Chart Review in the Imaging Section.  Brief recommendations include the following:  Clinical Impression  Pt has a moderate oropharyngeal dysphagia that appears to be a combined result of cognitive impairments and generalized weakness. He has resistance to oral intake, often pursuing his lips or pushing his tongue against the spoon/cup, making it difficult to challenge him with larger boluses. He could not obtain liquid via straw. Oral holding and lingual rocking is observed despite Mod-Max cues for timely posterior transfer. Mild lingual residue is present across all consistencies, although slightly more so with purees. His pharyngeal swallow triggers in a timely manner but he has reduced hyolaryngeal movement, base of tongue retraction, and pharyngeal squeeze. Mild residue is in the pyriform sinuses with nectar thick liquids, with more moderate residue in the valleculae and pyriform sinuses given purees. Pt single episodes of trace and transient penetration with both thin and nectar thick liquids, but all throat clearing and coughing was observed to be in the absence of any airway compromise. Recommend Dys 1 (puree) diet and thin liquids by cup or spoon. Cognitive deficits make it more likely that episodic aspiration could occur, and they also make it more likely that he will not get enough nutrition/hydration. Therefore, full supervision during PO intake is recommend to alleviate risk as much as possible.   Swallow Evaluation Recommendations       SLP Diet Recommendations: Dysphagia 1 (Puree) solids;Thin liquid   Liquid Administration via: Cup;Spoon   Medication Administration: Crushed with puree   Supervision: Staff to assist with self feeding;Full supervision/cueing for  compensatory strategies   Compensations: Minimize environmental distractions;Slow rate;Small sips/bites;Lingual sweep for clearance of pocketing   Postural Changes: Seated upright at 90 degrees   Oral Care Recommendations: Oral care BID        Venita Sheffield Timo Hartwig 03/22/2019,12:25 PM   Pollyann Glen, M.A. [place] Acute Environmental education officer [phone] Office [phone]

## 2019-03-22 NOTE — Care Management (Signed)
    Durable Medical Equipment  (From admission, onward)         Start     Ordered   03/22/19 1044  For home use only DME Hospital bed  Once    Comments:  Dementia  Question Answer Comment  Patient has (list medical condition): dementia   The above medical condition requires: Patient requires the ability to reposition frequently   Head must be elevated greater than: 30 degrees   Bed type Semi-electric   Hoyer Lift Yes   Support Surface: Gel Overlay      03/22/19 1044

## 2019-03-22 NOTE — Progress Notes (Signed)
PROGRESS NOTE  Robert Barnett VFI:[id number] DOB: [birth year] DOA: 03/17/2019 PCP: Janith Lima, MD  HPI/Recap of past 24 hours: Robert Barnett is a 85 y.o. male from home with medical history significant for dementia, CKD4, AAA, BPH, hypothyrodism, who was bought to the Ed with c/o mutiple episodes of loose stool and generalized weakness over the past week.  Today patient was not responding communicating as he used to.  History is obtained from daughter who is at bedside.  Patient do awake and alert now with improvement in mental status, is not cooperating and is unable to give me a history.  Daughter denies abdominal pain, reports patient complained of pain in his rectum.   No difficulty breathing normal, no cough, no fever or chills, no sick contacts, no recent travels.  At baseline patient has dementia, most times he recognizes close family members, answers simple questions, but needs assistance with most ADLs.  On EMS arrival blood pressure 60/40.    Elevated troponin which trended down suspect secondary to demand ischemia from acute illness.  2D echo unremarkable.  Cardiology consulted and signed off.  Significant AKI and hypovolemic hypernatremia, suspect prerenal secondary to severe dehydration  Hospital course complicated by somnolence, lethargy, improved after initiation of IV fluid hydration.  03/22/19: Patient seen and examined at his bedside.  No acute events overnight.  He is more alert and interactive today.  He has a reproducible right upper chest pain.  Denies dyspnea or palpitations.  He has no other complaints.    Assessment/Plan: Active Problems:   Moderate dementia without behavioral disturbance (HCC)   Essential hypertension   Colovesical fistula   CKD (chronic kidney disease) stage 4, GFR 15-29 ml/min (HCC)   History of CVA (cerebrovascular accident)   AKI (acute kidney injury) ([place])   CAD (coronary artery disease), native coronary artery   Mental  status alteration   Fever   Diarrhea   Elevated troponin   Goals of care, counseling/discussion   Palliative care encounter  Resolving acute metabolic encephalopathy in the setting of dementia suspect secondary to severe dehydration versus aspiration pneumonia versus others Improving, he is more alert and interactive today Continue IV fluid hydration On D5W at 100 125 cc/h per nephrology Continue to closely monitor sodium level  BMP every 4 hours  Aspiration pneumonia Presented with reported fever 102.9, hypoxia, dysphagia, chest x-ray suggestive of right middle lobe pneumonia with suspicion for aspiration Aspiration precautions Recommendations per speech therapist for nectar thick liquid diet On meropenem day #4  Dysphagia Speech therapist following and recommended nectar thick liquid Aspiration precautions in place Per family full scope of treatment Possible PEG tube feeding placement by interventional radiology for discharge planning Will discuss with patient's daughter Continue feeding assistance for now  Improving AKI on CKD 4 suspect prerenal secondary to dehydration Presented with creatinine of 4.12 Creatinine continues to improve 2.30 from 2.77 from 3.50 on 03/20/2019 Continue to avoid nephrotoxic agents/dehydration/hypotension 1.075 cc urine output recorded in the last 24 hours BMP a.m.  Chronic macrocytic anemia Hemoglobin appears stable 9.2 from 9.0 yesterday with MCV of 102 Obtain folate and B12 levels No sign of overt bleeding Continue to monitor CBC  Chronic thrombocytopenia, unclear etiology Platelets stable 102K No sign of mucosal bleed  Persistent hypovolemic hypernatremia Sodium plateaued at 150 Continue D5W at 125 cc/h as recommended by nephrology Continue close monitoring of sodium level  Elevated troponin suspect secondary to demand ischemia in the setting of acute illness Troponins peaked at  2.75 and trended down 2D echo done and unremarkable  per cardiology Denies chest pain  Diarrhea secondary to enteropathogenic E. coli C. difficile negative GI panel positive for enteropathogenic E. coli Continue to closely monitor for any sign of dehydration  Physical debility/ambulatory dysfunction Patient is from home PT assessed and recommended SNF CSW consulted for placement Fall precautions  Severe protein calorie malnutrition Albumin 1.5 with BMI of 28 Oral supplement Encourage increase oral protein calorie intake  Resolved hypotension Suspect secondary to volume loss severe dehydration with persistent diarrhea Blood pressure is improved and maintaining map greater than 65 Continue to closely monitor vital signs     Code Status: Full code  Family Communication: None at bedside this morning  Disposition Plan: SNF when bed is available or when nephrology signs off.   Consultants:  Nephrology  Cardiology signed off on 03/20/2019  Procedures:  2D echo  Antimicrobials:  Meropenem  DVT prophylaxis: Subcu heparin 3 times daily   Objective: Vitals:   03/21/19 1937 03/21/19 2335 03/22/19 0300 03/22/19 0720  BP: (!) 141/61 127/60 (!) 141/77   Pulse: 70 68 64   Resp: 18 (!) 29 (!) 32   Temp: 97.9 F (36.6 C) 97.8 F (36.6 C) 97.9 F (36.6 C)   TempSrc: Oral Oral Oral   SpO2: 96% 95% 92% 94%  Weight:      Height:        Intake/Output Summary (Last 24 hours) at 03/22/2019 0856 Last data filed at 03/22/2019 0458 Gross per 24 hour  Intake -  Output 1075 ml  Net -1075 ml   Filed Weights   03/19/19 0100 03/19/19 0556 03/21/19 0500  Weight: 87 kg 90 kg 90.6 kg    Exam:  . General: 85 y.o. year-old male well-developed well-nourished no acute distress.  Alert and interactive in the state of dementia.. . Cardiovascular: Regular rate and rhythm with no rubs or gallops.  JVD or thyromegaly . Respiratory: Mild rales at bases with no wheezes.  Poor inspiratory effort.  Abdomen: Soft nontender nondistended  with normal bowel sounds x4 quadrants. . Musculoskeletal: No lower extremity edema. 2/4 pulses in all 4 extremities. Marland Kitchen Psychiatry: Mood is appropriate for condition and setting   Data Reviewed: CBC: Recent Labs  Lab 03/17/19 1625 03/18/19 0217 03/19/19 0023 03/20/19 0558 03/21/19 0555  WBC 7.6 5.7 6.2 3.8* 3.0*  NEUTROABS 5.1  --  4.3  --   --   HGB 11.8* 11.5* 10.7* 9.0* 9.2*  HCT 40.4 39.1 34.0* 29.3* 30.2*  MCV 109.8* 109.8* 103.0* 101.4* 102.4*  PLT 191 164 142* 111* 324*   Basic Metabolic Panel: Recent Labs  Lab 03/19/19 0208 03/19/19 1440  03/20/19 0207  03/21/19 0928 03/21/19 1318 03/21/19 1905 03/21/19 2307 03/22/19 0357  NA 149* 153*   < > 150*   < > 150* 150* 149* 147* 150*  K 4.3 4.7   < > 3.7   < > 3.5 3.4* 3.2* 3.6 3.8  CL 119* 113*   < > 113*   < > 114* 113* 113* 115* 114*  CO2 19* 20*   < > 24   < > 27 28 26 26 27   GLUCOSE 67* 72   < > 74   < > 112* 131* 149* 130* 126*  BUN 112* 102*   < > 96*   < > 71* 69* 67* 65* 62*  CREATININE 4.85* 4.12*   < > 3.76*   < > 2.57* 2.46* 2.40* 2.38* 2.30*  CALCIUM  7.1* 7.8*   < > 7.4*   < > 7.7* 7.8* 7.7* 7.6* 7.7*  PHOS 5.2* 5.0*  --  4.4  --   --   --   --   --  2.5   < > = values in this interval not displayed.   GFR: Estimated Creatinine Clearance: 27.1 mL/min (A) (by C-G formula based on SCr of 2.3 mg/dL (H)). Liver Function Tests: Recent Labs  Lab 03/17/19 1625 03/19/19 0023 03/19/19 0208 03/19/19 1440 03/20/19 0207 03/22/19 0357  AST 61* 80*  --   --   --   --   ALT 36 32  --   --   --   --   ALKPHOS 68 64  --   --   --   --   BILITOT 0.5 0.7  --   --   --   --   PROT 7.1 6.1*  --   --   --   --   ALBUMIN 2.6* 2.0* 1.9* 1.5* 1.5* 1.6*   Recent Labs  Lab 03/17/19 1625  LIPASE 72*   No results for input(s): AMMONIA in the last 168 hours. Coagulation Profile: Recent Labs  Lab 03/19/19 0208  INR 1.3*   Cardiac Enzymes: Recent Labs  Lab 03/19/19 0023 03/19/19 0906 03/19/19 1459 03/19/19  2124  TROPONINI 2.75* QUANTITY NOT SUFFICIENT, UNABLE TO PERFORM TEST 0.86* 0.67*   BNP (last 3 results) No results for input(s): PROBNP in the last 8760 hours. HbA1C: No results for input(s): HGBA1C in the last 72 hours. CBG: No results for input(s): GLUCAP in the last 168 hours. Lipid Profile: No results for input(s): CHOL, HDL, LDLCALC, TRIG, CHOLHDL, LDLDIRECT in the last 72 hours. Thyroid Function Tests: No results for input(s): TSH, T4TOTAL, FREET4, T3FREE, THYROIDAB in the last 72 hours. Anemia Panel: No results for input(s): VITAMINB12, FOLATE, FERRITIN, TIBC, IRON, RETICCTPCT in the last 72 hours. Urine analysis:    Component Value Date/Time   COLORURINE YELLOW 03/18/2019 1500   APPEARANCEUR CLOUDY (A) 03/18/2019 1500   LABSPEC 1.012 03/18/2019 1500   PHURINE 5.0 03/18/2019 1500   GLUCOSEU NEGATIVE 03/18/2019 1500   GLUCOSEU NEGATIVE 12/30/2017 1443   HGBUR LARGE (A) 03/18/2019 1500   BILIRUBINUR NEGATIVE 03/18/2019 1500   KETONESUR NEGATIVE 03/18/2019 1500   PROTEINUR NEGATIVE 03/18/2019 1500   UROBILINOGEN 0.2 12/30/2017 1443   NITRITE NEGATIVE 03/18/2019 1500   LEUKOCYTESUR LARGE (A) 03/18/2019 1500   Sepsis Labs: @LABRCNTIP (procalcitonin:4,lacticidven:4)  ) Recent Results (from the past 240 hour(s))  Blood Culture (routine x 2)     Status: None   Collection Time: 03/17/19  4:25 PM  Result Value Ref Range Status   Specimen Description BLOOD RIGHT HAND  Final   Special Requests AEROBIC BOTTLE ONLY Blood Culture adequate volume  Final   Culture   Final    NO GROWTH 5 DAYS Performed at [facility], [address] [place], [place] [address]    Report Status 03/22/2019 FINAL  Final  Blood Culture (routine x 2)     Status: None   Collection Time: 03/17/19  9:12 PM  Result Value Ref Range Status   Specimen Description BLOOD RIGHT ANTECUBITAL  Final   Special Requests   Final    BOTTLES DRAWN AEROBIC AND ANAEROBIC Blood Culture adequate volume   Culture    Final    NO GROWTH 5 DAYS Performed at [facility], [address], [place], [place] [address]    Report Status 03/22/2019 FINAL  Final  Gastrointestinal Panel by PCR , Stool     Status: Abnormal   Collection Time: 03/18/19  9:00 AM  Result Value Ref Range Status   Campylobacter species NOT DETECTED NOT DETECTED Final   Plesimonas shigelloides NOT DETECTED NOT DETECTED Final   Salmonella species NOT DETECTED NOT DETECTED Final   Yersinia enterocolitica NOT DETECTED NOT DETECTED Final   Vibrio species NOT DETECTED NOT DETECTED Final   Vibrio cholerae NOT DETECTED NOT DETECTED Final   Enteroaggregative E coli (EAEC) NOT DETECTED NOT DETECTED Final   Enteropathogenic E coli (EPEC) DETECTED (A) NOT DETECTED Final    Comment: RESULT CALLED TO, READ BACK BY AND VERIFIED WITH:  AMINA MOUHAMED AT 1609 03/19/19 SDR    Enterotoxigenic E coli (ETEC) NOT DETECTED NOT DETECTED Final   Shiga like toxin producing E coli (STEC) NOT DETECTED NOT DETECTED Final   Shigella/Enteroinvasive E coli (EIEC) NOT DETECTED NOT DETECTED Final   Cryptosporidium NOT DETECTED NOT DETECTED Final   Cyclospora cayetanensis NOT DETECTED NOT DETECTED Final   Entamoeba histolytica NOT DETECTED NOT DETECTED Final   Giardia lamblia NOT DETECTED NOT DETECTED Final   Adenovirus F40/41 NOT DETECTED NOT DETECTED Final   Astrovirus NOT DETECTED NOT DETECTED Final   Norovirus GI/GII NOT DETECTED NOT DETECTED Final   Rotavirus A NOT DETECTED NOT DETECTED Final   Sapovirus (I, II, IV, and V) NOT DETECTED NOT DETECTED Final    Comment: Performed at [facility], [address] [place], [place] [address]  C Difficile Quick Screen w PCR reflex     Status: None   Collection Time: 03/18/19  9:00 AM  Result Value Ref Range Status   C Diff antigen NEGATIVE NEGATIVE Final   C Diff toxin NEGATIVE NEGATIVE Final   C Diff interpretation No C. difficile detected.  Final    Comment: Performed at [facility], [address] [place], [place] [address]      Studies: No results found.  Scheduled Meds: . aspirin EC  81 mg Oral Daily  . chlorhexidine  15 mL Mouth Rinse BID  . clopidogrel  75 mg Oral Daily  . feeding supplement (ENSURE ENLIVE)  237 mL Oral BID BM  . Gerhardt's butt cream   Topical BID  . heparin injection (subcutaneous)  5,000 Units Subcutaneous Q8H  . ipratropium  0.5 mg Nebulization BID  . levalbuterol  0.63 mg Nebulization BID  . levothyroxine  50 mcg Oral Daily  . mouth rinse  15 mL Mouth Rinse q12n4p    Continuous Infusions: . dextrose 125 mL/hr at 03/22/19 0246  . meropenem (MERREM) IV 1 g (03/22/19 0245)     LOS: 5 days     Kayleen Memos, MD [facility] Pager [phone]  If 7PM-7AM, please contact night-coverage [email or web address] Password [facility] 03/22/2019, 8:56 AM

## 2019-03-22 NOTE — Progress Notes (Signed)
Robert Barnett Progress Note    Assessment/ Plan:   85 year old BM with dementia, HTN. Now has suffered an NSTEMI, likely aspiration PNA, UTI and A on CRF  1. Acute kidney injury on chronic kidney disease stage IIIb/IV: I suspect that this is primarily hemodynamically mediated in the setting of recent diarrhea/volume depletion with ongoing benazepril use. urinalysis looks like UTI- culture pending and on meropenam. Imaging does not show hydro.  - Continue to avoid hypotension/iodinated intravenous contrast and nonsteroidal anti-inflammatory drugs.  - Fortunately his renal function continues to slowly improve -> will sign off at this time. Please reconsult as needed. 2. Hyperkalemia: resolved 3. Mixed anion gap with non-gap metabolic acidosis: Secondary to losses from diarrhea as well as acute kidney injury.better 4. Fever/hypotension: Ongoing evaluation for focus of infection-UTI/PNA. He is on meropenam- blood cultures negative. Is not overloaded- agree with IVF. ACE being held  5. Hypernatremia:Indicative of poor water intake/fluid intake possibly associated with his altered mental status-had been bolused with 1/2 NS3/2 and had also rx d5w + 3 amps HCO3. - Increase  d5w to 125ml/hr. Free water clearance is 474ml; therefore would cont the D5W @125ml /hr and adjust according to his PO input (access to free water) and BMET. 125-158ml/hr should be adequate for him until he starts drinking more consistently. 6. Anemia-not an issue   Subjective:   Denies f/c/n/v.  Denies dyspnea or CP.  More confused today though.   Objective:   BP (!) 141/77 (BP Location: Left Arm)   Pulse 64   Temp 97.9 F (36.6 C) (Oral)   Resp (!) 32   Ht 5\' 10"  (1.778 m)   Wt 90.6 kg   SpO2 94%   BMI 28.66 kg/m   Intake/Output Summary (Last 24 hours) at 03/22/2019 6546 Last data filed at 03/22/2019 0458 Gross per 24 hour  Intake -  Output 1075 ml  Net -1075 ml   Weight change:    Physical Exam: General:more confused today,  hard of hearing Heart: RRR Lungs: CBS bilat but still only [place] o2 Abdomen: soft, colocutaneous fistula Extremities: no edema GU: No cath  Imaging: No results found.  Labs: BMET Recent Labs  Lab 03/19/19 0208 03/19/19 1440  03/20/19 0207  03/21/19 0141 03/21/19 0555 03/21/19 0928 03/21/19 1318 03/21/19 1905 03/21/19 2307 03/22/19 0357  NA 149* 153*   < > 150*   < > 150* 151* 150* 150* 149* 147* 150*  K 4.3 4.7   < > 3.7   < > 3.5 3.3* 3.5 3.4* 3.2* 3.6 3.8  CL 119* 113*   < > 113*   < > 114* 115* 114* 113* 113* 115* 114*  CO2 19* 20*   < > 24   < > 28 27 27 28 26 26 27   GLUCOSE 67* 72   < > 74   < > 104* 107* 112* 131* 149* 130* 126*  BUN 112* 102*   < > 96*   < > 79* 74* 71* 69* 67* 65* 62*  CREATININE 4.85* 4.12*   < > 3.76*   < > 2.77* 2.68* 2.57* 2.46* 2.40* 2.38* 2.30*  CALCIUM 7.1* 7.8*   < > 7.4*   < > 7.5* 7.6* 7.7* 7.8* 7.7* 7.6* 7.7*  PHOS 5.2* 5.0*  --  4.4  --   --   --   --   --   --   --  2.5   < > = values in this interval not displayed.  CBC Recent Labs  Lab 03/17/19 1625 03/18/19 0217 03/19/19 0023 03/20/19 0558 03/21/19 0555  WBC 7.6 5.7 6.2 3.8* 3.0*  NEUTROABS 5.1  --  4.3  --   --   HGB 11.8* 11.5* 10.7* 9.0* 9.2*  HCT 40.4 39.1 34.0* 29.3* 30.2*  MCV 109.8* 109.8* 103.0* 101.4* 102.4*  PLT 191 164 142* 111* 102*    Medications:    . aspirin EC  81 mg Oral Daily  . chlorhexidine  15 mL Mouth Rinse BID  . clopidogrel  75 mg Oral Daily  . feeding supplement (ENSURE ENLIVE)  237 mL Oral BID BM  . Gerhardt's butt cream   Topical BID  . heparin injection (subcutaneous)  5,000 Units Subcutaneous Q8H  . ipratropium  0.5 mg Nebulization BID  . levalbuterol  0.63 mg Nebulization BID  . levothyroxine  50 mcg Oral Daily  . mouth rinse  15 mL Mouth Rinse q12n4p      Otelia Santee, MD 03/22/2019, 9:09 AM

## 2019-03-22 NOTE — TOC Progression Note (Addendum)
Transition of Care [facility]) - Progression Note    Patient Details  Name: Robert Barnett MRN: [id number] Date of Birth: Dec 26, 1932  Transition of Care [facility]) CM/SW [place], LCSW Phone Number: 03/22/2019, 9:50 AM  Clinical Narrative: Received voicemail from daughter [name] this morning. Family has decided to take patient home due to concerns with COVID-19 pandemic. She has requested a hospital bed, recliner, and hoyer lift. RNCM has contacted [facility] and they are reviewing chart to make sure patient meets criteria for this equipment. Left daughter a voicemail to let her know.    10:19 am: Received call back from [place]. She stated two of patient's children are retired so someone will be with him 24/7. Updated her on equipment status. Daughter is now agreeable to home health. She is fine with [facility] since [facility] is who he worked with last time he was set up. Verbally provided CMS Medicare star ratings. She wants all the services he will qualify for. CSW will fax orders to [facility] when available.   10:52 am: Made referral to [facility] with [facility]. She will have staff review referral and notify CSW of decision.  11:52 am: [facility] is able to accept patient for home health needs at discharge.  Expected Discharge Plan: [place] Barriers to Discharge: Continued Medical Work up, Ship broker  Expected Discharge Plan and Services Expected Discharge Plan: [place] In-house Referral: Hospice / [place] arrangements for the past 2 months: Single Family Home Expected Discharge Date: 03/20/19                         Social Determinants of Health (SDOH) Interventions    Readmission Risk Interventions No flowsheet data found.

## 2019-03-22 NOTE — Progress Notes (Signed)
Physical Therapy Treatment Patient Details Name: Robert Barnett MRN: [id number] DOB: [birth year] Today's Date: 03/22/2019    History of Present Illness Pt is an 85 year old man admitted with loose stools, generalized weakness and AMD. +NSTEMI and acute on chronic renal failure with dehydration. PMH: dementia, HTN, CKD IV, AAA, CVA, CAD. Palliative care is involved in pt's care.    PT Comments    Patient seen for further assessment of activity. At this time, patient remains total assist and unable to engage/participate in EOB or OOB therapies. Patient also expressing concerns that he "does not want to do this" despite max enouragement. Was agreeable to PROM activities and repositioning but patient so weak and unable to self assist. At this time, will continue to trial PT services but unlikely patient will make significant gains without desire to participate. If plan to return home, patient will need hoyer lift, hospital bed, and increased physical assist. Continue to feel that palliative assist may be most beneficial.   Follow Up Recommendations  SNF;Supervision/Assistance - 24 hour(vs home with palliative care)     Equipment Recommendations  Hospital bed(hoyer lift)    Recommendations for Other Services       Precautions / Restrictions Precautions Precautions: Fall Precaution Comments: drains in place left flank Restrictions Weight Bearing Restrictions: No    Mobility  Bed Mobility Overal bed mobility: Needs Assistance Bed Mobility: Rolling;Supine to Sit;Sit to Supine Rolling: Max assist   Supine to sit: Total assist;+2 for physical assistance;+2 for safety/equipment Sit to supine: Total assist;+2 for physical assistance;+2 for safety/equipment   General bed mobility comments: Total assist with use of mechanical features of bed to come to long sitting in bed  Transfers                 General transfer comment: unable to bring LEs off EOB  Ambulation/Gait             General Gait Details: unable   Stairs             Wheelchair Mobility    Modified Rankin (Stroke Patients Only)       Balance Overall balance assessment: Needs assistance Sitting-balance support: Feet supported Sitting balance-Leahy Scale: Poor Sitting balance - Comments: long sit with mechanical features of bed                                    Cognition Arousal/Alertness: Lethargic Behavior During Therapy: Flat affect Overall Cognitive Status: Impaired/Different from baseline Area of Impairment: Orientation;Attention;Memory;Safety/judgement;Problem solving                 Orientation Level: Disoriented to;Time;Situation;Place Current Attention Level: Sustained;Focused Memory: Decreased short-term memory   Safety/Judgement: Decreased awareness of safety;Decreased awareness of deficits   Problem Solving: Slow processing;Decreased initiation;Difficulty sequencing;Requires verbal cues;Requires tactile cues        Exercises      General Comments General comments (skin integrity, edema, etc.): PROM performed all extemeities      Pertinent Vitals/Pain Pain Assessment: Faces Faces Pain Scale: Hurts even more Pain Location: generalized Pain Descriptors / Indicators: Grimacing Pain Intervention(s): Monitored during session    Home Living                      Prior Function            PT Goals (current goals can now be found in the care  plan section) Acute Rehab PT Goals Patient Stated Goal: to go home PT Goal Formulation: With patient/family Time For Goal Achievement: 04/03/19 Potential to Achieve Goals: Poor Progress towards PT goals: Not progressing toward goals - comment    Frequency    Min 2X/week      PT Plan Equipment recommendations need to be updated    Co-evaluation              AM-PAC PT "6 Clicks" Mobility   Outcome Measure  Help needed turning from your back to your side while in a  flat bed without using bedrails?: Total Help needed moving from lying on your back to sitting on the side of a flat bed without using bedrails?: Total Help needed moving to and from a bed to a chair (including a wheelchair)?: Total Help needed standing up from a chair using your arms (e.g., wheelchair or bedside chair)?: Total Help needed to walk in hospital room?: Total Help needed climbing 3-5 steps with a railing? : Total 6 Click Score: 6    End of Session   Activity Tolerance: Patient limited by fatigue;Patient limited by lethargy Patient left: in bed;with call bell/phone within reach;with family/visitor present Nurse Communication: Mobility status PT Visit Diagnosis: Muscle weakness (generalized) (M62.81);Adult, failure to thrive (R62.7)     Time: 8184-0375 PT Time Calculation (min) (ACUTE ONLY): 17 min  Charges:  $Therapeutic Activity: 8-22 mins                     Alben Deeds, PT DPT  Board Certified Neurologic Specialist [place] Pager [phone] Office [place] 03/22/2019, 3:12 PM

## 2019-03-22 NOTE — Consult Note (Signed)
[facility] Nurse wound consult note Reason for Consult: sacrum, anal, scrotum Wound type:  Patient incontinent of urine, noted to have full thickness ulceration on the posterior scrotum, a fissure along the apex of the gluteal cleft, discoloration noted along the spine but not open.  Patient is confused but he can converse with [place].  Feel the wounds are related to moisture in the presence of urinary and bowel incontinence. Incontinence brief in place, removed by [facility] nurse.  Pressure Injury POA: NA Measurement:  Scrotum: 1.5cm x 1.0cm x 0.1cm; 100% pink, pale, rolled wound edge  Apex of gluteal cleft: 0.3cm x 0.1cm x 0.1cm; 100% pink, plae Wound bed: see above  Drainage (amount, consistency, odor) none Periwound:  Intact, skin discoloration noted in the gluteal cleft, rough in texture, feel this is chronic skin condition Dressing procedure/placement/frequency: Gerhardt's butt cream to affected areas.  No incontinence briefs to avoid further injury to the skin  Low air loss mattress in place for moisture management and pressure redistribution. Suggested fecal collector to be used for urinary incontinence for challenging male anatomy in which a condom cath will not work . Requested unit secretary to order lawson # [id number] (rectal pouch) for patient and explained use to the bedside nurse, once placed can hook to BSD.  Discussed POC with patient and bedside nurse.  Re consult if needed, will not follow at this time. Thanks  Ladarrius Bogdanski [employer], RN,CWOCN, CNS, [place] [phone])

## 2019-03-23 LAB — RENAL FUNCTION PANEL
Albumin: 1.7 g/dL — ABNORMAL LOW (ref 3.5–5.0)
Anion gap: 5 (ref 5–15)
BUN: 47 mg/dL — ABNORMAL HIGH (ref 8–23)
CO2: 27 mmol/L (ref 22–32)
Calcium: 7.6 mg/dL — ABNORMAL LOW (ref 8.9–10.3)
Chloride: 114 mmol/L — ABNORMAL HIGH (ref 98–111)
Creatinine, Ser: 1.8 mg/dL — ABNORMAL HIGH (ref 0.61–1.24)
GFR calc Af Amer: 39 mL/min — ABNORMAL LOW (ref >60)
GFR, EST NON AFRICAN AMERICAN: 34 mL/min — AB (ref >60)
Glucose, Bld: 101 mg/dL — ABNORMAL HIGH (ref 70–99)
Phosphorus: 2.3 mg/dL — ABNORMAL LOW (ref 2.5–4.6)
Potassium: 4.1 mmol/L (ref 3.5–5.1)
Sodium: 146 mmol/L — ABNORMAL HIGH (ref 135–145)

## 2019-03-23 MED ORDER — GERHARDT'S BUTT CREAM: 1.0000 "application " | TOPICAL_CREAM | Freq: Two times a day (BID) | CUTANEOUS | 0 refills | Status: AC

## 2019-03-23 MED ORDER — AMLODIPINE BESYLATE 5 MG PO TABS
5.0000 mg | ORAL_TABLET | Freq: Every day | ORAL | Status: DC
  Administered 2019-03-23 – 2019-03-24 (×2): 5 mg via ORAL
  Filled 2019-03-23 (×2): qty 1

## 2019-03-23 MED ORDER — AMLODIPINE BESYLATE 5 MG PO TABS: 5.0000 mg | ORAL_TABLET | Freq: Every day | ORAL | 0 refills | Status: DC

## 2019-03-23 MED ORDER — ENSURE ENLIVE PO LIQD: 237.0000 mL | Freq: Two times a day (BID) | ORAL | 0 refills | Status: AC

## 2019-03-23 MED ORDER — CHLORHEXIDINE GLUCONATE 0.12 % MT SOLN: 15.0000 mL | Freq: Two times a day (BID) | OROMUCOSAL | 0 refills | Status: DC

## 2019-03-23 NOTE — Care Management (Signed)
03/23/2019 Pt now has discharge order for home.  CM spoke with both [name] and Carlyon Shadow (daughters) .  Both pts son and daughter will provide 24 hour care.  [facility] scheduled to deliver equipment to pts home between 11-3pm.  CM request attending to contact family and provide clinical update along with post discharge care/needs.  Pt will need PTAR transport home at discharge   03/22/19 CM informed family would like to take pt home and are now declining SNF.  Pt family request hospital bed and hoyer life - orders written.  Medic[employer]gov list given for DME - [facility] chosen - referral given and accepted by [facility]. Attending is a aware of discharge plan change to home

## 2019-03-23 NOTE — Plan of Care (Signed)
  Problem: Clinical Measurements: Goal: Ability to maintain clinical measurements within normal limits will improve Outcome: Progressing Goal: Diagnostic test results will improve Outcome: Progressing Goal: Respiratory complications will improve Outcome: Progressing Goal: Cardiovascular complication will be avoided Outcome: Progressing   Problem: Coping: Goal: Level of anxiety will decrease Outcome: Progressing   

## 2019-03-23 NOTE — Progress Notes (Signed)
  Speech Language Pathology Treatment: Dysphagia  Patient Details Name: Robert Barnett MRN: [id number] DOB: 13-Aug-1932 Today's Date: 03/23/2019 Time: 4503-8882 SLP Time Calculation (min) (ACUTE ONLY): 9 min  Assessment / Plan / Recommendation Clinical Impression  Pt had recently had breakfast and an Ensure and therefore repeatedly said, "I don't want any of that" when offered various POs from SLP. He did consume several bites of magic cup and cup sips of thin liquids. Mod cues were provided for sustained attention, and oral preparation/transit remains prolonged with purees. He had baseline coughing prior to introducing POs, but not throughout PO intake. A delayed throat clear was noted x1, but per MBS, this was not correlated with airway compromise. Recommend continuing Dys 1 diet and thin liquids. Robert Barnett SLP recommended as plan for discharge is now home.    HPI HPI: 85 y.o. male with medical history significant for dementia, CKD4, AAA, BPH, hypothyrodism, who was bought to the Ed with c/o mutiple episodes of loose stool and generalized weakness over the past week.  Today patient was not responding communicating as he used to.  History is obtained from daughter who is at bedside.  Patient do awake and alert now with improvement in mental status, is not cooperating and is unable to give me a history.  Daughter denies abdominal pain, reports patient complained of pain in his rectum.        SLP Plan  Continue with current plan of care       Recommendations  Diet recommendations: Dysphagia 1 (puree);Thin liquid Liquids provided via: Cup;Teaspoon Medication Administration: Crushed with puree Supervision: Staff to assist with self feeding;Full supervision/cueing for compensatory strategies Compensations: Minimize environmental distractions;Slow rate;Small sips/bites;Lingual sweep for clearance of pocketing Postural Changes and/or Swallow Maneuvers: Seated upright 90 degrees                Oral Care Recommendations: Oral care BID Follow up Recommendations: Home health SLP;24 hour supervision/assistance SLP Visit Diagnosis: Dysphagia, oropharyngeal phase (R13.12) Plan: Continue with current plan of care       GO                Robert Barnett 03/23/2019, 11:15 AM  Robert Barnett, M.A. Robert Barnett Acute Environmental education officer [phone] Office [phone]

## 2019-03-23 NOTE — Discharge Instructions (Signed)
Diarrhea, Adult Diarrhea is when you pass loose and watery poop (stool) often. Diarrhea can make you feel weak and cause you to lose water in your body (get dehydrated). Losing water in your body can cause you to:  Feel tired and thirsty.  Have a dry mouth.  Go pee (urinate) less often. Diarrhea often lasts 2-3 days. However, it can last longer if it is a sign of something more serious. It is important to treat your diarrhea as told by your doctor. Follow these instructions at home: Eating and drinking     Follow these instructions as told by your doctor:  Take an ORS (oral rehydration solution). This is a drink that helps you replace fluids and minerals your body lost. It is sold at pharmacies and stores.  Drink plenty of fluids, such as: ? Water. ? Ice chips. ? Diluted fruit juice. ? Low-calorie sports drinks. ? Milk, if you want.  Avoid drinking fluids that have a lot of sugar or caffeine in them.  Eat bland, easy-to-digest foods in small amounts as you are able. These foods include: ? Bananas. ? Applesauce. ? Rice. ? Low-fat (lean) meats. ? Toast. ? Crackers.  Avoid alcohol.  Avoid spicy or fatty foods.  Medicines  Take over-the-counter and prescription medicines only as told by your doctor.  If you were prescribed an antibiotic medicine, take it as told by your doctor. Do not stop using the antibiotic even if you start to feel better. General instructions   Wash your hands often using soap and water. If soap and water are not available, use a hand sanitizer. Others in your home should wash their hands as well. Hands should be washed: ? After using the toilet or changing a diaper. ? Before preparing, cooking, or serving food. ? While caring for a sick person. ? While visiting someone in a hospital.  Drink enough fluid to keep your pee (urine) pale yellow.  Rest at home while you get better.  Watch your condition for any changes.  Take a warm bath to help  with any burning or pain from having diarrhea.  Keep all follow-up visits as told by your doctor. This is important. Contact a doctor if:  You have a fever.  Your diarrhea gets worse.  You have new symptoms.  You cannot keep fluids down.  You feel light-headed or dizzy.  You have a headache.  You have muscle cramps. Get help right away if:  You have chest pain.  You feel very weak or you pass out (faint).  You have bloody or black poop or poop that looks like tar.  You have very bad pain, cramping, or bloating in your belly (abdomen).  You have trouble breathing or you are breathing very quickly.  Your heart is beating very quickly.  Your skin feels cold and clammy.  You feel confused.  You have signs of losing too much water in your body, such as: ? Dark pee, very little pee, or no pee. ? Cracked lips. ? Dry mouth. ? Sunken eyes. ? Sleepiness. ? Weakness. Summary  Diarrhea is when you pass loose and watery poop (stool) often.  Diarrhea can make you feel weak and cause you to lose water in your body (get dehydrated).  Take an ORS (oral rehydration solution). This is a drink that is sold at pharmacies and stores.  Eat bland, easy-to-digest foods in small amounts as you are able.  Contact a doctor if your condition gets worse. Get help right  away if you have signs that you have lost too much water in your body. This information is not intended to replace advice given to you by your health care provider. Make sure you discuss any questions you have with your health care provider. Document Released: 06/02/2008 Document Revised: 05/21/2018 Document Reviewed: 05/21/2018 Elsevier Interactive Patient Education  2019 [place] Choices to Help Relieve Diarrhea, Adult When you have diarrhea, the foods you eat and your eating habits are very important. Choosing the right foods and drinks can help:  Relieve diarrhea.  Replace lost fluids and  nutrients.  Prevent dehydration. What general guidelines should I follow?  Relieving diarrhea  Choose foods with less than 2 g or .07 oz. of fiber per serving.  Limit fats to less than 8 tsp (38 g or 1.34 oz.) a day.  Avoid the following: ? Foods and beverages sweetened with high-fructose corn syrup, honey, or sugar alcohols such as xylitol, sorbitol, and mannitol. ? Foods that contain a lot of fat or sugar. ? Fried, greasy, or spicy foods. ? High-fiber grains, breads, and cereals. ? Raw fruits and vegetables.  Eat foods that are rich in probiotics. These foods include dairy products such as yogurt and fermented milk products. They help increase healthy bacteria in the stomach and intestines (gastrointestinal tract, or GI tract).  If you have lactose intolerance, avoid dairy products. These may make your diarrhea worse.  Take medicine to help stop diarrhea (antidiarrheal medicine) only as told by your health care provider. Replacing nutrients  Eat small meals or snacks every 3-4 hours.  Eat bland foods, such as white rice, toast, or baked potato, until your diarrhea starts to get better. Gradually reintroduce nutrient-rich foods as tolerated or as told by your health care provider. This includes: ? Well-cooked protein foods. ? Peeled, seeded, and soft-cooked fruits and vegetables. ? Low-fat dairy products.  Take vitamin and mineral supplements as told by your health care provider. Preventing dehydration  Start by sipping water or a special solution to prevent dehydration (oral rehydration solution, ORS). Urine that is clear or pale yellow means that you are getting enough fluid.  Try to drink at least 8-10 cups of fluid each day to help replace lost fluids.  You may add other liquids in addition to water, such as clear juice or decaffeinated sports drinks, as tolerated or as told by your health care provider.  Avoid drinks with caffeine, such as coffee, tea, or soft  drinks.  Avoid alcohol. What foods are recommended?     The items listed may not be a complete list. Talk with your health care provider about what dietary choices are best for you. Grains White rice. White, [place], or pita breads (fresh or toasted), including plain rolls, buns, or bagels. White pasta. Saltine, soda, or graham crackers. Pretzels. Low-fiber cereal. Cooked cereals made with water (such as cornmeal, farina, or cream cereals). Plain muffins. Matzo. Melba toast. Zwieback. Vegetables Potatoes (without the skin). Most well-cooked and canned vegetables without skins or seeds. Tender lettuce. Fruits Apple sauce. Fruits canned in juice. Cooked apricots, cherries, grapefruit, peaches, pears, or plums. Fresh bananas and cantaloupe. Meats and other protein foods Baked or boiled chicken. Eggs. Tofu. Fish. Seafood. Smooth nut butters. Ground or well-cooked tender beef, ham, veal, lamb, pork, or poultry. Dairy Plain yogurt, kefir, and unsweetened liquid yogurt. Lactose-free milk, buttermilk, skim milk, or soy milk. Low-fat or nonfat hard cheese. Beverages Water. Low-calorie sports drinks. Fruit juices without pulp. Strained tomato  and vegetable juices. Decaffeinated teas. Sugar-free beverages not sweetened with sugar alcohols. Oral rehydration solutions, if approved by your health care provider. Seasoning and other foods Bouillon, broth, or soups made from recommended foods. What foods are not recommended? The items listed may not be a complete list. Talk with your health care provider about what dietary choices are best for you. Grains Whole grain, whole wheat, bran, or rye breads, rolls, pastas, and crackers. Wild or brown rice. Whole grain or bran cereals. Barley. Oats and oatmeal. Corn tortillas or taco shells. Granola. Popcorn. Vegetables Raw vegetables. Fried vegetables. Cabbage, broccoli, Brussels sprouts, artichokes, baked beans, beet greens, corn, kale, legumes, peas, sweet  potatoes, and yams. Potato skins. Cooked spinach and cabbage. Fruits Dried fruit, including raisins and dates. Raw fruits. Stewed or dried prunes. Canned fruits with syrup. Meat and other protein foods Fried or fatty meats. Deli meats. Chunky nut butters. Nuts and seeds. Beans and lentils. Berniece Salines. Hot dogs. Sausage. Dairy High-fat cheeses. Whole milk, chocolate milk, and beverages made with milk, such as milk shakes. Half-and-half. Cream. sour cream. Ice cream. Beverages Caffeinated beverages (such as coffee, tea, soda, or energy drinks). Alcoholic beverages. Fruit juices with pulp. Prune juice. Soft drinks sweetened with high-fructose corn syrup or sugar alcohols. High-calorie sports drinks. Fats and oils Butter. Cream sauces. Margarine. Salad oils. Plain salad dressings. Olives. Avocados. Mayonnaise. Sweets and desserts Sweet rolls, doughnuts, and sweet breads. Sugar-free desserts sweetened with sugar alcohols such as xylitol and sorbitol. Seasoning and other foods Honey. Hot sauce. Chili powder. Gravy. Cream-based or milk-based soups. Pancakes and waffles. Summary  When you have diarrhea, the foods you eat and your eating habits are very important.  Make sure you get at least 8-10 cups of fluid each day, or enough to keep your urine clear or pale yellow.  Eat bland foods and gradually reintroduce healthy, nutrient-rich foods as tolerated, or as told by your health care provider.  Avoid high-fiber, fried, greasy, or spicy foods. This information is not intended to replace advice given to you by your health care provider. Make sure you discuss any questions you have with your health care provider. Document Released: 03/06/2004 Document Revised: 12/12/2016 Document Reviewed: 12/12/2016 Elsevier Interactive Patient Education  2019 [place].   Probiotics Probiotics are the good bacteria and yeasts that live in your body and keep your digestive system healthy. Probiotics also help your  body's defense system (immune system) and protect your body against the growth of harmful bacteria. Your health care provider may recommend taking a probiotic if you are taking antibiotics or have certain medical conditions, such as:  Diarrhea.  Constipation.  Irritable bowel syndrome.  Lung infections.  Yeast infections.  Acne, eczema, and other skin conditions.  Frequent urinary tract infections. What affects the balance of bacteria in my body? The balance of good bacteria in your body can be affected by:  Antibiotic medicines. These medicines treat infections caused by bacteria. Unfortunately, they may kill the good bacteria in your body as well as the bad bacteria.  Certain medical conditions. Conditions related to an imbalance of bacteria include: ? Stomach and intestine (gastrointestinal) infections. ? Lung infections. ? Skin infections. ? Vaginal infections. ? Inflammatory bowel diseases. ? Stomach ulcers (gastric ulcers). ? Tooth decay and gum disease (periodontal disease).  Stress.  Poor diet. What type of probiotic is right for me? Probiotics contain different types of bacteria (strains). Strains commonly found in probiotics include:  Lactobacillus.  Saccharomyces.  Bifidobacterium. Specific strains have been  shown to be more effective for certain health conditions. Ask your health care provider which strain or strains you should use and how often. Probiotics come in many different forms, strain combinations, and strengths. Some may need to be refrigerated. Always read the label for storage and usage instructions. Certain foods, such as yogurt, contain probiotics. Probiotics can also be bought as a supplement at a pharmacy, health food store, or grocery store. Talk to your health care provider before starting any supplement. What are the side effects of probiotics? Some people have side effects when taking probiotics. Side effects are usually temporary and may  include:  Gas.  Bloating.  Cramping. Serious side effects are rare. Follow these instructions at home:   If you are taking probiotics with antibiotics: ? Wait at least 2 hours between taking your medicine and the probiotic. ? Eat foods high in fiber, such as whole grains, beans, and vegetables. These foods can help good bacteria grow. ? Avoid certain foods as told by your health care provider. Summary  Probiotics are the good bacteria and yeasts that live in your body and keep you and your digestive system healthy.  Certain foods, such as yogurt, contain probiotics.  Probiotics can be taken as supplements. They can be bought at a pharmacy, health food store, or grocery store. They come in many different forms, strain combinations, and strengths.  Be sure to talk with your health care provider before taking a probiotic supplement. This information is not intended to replace advice given to you by your health care provider. Make sure you discuss any questions you have with your health care provider. Document Released: 07/12/2014 Document Revised: 09/03/2018 Document Reviewed: 12/30/2017 Elsevier Interactive Patient Education  2019 [employer]   Acute Kidney Injury, Adult  Acute kidney injury is a sudden worsening of kidney function. The kidneys are organs that have several jobs. They filter the blood to remove waste products and extra fluid. They also maintain a healthy balance of minerals and hormones in the body, which helps control blood pressure and keep bones strong. With this condition, your kidneys do not do their jobs as well as they should. This condition ranges from mild to severe. Over time it may develop into long-lasting (chronic) kidney disease. Early detection and treatment may prevent acute kidney injury from developing into a chronic condition. What are the causes? Common causes of this condition include:  A problem with blood flow to the kidneys. This may be  caused by: ? Low blood pressure (hypotension) or shock. ? Blood loss. ? Heart and blood vessel (cardiovascular) disease. ? Severe burns. ? Liver disease.  Direct damage to the kidneys. This may be caused by: ? Certain medicines. ? A kidney infection. ? Poisoning. ? Being around or in contact with toxic substances. ? A surgical wound. ? A hard, direct hit to the kidney area.  A sudden blockage of urine flow. This may be caused by: ? Cancer. ? Kidney stones. ? An enlarged prostate in males. What are the signs or symptoms? Symptoms of this condition may not be obvious until the condition becomes severe. Symptoms of this condition can include:  Tiredness (lethargy), or difficulty staying awake.  Nausea or vomiting.  Swelling (edema) of the face, legs, ankles, or feet.  Problems with urination, such as: ? Abdominal pain, or pain along the side of your stomach (flank). ? Decreased urine production. ? Decrease in the force of urine flow.  Muscle twitches and cramps, especially in the legs.  Confusion or trouble concentrating.  Loss of appetite.  Fever. How is this diagnosed? This condition may be diagnosed with tests, including:  Blood tests.  Urine tests.  Imaging tests.  A test in which a sample of tissue is removed from the kidneys to be examined under a microscope (kidney biopsy). How is this treated? Treatment for this condition depends on the cause and how severe the condition is. In mild cases, treatment may not be needed. The kidneys may heal on their own. In more severe cases, treatment will involve:  Treating the cause of the kidney injury. This may involve changing any medicines you are taking or adjusting your dosage.  Fluids. You may need specialized IV fluids to balance your body's needs.  Having a catheter placed to drain urine and prevent blockages.  Preventing problems from occurring. This may mean avoiding certain medicines or procedures that can  cause further injury to the kidneys. In some cases treatment may also require:  A procedure to remove toxic wastes from the body (dialysis or continuous renal replacement therapy - CRRT).  Surgery. This may be done to repair a torn kidney, or to remove the blockage from the urinary system. Follow these instructions at home: Medicines  Take over-the-counter and prescription medicines only as told by your health care provider.  Do not take any new medicines without your health care provider's approval. Many medicines can worsen your kidney damage.  Do not take any vitamin and mineral supplements without your health care provider's approval. Many nutritional supplements can worsen your kidney damage. Lifestyle  If your health care provider prescribed changes to your diet, follow them. You may need to decrease the amount of protein you eat.  Achieve and maintain a healthy weight. If you need help with this, ask your health care provider.  Start or continue an exercise plan. Try to exercise at least 30 minutes a day, 5 days a week.  Do not use any tobacco products, such as cigarettes, chewing tobacco, and e-cigarettes. If you need help quitting, ask your health care provider. General instructions  Keep track of your blood pressure. Report changes in your blood pressure as told by your health care provider.  Stay up to date with immunizations. Ask your health care provider which immunizations you need.  Keep all follow-up visits as told by your health care provider. This is important. Where to find more information  American Association of Kidney Patients: BombTimer.gl  National Kidney Foundation: www.kidney.[place]: https://mathis.com/  [facility]: ? www.lifeoptions.org ? www.kidneyschool.org Contact a health care provider if:  Your symptoms get worse.  You develop new symptoms. Get help right away if:  You develop symptoms of worsening  kidney disease, which include: ? Headaches. ? Abnormally dark or light skin. ? Easy bruising. ? Frequent hiccups. ? Chest pain. ? Shortness of breath. ? End of menstruation in women. ? Seizures. ? Confusion or altered mental status. ? Abdominal or back pain. ? Itchiness.  You have a fever.  Your body is producing less urine.  You have pain or bleeding when you urinate. Summary  Acute kidney injury is a sudden worsening of kidney function.  Acute kidney injury can be caused by problems with blood flow to the kidneys, direct damage to the kidneys, and sudden blockage of urine flow.  Symptoms of this condition may not be obvious until it becomes severe. Symptoms may include edema, lethargy, confusion, nausea or vomiting, and problems passing urine.  This  condition can usually be diagnosed with blood tests, urine tests, and imaging tests. Sometimes a kidney biopsy is done to diagnose this condition.  Treatment for this condition often involves treating the underlying cause. It is treated with fluids, medicines, dialysis, diet changes, or surgery. This information is not intended to replace advice given to you by your health care provider. Make sure you discuss any questions you have with your health care provider. Document Released: 06/30/2011 Document Revised: 04/16/2017 Document Reviewed: 12/05/2016 Elsevier Interactive Patient Education  2019 [employer].

## 2019-03-23 NOTE — Discharge Summary (Addendum)
Discharge Summary  Robert Barnett ZMO:[id number] DOB: 1932-06-21  PCP: Janith Lima, MD  Admit date: 03/17/2019 Discharge date: 03/23/2019  Time spent: 35 minutes  UPDATE: DC DELAYED DUE TO FAMILY NOT HAVING ALL THE EQUIPMENTS SET UP AT HOME TO RECEIVE PATIENT.   Spoke with the patient's daughter Robert Barnett on 03/23/19 who states patient does not have his set up ready to return home today. She is hoping everything will be delivered by this evening. Plan to DC home tomorrow 03/24/19.    Recommendations for Outpatient Follow-up:  1. Follow-up with your PCP 2. Continue physical therapy 3. Take your medications as prescribed 4. Fall precautions  Discharge Diagnoses:  Active Hospital Problems   Diagnosis Date Noted   Goals of care, counseling/discussion    Palliative care encounter    CAD (coronary artery disease), native coronary artery 03/19/2019   Mental status alteration 03/19/2019   Fever 03/19/2019   Diarrhea 03/19/2019   Elevated troponin 03/19/2019   AKI (acute kidney injury) ([place]) 03/17/2019   History of CVA (cerebrovascular accident) 06/14/2018   CKD (chronic kidney disease) stage 4, GFR 15-29 ml/min ([place]) 09/03/2017   Colovesical fistula 09/03/2017   Essential hypertension 07/30/2017   Moderate dementia without behavioral disturbance ([place]) 07/23/2016    Resolved Hospital Problems  No resolved problems to display.    Discharge Condition: Stable  Diet recommendation: Dysphagia 1 pure diet with thin liquids; meds crushed with pure as recommended by speech therapist.  Vitals:   03/23/19 0733 03/23/19 0747  BP:  135/79  Pulse:  60  Resp:  20  Temp:  97.6 F (36.4 C)  SpO2: 92% 95%    History of present illness:  Robert Apollo Herbinis a 85 y.o.male from home with medical history significant fordementia, CKD4, AAA, BPH, hypothyrodism,whowas bought to the Ed with c/o mutiple episodes of loose stool and generalized weakness over the  past week. Today patient was not responding or communicatingas heused to.History is obtained from daughter who is at bedside. Patient awake and alert now with improvement in mental status,is not cooperating andis unable to give a history.Daughter denies abdominal pain, reports patient complained of pain in his rectum.  No difficulty breathing normal, no cough, no fever or chills, no sick contacts, no recent travels.  At baseline patient has dementia, most times herecognizes close family members, answers simple questions, but needs assistance with most ADLs.  OnEMS arrival blood pressure60/40.    Given IV fluid with improvement of his blood pressure.  Was also significantly dehydrated.  Elevated troponin which trended down with suspicion for demand ischemia from acute illness.  2D echo unremarkable.  Cardiology consulted and signed off.  Significant AKI and hypovolemic hypernatremia, suspect prerenal secondary to severe dehydration.  Nephrology contacted and followed.  Signed off on 03/22/2019  Hospital course complicated by somnolence, lethargy, improved after IV fluid hydration.  03/23/19: Patient seen and examined at his bedside.  No acute events overnight.  He has no new complaints.  He is more alert today and interactive.  O2 saturation 100% on room air.  Vital signs and labs reviewed and are stable.    PT assessed and recommended SNF.  Family declines SNF and wants patient to come home.  Case manager consulted to assist with home health services.   On the day of discharge, the patient was hemodynamically stable.  He will need to follow-up with his primary care provider and continue physical therapy.  Fall precautions.      Hospital Course:  Active Problems:   Moderate dementia without behavioral disturbance (HCC)   Essential hypertension   Colovesical fistula   CKD (chronic kidney disease) stage 4, GFR 15-29 ml/min (HCC)   History of CVA (cerebrovascular  accident)   AKI (acute kidney injury) ([place])   CAD (coronary artery disease), native coronary artery   Mental status alteration   Fever   Diarrhea   Elevated troponin   Goals of care, counseling/discussion   Palliative care encounter   Resolving acute metabolic encephalopathy in the setting of dementia suspect secondary to severe dehydration versus aspiration pneumonia versus others Appears to be back to his baseline. He is alert and interactive Vital signs and labs reviewed and stable Completed IV fluid hydration and IV antibiotics  Resolving aspiration pneumonia Presented with reported fever 102.9, hypoxia, dysphagia, chest x-ray suggestive of right middle lobe pneumonia with suspicion for aspiration Speech therapist consulted and followed with recommendations for dysphagia 1 pure with thin liquids and meds crushed with pure. Completed 5 days of IV meropenem  Dysphagia Speech therapist followed with recommendations as stated above Aspiration precautions in place Feeding assistance  Improving AKI on CKD 3 suspect prerenal secondary to dehydration Presented with creatinine of 4.12 Creatinine continues to improve  with IV fluid hydration 1.80 from 2.30 from 2.77 from 3.50 on 03/20/2019 Continue to avoid dehydration Encourage oral fluid intake  Chronic macrocytic anemia Hemoglobin stable at 9.2 No sign of overt bleeding Follow-up with your primary care provider  Chronic thrombocytopenia, unclear etiology Platelets stable 102K No sign of mucosal bleed  Resolving hypovolemic hypernatremia Presented with sodium level 151 and dry mucous membranes Sodium on 03/23/2019 was 146 Completed D5W at 125 cc/h of IV fluid infusion Avoid dehydration Encourage frequent oral fluid intake Follow-up with your PCP and repeat labs  Elevated troponin suspect secondary to demand ischemia in the setting of acute illness Troponins peaked at 2.75 and trended down 2D echo done and  unremarkable per cardiology Denies chest pain  Diarrhea secondary to enteropathogenic E. coli C. difficile negative GI panel positive for enteropathogenic E. coli Avoid dehydration Feeding as tolerated  Physical debility/ambulatory dysfunction Patient is from home PT assessed and recommended SNF Family declines SNF We will discharge home with home health services Fall precautions  Severe protein calorie malnutrition Albumin 1.5 with BMI of 28 Continue to encourage increasing oral protein calorie intake Avoid aspiration  Resolved hypotension Suspect secondary to volume loss severe dehydration with persistent diarrhea Blood pressure currently normotensive Follow-up with your PCP     Code Status: Full code   Consultants:  Nephrology  Cardiology signed off on 03/20/2019  Procedures:  2D echo  Antimicrobials:  Meropenem  DVT prophylaxis: Subcu heparin 3 times daily    Discharge Exam: BP 135/79 (BP Location: Right Arm)    Pulse 60    Temp 97.6 F (36.4 C) (Axillary)    Resp 20    Ht 5\' 10"  (1.778 m)    Wt 92.5 kg    SpO2 95%    BMI 29.27 kg/m   General: 85 y.o. year-old male well developed well nourished in no acute distress.  Alert and oriented x3.  Cardiovascular: Regular rate and rhythm with no rubs or gallops.  No thyromegaly or JVD noted.    Respiratory: Clear to auscultation with no wheezes or rales. Good inspiratory effort.  Abdomen: Soft nontender nondistended with normal bowel sounds x4 quadrants.  Musculoskeletal: No lower extremity edema. 2/4 pulses in all 4 extremities.  Skin: No ulcerative lesions noted  or rashes,  Psychiatry: Mood is appropriate for condition and setting  Discharge Instructions You were cared for by a hospitalist during your hospital stay. If you have any questions about your discharge medications or the care you received while you were in the hospital after you are discharged, you can call the unit and asked  to speak with the hospitalist on call if the hospitalist that took care of you is not available. Once you are discharged, your primary care physician will handle any further medical issues. Please note that NO REFILLS for any discharge medications will be authorized once you are discharged, as it is imperative that you return to your primary care physician (or establish a relationship with a primary care physician if you do not have one) for your aftercare needs so that they can reassess your need for medications and monitor your lab values.   Allergies as of 03/23/2019   No Known Allergies     Medication List    STOP taking these medications   benazepril 40 MG tablet Commonly known as:  LOTENSIN   Colchicine 0.6 MG Caps   dronabinol 2.5 MG capsule Commonly known as:  Marinol     TAKE these medications   amLODipine 5 MG tablet Commonly known as:  NORVASC Take 1 tablet (5 mg total) by mouth daily.   aspirin 81 MG tablet Take 1 tablet (81 mg total) by mouth daily.   chlorhexidine 0.12 % solution Commonly known as:  PERIDEX 15 mLs by Mouth Rinse route 2 (two) times daily.   clopidogrel 75 MG tablet Commonly known as:  PLAVIX TAKE 1 TABLET BY MOUTH EVERY DAY   donepezil 10 MG tablet Commonly known as:  ARICEPT Take 1 tablet by mouth at bedtime.   feeding supplement (ENSURE ENLIVE) Liqd Take 237 mLs by mouth 2 (two) times daily between meals.   ferrous sulfate 325 (65 FE) MG EC tablet TAKE 1 TABLET BY MOUTH EVERY DAY WITH BREAKFAST   folic acid 1 MG tablet Commonly known as:  FOLVITE TAKE 1 TABLET BY MOUTH EVERY DAY   Gerhardt's butt cream Crea Apply 1 application topically 2 (two) times daily.   levothyroxine 50 MCG tablet Commonly known as:  SYNTHROID, LEVOTHROID TAKE 1 TABLET (50 MCG TOTAL) BY MOUTH DAILY.   simvastatin 20 MG tablet Commonly known as:  ZOCOR Take 1 tablet (20 mg total) by mouth daily.   thiamine 100 MG tablet TAKE 1 TABLET BY MOUTH EVERY  DAY   triamcinolone cream 0.5 % Commonly known as:  KENALOG Apply 1 application topically 2 (two) times daily.            Durable Medical Equipment  (From admission, onward)         Start     Ordered   03/22/19 1044  For home use only DME Hospital bed  Once    Comments:  Dementia  Question Answer Comment  Patient has (list medical condition): dementia   The above medical condition requires: Patient requires the ability to reposition frequently   Head must be elevated greater than: 30 degrees   Bed type Semi-electric   Hoyer Lift Yes   Support Surface: Gel Overlay      03/22/19 1044         No Known Allergies Follow-up Information    [facility].   Specialty:  Home Health Services Why:  They will follow up with you for home health needs.  Janith Lima, MD. Call in 1 day(s).   Specialty:  Internal Medicine Why:  Please call for a post hospital follow-up appointment Contact information: [address] [place] [address] [phone]            The results of significant diagnostics from this hospitalization (including imaging, microbiology, ancillary and laboratory) are listed below for reference.    Significant Diagnostic Studies: Ct Abdomen Pelvis Wo Contrast  Result Date: 03/18/2019 CLINICAL DATA:  85 year old male with history of dementia. Generalized weakness over the past week. EXAM: CT ABDOMEN AND PELVIS WITHOUT CONTRAST TECHNIQUE: Multidetector CT imaging of the abdomen and pelvis was performed following the standard protocol without IV contrast. COMPARISON:  CT the abdomen and pelvis 05/27/2018. FINDINGS: Lower chest: Heart size is normal. There is no significant pericardial fluid, thickening or pericardial calcification. There is aortic atherosclerosis, as well as atherosclerosis of the great vessels of the mediastinum and the coronary arteries, including calcified atherosclerotic plaque in the  left main, left anterior descending, left circumflex and right coronary arteries. Calcifications of the aortic valve. Bibasilar areas of volume loss and architectural distortion with some peribronchovascular ground-glass attenuation, which may reflect sequela of mild aspiration. Hepatobiliary: No definite suspicious cystic or solid hepatic lesions are confidently identified on today's noncontrast CT examination. Multiple small calcified gallstones lying dependently in the gallbladder. No surrounding inflammatory changes to suggest an acute cholecystitis at this time. Pancreas: No definite pancreatic mass or peripancreatic fluid or inflammatory changes noted on today's noncontrast CT examination. Spleen: Unremarkable. Adrenals/Urinary Tract: Low-attenuation lesions in both kidneys, incompletely characterized on today's noncontrast CT examination, but statistically likely to represent cysts, measuring up to 3.7 cm in the upper pole of the right kidney. Bilateral adrenal glands are normal in appearance. No hydroureteronephrosis. Stomach/Bowel: Normal appearance of the stomach. No pathologic dilatation of small bowel or colon. Numerous colonic diverticulae are noted, without surrounding inflammatory changes to suggest an acute diverticulitis at this time. Normal appendix. Adjacent to the mid sigmoid colon there is again a pigtail drainage catheter which is immediately anterior to the superior aspect of the urinary bladder and immediately deep to the anterior abdominal wall musculature. This is within a completely collapsed cavity. Vascular/Lymphatic: Aortic atherosclerosis. Aneurysmal dilatation of the common iliac arteries bilaterally measuring 3.1 cm on the right and 2.3 cm on the left. No lymphadenopathy noted in the abdomen or pelvis. Reproductive: Prostate gland and seminal vesicles are unremarkable in appearance. Other: No significant volume of ascites.  No pneumoperitoneum. Musculoskeletal: There are no  aggressive appearing lytic or blastic lesions noted in the visualized portions of the skeleton. IMPRESSION: 1. There are no definite acute findings noted in the abdomen or pelvis to account for the patient's symptoms. 2. Dependent changes in the lung bases bilaterally concerning for sequela of mild aspiration. 3. Indwelling pigtail drainage catheter in the low anterior anatomic pelvis for chronic colocutaneous fistula redemonstrated. This is within a completely collapsed collection. No new abscess identified. 4. Colonic diverticulosis without evidence of acute diverticulitis at this time. 5. Aortic atherosclerosis, in addition to left main and 3 vessel coronary artery disease. 6. There are calcifications of the aortic valve. Echocardiographic correlation for evaluation of potential valvular dysfunction may be warranted if clinically indicated. Electronically Signed   By: Vinnie Langton M.D.   On: 03/18/2019 09:15   Dg Chest Port 1 View  Result Date: 03/19/2019 CLINICAL DATA:  Dyspnea, multiple episodes of loose stools, hypothyroidism, dementia, chronic kidney disease EXAM: PORTABLE CHEST  1 VIEW COMPARISON:  Portable exam 0020 hours compared to 03/17/2019 FINDINGS: Normal heart size mediastinal contours. BILATERAL lower lobe infiltrates greater on RIGHT consistent with pneumonia. Upper lungs clear. No pleural effusion or pneumothorax. Osseous structures unremarkable. IMPRESSION: Bibasilar infiltrates consistent with pneumonia, greater on RIGHT. Electronically Signed   By: Lavonia Dana M.D.   On: 03/19/2019 00:35   Dg Chest Port 1 View  Result Date: 03/17/2019 CLINICAL DATA:  Diarrhea. EXAM: PORTABLE CHEST 1 VIEW COMPARISON:  No recent prior. FINDINGS: Stable cardiomegaly with normal pulmonary vascularity. Low lung volumes with mild basilar atelectasis. No pleural effusion or pneumothorax. Interposition of the colon under the right hemidiaphragm. IMPRESSION: Stable cardiomegaly with normal pulmonary  vascularity. Low lung volumes with mild basilar atelectasis. No focal infiltrate. Electronically Signed   By: Marcello Moores  Register   On: 03/17/2019 15:40   Dg Swallowing Func-speech Pathology  Result Date: 03/22/2019 Objective Swallowing Evaluation: Type of Study: MBS-Modified Barium Swallow Study  Patient Details Name: Dyshon Philbin MRN: [id number] Date of Birth: 1932/03/25 Today's Date: 03/22/2019 Time: SLP Start Time (ACUTE ONLY): 1131 -SLP Stop Time (ACUTE ONLY): 1155 SLP Time Calculation (min) (ACUTE ONLY): 24 min Past Medical History: Past Medical History: Diagnosis Date  Arthritis   BPH (benign prostatic hyperplasia)   Carotid artery occlusion   Dementia (HCC)   Dysphagia   Hyperlipidemia   Hypertension   Peripheral vascular disease ([place])   Stroke ([place])   Right hemispheric CVA  Umbilical hernia  Past Surgical History: Past Surgical History: Procedure Laterality Date  CAROTID ENDARTERECTOMY  10/06/2007  Right CEA by Dr. Amedeo Plenty  [place]  09/15/2017  IR CATHETER TUBE CHANGE  12/03/2017  IR CATHETER TUBE CHANGE  12/23/2017  IR CATHETER TUBE CHANGE  05/07/2018  IR CATHETER TUBE CHANGE  06/29/2018  IR CATHETER TUBE CHANGE  08/18/2018  IR CATHETER TUBE CHANGE  09/08/2018  IR CATHETER TUBE CHANGE  10/15/2018  IR CATHETER TUBE CHANGE  01/26/2019  IR GENERIC HISTORICAL  07/09/2016  IR RADIOLOGIST EVAL & MGMT 07/09/2016 Arne Cleveland, MD [facility] INTERV RAD  IR RADIOLOGIST EVAL & MGMT  09/15/2017  IR RADIOLOGIST EVAL & MGMT  05/06/2018  PEG TUBE PLACEMENT  2008  after CVA HPI: 85 y.o. male with medical history significant for dementia, CKD4, AAA, BPH, hypothyrodism, who was bought to the Ed with c/o mutiple episodes of loose stool and generalized weakness over the past week.  Today patient was not responding communicating as he used to.  History is obtained from daughter who is at bedside.  Patient do awake and alert now with improvement in mental status, is not cooperating and is unable to  give me a history.  Daughter denies abdominal pain, reports patient complained of pain in his rectum.   Subjective: pt often pursing his lips or pressing his tongue to resist cup/spoon Assessment / Plan / Recommendation CHL IP CLINICAL IMPRESSIONS 03/22/2019 Clinical Impression Pt has a moderate oropharyngeal dysphagia that appears to be a combined result of cognitive impairments and generalized weakness. He has resistance to oral intake, often pursuing his lips or pushing his tongue against the spoon/cup, making it difficult to challenge him with larger boluses. He could not obtain liquid via straw. Oral holding and lingual rocking is observed despite Mod-Max cues for timely posterior transfer. Mild lingual residue is present across all consistencies, although slightly more so with purees. His pharyngeal swallow triggers in a timely manner but he has reduced hyolaryngeal movement, base of tongue retraction, and pharyngeal  squeeze. Mild residue is in the pyriform sinuses with nectar thick liquids, with more moderate residue in the valleculae and pyriform sinuses given purees. Pt single episodes of trace and transient penetration with both thin and nectar thick liquids, but all throat clearing and coughing was observed to be in the absence of any airway compromise. Recommend Dys 1 (puree) diet and thin liquids by cup or spoon. Cognitive deficits make it more likely that episodic aspiration could occur, and they also make it more likely that he will not get enough nutrition/hydration. Therefore, full supervision during PO intake is recommend to alleviate risk as much as possible. SLP Visit Diagnosis Dysphagia, oropharyngeal phase (R13.12) Attention and concentration deficit following -- Frontal lobe and executive function deficit following -- Impact on safety and function Mild aspiration risk;Moderate aspiration risk   CHL IP TREATMENT RECOMMENDATION 03/22/2019 Treatment Recommendations Therapy as outlined in treatment  plan below   Prognosis 03/22/2019 Prognosis for Safe Diet Advancement Fair Barriers to Reach Goals Cognitive deficits Barriers/Prognosis Comment -- CHL IP DIET RECOMMENDATION 03/22/2019 SLP Diet Recommendations Dysphagia 1 (Puree) solids;Thin liquid Liquid Administration via Cup;Spoon Medication Administration Crushed with puree Compensations Minimize environmental distractions;Slow rate;Small sips/bites;Lingual sweep for clearance of pocketing Postural Changes Seated upright at 90 degrees   CHL IP OTHER RECOMMENDATIONS 03/22/2019 Recommended Consults -- Oral Care Recommendations Oral care BID Other Recommendations --   CHL IP FOLLOW UP RECOMMENDATIONS 03/22/2019 Follow up Recommendations Home health SLP;24 hour supervision/assistance   CHL IP FREQUENCY AND DURATION 03/22/2019 Speech Therapy Frequency (ACUTE ONLY) min 2x/week Treatment Duration 2 weeks      CHL IP ORAL PHASE 03/22/2019 Oral Phase Impaired Oral - Pudding Teaspoon -- Oral - Pudding Cup -- Oral - Honey Teaspoon -- Oral - Honey Cup -- Oral - Nectar Teaspoon Reduced posterior propulsion;Holding of bolus;Lingual/palatal residue Oral - Nectar Cup Reduced posterior propulsion;Holding of bolus;Lingual/palatal residue Oral - Nectar Straw -- Oral - Thin Teaspoon Reduced posterior propulsion;Holding of bolus;Lingual/palatal residue Oral - Thin Cup Reduced posterior propulsion;Holding of bolus;Lingual/palatal residue Oral - Thin Straw -- Oral - Puree Reduced posterior propulsion;Holding of bolus;Lingual/palatal residue Oral - Mech Soft -- Oral - Regular -- Oral - Multi-Consistency -- Oral - Pill -- Oral Phase - Comment --  CHL IP PHARYNGEAL PHASE 03/22/2019 Pharyngeal Phase Impaired Pharyngeal- Pudding Teaspoon -- Pharyngeal -- Pharyngeal- Pudding Cup -- Pharyngeal -- Pharyngeal- Honey Teaspoon -- Pharyngeal -- Pharyngeal- Honey Cup -- Pharyngeal -- Pharyngeal- Nectar Teaspoon Reduced pharyngeal peristalsis;Reduced anterior laryngeal mobility;Reduced laryngeal  elevation;Reduced tongue base retraction;Pharyngeal residue - pyriform Pharyngeal -- Pharyngeal- Nectar Cup Reduced pharyngeal peristalsis;Reduced anterior laryngeal mobility;Reduced laryngeal elevation;Reduced tongue base retraction;Pharyngeal residue - pyriform;Penetration/Aspiration before swallow Pharyngeal Material enters airway, remains ABOVE vocal cords then ejected out Pharyngeal- Nectar Straw -- Pharyngeal -- Pharyngeal- Thin Teaspoon Reduced pharyngeal peristalsis;Reduced anterior laryngeal mobility;Reduced laryngeal elevation;Reduced tongue base retraction Pharyngeal -- Pharyngeal- Thin Cup Reduced pharyngeal peristalsis;Reduced anterior laryngeal mobility;Reduced laryngeal elevation;Reduced tongue base retraction;Penetration/Aspiration before swallow Pharyngeal Material enters airway, remains ABOVE vocal cords then ejected out Pharyngeal- Thin Straw -- Pharyngeal -- Pharyngeal- Puree Reduced pharyngeal peristalsis;Reduced anterior laryngeal mobility;Reduced laryngeal elevation;Reduced tongue base retraction;Pharyngeal residue - pyriform;Pharyngeal residue - valleculae Pharyngeal -- Pharyngeal- Mechanical Soft -- Pharyngeal -- Pharyngeal- Regular -- Pharyngeal -- Pharyngeal- Multi-consistency -- Pharyngeal -- Pharyngeal- Pill -- Pharyngeal -- Pharyngeal Comment --  CHL IP CERVICAL ESOPHAGEAL PHASE 03/22/2019 Cervical Esophageal Phase (No Data) Pudding Teaspoon -- Pudding Cup -- Honey Teaspoon -- Honey Cup -- Nectar Teaspoon -- Nectar Cup -- Nectar Straw -- Thin  Teaspoon -- Thin Cup -- Thin Straw -- Puree -- Mechanical Soft -- Regular -- Multi-consistency -- Pill -- Cervical Esophageal Comment -- Venita Sheffield Nix 03/22/2019, 12:26 PM  Pollyann Glen, M.A. CCC-SLP Acute Rehabilitation Services Pager [phone] Office [phone]              Microbiology: Recent Results (from the past 240 hour(s))  Blood Culture (routine x 2)     Status: None   Collection Time: 03/17/19  4:25 PM  Result Value Ref Range  Status   Specimen Description BLOOD RIGHT HAND  Final   Special Requests AEROBIC BOTTLE ONLY Blood Culture adequate volume  Final   Culture   Final    NO GROWTH 5 DAYS Performed at [facility], [address] [place], [place] [address]    Report Status 03/22/2019 FINAL  Final  Blood Culture (routine x 2)     Status: None   Collection Time: 03/17/19  9:12 PM  Result Value Ref Range Status   Specimen Description BLOOD RIGHT ANTECUBITAL  Final   Special Requests   Final    BOTTLES DRAWN AEROBIC AND ANAEROBIC Blood Culture adequate volume   Culture   Final    NO GROWTH 5 DAYS Performed at [facility], [address], [place], [place] [address]    Report Status 03/22/2019 FINAL  Final  Gastrointestinal Panel by PCR , Stool     Status: Abnormal   Collection Time: 03/18/19  9:00 AM  Result Value Ref Range Status   Campylobacter species NOT DETECTED NOT DETECTED Final   Plesimonas shigelloides NOT DETECTED NOT DETECTED Final   Salmonella species NOT DETECTED NOT DETECTED Final   Yersinia enterocolitica NOT DETECTED NOT DETECTED Final   Vibrio species NOT DETECTED NOT DETECTED Final   Vibrio cholerae NOT DETECTED NOT DETECTED Final   Enteroaggregative E coli (EAEC) NOT DETECTED NOT DETECTED Final   Enteropathogenic E coli (EPEC) DETECTED (A) NOT DETECTED Final    Comment: RESULT CALLED TO, READ BACK BY AND VERIFIED WITH:  AMINA MOUHAMED AT 1609 03/19/19 SDR    Enterotoxigenic E coli (ETEC) NOT DETECTED NOT DETECTED Final   Shiga like toxin producing E coli (STEC) NOT DETECTED NOT DETECTED Final   Shigella/Enteroinvasive E coli (EIEC) NOT DETECTED NOT DETECTED Final   Cryptosporidium NOT DETECTED NOT DETECTED Final   Cyclospora cayetanensis NOT DETECTED NOT DETECTED Final   Entamoeba histolytica NOT DETECTED NOT DETECTED Final   Giardia lamblia NOT DETECTED NOT DETECTED Final   Adenovirus F40/41 NOT DETECTED NOT DETECTED Final   Astrovirus NOT DETECTED NOT DETECTED Final   Norovirus  GI/GII NOT DETECTED NOT DETECTED Final   Rotavirus A NOT DETECTED NOT DETECTED Final   Sapovirus (I, II, IV, and V) NOT DETECTED NOT DETECTED Final    Comment: Performed at [facility], [address] [place], [place] [address]  C Difficile Quick Screen w PCR reflex     Status: None   Collection Time: 03/18/19  9:00 AM  Result Value Ref Range Status   C Diff antigen NEGATIVE NEGATIVE Final   C Diff toxin NEGATIVE NEGATIVE Final   C Diff interpretation No C. difficile detected.  Final    Comment: Performed at [facility], [address], [place], [place] [address]  MRSA PCR Screening     Status: None   Collection Time: 03/22/19 11:38 AM  Result Value Ref Range Status   MRSA by PCR NEGATIVE NEGATIVE Final    Comment:  The GeneXpert MRSA Assay (FDA approved for NASAL specimens only), is one component of a comprehensive MRSA colonization surveillance program. It is not intended to diagnose MRSA infection nor to guide or monitor treatment for MRSA infections. Performed at [facility], [place] [address], [place], [place] [address]      Labs: Basic Metabolic Panel: Recent Labs  Lab 03/19/19 [phone] 03/19/19 1440  03/20/19 0207  03/22/19 0357 03/22/19 1011 03/22/19 1344 03/22/19 1710 03/22/19 2230 03/23/19 0242  NA 149* 153*   < > 150*   < > 150* 148* 147* 149* 149* 146*  K 4.3 4.7   < > 3.7   < > 3.8 3.8 4.4 3.9 4.0 4.1  CL 119* 113*   < > 113*   < > 114* 118* 116* 113* 112* 114*  CO2 19* 20*   < > 24   < > 27 27 26 30 27 27   GLUCOSE 67* 72   < > 74   < > 126* 129* 107* 124* 106* 101*  BUN 112* 102*   < > 96*   < > 62* 57* 54* 53* 51* 47*  CREATININE 4.85* 4.12*   < > 3.76*   < > 2.30* 2.08* 2.12* 2.03* 1.97* 1.80*  CALCIUM 7.1* 7.8*   < > 7.4*   < > 7.7* 7.7* 7.8* 7.8* 8.1* 7.6*  PHOS 5.2* 5.0*  --  4.4  --  2.5  --   --   --   --  2.3*   < > = values in this interval not displayed.   Liver Function Tests: Recent Labs  Lab 03/17/19 1625  03/19/19 0023 03/19/19 0208 03/19/19 1440 03/20/19 0207 03/22/19 0357 03/23/19 0242  AST 61* 80*  --   --   --   --   --   ALT 36 32  --   --   --   --   --   ALKPHOS 68 64  --   --   --   --   --   BILITOT 0.5 0.7  --   --   --   --   --   PROT 7.1 6.1*  --   --   --   --   --   ALBUMIN 2.6* 2.0* 1.9* 1.5* 1.5* 1.6* 1.7*   Recent Labs  Lab 03/17/19 1625  LIPASE 72*   No results for input(s): AMMONIA in the last 168 hours. CBC: Recent Labs  Lab 03/17/19 1625 03/18/19 0217 03/19/19 0023 03/20/19 0558 03/21/19 0555  WBC 7.6 5.7 6.2 3.8* 3.0*  NEUTROABS 5.1  --  4.3  --   --   HGB 11.8* 11.5* 10.7* 9.0* 9.2*  HCT 40.4 39.1 34.0* 29.3* 30.2*  MCV 109.8* 109.8* 103.0* 101.4* 102.4*  PLT 191 164 142* 111* 102*   Cardiac Enzymes: Recent Labs  Lab 03/19/19 0023 03/19/19 0906 03/19/19 1459 03/19/19 2124  TROPONINI 2.75* QUANTITY NOT SUFFICIENT, UNABLE TO PERFORM TEST 0.86* 0.67*   BNP: BNP (last 3 results) No results for input(s): BNP in the last 8760 hours.  ProBNP (last 3 results) No results for input(s): PROBNP in the last 8760 hours.  CBG: No results for input(s): GLUCAP in the last 168 hours.     Signed:  Kayleen Memos, MD [facility] 03/23/2019, 8:31 AM

## 2019-03-24 NOTE — Progress Notes (Signed)
Went trough with  the discharge instructions  with daughter Carlyon Shadow on the phone. verbalized understanding.

## 2019-03-24 NOTE — TOC Transition Note (Signed)
Transition of Care [facility]) - CM/SW Discharge Note   Patient Details  Name: Robert Barnett MRN: [id number] Date of Birth: 01-21-1932  [facility] CM/SW Contact:  Candie Chroman, LCSW Phone Number: 03/24/2019, 11:11 AM   Clinical Narrative:  Patient discharging home today. Confirmed address with daughter Carlyon Shadow and set up PTAR. CSW signing off.  Final next level of care: [place] Barriers to Discharge: Barriers Resolved   Patient Goals and CMS Choice Patient states their goals for this hospitalization and ongoing recovery are:: Patient not fully oriented. CMS Medicare.gov Compare Post Acute Care list provided to:: Patient Represenative (must comment)(Read scores over the phone to daughter.) Choice offered to / list presented to : Adult Children  Discharge Placement                Patient to be transferred to facility by: PTAR to home Name of family member notified: Fran Lowes Patient and family notified of of transfer: 03/24/19  Discharge Plan and Services In-house Referral: Hospice / Palliative Care Discharge Planning Services: CM Consult Post Acute Care Choice: Durable Medical Equipment, Home Health          DME Arranged: Hospital bed(Hoyer Lift) DME Agency: [facility] HH Arranged: RN, PT     Social Determinants of Health (SDOH) Interventions     Readmission Risk Interventions No flowsheet data found.

## 2019-03-24 NOTE — Care Management (Addendum)
03/24/2019  CM confirmed with pts daughter Carlyon Shadow that all equipment has been delivered and set up in the home.  Family is ready to receive pt in the home today.  CSW will help facilitate pts transport home via PTAR.  Carlyon Shadow can be reached at [phone]  03/23/19 Pt now has discharge order for home.  CM spoke with both [name] and Carlyon Shadow (daughters) .  Both pts son and daughter will provide 24 hour care.  [facility] scheduled to deliver equipment to pts home between 11-3pm.  CM request attending to contact family and provide clinical update along with post discharge care/needs.  Pt will need PTAR transport home at discharge   03/22/19 CM informed family would like to take pt home and are now declining SNF.  Pt family request hospital bed and hoyer life - orders written.  Medicare.gov list given for DME - [facility] chosen - referral given and accepted by [facility]. Attending is a aware of discharge plan change to home

## 2019-03-24 NOTE — Discharge Summary (Signed)
Discharge Summary  Robert Barnett HBZ:[id number] DOB: 1932-05-14  PCP: Janith Lima, MD  Admit date: 03/17/2019 Discharge date: 03/24/2019  Time spent: 35 minutes  UPDATE: DC DELAYED DUE TO FAMILY NOT HAVING ALL THE EQUIPMENTS SET UP AT HOME TO RECEIVE PATIENT.   Spoke with the patient's daughter Robert Barnett on 03/23/19 who states patient does not have his set up ready to return home today. She is hoping everything will be delivered by this evening. Plan to DC home tomorrow 03/24/19.    Recommendations for Outpatient Follow-up:  1. Follow-up with your PCP 2. Continue physical therapy 3. Take your medications as prescribed 4. Fall precautions  Discharge Diagnoses:  Active Hospital Problems   Diagnosis Date Noted  . Goals of care, counseling/discussion   . Palliative care encounter   . CAD (coronary artery disease), native coronary artery 03/19/2019  . Mental status alteration 03/19/2019  . Fever 03/19/2019  . Diarrhea 03/19/2019  . Elevated troponin 03/19/2019  . AKI (acute kidney injury) ([place]) 03/17/2019  . History of CVA (cerebrovascular accident) 06/14/2018  . CKD (chronic kidney disease) stage 4, GFR 15-29 ml/min (HCC) 09/03/2017  . 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 Essential hypertension 07/30/2017  . Moderate dementia without behavioral disturbance ([place]) 07/23/2016    Resolved Hospital Problems  No resolved problems to display.    Discharge Condition: Stable  Diet recommendation: Dysphagia 1 pure diet with thin liquids; meds crushed with pure as recommended by speech therapist.  Vitals:   03/24/19 0317 03/24/19 0733  BP: (!) 144/80 (!) 148/66  Pulse: 61 61  Resp: (!) 22 (!) 24  Temp: 97.7 F (36.5 C) 98.1 F (36.7 C)  SpO2: 97% 97%    History of present illness:  Robert Barnett a 85 y.o.male from home with medical history significant fordementia, CKD4, AAA, BPH, hypothyrodism,whowas bought to the Ed with c/o mutiple episodes of  loose stool and generalized weakness over the past week. Today patient was not responding or communicatingas heused to.History is obtained from daughter who is at bedside. Patient awake and alert now with improvement in mental status,is not cooperating andis unable to give a history.Daughter denies abdominal pain, reports patient complained of pain in his rectum.  No difficulty breathing normal, no cough, no fever or chills, no sick contacts, no recent travels.  At baseline patient has dementia, most times herecognizes close family members, answers simple questions, but needs assistance with most ADLs.  OnEMS arrival blood pressure60/40.    Given IV fluid with improvement of his blood pressure.  Was also significantly dehydrated.  Elevated troponin which trended down with suspicion for demand ischemia from acute illness.  2D echo unremarkable.  Cardiology consulted and signed off.  Significant AKI and hypovolemic hypernatremia, suspect prerenal secondary to severe dehydration.  Nephrology contacted and followed.  Signed off on 03/22/2019  Hospital course complicated by somnolence, lethargy, improved after IV fluid hydration.  03/23/19: Patient seen and examined at his bedside.  No acute events overnight.  He has no new complaints.  He is more alert today and interactive.  O2 saturation 100% on room air.  Vital signs and labs reviewed and are stable.    PT assessed and recommended SNF.  Family declines SNF and wants patient to come home.  Case manager consulted to assist with home health services.   On the day of discharge, the patient was hemodynamically stable.  He will need to follow-up with his primary care provider and continue physical therapy.  Fall precautions.  Hospital Course:  Active Problems:   Moderate dementia without behavioral disturbance (HCC)   Essential hypertension   Colovesical fistula   CKD (chronic kidney disease) stage 4, GFR 15-29 ml/min  (HCC)   History of CVA (cerebrovascular accident)   AKI (acute kidney injury) ([place])   CAD (coronary artery disease), native coronary artery   Mental status alteration   Fever   Diarrhea   Elevated troponin   Goals of care, counseling/discussion   Palliative care encounter   Resolving acute metabolic encephalopathy in the setting of dementia suspect secondary to severe dehydration versus aspiration pneumonia versus others Appears to be back to his baseline. He is alert and interactive Vital signs and labs reviewed and stable Completed IV fluid hydration and IV antibiotics  Resolving aspiration pneumonia Presented with reported fever 102.9, hypoxia, dysphagia, chest x-ray suggestive of right middle lobe pneumonia with suspicion for aspiration Speech therapist consulted and followed with recommendations for dysphagia 1 pure with thin liquids and meds crushed with pure. Completed 5 days of IV meropenem  Dysphagia Speech therapist followed with recommendations as stated above Aspiration precautions in place Feeding assistance  Improving AKI on CKD 3 suspect prerenal secondary to dehydration Presented with creatinine of 4.12 Creatinine continues to improve  with IV fluid hydration 1.80 from 2.30 from 2.77 from 3.50 on 03/20/2019 Continue to avoid dehydration Encourage oral fluid intake  Chronic macrocytic anemia Hemoglobin stable at 9.2 No sign of overt bleeding Follow-up with your primary care provider  Chronic thrombocytopenia, unclear etiology Platelets stable 102K No sign of mucosal bleed  Resolving hypovolemic hypernatremia Presented with sodium level 151 and dry mucous membranes Sodium on 03/23/2019 was 146 Completed D5W at 125 cc/h of IV fluid infusion Avoid dehydration Encourage frequent oral fluid intake Follow-up with your PCP and repeat labs  Elevated troponin suspect secondary to demand ischemia in the setting of acute illness Troponins peaked at 2.75  and trended down 2D echo done and unremarkable per cardiology Denies chest pain  Diarrhea secondary to enteropathogenic E. coli C. difficile negative GI panel positive for enteropathogenic E. coli Avoid dehydration Feeding as tolerated  Physical debility/ambulatory dysfunction Patient is from home PT assessed and recommended SNF Family declines SNF We will discharge home with home health services Fall precautions  Severe protein calorie malnutrition Albumin 1.5 with BMI of 28 Continue to encourage increasing oral protein calorie intake Avoid aspiration  Resolved hypotension Suspect secondary to volume loss severe dehydration with persistent diarrhea Blood pressure currently normotensive Follow-up with your PCP     Code Status: Full code   Consultants:  Nephrology  Cardiology signed off on 03/20/2019  Procedures:  2D echo  Antimicrobials:  Meropenem  DVT prophylaxis: Subcu heparin 3 times daily    Discharge Exam: BP (!) 148/66 (BP Location: Left Arm)   Pulse 61   Temp 98.1 F (36.7 C) (Oral)   Resp (!) 24   Ht 5\' 10"  (1.778 m)   Wt 94.3 kg   SpO2 97%   BMI 29.84 kg/m  . General: 85 y.o. year-old male well developed well nourished in no acute distress.  Alert and oriented x3. . Cardiovascular: Regular rate and rhythm with no rubs or gallops.  No thyromegaly or JVD noted.   Marland Kitchen Respiratory: Clear to auscultation with no wheezes or rales. Good inspiratory effort. . Abdomen: Soft nontender nondistended with normal bowel sounds x4 quadrants. . Musculoskeletal: No lower extremity edema. 2/4 pulses in all 4 extremities. . Skin: No ulcerative lesions noted or rashes, .  Psychiatry: Mood is appropriate for condition and setting  Discharge Instructions You were cared for by a hospitalist during your hospital stay. If you have any questions about your discharge medications or the care you received while you were in the hospital after you are  discharged, you can call the unit and asked to speak with the hospitalist on call if the hospitalist that took care of you is not available. Once you are discharged, your primary care physician will handle any further medical issues. Please note that NO REFILLS for any discharge medications will be authorized once you are discharged, as it is imperative that you return to your primary care physician (or establish a relationship with a primary care physician if you do not have one) for your aftercare needs so that they can reassess your need for medications and monitor your lab values.  Discharge Instructions    Diet - low sodium heart healthy   Complete by:  As directed    Dysphagia 1 pure diet with thin liquids; meds crushed with pure as recommended by speech therapist.   Increase activity slowly   Complete by:  As directed      Allergies as of 03/24/2019   No Known Allergies     Medication List    STOP taking these medications   benazepril 40 MG tablet Commonly known as:  LOTENSIN   Colchicine 0.6 MG Caps   dronabinol 2.5 MG capsule Commonly known as:  Marinol     TAKE these medications   amLODipine 5 MG tablet Commonly known as:  NORVASC Take 1 tablet (5 mg total) by mouth daily.   aspirin 81 MG tablet Take 1 tablet (81 mg total) by mouth daily.   chlorhexidine 0.12 % solution Commonly known as:  PERIDEX 15 mLs by Mouth Rinse route 2 (two) times daily.   clopidogrel 75 MG tablet Commonly known as:  PLAVIX TAKE 1 TABLET BY MOUTH EVERY DAY   donepezil 10 MG tablet Commonly known as:  ARICEPT Take 1 tablet by mouth at bedtime.   feeding supplement (ENSURE ENLIVE) Liqd Take 237 mLs by mouth 2 (two) times daily between meals.   ferrous sulfate 325 (65 FE) MG EC tablet TAKE 1 TABLET BY MOUTH EVERY DAY WITH BREAKFAST   folic acid 1 MG tablet Commonly known as:  FOLVITE TAKE 1 TABLET BY MOUTH EVERY DAY   Gerhardt's butt cream Crea Apply 1 application topically 2  (two) times daily.   levothyroxine 50 MCG tablet Commonly known as:  SYNTHROID, LEVOTHROID TAKE 1 TABLET (50 MCG TOTAL) BY MOUTH DAILY.   simvastatin 20 MG tablet Commonly known as:  ZOCOR Take 1 tablet (20 mg total) by mouth daily.   thiamine 100 MG tablet TAKE 1 TABLET BY MOUTH EVERY DAY   triamcinolone cream 0.5 % Commonly known as:  KENALOG Apply 1 application topically 2 (two) times daily.            Durable Medical Equipment  (From admission, onward)         Start     Ordered   03/22/19 1044  For home use only DME Hospital bed  Once    Comments:  Dementia  Question Answer Comment  Patient has (list medical condition): dementia   The above medical condition requires: Patient requires the ability to reposition frequently   Head must be elevated greater than: 30 degrees   Bed type Semi-electric   Hoyer Lift Yes   Support Surface: Gel Overlay  03/22/19 1044         No Known Allergies Follow-up Information    [facility].   Specialty:  Home Health Services Why:  They will follow up with you for home health needs.       Janith Lima, MD. Call in 1 day(s).   Specialty:  Internal Medicine Why:  Please call for a post hospital follow-up appointment Contact information: [address] [place] [address] [phone]            The results of significant diagnostics from this hospitalization (including imaging, microbiology, ancillary and laboratory) are listed below for reference.    Significant Diagnostic Studies: Ct Abdomen Pelvis Wo Contrast  Result Date: 03/18/2019 CLINICAL DATA:  85 year old male with history of dementia. Generalized weakness over the past week. EXAM: CT ABDOMEN AND PELVIS WITHOUT CONTRAST TECHNIQUE: Multidetector CT imaging of the abdomen and pelvis was performed following the standard protocol without IV contrast. COMPARISON:  CT the abdomen and pelvis 05/27/2018. FINDINGS:  Lower chest: Heart size is normal. There is no significant pericardial fluid, thickening or pericardial calcification. There is aortic atherosclerosis, as well as atherosclerosis of the great vessels of the mediastinum and the coronary arteries, including calcified atherosclerotic plaque in the left main, left anterior descending, left circumflex and right coronary arteries. Calcifications of the aortic valve. Bibasilar areas of volume loss and architectural distortion with some peribronchovascular ground-glass attenuation, which may reflect sequela of mild aspiration. Hepatobiliary: No definite suspicious cystic or solid hepatic lesions are confidently identified on today's noncontrast CT examination. Multiple small calcified gallstones lying dependently in the gallbladder. No surrounding inflammatory changes to suggest an acute cholecystitis at this time. Pancreas: No definite pancreatic mass or peripancreatic fluid or inflammatory changes noted on today's noncontrast CT examination. Spleen: Unremarkable. Adrenals/Urinary Tract: Low-attenuation lesions in both kidneys, incompletely characterized on today's noncontrast CT examination, but statistically likely to represent cysts, measuring up to 3.7 cm in the upper pole of the right kidney. Bilateral adrenal glands are normal in appearance. No hydroureteronephrosis. Stomach/Bowel: Normal appearance of the stomach. No pathologic dilatation of small bowel or colon. Numerous colonic diverticulae are noted, without surrounding inflammatory changes to suggest an acute diverticulitis at this time. Normal appendix. Adjacent to the mid sigmoid colon there is again a pigtail drainage catheter which is immediately anterior to the superior aspect of the urinary bladder and immediately deep to the anterior abdominal wall musculature. This is within a completely collapsed cavity. Vascular/Lymphatic: Aortic atherosclerosis. Aneurysmal dilatation of the common iliac arteries  bilaterally measuring 3.1 cm on the right and 2.3 cm on the left. No lymphadenopathy noted in the abdomen or pelvis. Reproductive: Prostate gland and seminal vesicles are unremarkable in appearance. Other: No significant volume of ascites.  No pneumoperitoneum. Musculoskeletal: There are no aggressive appearing lytic or blastic lesions noted in the visualized portions of the skeleton. IMPRESSION: 1. There are no definite acute findings noted in the abdomen or pelvis to account for the patient's symptoms. 2. Dependent changes in the lung bases bilaterally concerning for sequela of mild aspiration. 3. Indwelling pigtail drainage catheter in the low anterior anatomic pelvis for chronic colocutaneous fistula redemonstrated. This is within a completely collapsed collection. No new abscess identified. 4. Colonic diverticulosis without evidence of acute diverticulitis at this time. 5. Aortic atherosclerosis, in addition to left main and 3 vessel coronary artery disease. 6. There are calcifications of the aortic valve. Echocardiographic correlation for evaluation of potential valvular  dysfunction may be warranted if clinically indicated. Electronically Signed   By: Vinnie Langton M.D.   On: 03/18/2019 09:15   Dg Chest Port 1 View  Result Date: 03/19/2019 CLINICAL DATA:  Dyspnea, multiple episodes of loose stools, hypothyroidism, dementia, chronic kidney disease EXAM: PORTABLE CHEST 1 VIEW COMPARISON:  Portable exam 0020 hours compared to 03/17/2019 FINDINGS: Normal heart size mediastinal contours. BILATERAL lower lobe infiltrates greater on RIGHT consistent with pneumonia. Upper lungs clear. No pleural effusion or pneumothorax. Osseous structures unremarkable. IMPRESSION: Bibasilar infiltrates consistent with pneumonia, greater on RIGHT. Electronically Signed   By: Lavonia Dana M.D.   On: 03/19/2019 00:35   Dg Chest Port 1 View  Result Date: 03/17/2019 CLINICAL DATA:  Diarrhea. EXAM: PORTABLE CHEST 1 VIEW  COMPARISON:  No recent prior. FINDINGS: Stable cardiomegaly with normal pulmonary vascularity. Low lung volumes with mild basilar atelectasis. No pleural effusion or pneumothorax. Interposition of the colon under the right hemidiaphragm. IMPRESSION: Stable cardiomegaly with normal pulmonary vascularity. Low lung volumes with mild basilar atelectasis. No focal infiltrate. Electronically Signed   By: Marcello Moores  Register   On: 03/17/2019 15:40   Dg Swallowing Func-speech Pathology  Result Date: 03/22/2019 Objective Swallowing Evaluation: Type of Study: MBS-Modified Barium Swallow Study  Patient Details Name: Abimael Zeiter MRN: [id number] Date of Birth: Dec 12, 1932 Today's Date: 03/22/2019 Time: SLP Start Time (ACUTE ONLY): 2263 -SLP Stop Time (ACUTE ONLY): 1155 SLP Time Calculation (min) (ACUTE ONLY): 24 min Past Medical History: Past Medical History: Diagnosis Date . Arthritis  . BPH (benign prostatic hyperplasia)  . Carotid artery occlusion  . Dementia ([place])  . Dysphagia  . Hyperlipidemia  . Hypertension  . Peripheral vascular disease ([place])  . Stroke [facility])   Right hemispheric CVA . Umbilical hernia  Past Surgical History: Past Surgical History: Procedure Laterality Date . CAROTID ENDARTERECTOMY  10/06/2007  Right CEA by Dr. Amedeo Plenty . IR CATHETER TUBE CHANGE  09/15/2017 . IR CATHETER TUBE CHANGE  12/03/2017 . IR CATHETER TUBE CHANGE  12/23/2017 . IR CATHETER TUBE CHANGE  05/07/2018 . IR CATHETER TUBE CHANGE  06/29/2018 . IR CATHETER TUBE CHANGE  08/18/2018 . IR CATHETER TUBE CHANGE  09/08/2018 . IR CATHETER TUBE CHANGE  10/15/2018 . IR CATHETER TUBE CHANGE  01/26/2019 . IR GENERIC HISTORICAL  07/09/2016  IR RADIOLOGIST EVAL & MGMT 07/09/2016 Arne Cleveland, MD [facility] INTERV RAD . IR RADIOLOGIST EVAL & MGMT  09/15/2017 . IR RADIOLOGIST EVAL & MGMT  05/06/2018 . PEG TUBE PLACEMENT  2008  after CVA HPI: 85 y.o. male with medical history significant for dementia, CKD4, AAA, BPH, hypothyrodism, who was bought to the Ed with c/o  mutiple episodes of loose stool and generalized weakness over the past week.  Today patient was not responding communicating as he used to.  History is obtained from daughter who is at bedside.  Patient do awake and alert now with improvement in mental status, is not cooperating and is unable to give me a history.  Daughter denies abdominal pain, reports patient complained of pain in his rectum.   Subjective: pt often pursing his lips or pressing his tongue to resist cup/spoon Assessment / Plan / Recommendation CHL IP CLINICAL IMPRESSIONS 03/22/2019 Clinical Impression Pt has a moderate oropharyngeal dysphagia that appears to be a combined result of cognitive impairments and generalized weakness. He has resistance to oral intake, often pursuing his lips or pushing his tongue against the spoon/cup, making it difficult to challenge him with larger boluses. He could not obtain liquid via  straw. Oral holding and lingual rocking is observed despite Mod-Max cues for timely posterior transfer. Mild lingual residue is present across all consistencies, although slightly more so with purees. His pharyngeal swallow triggers in a timely manner but he has reduced hyolaryngeal movement, base of tongue retraction, and pharyngeal squeeze. Mild residue is in the pyriform sinuses with nectar thick liquids, with more moderate residue in the valleculae and pyriform sinuses given purees. Pt single episodes of trace and transient penetration with both thin and nectar thick liquids, but all throat clearing and coughing was observed to be in the absence of any airway compromise. Recommend Dys 1 (puree) diet and thin liquids by cup or spoon. Cognitive deficits make it more likely that episodic aspiration could occur, and they also make it more likely that he will not get enough nutrition/hydration. Therefore, full supervision during PO intake is recommend to alleviate risk as much as possible. SLP Visit Diagnosis Dysphagia, oropharyngeal  phase (R13.12) Attention and concentration deficit following -- Frontal lobe and executive function deficit following -- Impact on safety and function Mild aspiration risk;Moderate aspiration risk   CHL IP TREATMENT RECOMMENDATION 03/22/2019 Treatment Recommendations Therapy as outlined in treatment plan below   Prognosis 03/22/2019 Prognosis for Safe Diet Advancement Fair Barriers to Reach Goals Cognitive deficits Barriers/Prognosis Comment -- CHL IP DIET RECOMMENDATION 03/22/2019 SLP Diet Recommendations Dysphagia 1 (Puree) solids;Thin liquid Liquid Administration via Cup;Spoon Medication Administration Crushed with puree Compensations Minimize environmental distractions;Slow rate;Small sips/bites;Lingual sweep for clearance of pocketing Postural Changes Seated upright at 90 degrees   CHL IP OTHER RECOMMENDATIONS 03/22/2019 Recommended Consults -- Oral Care Recommendations Oral care BID Other Recommendations --   CHL IP FOLLOW UP RECOMMENDATIONS 03/22/2019 Follow up Recommendations Home health SLP;24 hour supervision/assistance   CHL IP FREQUENCY AND DURATION 03/22/2019 Speech Therapy Frequency (ACUTE ONLY) min 2x/week Treatment Duration 2 weeks      CHL IP ORAL PHASE 03/22/2019 Oral Phase Impaired Oral - Pudding Teaspoon -- Oral - Pudding Cup -- Oral - Honey Teaspoon -- Oral - Honey Cup -- Oral - Nectar Teaspoon Reduced posterior propulsion;Holding of bolus;Lingual/palatal residue Oral - Nectar Cup Reduced posterior propulsion;Holding of bolus;Lingual/palatal residue Oral - Nectar Straw -- Oral - Thin Teaspoon Reduced posterior propulsion;Holding of bolus;Lingual/palatal residue Oral - Thin Cup Reduced posterior propulsion;Holding of bolus;Lingual/palatal residue Oral - Thin Straw -- Oral - Puree Reduced posterior propulsion;Holding of bolus;Lingual/palatal residue Oral - Mech Soft -- Oral - Regular -- Oral - Multi-Consistency -- Oral - Pill -- Oral Phase - Comment --  CHL IP PHARYNGEAL PHASE 03/22/2019 Pharyngeal Phase  Impaired Pharyngeal- Pudding Teaspoon -- Pharyngeal -- Pharyngeal- Pudding Cup -- Pharyngeal -- Pharyngeal- Honey Teaspoon -- Pharyngeal -- Pharyngeal- Honey Cup -- Pharyngeal -- Pharyngeal- Nectar Teaspoon Reduced pharyngeal peristalsis;Reduced anterior laryngeal mobility;Reduced laryngeal elevation;Reduced tongue base retraction;Pharyngeal residue - pyriform Pharyngeal -- Pharyngeal- Nectar Cup Reduced pharyngeal peristalsis;Reduced anterior laryngeal mobility;Reduced laryngeal elevation;Reduced tongue base retraction;Pharyngeal residue - pyriform;Penetration/Aspiration before swallow Pharyngeal Material enters airway, remains ABOVE vocal cords then ejected out Pharyngeal- Nectar Straw -- Pharyngeal -- Pharyngeal- Thin Teaspoon Reduced pharyngeal peristalsis;Reduced anterior laryngeal mobility;Reduced laryngeal elevation;Reduced tongue base retraction Pharyngeal -- Pharyngeal- Thin Cup Reduced pharyngeal peristalsis;Reduced anterior laryngeal mobility;Reduced laryngeal elevation;Reduced tongue base retraction;Penetration/Aspiration before swallow Pharyngeal Material enters airway, remains ABOVE vocal cords then ejected out Pharyngeal- Thin Straw -- Pharyngeal -- Pharyngeal- Puree Reduced pharyngeal peristalsis;Reduced anterior laryngeal mobility;Reduced laryngeal elevation;Reduced tongue base retraction;Pharyngeal residue - pyriform;Pharyngeal residue - valleculae Pharyngeal -- Pharyngeal- Mechanical Soft -- Pharyngeal -- Pharyngeal- Regular --  Pharyngeal -- Pharyngeal- Multi-consistency -- Pharyngeal -- Pharyngeal- Pill -- Pharyngeal -- Pharyngeal Comment --  CHL IP CERVICAL ESOPHAGEAL PHASE 03/22/2019 Cervical Esophageal Phase (No Data) Pudding Teaspoon -- Pudding Cup -- Honey Teaspoon -- Honey Cup -- Nectar Teaspoon -- Nectar Cup -- Nectar Straw -- Thin Teaspoon -- Thin Cup -- Thin Straw -- Puree -- Mechanical Soft -- Regular -- Multi-consistency -- Pill -- Cervical Esophageal Comment -- Venita Sheffield Nix 03/22/2019,  12:26 PM  Pollyann Glen, M.A. CCC-SLP Acute Rehabilitation Services Pager [phone] Office [phone]              Microbiology: Recent Results (from the past 240 hour(s))  Blood Culture (routine x 2)     Status: None   Collection Time: 03/17/19  4:25 PM  Result Value Ref Range Status   Specimen Description BLOOD RIGHT HAND  Final   Special Requests AEROBIC BOTTLE ONLY Blood Culture adequate volume  Final   Culture   Final    NO GROWTH 5 DAYS Performed at [facility], [address] [place], [place] [address]    Report Status 03/22/2019 FINAL  Final  Blood Culture (routine x 2)     Status: None   Collection Time: 03/17/19  9:12 PM  Result Value Ref Range Status   Specimen Description BLOOD RIGHT ANTECUBITAL  Final   Special Requests   Final    BOTTLES DRAWN AEROBIC AND ANAEROBIC Blood Culture adequate volume   Culture   Final    NO GROWTH 5 DAYS Performed at [facility], [address] [address], [place], [place] [address]    Report Status 03/22/2019 FINAL  Final  Gastrointestinal Panel by PCR , Stool     Status: Abnormal   Collection Time: 03/18/19  9:00 AM  Result Value Ref Range Status   Campylobacter species NOT DETECTED NOT DETECTED Final   Plesimonas shigelloides NOT DETECTED NOT DETECTED Final   Salmonella species NOT DETECTED NOT DETECTED Final   Yersinia enterocolitica NOT DETECTED NOT DETECTED Final   Vibrio species NOT DETECTED NOT DETECTED Final   Vibrio cholerae NOT DETECTED NOT DETECTED Final   Enteroaggregative E coli (EAEC) NOT DETECTED NOT DETECTED Final   Enteropathogenic E coli (EPEC) DETECTED (A) NOT DETECTED Final    Comment: RESULT CALLED TO, READ BACK BY AND VERIFIED WITH:  AMINA MOUHAMED AT 1609 03/19/19 SDR    Enterotoxigenic E coli (ETEC) NOT DETECTED NOT DETECTED Final   Shiga like toxin producing E coli (STEC) NOT DETECTED NOT DETECTED Final   Shigella/Enteroinvasive E coli (EIEC) NOT DETECTED NOT DETECTED Final   Cryptosporidium NOT DETECTED NOT  DETECTED Final   Cyclospora cayetanensis NOT DETECTED NOT DETECTED Final   Entamoeba histolytica NOT DETECTED NOT DETECTED Final   Giardia lamblia NOT DETECTED NOT DETECTED Final   Adenovirus F40/41 NOT DETECTED NOT DETECTED Final   Astrovirus NOT DETECTED NOT DETECTED Final   Norovirus GI/GII NOT DETECTED NOT DETECTED Final   Rotavirus A NOT DETECTED NOT DETECTED Final   Sapovirus (I, II, IV, and V) NOT DETECTED NOT DETECTED Final    Comment: Performed at [facility], [address] [place], [place] [address]  C Difficile Quick Screen w PCR reflex     Status: None   Collection Time: 03/18/19  9:00 AM  Result Value Ref Range Status   C Diff antigen NEGATIVE NEGATIVE Final   C Diff toxin NEGATIVE NEGATIVE Final   C Diff interpretation No C. difficile detected.  Final    Comment: Performed  at [facility], [address] [place], [place] [address]  MRSA PCR Screening     Status: None   Collection Time: 03/22/19 11:38 AM  Result Value Ref Range Status   MRSA by PCR NEGATIVE NEGATIVE Final    Comment:        The GeneXpert MRSA Assay (FDA approved for NASAL specimens only), is one component of a comprehensive MRSA colonization surveillance program. It is not intended to diagnose MRSA infection nor to guide or monitor treatment for MRSA infections. Performed at [facility], [place] [address], [place], [place] [address]      Labs: Basic Metabolic Panel: Recent Labs  Lab 03/19/19 [phone] 03/19/19 1440  03/20/19 0207  03/22/19 0357 03/22/19 1011 03/22/19 1344 03/22/19 1710 03/22/19 2230 03/23/19 0242  NA 149* 153*   < > 150*   < > 150* 148* 147* 149* 149* 146*  K 4.3 4.7   < > 3.7   < > 3.8 3.8 4.4 3.9 4.0 4.1  CL 119* 113*   < > 113*   < > 114* 118* 116* 113* 112* 114*  CO2 19* 20*   < > 24   < > 27 27 26 30 27 27   GLUCOSE 67* 72   < > 74   < > 126* 129* 107* 124* 106* 101*  BUN 112* 102*   < > 96*   < > 62* 57* 54* 53* 51* 47*  CREATININE 4.85* 4.12*   < >  3.76*   < > 2.30* 2.08* 2.12* 2.03* 1.97* 1.80*  CALCIUM 7.1* 7.8*   < > 7.4*   < > 7.7* 7.7* 7.8* 7.8* 8.1* 7.6*  PHOS 5.2* 5.0*  --  4.4  --  2.5  --   --   --   --  2.3*   < > = values in this interval not displayed.   Liver Function Tests: Recent Labs  Lab 03/17/19 1625 03/19/19 0023 03/19/19 0208 03/19/19 1440 03/20/19 0207 03/22/19 0357 03/23/19 0242  AST 61* 80*  --   --   --   --   --   ALT 36 32  --   --   --   --   --   ALKPHOS 68 64  --   --   --   --   --   BILITOT 0.5 0.7  --   --   --   --   --   PROT 7.1 6.1*  --   --   --   --   --   ALBUMIN 2.6* 2.0* 1.9* 1.5* 1.5* 1.6* 1.7*   Recent Labs  Lab 03/17/19 1625  LIPASE 72*   No results for input(s): AMMONIA in the last 168 hours. CBC: Recent Labs  Lab 03/17/19 1625 03/18/19 0217 03/19/19 0023 03/20/19 0558 03/21/19 0555  WBC 7.6 5.7 6.2 3.8* 3.0*  NEUTROABS 5.1  --  4.3  --   --   HGB 11.8* 11.5* 10.7* 9.0* 9.2*  HCT 40.4 39.1 34.0* 29.3* 30.2*  MCV 109.8* 109.8* 103.0* 101.4* 102.4*  PLT 191 164 142* 111* 102*   Cardiac Enzymes: Recent Labs  Lab 03/19/19 0023 03/19/19 0906 03/19/19 1459 03/19/19 2124  TROPONINI 2.75* QUANTITY NOT SUFFICIENT, UNABLE TO PERFORM TEST 0.86* 0.67*   BNP: BNP (last 3 results) No results for input(s): BNP in the last 8760 hours.  ProBNP (last 3 results) No results for input(s): PROBNP in the last 8760 hours.  CBG: No  results for input(s): GLUCAP in the last 168 hours.   Addendum to the discharge summary done by Dr. Nevada Crane (addendum done on 03/24/2019):  Patient was not discharged back home yesterday due to the needs/equipment that patient will need at home.  Discussed with the case manager earlier today.  Home needs are in place.  No new changes in patient's clinical condition.  Patient was already deemed fit for discharge as per Dr. Irene Pap.  Will proceed with discharge as already outlined by Dr. Irene Pap.  Signed:  Bonnell Public, MD [facility] 03/24/2019, 10:34 AM

## 2019-03-24 NOTE — Progress Notes (Signed)
Discharged home by ambulance, discharge instructions and prescription given to ambulance staff. Called daughter Carlyon Shadow to go through discharge instructions but claimed to call back,

## 2019-03-25 ENCOUNTER — Telehealth: Payer: Self-pay | Admitting: *Deleted

## 2019-03-25 NOTE — Telephone Encounter (Signed)
I called patient due to on TCM hospital f/u list. I spoke to his daughter, Robert Barnett to see about scheduling virtual visit with PCP due to COVID-19 pandemic.  She is staying with patient.  Admit date: 03/17/2019 Discharge date: 03/24/2019   Transition Care Management Follow-up Telephone Call  How have you been since you were released from the hospital? Per daughter, Robert Barnett patient is Not responding or communicating like usual.    Do you understand why you were in the hospital?   Do you understand the discharge instrcutions? Yes Items Reviewed:  Medications reviewed: Yes, daughter states his Colchicine was d/c'd. She states she has continued giving him this medication as prescribed due to his foot pain/edema.   Allergies reviewed: yes  Dietary changes reviewed: Yes Referrals reviewed:   Functional Questionnaire:  Activities of Daily Living (ADLs):   Daugher states they are independent in the following: None States they require assistance with the following: All ADLs  Any transportation issues/concerns?: yes, per daughter, EMS had to bring patient home from Hospital  Any patient concerns? Confirmed importance and date/time of follow-up visits scheduled: Patient's daughter is willing to do virtual visit with PCP and patient present, if needed. However, patient's daughter states patient is lethargic. She states ST, OT and nurse came today and observed and evaluated patient. They will f/u with daughter, Robert Barnett. Advised patient I will call her back if PCP suggests virtual visit. She states she can only do the visit after 03/29/19 Tuesday of next week.   Confirmed with patient if condition begins to worsen call PCP or go to the ER.  Patient was given the Call-a-Nurse line [phone]: Yes

## 2019-03-28 ENCOUNTER — Telehealth: Payer: Self-pay | Admitting: Internal Medicine

## 2019-03-28 NOTE — Telephone Encounter (Signed)
Spoke to [place] and gave verbal okay for Speech as requested.

## 2019-03-28 NOTE — Telephone Encounter (Signed)
Copied from [place] [id number]. Topic: Quick Communication - Home Health Verbal Orders >> Mar 28, 2019  3:10 PM Rayann Heman wrote: Caller[facility]Agency: [facility]  Callback Number: [phone] Requesting: Speech Therapy Frequency:1x1 2x2

## 2019-03-29 ENCOUNTER — Other Ambulatory Visit: Payer: Self-pay | Admitting: Internal Medicine

## 2019-03-29 ENCOUNTER — Telehealth: Payer: Self-pay | Admitting: Internal Medicine

## 2019-03-29 NOTE — Telephone Encounter (Signed)
Copied from [place] [phone]. Topic: Quick Communication - Home Health Verbal Orders >> Mar 29, 2019  7:17 AM Rayann Heman wrote: Caller/AgencyWarren Lacy [place] Number: [phone] Requesting PT Frequency: 2x4

## 2019-03-29 NOTE — Telephone Encounter (Signed)
Left voicemail advising dr Ronnald Ramp ok orders for PT, call back if any further questions

## 2019-04-05 ENCOUNTER — Telehealth: Payer: Self-pay | Admitting: Internal Medicine

## 2019-04-05 NOTE — Telephone Encounter (Signed)
Routing to dr jones, please advise, thanks 

## 2019-04-05 NOTE — Telephone Encounter (Signed)
Yes, I am okay with this 

## 2019-04-05 NOTE — Telephone Encounter (Signed)
Advised shannon/[facility] ok orders from dr Ronnald Ramp

## 2019-04-05 NOTE — Telephone Encounter (Signed)
Copied from [place] [phone]. Topic: Quick Communication - Home Health Verbal Orders >> Apr 05, 2019 10:52 AM Jodie Echevaria wrote: Caller/Agency: Larene Beach / [place] Number: [phone] / Ok to [facility] Requesting OT/PT/Skilled Nursing/Social Work/Speech Therapy: Home health Skilled nursing Frequency: once a week for 3 weeks for continued wound care and drainage

## 2019-04-12 ENCOUNTER — Other Ambulatory Visit: Payer: Self-pay | Admitting: Internal Medicine

## 2019-04-12 DIAGNOSIS — H539 Unspecified visual disturbance: Secondary | ICD-10-CM

## 2019-04-12 DIAGNOSIS — I6300 Cerebral infarction due to thrombosis of unspecified precerebral artery: Secondary | ICD-10-CM

## 2019-04-12 DIAGNOSIS — I69398 Other sequelae of cerebral infarction: Secondary | ICD-10-CM

## 2019-04-18 ENCOUNTER — Other Ambulatory Visit: Payer: Self-pay | Admitting: Internal Medicine

## 2019-04-18 DIAGNOSIS — D520 Dietary folate deficiency anemia: Secondary | ICD-10-CM

## 2019-04-18 MED ORDER — AMLODIPINE BESYLATE 5 MG PO TABS: 5.0000 mg | ORAL_TABLET | Freq: Every day | ORAL | 1 refills | Status: DC

## 2019-04-18 MED ORDER — AMLODIPINE BESYLATE 5 MG PO TABS: 5.0000 mg | ORAL_TABLET | Freq: Every day | ORAL | 1 refills | Status: AC

## 2019-04-18 NOTE — Telephone Encounter (Signed)
Requested medication (s) are due for refill today: Yes  Requested medication (s) are on the active medication list: Yes  Last refill:  03/21/19  Future visit scheduled: No  Notes to clinic:  Unable to refill, last refilled by another provider     Requested Prescriptions  Pending Prescriptions Disp Refills   amLODipine (NORVASC) 5 MG tablet 30 tablet 0    Sig: Take 1 tablet (5 mg total) by mouth daily.     Cardiovascular:  Calcium Channel Blockers Failed - 04/18/2019  3:26 PM      Failed - Last BP in normal range    BP Readings from Last 1 Encounters:  03/24/19 (!) 117/104         Passed - Valid encounter within last 6 months    Recent Outpatient Visits          5 months ago Mass of breast, right   [place], MD   8 months ago Cachexia [facility])   [place], Thomas L, MD   10 months ago Acquired hypothyroidism   [place], Thomas L, MD   10 months ago Bowel and bladder incontinence   [facility] -Georges Mouse, MD   1 year ago Eczema, unspecified type   [place], Thomas L, MD

## 2019-04-21 ENCOUNTER — Telehealth: Payer: Self-pay

## 2019-04-21 NOTE — Telephone Encounter (Signed)
Yes, they can

## 2019-04-21 NOTE — Telephone Encounter (Signed)
Copied from [place] [phone]. Topic: Quick Communication - Home Health Verbal Orders >> Apr 21, 2019  9:50 AM Ivar Drape wrote: Caller/Agency: Cheral Marker w/[facility] Callback Number:   [phone] (family telephone #)   Amy is reporting that the patient is having problems with diarrhea and she wants to know if the family can give him some Imodium over the counter.  Please advise, and please call the family back with the provider's answer.

## 2019-04-21 NOTE — Telephone Encounter (Signed)
Pt family wanted to make sure that they can give pt immodium for diarrhea?

## 2019-04-21 NOTE — Telephone Encounter (Signed)
Spoke to [employer] and informed that it is okay to give patient Immodium.  Darlene stated understanding.

## 2019-04-26 ENCOUNTER — Telehealth: Payer: Self-pay | Admitting: Internal Medicine

## 2019-04-26 NOTE — Telephone Encounter (Signed)
Copied from [place] [phone]. Topic: Quick Communication - Home Health Verbal Orders >> Apr 26, 2019  3:50 PM Yvette Rack wrote: Caller/Agency: Larene Beach with [place] Number: [phone] Requesting OT/PT/Skilled Nursing/Social Work/Speech Therapy: skilled nursing Frequency: continue for 1 time a week for 3 weeks for drain management

## 2019-04-27 NOTE — Telephone Encounter (Signed)
Spoke to [place] with [facility] and gave verbal okay for orders requested.

## 2019-05-02 ENCOUNTER — Encounter (HOSPITAL_COMMUNITY): Payer: Self-pay

## 2019-05-02 ENCOUNTER — Other Ambulatory Visit: Payer: Self-pay

## 2019-05-02 ENCOUNTER — Ambulatory Visit: Payer: Self-pay | Admitting: *Deleted

## 2019-05-02 ENCOUNTER — Ambulatory Visit: Payer: Medicare Other | Admitting: Neurology

## 2019-05-02 ENCOUNTER — Emergency Department (HOSPITAL_COMMUNITY): Payer: Medicare Other

## 2019-05-02 ENCOUNTER — Inpatient Hospital Stay (HOSPITAL_COMMUNITY)
Admission: EM | Admit: 2019-05-02 | Discharge: 2019-05-30 | DRG: 871 | Disposition: E | Payer: Medicare Other | Attending: Internal Medicine | Admitting: Internal Medicine

## 2019-05-02 DIAGNOSIS — E039 Hypothyroidism, unspecified: Secondary | ICD-10-CM | POA: Diagnosis not present

## 2019-05-02 DIAGNOSIS — R7401 Elevation of levels of liver transaminase levels: Secondary | ICD-10-CM

## 2019-05-02 DIAGNOSIS — N184 Chronic kidney disease, stage 4 (severe): Secondary | ICD-10-CM | POA: Diagnosis not present

## 2019-05-02 DIAGNOSIS — T17908A Unspecified foreign body in respiratory tract, part unspecified causing other injury, initial encounter: Secondary | ICD-10-CM

## 2019-05-02 DIAGNOSIS — Z7902 Long term (current) use of antithrombotics/antiplatelets: Secondary | ICD-10-CM

## 2019-05-02 DIAGNOSIS — F0150 Vascular dementia without behavioral disturbance: Secondary | ICD-10-CM | POA: Diagnosis not present

## 2019-05-02 DIAGNOSIS — N179 Acute kidney failure, unspecified: Secondary | ICD-10-CM | POA: Diagnosis present

## 2019-05-02 DIAGNOSIS — Z8673 Personal history of transient ischemic attack (TIA), and cerebral infarction without residual deficits: Secondary | ICD-10-CM | POA: Diagnosis not present

## 2019-05-02 DIAGNOSIS — Z7982 Long term (current) use of aspirin: Secondary | ICD-10-CM

## 2019-05-02 DIAGNOSIS — Z515 Encounter for palliative care: Secondary | ICD-10-CM | POA: Diagnosis not present

## 2019-05-02 DIAGNOSIS — R197 Diarrhea, unspecified: Secondary | ICD-10-CM

## 2019-05-02 DIAGNOSIS — I2510 Atherosclerotic heart disease of native coronary artery without angina pectoris: Secondary | ICD-10-CM | POA: Diagnosis present

## 2019-05-02 DIAGNOSIS — A0472 Enterocolitis due to Clostridium difficile, not specified as recurrent: Secondary | ICD-10-CM | POA: Diagnosis not present

## 2019-05-02 DIAGNOSIS — I469 Cardiac arrest, cause unspecified: Secondary | ICD-10-CM | POA: Diagnosis not present

## 2019-05-02 DIAGNOSIS — R740 Nonspecific elevation of levels of transaminase and lactic acid dehydrogenase [LDH]: Secondary | ICD-10-CM | POA: Diagnosis present

## 2019-05-02 DIAGNOSIS — A419 Sepsis, unspecified organism: Secondary | ICD-10-CM | POA: Diagnosis present

## 2019-05-02 DIAGNOSIS — N400 Enlarged prostate without lower urinary tract symptoms: Secondary | ICD-10-CM | POA: Diagnosis present

## 2019-05-02 DIAGNOSIS — E785 Hyperlipidemia, unspecified: Secondary | ICD-10-CM | POA: Diagnosis present

## 2019-05-02 DIAGNOSIS — Z7189 Other specified counseling: Secondary | ICD-10-CM | POA: Diagnosis not present

## 2019-05-02 DIAGNOSIS — Z66 Do not resuscitate: Secondary | ICD-10-CM | POA: Diagnosis not present

## 2019-05-02 DIAGNOSIS — I739 Peripheral vascular disease, unspecified: Secondary | ICD-10-CM | POA: Diagnosis present

## 2019-05-02 DIAGNOSIS — J181 Lobar pneumonia, unspecified organism: Secondary | ICD-10-CM | POA: Diagnosis not present

## 2019-05-02 DIAGNOSIS — I700 Atherosclerosis of aorta: Secondary | ICD-10-CM | POA: Diagnosis present

## 2019-05-02 DIAGNOSIS — R1310 Dysphagia, unspecified: Secondary | ICD-10-CM | POA: Diagnosis present

## 2019-05-02 DIAGNOSIS — Z6825 Body mass index (BMI) 25.0-25.9, adult: Secondary | ICD-10-CM

## 2019-05-02 DIAGNOSIS — K632 Fistula of intestine: Secondary | ICD-10-CM | POA: Diagnosis not present

## 2019-05-02 DIAGNOSIS — E46 Unspecified protein-calorie malnutrition: Secondary | ICD-10-CM | POA: Diagnosis present

## 2019-05-02 DIAGNOSIS — Z87891 Personal history of nicotine dependence: Secondary | ICD-10-CM | POA: Diagnosis not present

## 2019-05-02 DIAGNOSIS — R0902 Hypoxemia: Secondary | ICD-10-CM | POA: Diagnosis not present

## 2019-05-02 DIAGNOSIS — Z20828 Contact with and (suspected) exposure to other viral communicable diseases: Secondary | ICD-10-CM | POA: Diagnosis not present

## 2019-05-02 DIAGNOSIS — R748 Abnormal levels of other serum enzymes: Secondary | ICD-10-CM | POA: Diagnosis present

## 2019-05-02 DIAGNOSIS — E860 Dehydration: Secondary | ICD-10-CM | POA: Diagnosis present

## 2019-05-02 DIAGNOSIS — J189 Pneumonia, unspecified organism: Secondary | ICD-10-CM

## 2019-05-02 DIAGNOSIS — Z79890 Hormone replacement therapy (postmenopausal): Secondary | ICD-10-CM

## 2019-05-02 DIAGNOSIS — M1990 Unspecified osteoarthritis, unspecified site: Secondary | ICD-10-CM | POA: Diagnosis not present

## 2019-05-02 DIAGNOSIS — J690 Pneumonitis due to inhalation of food and vomit: Secondary | ICD-10-CM | POA: Diagnosis present

## 2019-05-02 DIAGNOSIS — I129 Hypertensive chronic kidney disease with stage 1 through stage 4 chronic kidney disease, or unspecified chronic kidney disease: Secondary | ICD-10-CM | POA: Diagnosis not present

## 2019-05-02 DIAGNOSIS — I723 Aneurysm of iliac artery: Secondary | ICD-10-CM | POA: Diagnosis present

## 2019-05-02 DIAGNOSIS — R627 Adult failure to thrive: Secondary | ICD-10-CM | POA: Diagnosis present

## 2019-05-02 DIAGNOSIS — E861 Hypovolemia: Secondary | ICD-10-CM | POA: Diagnosis present

## 2019-05-02 DIAGNOSIS — K8020 Calculus of gallbladder without cholecystitis without obstruction: Secondary | ICD-10-CM | POA: Diagnosis present

## 2019-05-02 DIAGNOSIS — R6520 Severe sepsis without septic shock: Secondary | ICD-10-CM | POA: Diagnosis present

## 2019-05-02 LAB — COMPREHENSIVE METABOLIC PANEL
ALT: 136 U/L — ABNORMAL HIGH (ref 0–44)
AST: 150 U/L — ABNORMAL HIGH (ref 15–41)
Albumin: 2.5 g/dL — ABNORMAL LOW (ref 3.5–5.0)
Alkaline Phosphatase: 151 U/L — ABNORMAL HIGH (ref 38–126)
Anion gap: 13 (ref 5–15)
BUN: 48 mg/dL — ABNORMAL HIGH (ref 8–23)
CO2: 20 mmol/L — ABNORMAL LOW (ref 22–32)
Calcium: 7.9 mg/dL — ABNORMAL LOW (ref 8.9–10.3)
Chloride: 108 mmol/L (ref 98–111)
Creatinine, Ser: 2.93 mg/dL — ABNORMAL HIGH (ref 0.61–1.24)
GFR calc Af Amer: 22 mL/min — ABNORMAL LOW (ref >60)
GFR calc non Af Amer: 19 mL/min — ABNORMAL LOW (ref >60)
Glucose, Bld: 79 mg/dL (ref 70–99)
Potassium: 3.4 mmol/L — ABNORMAL LOW (ref 3.5–5.1)
Sodium: 141 mmol/L (ref 135–145)
Total Bilirubin: 1.5 mg/dL — ABNORMAL HIGH (ref 0.3–1.2)
Total Protein: 6.8 g/dL (ref 6.5–8.1)

## 2019-05-02 LAB — CBC WITH DIFFERENTIAL/PLATELET
Abs Immature Granulocytes: 0.04 10*3/uL (ref 0.00–0.07)
Basophils Absolute: 0 10*3/uL (ref 0.0–0.1)
Basophils Relative: 2 %
Eosinophils Absolute: 0 10*3/uL (ref 0.0–0.5)
Eosinophils Relative: 0 %
HCT: 34.3 % — ABNORMAL LOW (ref 39.0–52.0)
Hemoglobin: 11.5 g/dL — ABNORMAL LOW (ref 13.0–17.0)
Immature Granulocytes: 2 %
Lymphocytes Relative: 11 %
Lymphs Abs: 0.3 10*3/uL — ABNORMAL LOW (ref 0.7–4.0)
MCH: 35 pg — ABNORMAL HIGH (ref 26.0–34.0)
MCHC: 33.5 g/dL (ref 30.0–36.0)
MCV: 104.3 fL — ABNORMAL HIGH (ref 80.0–100.0)
Monocytes Absolute: 1 10*3/uL (ref 0.1–1.0)
Monocytes Relative: 37 %
Neutro Abs: 1.3 10*3/uL — ABNORMAL LOW (ref 1.7–7.7)
Neutrophils Relative %: 48 %
Platelets: 135 10*3/uL — ABNORMAL LOW (ref 150–400)
RBC: 3.29 MIL/uL — ABNORMAL LOW (ref 4.22–5.81)
RDW: 21.7 % — ABNORMAL HIGH (ref 11.5–15.5)
WBC: 2.7 10*3/uL — ABNORMAL LOW (ref 4.0–10.5)
nRBC: 2.6 % — ABNORMAL HIGH (ref 0.0–0.2)

## 2019-05-02 LAB — LACTIC ACID, PLASMA: Lactic Acid, Venous: 2.5 mmol/L (ref 0.5–1.9)

## 2019-05-02 LAB — SARS CORONAVIRUS 2 BY RT PCR (HOSPITAL ORDER, PERFORMED IN ~~LOC~~ HOSPITAL LAB): SARS Coronavirus 2: NEGATIVE

## 2019-05-02 MED ORDER — SODIUM CHLORIDE 0.9 % IV BOLUS
1000.0000 mL | Freq: Once | INTRAVENOUS | Status: AC
  Administered 2019-05-02: 1000 mL via INTRAVENOUS

## 2019-05-02 MED ORDER — SODIUM CHLORIDE 0.9 % IV SOLN
2.0000 g | INTRAVENOUS | Status: DC
  Administered 2019-05-02 – 2019-05-03 (×2): 2 g via INTRAVENOUS
  Filled 2019-05-02 (×3): qty 2

## 2019-05-02 MED ORDER — VANCOMYCIN HCL 10 G IV SOLR
1500.0000 mg | Freq: Once | INTRAVENOUS | Status: AC
  Administered 2019-05-02: 1500 mg via INTRAVENOUS
  Filled 2019-05-02 (×2): qty 1500

## 2019-05-02 MED ORDER — VANCOMYCIN HCL IN DEXTROSE 1-5 GM/200ML-% IV SOLN
1000.0000 mg | INTRAVENOUS | Status: DC
  Filled 2019-05-02: qty 200

## 2019-05-02 NOTE — ED Notes (Signed)
Evalee Jefferson, PA notified of pt's BP of 85/63.

## 2019-05-02 NOTE — Telephone Encounter (Signed)
Do you agree with ED disposition?

## 2019-05-02 NOTE — ED Notes (Signed)
CRITICAL VALUE ALERT  Critical Value:  Lactic 2.5  Date & Time Notied:  05/13/2019 2044  Provider Notified: Evalee Jefferson, PA  Orders Received/Actions taken: No orders at this time

## 2019-05-02 NOTE — ED Notes (Addendum)
Diarrhea for the last 10 days. Had an infection in his stomach at was admitted to [facility]. Pt is from home. Is also complaining of tenderness is him bottom as well. Pt's mouth is very dry

## 2019-05-02 NOTE — ED Triage Notes (Signed)
Pt brought in by [facility] from home. Daughter called and reported pt has had diarrhea, abdominal pain, weakness, decreased appetite x 1 week. Denies fever.

## 2019-05-02 NOTE — Progress Notes (Signed)
Pharmacy Note:  Initial antibiotic(s) regimen of Vancomycin ordered by EDP to treat Pneumonia.  CrCl cannot be calculated (Patient's most recent lab result is older than the maximum 21 days allowed.).   No Known Allergies  Vitals:   05/07/2019 1900 05/27/2019 1930  BP: (!) 85/59 (!) 105/56  Pulse: (!) 102 93  Resp:  (!) 26  Temp:    SpO2: 95% 96%     Plan: Initial dose(s) of Vancomycin 1500mg  IV X 1 ordered. F/U admission orders for further dosing if therapy continued.  Cristy Friedlander, [facility] 05/08/2019 8:11 PM

## 2019-05-02 NOTE — Telephone Encounter (Signed)
Pt's daughter 'Darlene' calling,on DPR,  pt present during call.  Reports pt hospitalized 03/17/2019, "Diarrhea, weakness." States has had diarrheal episodes "Off and on since then." States has been taking imodium, when off, reoccurs. States severe diarrhea Thursday-Sunday, 4 episodes today. States stools are watery, pt is incontinent. Reports drinking very little, increased coughing with drinking, "Sounds like he gurgles when drinking." States has been unable to ambulate since Friday, "Usually walks with a walker but my brother has had to carry him to the bathroom." States urinating less, unsure amount  as incontinent with brief.Has not been eating. Afebrile. Had c/o abdominal pain over weekend, none presently.  Pt directed to ED; daughter would like Dr. Ronnald Ramp recommendation. "I don't want to take him to the hospital with everything going on there and I can't stay with him." [place] called practice, spoke with [facility]. Will route to practice as instructed. Pt's daughter aware.   Reason for Disposition . [1] Drinking very little AND [2] dehydration suspected (e.g., no urine > 12 hours, very dry mouth, very lightheaded)  Answer Assessment - Initial Assessment Questions 1. DIARRHEA SEVERITY: "How bad is the diarrhea?" "How many extra stools have you had in the past 24 hours than normal?"    - NO DIARRHEA (SCALE 0)   - MILD (SCALE 1-3): Few loose or mushy BMs; increase of 1-3 stools over normal daily number of stools; mild increase in ostomy output.   -  MODERATE (SCALE 4-7): Increase of 4-6 stools daily over normal; moderate increase in ostomy output. * SEVERE (SCALE 8-10; OR 'WORST POSSIBLE'): Increase of 7 or more stools daily over normal; moderate increase in ostomy output; incontinence.     3 episodes today 2. ONSET: "When did the diarrhea begin?"      Off and on 3. BM CONSISTENCY: "How loose or watery is the diarrhea?"      watery 4. VOMITING: "Are you also vomiting?" If so, ask: "How many times in the  past 24 hours?"      no 5. ABDOMINAL PAIN: "Are you having any abdominal pain?" If yes: "What does it feel like?" (e.g., crampy, dull, intermittent, constant)      Not presently 6. ABDOMINAL PAIN SEVERITY: If present, ask: "How bad is the pain?"  (e.g., Scale 1-10; mild, moderate, or severe)   - MILD (1-3): doesn't interfere with normal activities, abdomen soft and not tender to touch    - MODERATE (4-7): interferes with normal activities or awakens from sleep, tender to touch    - SEVERE (8-10): excruciating pain, doubled over, unable to do any normal activities       Moderate today, "Worse past 3 days" 7. ORAL INTAKE: If vomiting, "Have you been able to drink liquids?" "How much fluids have you had in the past 24 hours?"     Few sips 8. HYDRATION: "Any signs of dehydration?" (e.g., dry mouth [not just dry lips], too weak to stand, dizziness, new weight loss) "When did you last urinate?"     Unsure, probably less, wears depends 9. EXPOSURE: "Have you traveled to a foreign country recently?" "Have you been exposed to anyone with diarrhea?" "Could you have eaten any food that was spoiled?"     no 10. ANTIBIOTIC USE: "Are you taking antibiotics now or have you taken antibiotics in the past 2 months?"      no 11. OTHER SYMPTOMS: "Do you have any other symptoms?" (e.g., fever, blood in stool)     Weaker, unable to ambulate past 3  days.  Protocols used: DIARRHEA-A-AH

## 2019-05-02 NOTE — Telephone Encounter (Signed)
Spoke to [employer] and informed PCP agreed with the disposition to go ED. Darlene stated that they were not able to transport. Advised that EMS would need to be contacted and they will transport to [facility]. Darlene requested that we let [facility] know. I agreed but informed that pt would be triaged upon arrival.   Spoke to [place] at [facility] ED and informed that pt is on his was via EMS.

## 2019-05-02 NOTE — ED Provider Notes (Signed)
[facility] EMERGENCY DEPARTMENT Provider Note   CSN: [id number] Arrival date & time: 05/07/2019  1757    History   Chief Complaint Chief Complaint  Patient presents with  . Diarrhea    HPI Robert Barnett is a 85 y.o. male with a history as outlined below, most significant for hypertension, chronic kidney disease, dementia, history of stroke presenting with a one-week history of worsening diarrhea and poor p.o. intake.  Patient is unable to give much history given his chronic dementia, much history was given by his daughter Swan Zayed per phone who is a primary caregiver, stating he has had very little PO intake this week along with progressing nonbloody watery diarrhea, reporting the past 2 days having a bm approximately every 2 hours.  He was given imodium and pepto bismol yesterday without improvement.  He has had no vomiting but has had some intermittent complaint of abdominal discomfort.  He was admitted with similar symptoms 2 months ago, diagnosed with enteropathic e coli (negative C dif) but was also treated for pneumonia during that hospitalization with Meropenem.  No more recent abx per dg.  Afebrile at home.        The history is provided by the patient and a relative (Daughter). The history is limited by the condition of the patient.    Past Medical History:  Diagnosis Date  . Arthritis   . BPH (benign prostatic hyperplasia)   . Carotid artery occlusion   . Dementia ([place])   . Dysphagia   . Hyperlipidemia   . Hypertension   . Peripheral vascular disease ([place])   . Stroke [facility])    Right hemispheric CVA  . Umbilical hernia     Patient Active Problem List   Diagnosis Date Noted  . Goals of care, counseling/discussion   . Palliative care encounter   . CAD (coronary artery disease), native coronary artery 03/19/2019  . Mental status alteration 03/19/2019  . Fever 03/19/2019  . Diarrhea 03/19/2019  . Elevated troponin 03/19/2019  . AKI (acute kidney injury)  ([place]) 03/17/2019  . Mass of breast, right 11/17/2018  . Cachexia ([place]) 08/10/2018  . History of CVA (cerebrovascular accident) 06/14/2018  . Chronic idiopathic constipation 06/14/2018  . Dietary folate deficiency anemia 06/14/2018  . Iron deficiency anemia due to dietary causes 06/14/2018  . Bowel and bladder incontinence 05/27/2018  . Pancreatic cyst   . Eczema 03/31/2018  . 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 CKD (chronic kidney disease) stage 4, GFR 15-29 ml/min (HCC) 09/03/2017  . Essential hypertension 07/30/2017  . Abdominal aortic atherosclerosis ([place]) 08/19/2016  . Bilateral low back pain without sciatica 08/18/2016  . Hypothyroidism 07/23/2016  . Moderate dementia without behavioral disturbance ([place]) 07/23/2016  . Hyperlipidemia 11/17/2014  . BPH (benign prostatic hyperplasia)     Past Surgical History:  Procedure Laterality Date  . CAROTID ENDARTERECTOMY  10/06/2007   Right CEA by Dr. Amedeo Plenty  . IR CATHETER TUBE CHANGE  09/15/2017  . IR CATHETER TUBE CHANGE  12/03/2017  . IR CATHETER TUBE CHANGE  12/23/2017  . IR CATHETER TUBE CHANGE  05/07/2018  . IR CATHETER TUBE CHANGE  06/29/2018  . IR CATHETER TUBE CHANGE  08/18/2018  . IR CATHETER TUBE CHANGE  09/08/2018  . IR CATHETER TUBE CHANGE  10/15/2018  . IR CATHETER TUBE CHANGE  01/26/2019  . IR GENERIC HISTORICAL  07/09/2016   IR RADIOLOGIST EVAL & MGMT 07/09/2016 Arne Cleveland, MD [facility] INTERV RAD  . IR RADIOLOGIST EVAL & MGMT  09/15/2017  .  IR RADIOLOGIST EVAL & MGMT  05/06/2018  . PEG TUBE PLACEMENT  2008   after CVA        Home Medications    Prior to Admission medications   Medication Sig Start Date End Date Taking? Authorizing Provider  amLODipine (NORVASC) 5 MG tablet Take 1 tablet (5 mg total) by mouth daily. 04/18/19   Janith Lima, MD  aspirin 81 MG tablet Take 1 tablet (81 mg total) by mouth daily. 06/14/18   Janith Lima, MD  chlorhexidine (PERIDEX) 0.12 % solution 15 mLs by Mouth Rinse route 2 (two) times  daily. 03/23/19   Kayleen Memos, DO  clopidogrel (PLAVIX) 75 MG tablet TAKE 1 TABLET BY MOUTH EVERY DAY 04/12/19   Janith Lima, MD  donepezil (ARICEPT) 10 MG tablet Take 1 tablet by mouth at bedtime. 12/28/18   [provider]  feeding supplement, ENSURE ENLIVE, (ENSURE ENLIVE) LIQD Take 237 mLs by mouth 2 (two) times daily between meals. 03/23/19   Kayleen Memos, DO  ferrous sulfate 325 (65 FE) MG EC tablet TAKE 1 TABLET BY MOUTH EVERY DAY WITH BREAKFAST 12/08/18   Janith Lima, MD  folic acid (FOLVITE) 1 MG tablet TAKE 1 TABLET BY MOUTH EVERY DAY Patient not taking: Reported on 03/17/2019 12/08/18   Janith Lima, MD  Hydrocortisone (GERHARDT'S BUTT CREAM) CREA Apply 1 application topically 2 (two) times daily. 03/23/19   Kayleen Memos, DO  levothyroxine (SYNTHROID, LEVOTHROID) 50 MCG tablet TAKE 1 TABLET (50 MCG TOTAL) BY MOUTH DAILY. 12/30/18   Janith Lima, MD  simvastatin (ZOCOR) 20 MG tablet TAKE 1 TABLET BY MOUTH EVERY DAY 03/29/19   Janith Lima, MD  thiamine 100 MG tablet TAKE 1 TABLET BY MOUTH EVERY DAY 01/03/19   Janith Lima, MD  triamcinolone cream (KENALOG) 0.5 % Apply 1 application topically 2 (two) times daily. 03/31/18   Janith Lima, MD    Family History Family History  Problem Relation Age of Onset  . Obesity Daughter     Social History Social History   Tobacco Use  . Smoking status: Former Research scientist (life sciences)  . Smokeless tobacco: Never Used  . Tobacco comment: remote h/o  Substance Use Topics  . Alcohol use: No  . Drug use: No     Allergies   Patient has no known allergies.   Review of Systems Review of Systems  Constitutional: Positive for appetite change. Negative for fever.  HENT: Negative for congestion and sore throat.   Eyes: Negative.   Respiratory: Negative for chest tightness and shortness of breath.   Cardiovascular: Negative for chest pain.  Gastrointestinal: Positive for diarrhea. Negative for abdominal pain and nausea.   Genitourinary: Negative.  Negative for decreased urine volume.  Musculoskeletal: Negative for arthralgias, joint swelling and neck pain.  Skin: Negative.  Negative for rash and wound.  Neurological: Negative for dizziness, weakness, light-headedness, numbness and headaches.  Psychiatric/Behavioral: Negative.      Physical Exam Updated Vital Signs BP (!) 80/54   Pulse 93   Temp (!) 100.7 F (38.2 C) (Axillary)   Resp 17   Ht 6\' 1"  (1.854 m)   Wt 86.2 kg   SpO2 93%   BMI 25.07 kg/m   Physical Exam Constitutional:      General: He is not in acute distress. HENT:     Mouth/Throat:     Mouth: Mucous membranes are dry.  Cardiovascular:     Rate and Rhythm: Tachycardia present.  Pulses: Normal pulses.  Pulmonary:     Breath sounds: Normal breath sounds.  Abdominal:     General: There is no distension.     Tenderness: There is no abdominal tenderness. There is no guarding or rebound.     Comments: Abdomen soft and nontender.  Neurological:     Mental Status: He is alert. He is confused.      ED Treatments / Results  Labs (all labs ordered are listed, but only abnormal results are displayed) Labs Reviewed  CBC WITH DIFFERENTIAL/PLATELET - Abnormal; Notable for the following components:      Result Value   WBC 2.7 (*)    RBC 3.29 (*)    Hemoglobin 11.5 (*)    HCT 34.3 (*)    MCV 104.3 (*)    MCH 35.0 (*)    RDW 21.7 (*)    Platelets 135 (*)    nRBC 2.6 (*)    Neutro Abs 1.3 (*)    Lymphs Abs 0.3 (*)    All other components within normal limits  COMPREHENSIVE METABOLIC PANEL - Abnormal; Notable for the following components:   Potassium 3.4 (*)    CO2 20 (*)    BUN 48 (*)    Creatinine, Ser 2.93 (*)    Calcium 7.9 (*)    Albumin 2.5 (*)    AST 150 (*)    ALT 136 (*)    Alkaline Phosphatase 151 (*)    Total Bilirubin 1.5 (*)    GFR calc non Af Amer 19 (*)    GFR calc Af Amer 22 (*)    All other components within normal limits  LACTIC ACID, PLASMA -  Abnormal; Notable for the following components:   Lactic Acid, Venous 2.5 (*)    All other components within normal limits  CULTURE, BLOOD (ROUTINE X 2)  CULTURE, BLOOD (ROUTINE X 2)  C DIFFICILE QUICK SCREEN W PCR REFLEX  GASTROINTESTINAL PANEL BY PCR, STOOL (REPLACES STOOL CULTURE)  SARS CORONAVIRUS 2 (HOSPITAL ORDER, [place] [facility])    EKG None  Radiology Dg Chest Portable 1 View  Result Date: 05/03/2019 CLINICAL DATA:  Cough EXAM: PORTABLE CHEST 1 VIEW COMPARISON:  03/19/2019 FINDINGS: Consolidation in the left lower lobe compatible with pneumonia. Previously seen right lower lobe opacity has resolved. Heart is normal size. No visible effusions or acute bony abnormality. IMPRESSION: Worsening left lower lobe airspace opacity compatible with pneumonia. Resolved right lower lobe infiltrate. Electronically Signed   By: Rolm Baptise M.D.   On: 04/29/2019 19:24    Procedures Procedures (including critical care time)  Medications Ordered in ED Medications  vancomycin (VANCOCIN) 1,500 mg in sodium chloride 0.9 % 500 mL IVPB ( Intravenous Rate/Dose Verify 05/15/2019 2056)  sodium chloride 0.9 % bolus 1,000 mL (has no administration in time range)  sodium chloride 0.9 % bolus 1,000 mL (0 mLs Intravenous Stopped 05/24/2019 2029)  sodium chloride 0.9 % bolus 1,000 mL (1,000 mLs Intravenous New Bag/Given 05/24/2019 2006)     Initial Impression / Assessment and Plan / ED Course  I have reviewed the triage vital signs and the nursing notes.  Pertinent labs & imaging results that were available during my care of the patient were reviewed by me and considered in my medical decision making (see chart for details).       Pt with left sided pneumonia with sepsis, diarrhea and dehydration, acute on chronic renal failure.  Pt was given IV vancomycin based on his  cxr findings, HCAP. Stool cultures/c diff pending.  Sepsis protocol IV fluids also given with persistent hypotension.   Discussed with Dr. Darrick Meigs who accepts pt for admission.  Final Clinical Impressions(s) / ED Diagnoses   Final diagnoses:  Healthcare-associated pneumonia  Sepsis with acute organ dysfunction, due to unspecified organism, unspecified type, unspecified whether septic shock present ([place])  Diarrhea of presumed infectious origin  Acute kidney injury [facility])  Dehydration  Elevated liver enzymes    ED Discharge Orders    None       Landis Martins 05/03/19 0114    Noemi Chapel, MD 05/09/19 [phone]

## 2019-05-02 NOTE — ED Provider Notes (Signed)
Medical screening examination/treatment/procedure(s) were conducted as a shared visit with non-physician practitioner(s) and myself.  I personally evaluated the patient during the encounter.  Clinical Impression:   Final diagnoses:  Healthcare-associated pneumonia  Sepsis with acute organ dysfunction, due to unspecified organism, unspecified type, unspecified whether septic shock present ([place])  Diarrhea of presumed infectious origin  Acute kidney injury ([place])  Dehydration  Elevated liver enzymes    The patient is an ill-appearing 85 year old male who presents to the hospital after having recurrent infections, recently treated in the hospital for multiple problems 1 of the which was pneumonia and an acute kidney injury.  He presents today with worsening diarrhea and generalized weakness with very little to eat over the last 3 days.  Unfortunately the patient is unable to give much in the way of information, he is ill-appearing, febrile, tachypneic, hypotensive and has pneumonia on his x-ray.  Unfortunately he has a leukopenia (negative coronavirus), evidence of worsening pneumonia, worsening renal failure and some acute liver injury.  He has evidence of multiorgan system failure.  He has been treated with antibiotics, broad-spectrum, has been treated with IV fluid resuscitation, his lactic acid is elevated.  He will need to be admitted to the hospital to a higher level of care.  The patient is critically ill.  .Critical Care Performed by: Noemi Chapel, MD Authorized by: Noemi Chapel, MD   Critical care provider statement:    Critical care time (minutes):  35   Critical care time was exclusive of:  Separately billable procedures and treating other patients and teaching time   Critical care was necessary to treat or prevent imminent or life-threatening deterioration of the following conditions:  Sepsis   Critical care was time spent personally by me on the following activities:  Blood draw  for specimens, development of treatment plan with patient or surrogate, discussions with consultants, evaluation of patient's response to treatment, examination of patient, obtaining history from patient or surrogate, ordering and performing treatments and interventions, ordering and review of laboratory studies, ordering and review of radiographic studies, pulse oximetry, re-evaluation of patient's condition and review of old charts      Noemi Chapel, MD 05/09/19 [phone]

## 2019-05-02 NOTE — Telephone Encounter (Signed)
Yes, ED He may need fluids  TJ

## 2019-05-02 NOTE — Progress Notes (Signed)
Pharmacy Antibiotic Note  Robert Barnett is a 85 y.o. male admitted on 05/27/2019 with pneumonia.  Pharmacy has been consulted for Vancomycin and cefepime dosing.  Plan: Vancomycin 1500mg  IV loading dose, then 1000mg  IV every 24 hours.  Goal trough 15-20 mcg/mL.  Cefepime 2gm IV q24h F/U Cxs and clinical progress Monitor V/S, lab and levels as indicated  Height: 6\' 1"  (185.4 cm) Weight: 190 lb (86.2 kg) IBW/kg (Calculated) : 79.9  Temp (24hrs), Avg:100.7 F (38.2 C), Min:100.7 F (38.2 C), Max:100.7 F (38.2 C)  Recent Labs  Lab 05/03/2019 1849  WBC 2.7*  CREATININE 2.93*  LATICACIDVEN 2.5*    Estimated Creatinine Clearance: 21.2 mL/min (A) (by C-G formula based on SCr of 2.93 mg/dL (H)).    No Known Allergies  Antimicrobials this admission: Vancomycin 5/4 >>  Cefepime 5/4>>   Dose adjustments this admission: n/a  Microbiology results: 5/4 BCx: pending 5/4 SARS-2 CV is negative   MRSA PCR:   Thank you for allowing pharmacy to be a part of this patient's care.  Isac Sarna, BS [place], [place] Clinical Pharmacist Pager [phone] 05/23/2019 10:35 PM

## 2019-05-02 NOTE — H&P (Signed)
TRH H&P    Patient Demographics:    Robert Barnett, is a 85 y.o. male  MRN: [id number]  DOB - 01/17/1932  Admit Date - 05/01/2019  Referring MD/NP/PA: Evalee Jefferson  Outpatient Primary MD for the patient is Janith Lima, MD  Patient coming from: Home  Chief complaint-diarrhea   HPI:    Robert Barnett  is a 85 y.o. male, with history of dementia, CKD stage IV, BPH, hypothyroidism who was recently admitted to the hospital with diarrhea and pneumonia in March and discharged home.  At that time GI pathogen panel showed enteropathogenic E. coli as a cause of diarrhea.   Today was brought back to the hospital with similar complaints of diarrhea for past 3 days.  Has underlying history of dementia, so unable to provide significant history. 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  As per daughter, this gets changed every 2 to 3 months.  Patient has had poor p.o. intake for past few days.  Also has been coughing when he tries to drink water thin liquids.  Patient denies any chest pain or shortness of breath. He does have history of CVA  In the ED, chest x-ray showed left lower lobe infiltrate, elevated liver enzymes, acute kidney injury on CKD stage IV    Review of systems:    In addition to the HPI above,   All other systems reviewed and are negative.    Past History of the following :    Past Medical History:  Diagnosis Date  . Arthritis   . BPH (benign prostatic hyperplasia)   . Carotid artery occlusion   . Dementia ([place])   . Dysphagia   . Hyperlipidemia   . Hypertension   . Peripheral vascular disease ([place])   . Stroke [facility])    Right hemispheric CVA  . Umbilical hernia       Past Surgical History:  Procedure Laterality Date  . CAROTID ENDARTERECTOMY  10/06/2007   Right CEA by Dr. Amedeo Plenty  . IR CATHETER TUBE CHANGE  09/15/2017  . IR CATHETER TUBE CHANGE   12/03/2017  . IR CATHETER TUBE CHANGE  12/23/2017  . IR CATHETER TUBE CHANGE  05/07/2018  . IR CATHETER TUBE CHANGE  06/29/2018  . IR CATHETER TUBE CHANGE  08/18/2018  . IR CATHETER TUBE CHANGE  09/08/2018  . IR CATHETER TUBE CHANGE  10/15/2018  . IR CATHETER TUBE CHANGE  01/26/2019  . IR GENERIC HISTORICAL  07/09/2016   IR RADIOLOGIST EVAL & MGMT 07/09/2016 Arne Cleveland, MD [facility] INTERV RAD  . IR RADIOLOGIST EVAL & MGMT  09/15/2017  . IR RADIOLOGIST EVAL & MGMT  05/06/2018  . PEG TUBE PLACEMENT  2008   after CVA      Social History:      Social History   Tobacco Use  . Smoking status: Former Research scientist (life sciences)  . Smokeless tobacco: Never Used  . Tobacco comment: remote h/o  Substance Use Topics  . Alcohol use: No       Family History :  Family History  Problem Relation Age of Onset  . Obesity Daughter       Home Medications:   Prior to Admission medications   Medication Sig Start Date End Date Taking? Authorizing Provider  amLODipine (NORVASC) 5 MG tablet Take 1 tablet (5 mg total) by mouth daily. 04/18/19   Janith Lima, MD  aspirin 81 MG tablet Take 1 tablet (81 mg total) by mouth daily. 06/14/18   Janith Lima, MD  chlorhexidine (PERIDEX) 0.12 % solution 15 mLs by Mouth Rinse route 2 (two) times daily. 03/23/19   Kayleen Memos, DO  clopidogrel (PLAVIX) 75 MG tablet TAKE 1 TABLET BY MOUTH EVERY DAY 04/12/19   Janith Lima, MD  donepezil (ARICEPT) 10 MG tablet Take 1 tablet by mouth at bedtime. 12/28/18   [provider]  feeding supplement, ENSURE ENLIVE, (ENSURE ENLIVE) LIQD Take 237 mLs by mouth 2 (two) times daily between meals. 03/23/19   Kayleen Memos, DO  ferrous sulfate 325 (65 FE) MG EC tablet TAKE 1 TABLET BY MOUTH EVERY DAY WITH BREAKFAST 12/08/18   Janith Lima, MD  folic acid (FOLVITE) 1 MG tablet TAKE 1 TABLET BY MOUTH EVERY DAY Patient not taking: Reported on 03/17/2019 12/08/18   Janith Lima, MD  Hydrocortisone (GERHARDT'S BUTT CREAM) CREA  Apply 1 application topically 2 (two) times daily. 03/23/19   Kayleen Memos, DO  levothyroxine (SYNTHROID, LEVOTHROID) 50 MCG tablet TAKE 1 TABLET (50 MCG TOTAL) BY MOUTH DAILY. 12/30/18   Janith Lima, MD  simvastatin (ZOCOR) 20 MG tablet TAKE 1 TABLET BY MOUTH EVERY DAY 03/29/19   Janith Lima, MD  thiamine 100 MG tablet TAKE 1 TABLET BY MOUTH EVERY DAY 01/03/19   Janith Lima, MD  triamcinolone cream (KENALOG) 0.5 % Apply 1 application topically 2 (two) times daily. 03/31/18   Janith Lima, MD     Allergies:    No Known Allergies   Physical Exam:   Vitals  Blood pressure (!) 96/54, pulse 85, temperature (!) 100.7 F (38.2 C), temperature source Axillary, resp. rate (!) 21, height '6\' 1"'$  (1.854 m), weight 86.2 kg, SpO2 96 %.  1.  General: Appears in no acute distress  2. Psychiatric: Alert, not able to assess orientation as patient is poor historian  3. Neurologic: Cranial nerves II through grossly intact, moving all extremities  4. HEENMT:  Atraumatic normocephalic, extraocular muscles intact  5. Respiratory : Clear to auscultation bilaterally, no wheezing or crackles  6. Cardiovascular : S1-S2, regular, no murmur auscultated  7. Gastrointestinal:  Abdomen is soft, nontender, no organomegaly, pigtail catheter with stool bag noted in the left lower quadrant.  8. Skin:  No rashes noted   9.Musculoskeletal:  No limitation of range of movement in both upper lower extremities    Data Review:    CBC Recent Labs  Lab 05/17/2019 1849  WBC 2.7*  HGB 11.5*  HCT 34.3*  PLT 135*  MCV 104.3*  MCH 35.0*  MCHC 33.5  RDW 21.7*  LYMPHSABS 0.3*  MONOABS 1.0  EOSABS 0.0  BASOSABS 0.0   ------------------------------------------------------------------------------------------------------------------  Results for orders placed or performed during the hospital encounter of 05/20/2019 (from the past 48 hour(s))  CBC with Differential     Status: Abnormal   Collection  Time: 05/19/2019  6:49 PM  Result Value Ref Range   WBC 2.7 (L) 4.0 - 10.5 K/uL    Comment: WHITE COUNT CONFIRMED ON SMEAR   RBC 3.29 (  L) 4.22 - 5.81 MIL/uL   Hemoglobin 11.5 (L) 13.0 - 17.0 g/dL   HCT 34.3 (L) 39.0 - 52.0 %   MCV 104.3 (H) 80.0 - 100.0 fL   MCH 35.0 (H) 26.0 - 34.0 pg   MCHC 33.5 30.0 - 36.0 g/dL   RDW 21.7 (H) 11.5 - 15.5 %   Platelets 135 (L) 150 - 400 K/uL   nRBC 2.6 (H) 0.0 - 0.2 %   Neutrophils Relative % 48 %   Neutro Abs 1.3 (L) 1.7 - 7.7 K/uL   Lymphocytes Relative 11 %   Lymphs Abs 0.3 (L) 0.7 - 4.0 K/uL   Monocytes Relative 37 %   Monocytes Absolute 1.0 0.1 - 1.0 K/uL   Eosinophils Relative 0 %   Eosinophils Absolute 0.0 0.0 - 0.5 K/uL   Basophils Relative 2 %   Basophils Absolute 0.0 0.0 - 0.1 K/uL   WBC Morphology MILD LEFT SHIFT (1-5% METAS, OCC MYELO, OCC BANDS)    Immature Granulocytes 2 %   Abs Immature Granulocytes 0.04 0.00 - 0.07 K/uL    Comment: Performed at [facility], [address] [place], [place] [address]  Comprehensive metabolic panel     Status: Abnormal   Collection Time: 05/01/2019  6:49 PM  Result Value Ref Range   Sodium 141 135 - 145 mmol/L   Potassium 3.4 (L) 3.5 - 5.1 mmol/L   Chloride 108 98 - 111 mmol/L   CO2 20 (L) 22 - 32 mmol/L   Glucose, Bld 79 70 - 99 mg/dL   BUN 48 (H) 8 - 23 mg/dL   Creatinine, Ser 2.93 (H) 0.61 - 1.24 mg/dL   Calcium 7.9 (L) 8.9 - 10.3 mg/dL   Total Protein 6.8 6.5 - 8.1 g/dL   Albumin 2.5 (L) 3.5 - 5.0 g/dL   AST 150 (H) 15 - 41 U/L   ALT 136 (H) 0 - 44 U/L   Alkaline Phosphatase 151 (H) 38 - 126 U/L   Total Bilirubin 1.5 (H) 0.3 - 1.2 mg/dL   GFR calc non Af Amer 19 (L) >60 mL/min   GFR calc Af Amer 22 (L) >60 mL/min   Anion gap 13 5 - 15    Comment: Performed at [facility], [address] [place], [place] [address]  Lactic acid, plasma     Status: Abnormal   Collection Time: 05/19/2019  6:49 PM  Result Value Ref Range   Lactic Acid, Venous 2.5 (HH) 0.5 - 1.9 mmol/L    Comment: CRITICAL  RESULT CALLED TO, READ BACK BY AND VERIFIED WITH: LONG,H ON 05/01/2019 AT 2045 BY LOY,C Performed at [facility], [address] [place], [place] [address]   Blood culture (routine x 2)     Status: None (Preliminary result)   Collection Time: 05/03/2019  7:11 PM  Result Value Ref Range   Specimen Description BLOOD RIGHT HAND    Special Requests      BOTTLES DRAWN AEROBIC AND ANAEROBIC Blood Culture adequate volume Performed at [facility], [address], [place], [place] [address]    Culture PENDING    Report Status PENDING   Blood culture (routine x 2)     Status: None (Preliminary result)   Collection Time: 05/01/2019  7:12 PM  Result Value Ref Range   Specimen Description BLOOD LEFT HAND    Special Requests      BOTTLES DRAWN AEROBIC ONLY Blood Culture adequate volume Performed at [facility], [address], [place], [place] [address]  Culture PENDING    Report Status PENDING   SARS Coronavirus 2 (CEPHEID - Performed in [place] hospital lab), Hosp Order     Status: None   Collection Time: 05/16/2019  8:26 PM  Result Value Ref Range   SARS Coronavirus 2 NEGATIVE NEGATIVE    Comment: (NOTE) If result is NEGATIVE SARS-CoV-2 target nucleic acids are NOT DETECTED. The SARS-CoV-2 RNA is generally detectable in upper and lower  respiratory specimens during the acute phase of infection. The lowest  concentration of SARS-CoV-2 viral copies this assay can detect is 250  copies / mL. A negative result does not preclude SARS-CoV-2 infection  and should not be used as the sole basis for treatment or other  patient management decisions.  A negative result may occur with  improper specimen collection / handling, submission of specimen other  than nasopharyngeal swab, presence of viral mutation(s) within the  areas targeted by this assay, and inadequate number of viral copies  (<250 copies / mL). A negative result must be combined with clinical  observations, patient history, and  epidemiological information. If result is POSITIVE SARS-CoV-2 target nucleic acids are DETECTED. The SARS-CoV-2 RNA is generally detectable in upper and lower  respiratory specimens dur ing the acute phase of infection.  Positive  results are indicative of active infection with SARS-CoV-2.  Clinical  correlation with patient history and other diagnostic information is  necessary to determine patient infection status.  Positive results do  not rule out bacterial infection or co-infection with other viruses. If result is PRESUMPTIVE POSTIVE SARS-CoV-2 nucleic acids MAY BE PRESENT.   A presumptive positive result was obtained on the submitted specimen  and confirmed on repeat testing.  While 2019 novel coronavirus  (SARS-CoV-2) nucleic acids may be present in the submitted sample  additional confirmatory testing may be necessary for epidemiological  and / or clinical management purposes  to differentiate between  SARS-CoV-2 and other Sarbecovirus currently known to infect humans.  If clinically indicated additional testing with an alternate test  methodology [phone]) is advised. The SARS-CoV-2 RNA is generally  detectable in upper and lower respiratory sp ecimens during the acute  phase of infection. The expected result is Negative. Fact Sheet for Patients:  StrictlyIdeas.no Fact Sheet for Healthcare Providers: BankingDealers.co.za This test is not yet approved or cleared by the [place] FDA and has been authorized for detection and/or diagnosis of SARS-CoV-2 by FDA under an Emergency Use Authorization (EUA).  This EUA will remain in effect (meaning this test can be used) for the duration of the COVID-19 declaration under Section 564(b)(1) of the Act, 21 U.S.C. section 360bbb-3(b)(1), unless the authorization is terminated or revoked sooner. Performed at [facility], [address], [place], [place] [address]     Chemistries   Recent Labs  Lab 05/06/2019 1849  NA 141  K 3.4*  CL 108  CO2 20*  GLUCOSE 79  BUN 48*  CREATININE 2.93*  CALCIUM 7.9*  AST 150*  ALT 136*  ALKPHOS 151*  BILITOT 1.5*   ------------------------------------------------------------------------------------------------------------------  ------------------------------------------------------------------------------------------------------------------ GFR: Estimated Creatinine Clearance: 21.2 mL/min (A) (by C-G formula based on SCr of 2.93 mg/dL (H)). Liver Function Tests: Recent Labs  Lab 05/06/2019 1849  AST 150*  ALT 136*  ALKPHOS 151*  BILITOT 1.5*  PROT 6.8  ALBUMIN 2.5*   No results for input(s): LIPASE, AMYLASE in the last 168 hours. No results for input(s): AMMONIA in the last 168 hours. Coagulation Profile: No results for input(s): INR, PROTIME  in the last 168 hours. Cardiac Enzymes: No results for input(s): CKTOTAL, CKMB, CKMBINDEX, TROPONINI in the last 168 hours. BNP (last 3 results) No results for input(s): PROBNP in the last 8760 hours. HbA1C: No results for input(s): HGBA1C in the last 72 hours. CBG: No results for input(s): GLUCAP in the last 168 hours. Lipid Profile: No results for input(s): CHOL, HDL, LDLCALC, TRIG, CHOLHDL, LDLDIRECT in the last 72 hours. Thyroid Function Tests: No results for input(s): TSH, T4TOTAL, FREET4, T3FREE, THYROIDAB in the last 72 hours. Anemia Panel: No results for input(s): VITAMINB12, FOLATE, FERRITIN, TIBC, IRON, RETICCTPCT in the last 72 hours.  --------------------------------------------------------------------------------------------------------------- Urine analysis:    Component Value Date/Time   COLORURINE YELLOW 03/18/2019 1500   APPEARANCEUR CLOUDY (A) 03/18/2019 1500   LABSPEC 1.012 03/18/2019 1500   PHURINE 5.0 03/18/2019 1500   GLUCOSEU NEGATIVE 03/18/2019 1500   GLUCOSEU NEGATIVE 12/30/2017 1443   HGBUR LARGE (A) 03/18/2019 1500   BILIRUBINUR NEGATIVE  03/18/2019 1500   KETONESUR NEGATIVE 03/18/2019 1500   PROTEINUR NEGATIVE 03/18/2019 1500   UROBILINOGEN 0.2 12/30/2017 1443   NITRITE NEGATIVE 03/18/2019 1500   LEUKOCYTESUR LARGE (A) 03/18/2019 1500      Imaging Results:    Dg Chest Portable 1 View  Result Date: 05/12/2019 CLINICAL DATA:  Cough EXAM: PORTABLE CHEST 1 VIEW COMPARISON:  03/19/2019 FINDINGS: Consolidation in the left lower lobe compatible with pneumonia. Previously seen right lower lobe opacity has resolved. Heart is normal size. No visible effusions or acute bony abnormality. IMPRESSION: Worsening left lower lobe airspace opacity compatible with pneumonia. Resolved right lower lobe infiltrate. Electronically Signed   By: Rolm Baptise M.D.   On: 05/21/2019 19:24    My personal review of EKG: Rhythm NSR   Assessment & Plan:    Active Problems:   CAP (community acquired pneumonia)   1. Community-acquired pneumonia-patient presenting with left lower lobe infiltrate, which is a new finding as compared to previous chest x-ray in March when showed right lower lobe infiltrate which has in fact resolved.  Will start vancomycin and cefepime per pharmacy consultation.COVID-19 test is negative.  Follow blood culture results  2. Acute kidney injury on CKD stage IV-likely from poor p.o. intake and diarrhea.  Today creatinine is 2.93, last creatinine from March was 1.80.  Will start normal saline at 100 mL/h.  Follow BMP in a.m.  3. Transaminitis-seen on labs today, alk phos 151, total bili 1.5, AST 150, ALT 136.  Will obtain abdominal ultrasound in a.m.  4. Diarrhea-patient presenting with loose stools with poor p.o. intake.  Check GI pathogen panel, C. difficile PCR.  Follow the results.  Patient started on IV normal saline as above.  5. ?  Dysphagia-as per patient's daughter patient has been having coughing spells while drinking water thin liquids.  He does have new infiltrate in left lower lobe.  Will obtain swallow evaluation in  a.m.  Keep n.p.o.  6. Dementia-stable, no behavior disturbance.Continue Aricept, thiamine  7. History of CVA-stable, continue aspirin, Plavix.   8. History of hypertension-blood pressure is soft in the ED, hold amlodipine  9. Hypothyroidism-continue Synthroid    DVT Prophylaxis-   Lovenox   AM Labs Ordered, also please review Full Orders  Family Communication: Admission, patients condition and plan of care including tests being ordered have been discussed with the patients daughter on phone who indicate understanding and agree with the plan and Code Status.  Code Status: Full code  Admission status: Inpatient: Based on patients clinical presentation  and evaluation of above clinical data, I have made determination that patient meets Inpatient criteria at this time.  Time spent in minutes : 60 minutes   Oswald Hillock M.D on 05/24/2019 at 10:37 PM

## 2019-05-03 ENCOUNTER — Inpatient Hospital Stay (HOSPITAL_COMMUNITY): Payer: Medicare Other

## 2019-05-03 DIAGNOSIS — A0472 Enterocolitis due to Clostridium difficile, not specified as recurrent: Secondary | ICD-10-CM

## 2019-05-03 DIAGNOSIS — A419 Sepsis, unspecified organism: Principal | ICD-10-CM

## 2019-05-03 DIAGNOSIS — Z7189 Other specified counseling: Secondary | ICD-10-CM

## 2019-05-03 DIAGNOSIS — F0150 Vascular dementia without behavioral disturbance: Secondary | ICD-10-CM

## 2019-05-03 DIAGNOSIS — R748 Abnormal levels of other serum enzymes: Secondary | ICD-10-CM

## 2019-05-03 DIAGNOSIS — R6520 Severe sepsis without septic shock: Secondary | ICD-10-CM

## 2019-05-03 DIAGNOSIS — Z515 Encounter for palliative care: Secondary | ICD-10-CM

## 2019-05-03 DIAGNOSIS — J690 Pneumonitis due to inhalation of food and vomit: Secondary | ICD-10-CM

## 2019-05-03 LAB — COMPREHENSIVE METABOLIC PANEL
ALT: 108 U/L — ABNORMAL HIGH (ref 0–44)
AST: 125 U/L — ABNORMAL HIGH (ref 15–41)
Albumin: 1.9 g/dL — ABNORMAL LOW (ref 3.5–5.0)
Alkaline Phosphatase: 123 U/L (ref 38–126)
Anion gap: 12 (ref 5–15)
BUN: 46 mg/dL — ABNORMAL HIGH (ref 8–23)
CO2: 17 mmol/L — ABNORMAL LOW (ref 22–32)
Calcium: 7.4 mg/dL — ABNORMAL LOW (ref 8.9–10.3)
Chloride: 115 mmol/L — ABNORMAL HIGH (ref 98–111)
Creatinine, Ser: 2.71 mg/dL — ABNORMAL HIGH (ref 0.61–1.24)
GFR calc Af Amer: 24 mL/min — ABNORMAL LOW (ref >60)
GFR calc non Af Amer: 21 mL/min — ABNORMAL LOW (ref >60)
Glucose, Bld: 87 mg/dL (ref 70–99)
Potassium: 3.9 mmol/L (ref 3.5–5.1)
Sodium: 144 mmol/L (ref 135–145)
Total Bilirubin: 1.7 mg/dL — ABNORMAL HIGH (ref 0.3–1.2)
Total Protein: 5.4 g/dL — ABNORMAL LOW (ref 6.5–8.1)

## 2019-05-03 LAB — URINALYSIS, ROUTINE W REFLEX MICROSCOPIC
Bilirubin Urine: NEGATIVE
Glucose, UA: NEGATIVE mg/dL
Ketones, ur: NEGATIVE mg/dL
Nitrite: NEGATIVE
Protein, ur: 30 mg/dL — AB
Specific Gravity, Urine: 1.014 (ref 1.005–1.030)
pH: 5 (ref 5.0–8.0)

## 2019-05-03 LAB — CLOSTRIDIUM DIFFICILE BY PCR, REFLEXED: Toxigenic C. Difficile by PCR: NEGATIVE

## 2019-05-03 LAB — LACTIC ACID, PLASMA: Lactic Acid, Venous: 3.2 mmol/L (ref 0.5–1.9)

## 2019-05-03 LAB — MRSA PCR SCREENING: MRSA by PCR: NEGATIVE

## 2019-05-03 LAB — C DIFFICILE QUICK SCREEN W PCR REFLEX
C Diff antigen: NEGATIVE
C Diff toxin: POSITIVE — AB

## 2019-05-03 MED ORDER — ENOXAPARIN SODIUM 30 MG/0.3ML ~~LOC~~ SOLN: 30.0000 mg | SUBCUTANEOUS | Status: DC

## 2019-05-03 MED ORDER — SODIUM CHLORIDE 0.9 % IV BOLUS
1000.0000 mL | Freq: Once | INTRAVENOUS | Status: AC
  Administered 2019-05-03: 1000 mL via INTRAVENOUS

## 2019-05-03 MED ORDER — VANCOMYCIN HCL 500 MG IV SOLR
500.0000 mg | Freq: Four times a day (QID) | Status: DC
  Administered 2019-05-03: 500 mg via RECTAL
  Filled 2019-05-03 (×6): qty 500

## 2019-05-03 MED ORDER — ENOXAPARIN SODIUM 40 MG/0.4ML ~~LOC~~ SOLN
40.0000 mg | SUBCUTANEOUS | Status: DC
  Administered 2019-05-03: 10:00:00 40 mg via SUBCUTANEOUS
  Filled 2019-05-03: qty 0.4

## 2019-05-03 MED ORDER — ACETAMINOPHEN 325 MG PO TABS: 650.0000 mg | ORAL_TABLET | Freq: Four times a day (QID) | ORAL | Status: DC | PRN

## 2019-05-03 MED ORDER — ONDANSETRON HCL 4 MG PO TABS: 4.0000 mg | ORAL_TABLET | Freq: Four times a day (QID) | ORAL | Status: DC | PRN

## 2019-05-03 MED ORDER — DONEPEZIL HCL 10 MG PO TABS: 10.0000 mg | ORAL_TABLET | Freq: Every day | ORAL | Status: DC

## 2019-05-03 MED ORDER — SODIUM CHLORIDE 0.9 % IV SOLN
INTRAVENOUS | Status: DC
  Administered 2019-05-03 (×2): via INTRAVENOUS

## 2019-05-03 MED ORDER — ASPIRIN 81 MG PO CHEW
81.0000 mg | CHEWABLE_TABLET | Freq: Every day | ORAL | Status: DC
  Administered 2019-05-03: 10:00:00 81 mg via ORAL
  Filled 2019-05-03: qty 1

## 2019-05-03 MED ORDER — ACETAMINOPHEN 650 MG RE SUPP: 650.0000 mg | Freq: Four times a day (QID) | RECTAL | Status: DC | PRN

## 2019-05-03 MED ORDER — VANCOMYCIN 50 MG/ML ORAL SOLUTION
125.0000 mg | Freq: Four times a day (QID) | ORAL | Status: DC
  Filled 2019-05-03 (×3): qty 2.5

## 2019-05-03 MED ORDER — VITAMIN B-1 100 MG PO TABS
100.0000 mg | ORAL_TABLET | Freq: Every day | ORAL | Status: DC
  Administered 2019-05-03: 100 mg via ORAL
  Filled 2019-05-03: qty 1

## 2019-05-03 MED ORDER — LEVOTHYROXINE SODIUM 50 MCG PO TABS
50.0000 ug | ORAL_TABLET | Freq: Every day | ORAL | Status: DC
  Administered 2019-05-03: 10:00:00 50 ug via ORAL
  Filled 2019-05-03 (×2): qty 1

## 2019-05-03 MED ORDER — ONDANSETRON HCL 4 MG/2ML IJ SOLN: 4.0000 mg | Freq: Four times a day (QID) | INTRAMUSCULAR | Status: DC | PRN

## 2019-05-03 MED ORDER — POTASSIUM CHLORIDE 10 MEQ/100ML IV SOLN
10.0000 meq | Freq: Once | INTRAVENOUS | Status: AC
  Administered 2019-05-03: 10 meq via INTRAVENOUS
  Filled 2019-05-03: qty 100

## 2019-05-03 MED ORDER — VANCOMYCIN HCL 500 MG IV SOLR: 500.0000 mg | Freq: Four times a day (QID) | Status: DC

## 2019-05-03 MED ORDER — CLOPIDOGREL BISULFATE 75 MG PO TABS
75.0000 mg | ORAL_TABLET | Freq: Every day | ORAL | Status: DC
  Administered 2019-05-03: 10:00:00 75 mg via ORAL
  Filled 2019-05-03: qty 1

## 2019-05-03 MED ORDER — SODIUM CHLORIDE 0.9 % IV SOLN
INTRAVENOUS | Status: DC | PRN
  Administered 2019-05-03: 250 mL via INTRAVENOUS

## 2019-05-03 NOTE — ED Notes (Signed)
Pt O2 sats decreased to 71% on 2 L. Pt is mouth breather. Dr Darrick Meigs notified. New orders given for 100% NRB. Will continue to monitor.

## 2019-05-03 NOTE — Progress Notes (Signed)
PROGRESS NOTE  Author Hatlestad Toppin EZM:[id number] DOB: 1932/10/22 DOA: 05/21/2019 PCP: Janith Lima, MD  HPI/Recap of past 24 hours:  Frail Demented elderly , NAD Has congested cough, on 5liter HFNC, does not appear in respiratory distress, no edema, appear dry Stool has a strong odor  Assessment/Plan: Active Problems:   CAP (community acquired pneumonia)  Recurrent aspiration pneumonia/sepsis on presentation -with fever 100.7, tachycardia 102, bp 85/59 , lactic acidosis on presentation  -blood culture no growth, mrsa screening negative, SARS Cov2 negative -he received vanc/cefepime on admission, will d/c vanc, continue cefepime for now, continue hydration -he did not pass swallow eval, npo per speech recommendation -I have discussed with daughter over the phone, daughter declined ng placement, she states " I am pretty sure he will pull it out", daughter want to change all meds to iv if possible  Diarrhea With a strong odor c diff toxin +, pcr negative, he received one dose of vanc enema, this is discontinued after c diff test finalized. gi pcr panel pending Will start vanc enema, d/c vanc enema if c diff pcr is negative  History of hypertension-blood pressure is soft , hold amlodipine, on hydration  AKI on CKDIII -likely prerenal -continue hydration, treat sepsis -repeat lab in am -renal dosing meds  Elevated LFT -from sepsis? -ab [place] "1. Cholelithiasis and sludge with nonspecific borderline gallbladder wall thickening. If there is any clinical concern for acute cholecystitis, consider further evaluation with HIDA scan. 2. Stable bilateral proximal common iliac artery aneurysms"  H/o carotid occlusion, CVA, Dementia- currently no behavior disturbance.Continue Aricept, thiamine when able to take oral meds aspirin, Plavix. when able to take oral meds H/o temporary PEG tube placement in 2008 after CVA  Hypothyroidism-continue Synthroid  Chronic colonocutaneous  fistula drain    FTT: with dementia, recurrent aspiration pneumonia and malnutrition, poor prognosis, palliative care consulted  Code Status: Full  Family Communication: patient , daughter over the phone  Disposition Plan: not ready to discharge   Consultants:  Palliative care  Procedures:  vanc enema  Antibiotics:  Iv vanc x1  Iv cefepime from admission   vanc enema   Objective: BP (!) 98/52 (BP Location: Right Arm)   Pulse 86   Temp 97.6 F (36.4 C) (Axillary)   Resp (!) 24   Ht 6\' 1"  (1.854 m)   Wt 86.2 kg   SpO2 90%   BMI 25.07 kg/m   Intake/Output Summary (Last 24 hours) at 05/03/2019 1112 Last data filed at 05/03/2019 2329 Gross per 24 hour  Intake 4198.78 ml  Output -  Net 4198.78 ml   Filed Weights   05/16/2019 1819  Weight: 86.2 kg    Exam: Patient is examined daily including today on 05/03/2019, exams remain the same as of yesterday except that has changed    General:  Frail, demented but NAD  Cardiovascular: RRR  Respiratory: diminished, intermittent rhonchi  Abdomen: Soft,  positive BS, + colonocutanous fistula drain  Musculoskeletal: No Edema  Neuro: alert, respond to questions intermittently   Data Reviewed: Basic Metabolic Panel: Recent Labs  Lab 05/20/2019 1849 05/03/19 0536  NA 141 144  K 3.4* 3.9  CL 108 115*  CO2 20* 17*  GLUCOSE 79 87  BUN 48* 46*  CREATININE 2.93* 2.71*  CALCIUM 7.9* 7.4*   Liver Function Tests: Recent Labs  Lab 05/09/2019 1849 05/03/19 0536  AST 150* 125*  ALT 136* 108*  ALKPHOS 151* 123  BILITOT 1.5* 1.7*  PROT 6.8 5.4*  ALBUMIN  2.5* 1.9*   No results for input(s): LIPASE, AMYLASE in the last 168 hours. No results for input(s): AMMONIA in the last 168 hours. CBC: Recent Labs  Lab 05/05/2019 1849 05/03/19 0536  WBC 2.7* 2.6*  NEUTROABS 1.3*  --   HGB 11.5* 10.5*  HCT 34.3* 32.3*  MCV 104.3* 108.8*  PLT 135* 116*   Cardiac Enzymes:   No results for input(s): CKTOTAL, CKMB, CKMBINDEX,  TROPONINI in the last 168 hours. BNP (last 3 results) No results for input(s): BNP in the last 8760 hours.  ProBNP (last 3 results) No results for input(s): PROBNP in the last 8760 hours.  CBG: No results for input(s): GLUCAP in the last 168 hours.  Recent Results (from the past 240 hour(s))  Blood culture (routine x 2)     Status: None (Preliminary result)   Collection Time: 05/07/2019  7:11 PM  Result Value Ref Range Status   Specimen Description BLOOD RIGHT HAND  Final   Special Requests   Final    BOTTLES DRAWN AEROBIC AND ANAEROBIC Blood Culture adequate volume   Culture   Final    NO GROWTH < 12 HOURS Performed at [facility], [address] [place], [place] [address]    Report Status PENDING  Incomplete  Blood culture (routine x 2)     Status: None (Preliminary result)   Collection Time: 05/17/2019  7:12 PM  Result Value Ref Range Status   Specimen Description BLOOD LEFT HAND  Final   Special Requests   Final    BOTTLES DRAWN AEROBIC ONLY Blood Culture adequate volume   Culture   Final    NO GROWTH < 12 HOURS Performed at [facility], [address], [place], [place] [address]    Report Status PENDING  Incomplete  SARS Coronavirus 2 (CEPHEID - Performed in [place] hospital lab), Hosp Order     Status: None   Collection Time: 05/28/2019  8:26 PM  Result Value Ref Range Status   SARS Coronavirus 2 NEGATIVE NEGATIVE Final    Comment: (NOTE) If result is NEGATIVE SARS-CoV-2 target nucleic acids are NOT DETECTED. The SARS-CoV-2 RNA is generally detectable in upper and lower  respiratory specimens during the acute phase of infection. The lowest  concentration of SARS-CoV-2 viral copies this assay can detect is 250  copies / mL. A negative result does not preclude SARS-CoV-2 infection  and should not be used as the sole basis for treatment or other  patient management decisions.  A negative result may occur with  improper specimen collection / handling, submission of  specimen other  than nasopharyngeal swab, presence of viral mutation(s) within the  areas targeted by this assay, and inadequate number of viral copies  (<250 copies / mL). A negative result must be combined with clinical  observations, patient history, and epidemiological information. If result is POSITIVE SARS-CoV-2 target nucleic acids are DETECTED. The SARS-CoV-2 RNA is generally detectable in upper and lower  respiratory specimens dur ing the acute phase of infection.  Positive  results are indicative of active infection with SARS-CoV-2.  Clinical  correlation with patient history and other diagnostic information is  necessary to determine patient infection status.  Positive results do  not rule out bacterial infection or co-infection with other viruses. If result is PRESUMPTIVE POSTIVE SARS-CoV-2 nucleic acids MAY BE PRESENT.   A presumptive positive result was obtained on the submitted specimen  and confirmed on repeat testing.  While 2019 novel coronavirus  (SARS-CoV-2) nucleic acids  may be present in the submitted sample  additional confirmatory testing may be necessary for epidemiological  and / or clinical management purposes  to differentiate between  SARS-CoV-2 and other Sarbecovirus currently known to infect humans.  If clinically indicated additional testing with an alternate test  methodology [phone]) is advised. The SARS-CoV-2 RNA is generally  detectable in upper and lower respiratory sp ecimens during the acute  phase of infection. The expected result is Negative. Fact Sheet for Patients:  StrictlyIdeas.no Fact Sheet for Healthcare Providers: BankingDealers.co.za This test is not yet approved or cleared by the [place] FDA and has been authorized for detection and/or diagnosis of SARS-CoV-2 by FDA under an Emergency Use Authorization (EUA).  This EUA will remain in effect (meaning this test can be used) for the  duration of the COVID-19 declaration under Section 564(b)(1) of the Act, 21 U.S.C. section 360bbb-3(b)(1), unless the authorization is terminated or revoked sooner. Performed at [facility], [address], [place], [place] [address]   C difficile quick scan w PCR reflex     Status: Abnormal   Collection Time: 05/03/19  2:20 AM  Result Value Ref Range Status   C Diff antigen NEGATIVE NEGATIVE Final   C Diff toxin POSITIVE (A) NEGATIVE Final   C Diff interpretation Results are indeterminate. See PCR results.  Final    Comment: Performed at [facility], [address], [place], [place] [address]  MRSA PCR Screening     Status: None   Collection Time: 05/03/19  5:43 AM  Result Value Ref Range Status   MRSA by PCR NEGATIVE NEGATIVE Final    Comment:        The GeneXpert MRSA Assay (FDA approved for NASAL specimens only), is one component of a comprehensive MRSA colonization surveillance program. It is not intended to diagnose MRSA infection nor to guide or monitor treatment for MRSA infections. Performed at [facility], [place] [address], [place], [place] [address]      Studies: US Abdomen Complete  Result Date: 05/03/2019 CLINICAL DATA:  Transaminitis. EXAM: ABDOMEN ULTRASOUND COMPLETE COMPARISON:  CT abdomen pelvis dated March 18, 2019. FINDINGS: Gallbladder: Multiple gallstones and sludge are present. Borderline wall thickening visualized. No sonographic Murphy sign noted by sonographer. Common bile duct: Diameter: 6 mm, normal. Liver: No focal lesion identified. Within normal limits in parenchymal echogenicity. Portal vein is patent on color Doppler imaging with normal direction of blood flow towards the liver. IVC: No abnormality visualized. Pancreas: Not well visualized due to overlying bowel gas. Spleen: Size and appearance within normal limits. Right Kidney: Length: 9.7 cm. Echogenicity within normal limits. No mass or hydronephrosis visualized. Unchanged 4.0 cm simple cyst.  Left Kidney: Length: 10.8 cm. Echogenicity within normal limits. No mass or hydronephrosis visualized. Unchanged simple cysts measuring up to 2.7 cm. Abdominal aorta: No aneurysm visualized. Bilateral proximal common iliac artery aneurysms again noted, measuring 3.0 cm on the right. Other findings: None. IMPRESSION: 1. Cholelithiasis and sludge with nonspecific borderline gallbladder wall thickening. If there is any clinical concern for acute cholecystitis, consider further evaluation with HIDA scan. 2. Stable bilateral proximal common iliac artery aneurysms. Electronically Signed   By: Titus Dubin M.D.   On: 05/03/2019 08:48   Dg Chest Portable 1 View  Result Date: 05/01/2019 CLINICAL DATA:  Cough EXAM: PORTABLE CHEST 1 VIEW COMPARISON:  03/19/2019 FINDINGS: Consolidation in the left lower lobe compatible with pneumonia. Previously seen right lower lobe opacity has resolved. Heart is normal size. No  visible effusions or acute bony abnormality. IMPRESSION: Worsening left lower lobe airspace opacity compatible with pneumonia. Resolved right lower lobe infiltrate. Electronically Signed   By: Rolm Baptise M.D.   On: 05/17/2019 19:24    Scheduled Meds: . aspirin  81 mg Oral Daily  . clopidogrel  75 mg Oral Daily  . donepezil  10 mg Oral QHS  . enoxaparin (LOVENOX) injection  40 mg Subcutaneous Q24H  . levothyroxine  50 mcg Oral Q0600  . thiamine  100 mg Oral Daily    Continuous Infusions: . sodium chloride 100 mL/hr at 05/03/19 0437  . ceFEPime (MAXIPIME) IV Stopped (05/14/2019 2324)  . vancomycin       Time spent: 75mins I have personally reviewed and interpreted on  05/03/2019 daily labs, tele strips, imagings as discussed above under date review session and assessment and plans.  I reviewed all nursing notes, pharmacy notes, consultant notes,  vitals, pertinent old records  I have discussed plan of care as described above with RN , patient and family on 05/03/2019   Florencia Reasons MD, PhD   [facility] Pager [phone]. If 7PM-7AM, please contact night-coverage at [email or web address], password [facility] 05/03/2019, 11:12 AM  LOS: 1 day

## 2019-05-03 NOTE — Evaluation (Signed)
Clinical/Bedside Swallow Evaluation Patient Details  Name: Robert Barnett MRN: [id number] Date of Birth: 08/21/1932  Today's Date: 05/03/2019 Time: SLP Start Time (ACUTE ONLY): 19 SLP Stop Time (ACUTE ONLY): 1119 SLP Time Calculation (min) (ACUTE ONLY): 17 min  Past Medical History:  Past Medical History:  Diagnosis Date  . Arthritis   . BPH (benign prostatic hyperplasia)   . Carotid artery occlusion   . Dementia ([place])   . Dysphagia   . Hyperlipidemia   . Hypertension   . Peripheral vascular disease ([place])   . Stroke [facility])    Right hemispheric CVA  . Umbilical hernia    Past Surgical History:  Past Surgical History:  Procedure Laterality Date  . CAROTID ENDARTERECTOMY  10/06/2007   Right CEA by Dr. Amedeo Plenty  . IR CATHETER TUBE CHANGE  09/15/2017  . IR CATHETER TUBE CHANGE  12/03/2017  . IR CATHETER TUBE CHANGE  12/23/2017  . IR CATHETER TUBE CHANGE  05/07/2018  . IR CATHETER TUBE CHANGE  06/29/2018  . IR CATHETER TUBE CHANGE  08/18/2018  . IR CATHETER TUBE CHANGE  09/08/2018  . IR CATHETER TUBE CHANGE  10/15/2018  . IR CATHETER TUBE CHANGE  01/26/2019  . IR GENERIC HISTORICAL  07/09/2016   IR RADIOLOGIST EVAL & MGMT 07/09/2016 Arne Cleveland, MD [facility] INTERV RAD  . IR RADIOLOGIST EVAL & MGMT  09/15/2017  . IR RADIOLOGIST EVAL & MGMT  05/06/2018  . PEG TUBE PLACEMENT  2008   after CVA   HPI:  Pt is an 85 yo male admitted with several days of diarrhea and CXR showing LLL PNA. Per chart review his daughter has been noticing increased coughing and wetness with drinking water. Pt was seen during recent admission with MBS 03/22/19 recommending Dys 1 diet and thin liquids due to cognitively-based oral dysphagia. He had coughing and throat clearing throughout the study that was not related to aspiration. PMH also includes: CVA, HTN, HLD, dysphagia, dementia, CKD   Assessment / Plan / Recommendation Clinical Impression  Pt's swallow function appears to have declined since recent  admission, with similar cognitively-based oral deficits but now not initiating a pharyngeal swallow at all with pureed boluses. SLP orally suctioned applesauce from his mouth. Pt's voice is mildly wet at baseline, concerning for reduced secretion management, but becomes significantly wet after each sip of water, followed by weak, wet coughing. SLP provided Mod cues for additional, volitional coughs and utilized yankauer to remove moderate amounts of what appeared to be thin liquids mixed with secretions. Pt is not safe at this time for a PO diet. It is possible that with additional time to overcome acute deconditioning that he may be able to resume a modified PO diet such as the one he was on before, although it is likely that risk for aspiration remains given cognitive impairment. Will continue to follow to determine readiness. SLP Visit Diagnosis: Dysphagia, oropharyngeal phase (R13.12)    Aspiration Risk  Moderate aspiration risk    Diet Recommendation NPO   Medication Administration: Via alternative means    Other  Recommendations Oral Care Recommendations: Oral care QID Other Recommendations: Have oral suction available;Remove water pitcher   Follow up Recommendations (tba)      Frequency and Duration min 2x/week  2 weeks       Prognosis Prognosis for Safe Diet Advancement: Fair Barriers to Reach Goals: Cognitive deficits;Other (Comment)(PLOF)      Swallow Study   General HPI: Pt is an 85 yo male admitted with  several days of diarrhea and CXR showing LLL PNA. Per chart review his daughter has been noticing increased coughing and wetness with drinking water. Pt was seen during recent admission with MBS 03/22/19 recommending Dys 1 diet and thin liquids due to cognitively-based oral dysphagia. He had coughing and throat clearing throughout the study that was not related to aspiration. PMH also includes: CVA, HTN, HLD, dysphagia, dementia, CKD Type of Study: Bedside Swallow  Evaluation Previous Swallow Assessment: see HPI Diet Prior to this Study: NPO Temperature Spikes Noted: Yes(100.7) Respiratory Status: Nasal cannula History of Recent Intubation: No Behavior/Cognition: Alert;Cooperative;Confused;Distractible;Requires cueing Oral Cavity Assessment: Dry Oral Care Completed by SLP: Yes Oral Cavity - Dentition: Edentulous Self-Feeding Abilities: Needs assist Patient Positioning: Upright in bed Baseline Vocal Quality: Normal Volitional Cough: Weak Volitional Swallow: Unable to elicit    Oral/Motor/Sensory Function Overall Oral Motor/Sensory Function: (not consistently following commands to assess)   Ice Chips Ice chips: Impaired Presentation: Spoon Oral Phase Functional Implications: Prolonged oral transit   Thin Liquid Thin Liquid: Impaired Presentation: Spoon;Cup;Self Fed Oral Phase Functional Implications: Oral holding Pharyngeal  Phase Impairments: Wet Vocal Quality;Cough - Immediate    Nectar Thick Nectar Thick Liquid: Not tested   Honey Thick Honey Thick Liquid: Not tested   Puree Puree: Impaired Presentation: Spoon Oral Phase Impairments: Poor awareness of bolus Oral Phase Functional Implications: Oral holding Pharyngeal Phase Impairments: Other (comments)(swallow not initiated)   Solid     Solid: Not tested      Robert Barnett Robert Barnett 05/03/2019,12:16 PM  Pollyann Glen, M.A. [place] Acute Environmental education officer [phone] Office [phone]

## 2019-05-03 NOTE — Consult Note (Addendum)
Consultation Note Date: 05/03/2019   Patient Name: Robert Barnett  DOB: 1932-11-30  MRN: [id number]  Age / Sex: 85 y.o., male  PCP: Janith Lima, MD Referring Physician: Florencia Reasons, MD  Reason for Consultation: Establishing goals of care  HPI/Patient Profile: 85 y.o. male  with past medical history of dementia, h/o CVA, PVD, carotid artery occlusion, HTN, HLD, recent admission with aspiration pneumonia and diarrhea (enteropathogenic E. coli)  in March 2020 admitted on 05/09/2019 from home with family with worsening diarrhea and weakness. Found to have HCAP with concern for recurrent aspiration and + C. diff.   Clinical Assessment and Goals of Care: I met today at Robert Barnett's bedside. He arouses easily and greets me appropriately but believes that he is in his home. He appears pleasantly confused but is "picking" with his mitts.   I called and discussed Robert Barnett with his daughter, Robert Barnett. Robert Barnett tells me that he has a total of 6 children that make decisions for him but she is his primary caregiver. She tells me that he has had ongoing diarrhea since last admission (started back a few days after discharge) and that he has continued with very poor intake but she has been able to get him to accept liquids and ~3 Ensure daily even though he will not eat solid foods. She says he did well with PT and was ambulating with assistance at home. He significantly declined over the past week PTA and was unable to walk and with worsening diarrhea.   I explained to Organ her father's current diagnosis of HCAP and C. diff. I did note that his C. diff was negative when tested last admission. We discussed these diagnoses in the context of progressing dementia. Robert Barnett tells me that she can clearly see her father's decline. We discussed GOC and decisions that come along with worsening health. Robert Barnett indicates desire for DNR but needs  to check with her siblings before making a final decision - I encouraged her to speak further with them. She also does not want him to have a Cortrak (she believes he will pull it out) and leaning away from artificial feeding but is unsure and again would have to verify with her siblings as well. Main goal is that he is to return home with Robert Barnett who is retired and cares for him. They do not want him to go to a facility.   All questions/concerns addressed. Emotional support provided. I will follow up with Robert Barnett tomorrow.   Primary Decision Maker NEXT OF KIN 6 adult children    SUMMARY OF RECOMMENDATIONS   - Children need to further discuss their [place] and decisions. Will follow up tomorrow.   Code Status/Advance Care Planning:  Full code - family considering DNR   Symptom Management:   Per primary.   Recommend continued SLP follow up for dysphagia and aspiration risk - I have discussed with family that he will be at continued risk for aspiration.   Palliative Prophylaxis:   Aspiration, Bowel Regimen, Delirium Protocol, Frequent Pain  Assessment and Oral Care  Psycho-social/Spiritual:   Desire for further Chaplaincy support:no  Additional Recommendations: Caregiving  Support/Resources, Education on Hospice and Grief/Bereavement Support  Prognosis:   Dependent on progress of swallow eval and family desire for aggressive interventions such as artificial feeding. Best case scenario he is eligible for hospice at home.   Discharge Planning: Return home with home health and palliative vs. Hospice.       Primary Diagnoses: Present on Admission: . CAP (community acquired pneumonia)   I have reviewed the medical record, interviewed the patient and family, and examined the patient. The following aspects are pertinent.  Past Medical History:  Diagnosis Date  . Arthritis   . BPH (benign prostatic hyperplasia)   . Carotid artery occlusion   . Dementia ([place])   . Dysphagia   .  Hyperlipidemia   . Hypertension   . Peripheral vascular disease ([place])   . Stroke [facility])    Right hemispheric CVA  . Umbilical hernia    Social History   Socioeconomic History  . Marital status: Widowed    Spouse name: Not on file  . Number of children: Not on file  . Years of education: Not on file  . Highest education level: Not on file  Occupational History  . Occupation: Retired  Scientific laboratory technician  . Financial resource strain: Not on file  . Food insecurity:    Worry: Not on file    Inability: Not on file  . Transportation needs:    Medical: Not on file    Non-medical: Not on file  Tobacco Use  . Smoking status: Former Research scientist (life sciences)  . Smokeless tobacco: Never Used  . Tobacco comment: remote h/o  Substance and Sexual Activity  . Alcohol use: No  . Drug use: No  . Sexual activity: Not on file  Lifestyle  . Physical activity:    Days per week: Not on file    Minutes per session: Not on file  . Stress: Not on file  Relationships  . Social connections:    Talks on phone: Not on file    Gets together: Not on file    Attends religious service: Not on file    Active member of club or organization: Not on file    Attends meetings of clubs or organizations: Not on file    Relationship status: Not on file  Other Topics Concern  . Not on file  Social History Narrative  . Not on file   Family History  Problem Relation Age of Onset  . Obesity Daughter    Scheduled Meds: . aspirin  81 mg Oral Daily  . clopidogrel  75 mg Oral Daily  . donepezil  10 mg Oral QHS  . [START ON May 18, 2019] enoxaparin (LOVENOX) injection  30 mg Subcutaneous Q24H  . levothyroxine  50 mcg Oral Q0600  . thiamine  100 mg Oral Daily  . vancomycin (VANCOCIN) rectal ENEMA  500 mg Rectal Q6H   Continuous Infusions: . sodium chloride 100 mL/hr at 05/03/19 0437  . ceFEPime (MAXIPIME) IV Stopped (05/13/2019 2324)   PRN Meds:.acetaminophen **OR** acetaminophen, ondansetron **OR** ondansetron (ZOFRAN) IV No  Known Allergies Review of Systems  Unable to perform ROS: Dementia    Physical Exam Vitals signs and nursing note reviewed.  Constitutional:      Appearance: He is ill-appearing.     Comments: Frail   Cardiovascular:     Rate and Rhythm: Normal rate.  Pulmonary:     Effort: Pulmonary effort is  normal. No tachypnea, accessory muscle usage or respiratory distress.  Abdominal:     General: Abdomen is flat.     Comments: Malodorous stool  Neurological:     Mental Status: He is alert. He is disoriented and confused.     Vital Signs: BP (!) 81/54 (BP Location: Right Arm)   Pulse 86   Temp 99.3 F (37.4 C) (Axillary)   Resp (!) 22   Ht _0  (1.854 m)   Wt 86.2 kg   SpO2 93%   BMI 25.07 kg/m  Pain Scale: 0-10   Pain Score: 0-No pain   SpO2: SpO2: 93 % O2 Device:SpO2: 93 % O2 Flow Rate: .O2 Flow Rate (L/min): 3 L/min  IO: Intake/output summary:   Intake/Output Summary (Last 24 hours) at 05/03/2019 1600 Last data filed at 05/01/2019 2329 Gross per 24 hour  Intake 4198.78 ml  Output -  Net 4198.78 ml    LBM: Last BM Date: 05/03/19 Baseline Weight: Weight: 86.2 kg Most recent weight: Weight: 86.2 kg     Palliative Assessment/Data: 30%     Time In: 1530 Time Out: 1640 Time Total: 70 min Greater than 50%  of this time was spent counseling and coordinating care related to the above assessment and plan.  Signed by: Vinie Sill, NP Palliative Medicine Team Pager # [phone] (M-F 8a-5p) Team Phone # [phone] (Nights/Weekends)

## 2019-05-03 NOTE — Progress Notes (Addendum)
Shift event: NP went to see pt after 2 boluses had been given for hypotension and SBP still 72.  S: pt can not participate in a complete ROS due to dementia. He does say that he is a "little" short of breath. No other c/o. O: chronically ill, but fairly well appearing male in NAD. Non toxic appearing. Alert. BP 72/49. Getting a manual one. HR 70s. Sao2 in mid 90s. Afebrile.  Card: RRR. Lungs: rhonchi throughout. Extremities: no edema. Mucosa: dry.  A/P: 1. Hypotension-likely due to hypovolemia. Will r/o sepsis. R/p CBC, LA, CMP. Give 3rd bolus and check a manual BP-82/48. 100cc UO this shift.  2. PNA-aspiration hx. Recheck CXR.  KJKG, NP [facility] Update: LA elevated at 4.3. Has already had 3L boluses plus 100cc/hr maintenance fluids. Will r/p at 5am. Aline placed for accurate BPs and BP 119. CXR with ? Edema vs infection. Given LA, will add vancomycin IV for HCAP. Mg 1.7. Creatinine slightly up. CBC with low plts, so d/c Lovenox and add SCDs for VTE ppx.  KJKG, NP [facility]

## 2019-05-04 DIAGNOSIS — A0472 Enterocolitis due to Clostridium difficile, not specified as recurrent: Secondary | ICD-10-CM

## 2019-05-04 DIAGNOSIS — T17908A Unspecified foreign body in respiratory tract, part unspecified causing other injury, initial encounter: Secondary | ICD-10-CM

## 2019-05-04 DIAGNOSIS — R740 Nonspecific elevation of levels of transaminase and lactic acid dehydrogenase [LDH]: Secondary | ICD-10-CM

## 2019-05-04 DIAGNOSIS — I469 Cardiac arrest, cause unspecified: Secondary | ICD-10-CM

## 2019-05-04 LAB — URINE CULTURE: Culture: NO GROWTH

## 2019-05-04 LAB — CBC WITH DIFFERENTIAL/PLATELET
Abs Immature Granulocytes: 0.07 10*3/uL (ref 0.00–0.07)
Basophils Absolute: 0 10*3/uL (ref 0.0–0.1)
Basophils Relative: 0 %
Eosinophils Absolute: 0 10*3/uL (ref 0.0–0.5)
Eosinophils Relative: 0 %
HCT: 51.5 % (ref 39.0–52.0)
Hemoglobin: 16.2 g/dL (ref 13.0–17.0)
Immature Granulocytes: 3 %
Lymphocytes Relative: 10 %
Lymphs Abs: 0.2 10*3/uL — ABNORMAL LOW (ref 0.7–4.0)
MCH: 35.6 pg — ABNORMAL HIGH (ref 26.0–34.0)
MCHC: 31.5 g/dL (ref 30.0–36.0)
MCV: 113.2 fL — ABNORMAL HIGH (ref 80.0–100.0)
Monocytes Absolute: 0.5 10*3/uL (ref 0.1–1.0)
Monocytes Relative: 23 %
Neutro Abs: 1.5 10*3/uL — ABNORMAL LOW (ref 1.7–7.7)
Neutrophils Relative %: 64 %
Platelets: 58 10*3/uL — ABNORMAL LOW (ref 150–400)
RBC: 4.55 MIL/uL (ref 4.22–5.81)
RDW: 23.2 % — ABNORMAL HIGH (ref 11.5–15.5)
WBC: 2.4 10*3/uL — ABNORMAL LOW (ref 4.0–10.5)
nRBC: 3.8 % — ABNORMAL HIGH (ref 0.0–0.2)

## 2019-05-04 LAB — COMPREHENSIVE METABOLIC PANEL
ALT: 91 U/L — ABNORMAL HIGH (ref 0–44)
AST: 109 U/L — ABNORMAL HIGH (ref 15–41)
Albumin: 1.6 g/dL — ABNORMAL LOW (ref 3.5–5.0)
Alkaline Phosphatase: 106 U/L (ref 38–126)
Anion gap: 15 (ref 5–15)
BUN: 49 mg/dL — ABNORMAL HIGH (ref 8–23)
CO2: 11 mmol/L — ABNORMAL LOW (ref 22–32)
Calcium: 7.1 mg/dL — ABNORMAL LOW (ref 8.9–10.3)
Chloride: 121 mmol/L — ABNORMAL HIGH (ref 98–111)
Creatinine, Ser: 2.77 mg/dL — ABNORMAL HIGH (ref 0.61–1.24)
GFR calc Af Amer: 23 mL/min — ABNORMAL LOW (ref >60)
GFR calc non Af Amer: 20 mL/min — ABNORMAL LOW (ref >60)
Glucose, Bld: 43 mg/dL — CL (ref 70–99)
Potassium: 4.9 mmol/L (ref 3.5–5.1)
Sodium: 147 mmol/L — ABNORMAL HIGH (ref 135–145)
Total Bilirubin: 1.7 mg/dL — ABNORMAL HIGH (ref 0.3–1.2)
Total Protein: 5.1 g/dL — ABNORMAL LOW (ref 6.5–8.1)

## 2019-05-04 LAB — BASIC METABOLIC PANEL
Anion gap: 11 (ref 5–15)
BUN: 50 mg/dL — ABNORMAL HIGH (ref 8–23)
CO2: 14 mmol/L — ABNORMAL LOW (ref 22–32)
Calcium: 7.2 mg/dL — ABNORMAL LOW (ref 8.9–10.3)
Chloride: 121 mmol/L — ABNORMAL HIGH (ref 98–111)
Creatinine, Ser: 2.9 mg/dL — ABNORMAL HIGH (ref 0.61–1.24)
GFR calc Af Amer: 22 mL/min — ABNORMAL LOW (ref >60)
GFR calc non Af Amer: 19 mL/min — ABNORMAL LOW (ref >60)
Glucose, Bld: 87 mg/dL (ref 70–99)
Potassium: 4.1 mmol/L (ref 3.5–5.1)
Sodium: 146 mmol/L — ABNORMAL HIGH (ref 135–145)

## 2019-05-04 LAB — GASTROINTESTINAL PANEL BY PCR, STOOL (REPLACES STOOL CULTURE)

## 2019-05-04 LAB — GLUCOSE, CAPILLARY
Glucose-Capillary: 114 mg/dL — ABNORMAL HIGH (ref 70–99)
Glucose-Capillary: 42 mg/dL — CL (ref 70–99)
Glucose-Capillary: 80 mg/dL (ref 70–99)
Glucose-Capillary: 89 mg/dL (ref 70–99)

## 2019-05-04 LAB — MAGNESIUM: Magnesium: 1.7 mg/dL (ref 1.7–2.4)

## 2019-05-04 LAB — LACTIC ACID, PLASMA
Lactic Acid, Venous: 4.3 mmol/L (ref 0.5–1.9)
Lactic Acid, Venous: 2.6 mmol/L (ref 0.5–1.9)
Lactic Acid, Venous: 2.4 mmol/L (ref 0.5–1.9)

## 2019-05-04 MED ORDER — DEXTROSE 5 % IV SOLN
INTRAVENOUS | Status: DC
  Administered 2019-05-04: 05:00:00 via INTRAVENOUS

## 2019-05-04 MED ORDER — FENTANYL CITRATE (PF) 100 MCG/2ML IJ SOLN
INTRAMUSCULAR | Status: AC
  Filled 2019-05-04: qty 2

## 2019-05-04 MED ORDER — SODIUM CHLORIDE 0.9 % IV SOLN: INTRAVENOUS | Status: DC | PRN

## 2019-05-04 MED ORDER — VANCOMYCIN VARIABLE DOSE PER UNSTABLE RENAL FUNCTION (PHARMACIST DOSING): Status: DC

## 2019-05-04 MED ORDER — FENTANYL CITRATE (PF) 100 MCG/2ML IJ SOLN: 25.0000 ug | INTRAMUSCULAR | Status: DC | PRN

## 2019-05-04 MED ORDER — GLYCOPYRROLATE 0.2 MG/ML IJ SOLN: 0.4000 mg | INTRAMUSCULAR | Status: DC | PRN

## 2019-05-04 MED ORDER — DEXTROSE 50 % IV SOLN: 1.0000 | INTRAVENOUS | Status: DC | PRN

## 2019-05-04 MED ORDER — NALOXONE HCL 0.4 MG/ML IJ SOLN
INTRAMUSCULAR | Status: AC
  Filled 2019-05-04: qty 1

## 2019-05-04 MED ORDER — ORAL CARE MOUTH RINSE: 15.0000 mL | Freq: Two times a day (BID) | OROMUCOSAL | Status: DC

## 2019-05-04 MED ORDER — DEXTROSE 50 % IV SOLN
INTRAVENOUS | Status: AC
  Administered 2019-05-04: 50 mL
  Filled 2019-05-04: qty 50

## 2019-05-04 MED FILL — Medication: Qty: 1 | Status: AC

## 2019-05-07 LAB — CULTURE, BLOOD (ROUTINE X 2)
Culture: NO GROWTH
Culture: NO GROWTH
Special Requests: ADEQUATE
Special Requests: ADEQUATE

## 2019-05-09 LAB — BLOOD GAS, ARTERIAL
Acid-Base Excess: 2.3 mmol/L — ABNORMAL HIGH (ref 0.0–2.0)
Bicarbonate: 26.2 mmol/L (ref 20.0–28.0)
Drawn by: 27011
FIO2: 21
O2 SAT: 90.2 %
Patient temperature: 98.6
pCO2 arterial: 39.6 mmHg (ref 32.0–48.0)
pH, Arterial: 7.436 (ref 7.350–7.450)
pO2, Arterial: 60.9 mmHg — ABNORMAL LOW (ref 83.0–108.0)

## 2019-05-11 ENCOUNTER — Ambulatory Visit: Payer: Medicare Other | Admitting: Neurology

## 2019-05-11 DIAGNOSIS — I469 Cardiac arrest, cause unspecified: Secondary | ICD-10-CM

## 2019-05-30 NOTE — Progress Notes (Signed)
Hypoglycemic Event  CBG: 42, checked after critical low from lab of 43  Treatment: D50 50 mL (25 gm)  Symptoms: None  Follow-up CBG: Time: 0059   CBG Result:114  Possible Reasons for Event: Patient NPO over 24 hours  Comments/MD notified: MD notified of lab value and that RN would follow hypoglycemic event protocol    Kara Dies

## 2019-05-30 NOTE — Progress Notes (Signed)
Rocuronium and Succinylcholine from RSI kit wasted in sharps d/t enteric precautions in place. Witnessed with Magda Paganini rapid response  Erin Hearing PharmD., BCPS Clinical Pharmacist 2019-05-07 11:13 AM

## 2019-05-30 NOTE — Progress Notes (Signed)
CRITICAL VALUE ALERT  Critical Value:  Lactic Acid 4.3  Date & Time Notied:  2019/05/18 @ 0152  Provider Notified: Tylene Fantasia  Orders Received/Actions taken: Will trend lactic acids

## 2019-05-30 NOTE — Progress Notes (Signed)
   05-18-19 1028  Vitals  ECG Heart Rate (!) 49  Resp 15  Art Line  Arterial Line BP 81/41  Arterial Line MAP (mmHg) 49 mmHg  MEWS Score  MEWS RR 0  MEWS Pulse 1  MEWS Systolic 1  MEWS LOC 0  MEWS Temp 0  MEWS Score 2  MEWS Score Color Comfort Care Only  code blue called

## 2019-05-30 NOTE — Progress Notes (Signed)
rec'd call from CMT pt's sats in 50's, went into room called code blue. Bagged and CPR started with help of Vinie Sill. Code team arrived. [place] care nurse called family and decide to transition to comfort care.

## 2019-05-30 NOTE — Procedures (Signed)
Arterial Catheter Insertion Procedure Note Robert Barnett [id number] 1932/05/22  Procedure: Insertion of Arterial Catheter  Indications: Blood pressure monitoring  Procedure Details Consent: Risks of procedure as well as the alternatives and risks of each were explained to the (patient/caregiver).  Consent for procedure obtained. Time Out: Verified patient identification, verified procedure, site/side was marked, verified correct patient position, special equipment/implants available, medications/allergies/relevent history reviewed, required imaging and test results available.  Performed  Maximum sterile technique was used including antiseptics, cap, gloves, gown, hand hygiene, mask and sheet. Skin prep: Chlorhexidine; local anesthetic administered 20 gauge catheter was inserted into left radial artery using the Seldinger technique. ULTRASOUND GUIDANCE USED: YES  Evaluation Blood flow good; BP tracing good. Complications: No apparent complications.   [place], [place] 2019-05-18

## 2019-05-30 NOTE — Progress Notes (Signed)
SLP Cancellation Note  Patient Details Name: Robert Barnett MRN: [id number] DOB: [birth year]   Cancelled treatment:       Reason Eval/Treat Not Completed: Medical issues which prohibited therapy. Arrived to see pt for PO trials. Code being called, pt being transferred to ICU. Will follow for readiness for SLP.    Venita Sheffield Vertie Dibbern May 25, 2019, 10:34 AM  Pollyann Glen, M.A. [place] Acute Environmental education officer [phone] Office [phone]

## 2019-05-30 NOTE — Progress Notes (Signed)
I attended Code blue for the patient. Patient was seen and examined during and after CODE BLUE.  Patient had significant bradycardia and had respiratory arrest.  He underwent CPR but the family refused intubation.  Subsequently comfort care was advised.  Patient passed away at 1106.  Death certificate filled in.

## 2019-05-30 NOTE — Death Summary Note (Signed)
Death Summary  Robert Barnett [place] FFM:[id number] DOB: [birth year] DOA: 2019/05/16  PCP: Janith Lima, MD PCP/Office notified: None  Admit date: 2019-05-16   Date of Death: 18-May-2019   Final Diagnoses:  Active Problems:   Acute kidney injury (HCC)   CAP (community acquired pneumonia)   Elevated liver enzymes   Sepsis with acute organ dysfunction (HCC)   Recurrent aspiration pneumonia ([place])   Vascular dementia without behavioral disturbance (HCC)   Enteritis due to Clostridium difficile   Hospital Course:   85 year old male with dementia and hx aspiration admitted for sepsis secondary to for pneumonia secondary to suspected aspiration and diarrhea. Recently had hospitalization for E. Coli diarrhea. Since home he has had poor PO intake and diarrhea. Per chart review and H&P, he had coughing spells at home while drinking liquids. Today, bedside RN reported patient had episode of hypoxemia to the 40s and then became bradycardic. No pulses palpated so ACLS was started. He was given Epi x 1 and achieved ROSC. Peri-intubation, family decided to not pursue further aggressive medical care so intubation was aborted. Primary team transitioned patient to comfort care.  Possible cause of death septic shock secondary to aspiration pneumonia.    Time: 1106  Signed:  [place]  [facility] May 18, 2019, 11:32 AM

## 2019-05-30 NOTE — Progress Notes (Addendum)
Palliative:  I was entering room as Mr. Robert Barnett was coding. No pulse and I initiated CPR. RN and PCCM took over and I called and spoke with Darlene. I updated Darlene that her father is dying and she tells me that she knows her father would not want to be on machines and the decision was made NOT to intubate. I was very clear that her father does not have much time left and encouraged family to come ASAP if they want to be with him before he passes. Emotional support provided.   At this time will make DNR and provide comfort measures with oxygen with hopes family will have time to be with him. Discussed visitation with AC.   40 min  Vinie Sill, NP Palliative Medicine Team Pager # [phone] (M-F 8a-5p) Team Phone # [phone] (Nights/Weekends)

## 2019-05-30 NOTE — Progress Notes (Signed)
Responded to Code Blue. AC Ron was in contact with family. PT was being treated by staff. I offered spiritual care with ministry of presence and silent prayer. PT being transferred.  Chaplain Fidel Levy  [phone]

## 2019-05-30 NOTE — Progress Notes (Signed)
Pharmacy Antibiotic Note  Robert Barnett is a 85 y.o. male admitted on 04/30/2019 with pneumonia.  Initially started on broad-spectrum ABX, currently on cefepime only.  Pharmacy has been consulted to resume vanc given hypotension.  Pt received vanc load ~24hr ago in setting of AKI on CKDIII.  Plan: Will hold off on additional vanc dose for now and give additional dose this am if SCr has significantly improved.  Height: 6\' 1"  (185.4 cm) Weight: 190 lb (86.2 kg) IBW/kg (Calculated) : 79.9  Temp (24hrs), Avg:98.1 F (36.7 C), Min:97.5 F (36.4 C), Max:99.3 F (37.4 C)  Recent Labs  Lab 05/22/2019 1849 05/03/19 0202 05/03/19 0536 05/03/19 2337  WBC 2.7*  --   --  2.4*  CREATININE 2.93*  --  2.71* 2.77*  LATICACIDVEN 2.5* 3.2*  --  4.3*    Estimated Creatinine Clearance: 22.4 mL/min (A) (by C-G formula based on SCr of 2.77 mg/dL (H)).    No Known Allergies   Thank you for allowing pharmacy to be a part of this patient's care.  Wynona Neat, PharmD, BCPS  05/19/19 2:06 AM

## 2019-05-30 NOTE — Consult Note (Signed)
NAME:  Robert Barnett, MRN:  [id number], DOB:  05/24/1932, LOS: 2 ADMISSION DATE:  05/28/2019, CONSULTATION DATE:  2019-05-26 CHIEF COMPLAINT:  Cardiac arrest  Brief History   85 year old male with dementia and hx aspiration admitted for sepsis secondary to aspiration. PCCM consulted for cardiac arrest.  History of present illness   85 year old male with dementia and hx aspiration admitted for sepsis secondary to for pneumonia secondary to suspected aspiration and diarrhea. Recently had hospitalization for E. Coli diarrhea. Since home he has had poor PO intake and diarrhea. Per chart review and H&P, he had coughing spells at home while drinking liquids. Today, bedside RN reported patient had episode of hypoxemia to the 40s and then became bradycardic. No pulses palpated so ACLS was started. He was given Epi x 1 and achieved ROSC. Peri-intubation, family decided to not pursue further aggressive medical care so intubation was aborted. Primary team transitioned patient to comfort care.  Past Medical History  Dementia Hx aspiration CVA CKD III  Procedures:  5/6 ACLS with ROSC  Significant Diagnostic Tests:    Micro Data:    Antimicrobials:    Interim history/subjective:  As above  Objective   Blood pressure (!) 98/43, pulse 77, temperature 97.6 F (36.4 C), temperature source Oral, resp. rate 20, height 6\' 1"  (1.854 m), weight 86.2 kg, SpO2 100 %.        Intake/Output Summary (Last 24 hours) at 2019-05-26 1056 Last data filed at May 26, 2019 0900 Gross per 24 hour  Intake 5529.7 ml  Output 138 ml  Net 5391.7 ml   Filed Weights   05/15/2019 1819  Weight: 86.2 kg   Physical Exam: General: Critically ill-appearing, respiratory distress HENT: [place], AT, OP clear, MMM Eyes: EOMI, no scleral icterus Respiratory: Agonal breathing Cardiovascular: Bradycardic, normotensive, weak pulse GI: BS+, soft, nontender Extremities:-Edema,-tenderness Neuro: Unresponsive GU: Foley in place   Assessment & Plan:  S/p cardiac arrest Likely secondary to hypoxemia. Became bradycardiac and lost pulse. Achieved ROSC. While planning to intubate, primary team and palliative contacted family. Decision was made to discontinue aggressive care including no intubation or further chest compressions.  -Patient DNR -No transfer to ICU  Best practice:   Code Status: Changed to DNR Family Communication: Per primary Disposition: No transfer to ICU  Labs   CBC: Recent Labs  Lab 05/25/2019 1849 05/03/19 2337  WBC 2.7* 2.4*  NEUTROABS 1.3* 1.5*  HGB 11.5* 16.2  HCT 34.3* 51.5  MCV 104.3* 113.2*  PLT 135* 58*    Basic Metabolic Panel: Recent Labs  Lab 05/23/2019 1849 05/03/19 0536 05/03/19 2337 05-26-2019 0515  NA 141 144 147* 146*  K 3.4* 3.9 4.9 4.1  CL 108 115* 121* 121*  CO2 20* 17* 11* 14*  GLUCOSE 79 87 43* 87  BUN 48* 46* 49* 50*  CREATININE 2.93* 2.71* 2.77* 2.90*  CALCIUM 7.9* 7.4* 7.1* 7.2*  MG  --   --  1.7  --    GFR: Estimated Creatinine Clearance: 21.4 mL/min (A) (by C-G formula based on SCr of 2.9 mg/dL (H)). Recent Labs  Lab 05/01/2019 1849 05/03/19 0202 05/03/19 2337 05-26-19 0515 05-26-19 0839  WBC 2.7*  --  2.4*  --   --   LATICACIDVEN 2.5* 3.2* 4.3* 2.6* 2.4*    Liver Function Tests: Recent Labs  Lab 05/18/2019 1849 05/03/19 0536 05/03/19 2337  AST 150* 125* 109*  ALT 136* 108* 91*  ALKPHOS 151* 123 106  BILITOT 1.5* 1.7* 1.7*  PROT  6.8 5.4* 5.1*  ALBUMIN 2.5* 1.9* 1.6*   No results for input(s): LIPASE, AMYLASE in the last 168 hours. No results for input(s): AMMONIA in the last 168 hours.  ABG    Component Value Date/Time   PHART 7.436 03/19/2019 1757   PCO2ART 39.6 03/19/2019 1757   PO2ART 60.9 (L) 03/19/2019 1757   HCO3 26.2 03/19/2019 1757   TCO2 23 09/02/2017 2245   O2SAT 90.2 03/19/2019 1757     Coagulation Profile: No results for input(s): INR, PROTIME in the last 168 hours.  Cardiac Enzymes: No results for input(s):  CKTOTAL, CKMB, CKMBINDEX, TROPONINI in the last 168 hours.  HbA1C: Hgb A1c MFr Bld  Date/Time Value Ref Range Status  07/23/2016 03:54 PM 5.6 4.6 - 6.5 % Final    Comment:    Glycemic Control Guidelines for People with Diabetes:Non Diabetic:  <6%Goal of Therapy: <7%Additional Action Suggested:  >8%   10/16/2007 03:25 AM   Final   6.1 (NOTE)   The ADA recommends the following therapeutic goals for glycemic   control related to Hgb A1C measurement:   Goal of Therapy:   < 7.0% Hgb A1C   Action Suggested:  > 8.0% Hgb A1C   Ref:  Diabetes Care, 22, Suppl. 1, 1999    CBG: Recent Labs  Lab 05/16/19 0046 05-16-19 0059 05-16-19 0430 05/16/19 0743  GLUCAP 42* 114* 80 89    Review of Systems:   Unable to obtain  Past Medical History  He,  has a past medical history of Arthritis, BPH (benign prostatic hyperplasia), Carotid artery occlusion, Dementia ([place]), Dysphagia, Hyperlipidemia, Hypertension, Peripheral vascular disease ([place]), Stroke ([place]), and Umbilical hernia.   Surgical History    Past Surgical History:  Procedure Laterality Date  . CAROTID ENDARTERECTOMY  10/06/2007   Right CEA by Dr. Amedeo Plenty  . IR CATHETER TUBE CHANGE  09/15/2017  . IR CATHETER TUBE CHANGE  12/03/2017  . IR CATHETER TUBE CHANGE  12/23/2017  . IR CATHETER TUBE CHANGE  05/07/2018  . IR CATHETER TUBE CHANGE  06/29/2018  . IR CATHETER TUBE CHANGE  08/18/2018  . IR CATHETER TUBE CHANGE  09/08/2018  . IR CATHETER TUBE CHANGE  10/15/2018  . IR CATHETER TUBE CHANGE  01/26/2019  . IR GENERIC HISTORICAL  07/09/2016   IR RADIOLOGIST EVAL & MGMT 07/09/2016 Arne Cleveland, MD [facility] INTERV RAD  . IR RADIOLOGIST EVAL & MGMT  09/15/2017  . IR RADIOLOGIST EVAL & MGMT  05/06/2018  . PEG TUBE PLACEMENT  2008   after CVA     Social History   reports that he has quit smoking. He has never used smokeless tobacco. He reports that he does not drink alcohol or use drugs.   Family History   His family history includes Obesity in his  daughter.   Allergies No Known Allergies   Home Medications  Prior to Admission medications   Medication Sig Start Date End Date Taking? Authorizing Provider  amLODipine (NORVASC) 5 MG tablet Take 1 tablet (5 mg total) by mouth daily. 04/18/19  Yes Janith Lima, MD  aspirin 81 MG tablet Take 1 tablet (81 mg total) by mouth daily. 06/14/18  Yes Janith Lima, MD  bismuth subsalicylate (PEPTO BISMOL) 262 MG/15ML suspension Take 30 mLs by mouth every 6 (six) hours as needed for indigestion.   Yes [provider]  clopidogrel (PLAVIX) 75 MG tablet TAKE 1 TABLET BY MOUTH EVERY DAY Patient taking differently: Take 75 mg by mouth daily.  04/12/19  Yes Janith Lima, MD  colchicine 0.6 MG tablet Take 0.6 mg by mouth 2 (two) times daily. 02/18/19  Yes [provider]  Diaper Rash Products (DESITIN) OINT Apply 1 application topically See admin instructions. Apply to buttocks after every bowel movement   Yes [provider]  feeding supplement, ENSURE ENLIVE, (ENSURE ENLIVE) LIQD Take 237 mLs by mouth 2 (two) times daily between meals. Patient taking differently: Take 237 mLs by mouth 3 (three) times daily between meals.  03/23/19  Yes Irene Pap N, DO  ferrous sulfate 325 (65 FE) MG EC tablet TAKE 1 TABLET BY MOUTH EVERY DAY WITH BREAKFAST Patient taking differently: Take 325 mg by mouth daily with breakfast.  12/08/18  Yes Janith Lima, MD  folic acid (FOLVITE) 1 MG tablet TAKE 1 TABLET BY MOUTH EVERY DAY Patient taking differently: Take 1 mg by mouth daily.  12/08/18  Yes Janith Lima, MD  levothyroxine (SYNTHROID, LEVOTHROID) 50 MCG tablet TAKE 1 TABLET (50 MCG TOTAL) BY MOUTH DAILY. 12/30/18  Yes Janith Lima, MD  loperamide (IMODIUM) 1 MG/5ML solution Take 6 mg by mouth as needed for diarrhea or loose stools.   Yes [provider]  simvastatin (ZOCOR) 20 MG tablet TAKE 1 TABLET BY MOUTH EVERY DAY Patient taking differently: Take 20 mg by mouth at  bedtime.  03/29/19  Yes Janith Lima, MD  thiamine 100 MG tablet TAKE 1 TABLET BY MOUTH EVERY DAY Patient taking differently: Take 100 mg by mouth daily.  01/03/19  Yes Janith Lima, MD  triamcinolone cream (KENALOG) 0.5 % Apply 1 application topically 2 (two) times daily. 03/31/18  Yes Janith Lima, MD  Hydrocortisone (GERHARDT'S BUTT CREAM) CREA Apply 1 application topically 2 (two) times daily. Patient not taking: Reported on 05/03/2019 03/23/19   Kayleen Memos, DO     Critical care time: 40 min    The patient is critically ill with multiple organ systems failure and requires high complexity decision making for assessment and support, frequent evaluation and titration of therapies, application of advanced monitoring technologies and extensive interpretation of multiple databases.   Critical Care Time devoted to patient care services described in this note is 40 Minutes. This time reflects time of care of this signee Dr. Rodman Pickle. This critical care time does not reflect procedure time, or teaching time or supervisory time of PA/NP/Med student/Med Resident etc but could involve care discussion time.  Rodman Pickle, M.D. [facility] Pulmonary/Critical Care Medicine Pager: [phone] After hours pager: [phone]

## 2019-05-30 DEATH — deceased

## 2019-07-10 ENCOUNTER — Other Ambulatory Visit: Payer: Self-pay | Admitting: Internal Medicine

## 2019-07-10 DIAGNOSIS — H539 Unspecified visual disturbance: Secondary | ICD-10-CM

## 2019-07-10 DIAGNOSIS — I6300 Cerebral infarction due to thrombosis of unspecified precerebral artery: Secondary | ICD-10-CM

## 2019-07-10 DIAGNOSIS — I69398 Other sequelae of cerebral infarction: Secondary | ICD-10-CM

## 2019-12-07 ENCOUNTER — Ambulatory Visit: Payer: Medicare Other | Admitting: Neurology
